# Patient Record
Sex: Female | Born: 1940 | Race: White | Hispanic: No | Smoking: Former smoker
Health system: Southern US, Community
[De-identification: ages and names within clinical notes are randomized; demographics above are authoritative.]

## PROBLEM LIST (undated history)

## (undated) DIAGNOSIS — I639 Cerebral infarction, unspecified: Secondary | ICD-10-CM

## (undated) DIAGNOSIS — Z923 Personal history of irradiation: Secondary | ICD-10-CM

## (undated) DIAGNOSIS — J189 Pneumonia, unspecified organism: Secondary | ICD-10-CM

## (undated) DIAGNOSIS — I10 Essential (primary) hypertension: Secondary | ICD-10-CM

## (undated) DIAGNOSIS — C3490 Malignant neoplasm of unspecified part of unspecified bronchus or lung: Secondary | ICD-10-CM

## (undated) DIAGNOSIS — Z9221 Personal history of antineoplastic chemotherapy: Secondary | ICD-10-CM

## (undated) HISTORY — PX: NASAL SINUS SURGERY: SHX719

## (undated) HISTORY — DX: Personal history of irradiation: Z92.3

## (undated) HISTORY — PX: PORTACATH PLACEMENT: SHX2246

## (undated) HISTORY — DX: Pneumonia, unspecified organism: J18.9

---

## 2000-01-30 ENCOUNTER — Emergency Department (HOSPITAL_COMMUNITY): Admission: EM | Admit: 2000-01-30 | Discharge: 2000-01-30 | Payer: Self-pay

## 2011-04-05 ENCOUNTER — Emergency Department (HOSPITAL_COMMUNITY)
Admission: EM | Admit: 2011-04-05 | Discharge: 2011-04-05 | Disposition: A | Discharge status: Home / Self Care | Payer: No Typology Code available for payment source | Attending: Emergency Medicine | Admitting: Emergency Medicine

## 2011-04-05 ENCOUNTER — Encounter (HOSPITAL_COMMUNITY): Payer: Self-pay | Admitting: *Deleted

## 2011-04-05 ENCOUNTER — Emergency Department (HOSPITAL_COMMUNITY): Payer: No Typology Code available for payment source

## 2011-04-05 DIAGNOSIS — Y9229 Other specified public building as the place of occurrence of the external cause: Secondary | ICD-10-CM | POA: Insufficient documentation

## 2011-04-05 DIAGNOSIS — W010XXA Fall on same level from slipping, tripping and stumbling without subsequent striking against object, initial encounter: Secondary | ICD-10-CM | POA: Insufficient documentation

## 2011-04-05 DIAGNOSIS — F17200 Nicotine dependence, unspecified, uncomplicated: Secondary | ICD-10-CM | POA: Insufficient documentation

## 2011-04-05 DIAGNOSIS — M25539 Pain in unspecified wrist: Secondary | ICD-10-CM | POA: Insufficient documentation

## 2011-04-05 DIAGNOSIS — S62109A Fracture of unspecified carpal bone, unspecified wrist, initial encounter for closed fracture: Secondary | ICD-10-CM | POA: Insufficient documentation

## 2011-04-05 DIAGNOSIS — S62102A Fracture of unspecified carpal bone, left wrist, initial encounter for closed fracture: Secondary | ICD-10-CM

## 2011-04-05 MED ORDER — ACETAMINOPHEN 500 MG PO TABS: 500.0000 mg | ORAL_TABLET | Freq: Four times a day (QID) | ORAL | Status: AC | PRN

## 2011-04-05 MED ORDER — ACETAMINOPHEN 325 MG PO TABS
650.0000 mg | ORAL_TABLET | Freq: Once | ORAL | Status: AC
  Administered 2011-04-05: 650 mg via ORAL
  Filled 2011-04-05: qty 2

## 2011-04-05 NOTE — ED Provider Notes (Signed)
History     CSN: [id number]  Arrival date & time 04/05/11  1249   First MD Initiated Contact with Patient 04/05/11 1402      Chief Complaint  Patient presents with  . Wrist Pain    (Consider location/radiation/quality/duration/timing/severity/associated sxs/prior treatment) HPI  71 year old female presents to the ED with chief complaints of left wrist injury. Patient states she was walking out [employer] today when she slipped and fell. She denies any prior sxs before falling.  Patient extended her left arm to brace her fall. She noticed immediate pain to the left wrist and notice deformity. She denies left elbow left shoulder pain. She denies hand pain. She denies hitting her head or loss of consciousness. She denies any other trauma.  History reviewed. No pertinent past medical history.  History reviewed. No pertinent past surgical history.  No family history on file.  History  Substance Use Topics  . Smoking status: Current Everyday Smoker  . Smokeless tobacco: Not on file  . Alcohol Use: No    OB History    Grav Para Term Preterm Abortions TAB SAB Ect Mult Living                  Review of Systems  All other systems reviewed and are negative.    Allergies  Review of patient's allergies indicates not on file.  Home Medications  No current outpatient prescriptions on file.  BP 155/90  Pulse 84  Temp(Src) 98.2 F (36.8 C) (Oral)  Resp 20  Ht 5\' 2"  (1.575 m)  Wt 122 lb (55.339 kg)  BMI 22.31 kg/m2  SpO2 99%  Physical Exam  Nursing note and vitals reviewed. Constitutional: She appears well-nourished. No distress.  HENT:  Head: Normocephalic and atraumatic.  Eyes: Conjunctivae are normal.  Neck: Neck supple.  Musculoskeletal:       Left wrist: Point tenderness to the radial aspect of wrist with obvious deformity. No evidence of skin tenting. Sensation is intact throughout.  Radial pulse palpable.  Left hand: Normal finger opposition. No tenderness to   Anatomical snuffbox. Sensation is intact throughout.  Normal L elbow and L shoulder.    ED Course  Procedures (including critical care time)  Labs Reviewed - No data to display No results found.   No diagnosis found.  No results found for this or any previous visit. Dg Wrist Complete Left  04/05/2011  *RADIOLOGY REPORT*  Clinical Data: Fall.  Pain and deformity.  LEFT WRIST - COMPLETE 3+ VIEW  Comparison: None.  Findings: There is a Colles' fracture of the distal radius.  There is ventral angulation with slight dorsal tilt of the distal radial articular surface.  There is also a fracture of the posterior lip of the distal radius.  There is a fracture of the ulnar styloid.  IMPRESSION: Colles' fractures of the distal radius and ulnar styloid.  Per CMS PQRS reporting requirements (PQRS Measure 24): Given the patient's age of greater than 50 and the fracture site (hip, distal radius, or spine), the patient should be tested for osteoporosis using DXA, and the appropriate treatment considered based on the DXA results.  Original Report Authenticated By: Thomasenia Sales, M.D.       MDM  Likely wrist fracture from [facility] injury, will obtain xray for further evaluation.  Tylenol given for pain.  No other injury noted.     3:12 PM  X-ray of left wrist shows evidence of a Colles' fractures of the distal radius and ulnar styloid.  Patient is neurovascularly intact. I discussed my attending, and we decided that an ulnar gutter splint is appropriate. Sling provided for comfort. Follow up instruction given. Patient was understanding and agrees with plan.    Fayrene Helper, PA-C 04/05/11 [phone]

## 2011-04-05 NOTE — ED Notes (Signed)
Pt states she was at [employer] and slip when she was coming out of the door. Pt states she is not sure what she fell on. Pt states she tried to catch her self with her wrist. Deformity noted to left wrist. Pt is able to move digits but can not grab anything. Pt has + radial pulse. No loc

## 2011-04-05 NOTE — ED Notes (Signed)
Patient transported to X-ray 

## 2011-04-05 NOTE — ED Notes (Signed)
Ortho called 

## 2011-04-05 NOTE — Discharge Instructions (Signed)
Please followup with Dr. Victorino Dike for further evaluation and management of your wrist fracture. Return sooner if the experiencing numbness to fingers, and uncontrolled pain.  Wrist Fracture Your caregiver has diagnosed you as having a fracture of the wrist. A fracture is a break in the bone or bones. A cast or splint is used to protect and keep your injured bone(s) from moving. The cast or splint will usually be on for about 5 to 6 weeks. One of the bones of the wrist (the navicular bone) often does not show up as a fracture on X-ray until later or in the healing phase. With this bone your caregiver will often cast as though it is fractured even if not seen on the X-ray. HOME CARE INSTRUCTIONS   To lessen the swelling, keep the injured part elevated while sitting or lying down. Keeping the injury above the level of your heart (the center of the chest) will decrease swelling and pain.   Do not wear rings or jewelry on the injured hand or wrist.   Apply ice to the injury for 15 to 20 minutes, 3 to 4 times per day while awake for 2 days. Put the ice in a plastic bag and place a thin towel between the bag of ice and your cast.   If you have a plaster or fiberglass cast:   Do not try to scratch the skin under the cast using sharp or pointed objects.   Check the skin around the cast every day. You may put lotion on any red or sore areas.   Keep your cast dry and clean.   If you have a plaster splint:   Wear the splint as directed.   You may loosen the elastic around the splint if your fingers become numb, tingle, or turn cold or blue.   If you have been put in a removable splint, wear and use as directed.   Do not use powders or deodorants in or around the cast or splint.   Do not remove padding from your cast or splint.   Do not put pressure on any part of your cast or splint. It may break. Rest your cast or splint only on a pillow the first 24 hours until it is fully hardened.   Gently  move your fingers often, so they do not get stiff.   Do not remove the splint unless directed by your caregiver. Casts must be removed by an orthopedist.   Your cast or splint can be protected during bathing with a plastic bag. Do not lower the cast or splint into water.   Only take over-the-counter or prescription medicines for pain, discomfort, or fever as directed by your caregiver.   Follow up with your caregiver as directed.  SEEK IMMEDIATE MEDICAL CARE IF:   Your cast or splint gets damaged or breaks.   Your cast or splint feels too tight or loose.   You have increased pain, not controlled with medication.   You have increased swelling.   Your skin or nails below the injury turn blue or grey or feel cold or numb.   You have trouble moving or feeling your fingers.   You experience any burning or stinging from the cast or splint.   There is a bad smell coming from under the cast or splint.   New stains or fluids are coming from under the cast or splint.   You have any new injuries while wearing the cast or splint.  Document  Released: 10/14/2004 Document Revised: 12/24/2010 Document Reviewed: 08/03/2006 [facility] Patient Information 2012 [place], [place].  Cast or Splint Care Casts and splints support injured limbs and keep bones from moving while they heal.  HOME CARE  Keep the cast or splint uncovered during the drying period.   A plaster cast can take 24 to 48 hours to dry.   A fiberglass cast will dry in less than 1 hour.   Do not rest the cast on anything harder than a pillow for 24 hours.   Do not put weight on your injured limb. Do not put pressure on the cast. Wait for your doctor's approval.   Keep the cast or splint dry.   Cover the cast or splint with a plastic bag during baths or wet weather.   If you have a cast over your chest and belly (trunk), take sponge baths until the cast is taken off.   Keep your cast or splint clean. Wash a dirty cast with a  damp cloth.   Do not put any objects under your cast or splint. Do not scratch the skin under the cast with an object.   Do not take out the padding from inside your cast.   Exercise your joints near the cast as told by your doctor.   Raise (elevate) your injured limb on 1 or 2 pillows for the first 1 to 3 days.  GET HELP RIGHT AWAY IF:  Your cast or splint cracks.   Your cast or splint is too tight or too loose.   You itch badly under the cast.   Your cast gets wet or has a soft spot.   You have a bad smell coming from the cast.   You get an object stuck under the cast.   Your skin around the cast becomes red or raw.   You have new or more pain after the cast is put on.   You have fluid leaking through the cast.   You cannot move your fingers or toes.   Your fingers or toes turn colors or are cool, painful, or puffy (swollen).   You have tingling or lose feeling (numbness) around the injured area.   You have pain or pressure under the cast.   You have trouble breathing or have shortness of breath.   You have chest pain.  MAKE SURE YOU:  Understand these instructions.   Will watch your condition.   Will get help right away if you are not doing well or get worse.  Document Released: 05/06/2010 Document Revised: 12/24/2010 Document Reviewed: 05/06/2010 [facility] Patient Information 2012 [place], [place].

## 2011-04-07 NOTE — ED Provider Notes (Signed)
Medical screening examination/treatment/procedure(s) were performed by non-physician practitioner and as supervising physician I was immediately available for consultation/collaboration.  Farah Benish T Allante Beane, MD 04/07/11 0930 

## 2012-06-23 ENCOUNTER — Institutional Professional Consult (permissible substitution): Payer: Medicare Other | Admitting: Internal Medicine

## 2012-06-27 ENCOUNTER — Ambulatory Visit (INDEPENDENT_AMBULATORY_CARE_PROVIDER_SITE_OTHER): Payer: Medicare Other | Admitting: Internal Medicine

## 2012-06-27 ENCOUNTER — Ambulatory Visit (INDEPENDENT_AMBULATORY_CARE_PROVIDER_SITE_OTHER)
Admission: RE | Admit: 2012-06-27 | Discharge: 2012-06-27 | Disposition: A | Discharge status: Home / Self Care | Payer: Medicare Other | Source: Ambulatory Visit | Attending: Internal Medicine | Admitting: Internal Medicine

## 2012-06-27 ENCOUNTER — Other Ambulatory Visit (INDEPENDENT_AMBULATORY_CARE_PROVIDER_SITE_OTHER): Payer: Medicare Other

## 2012-06-27 ENCOUNTER — Encounter: Payer: Self-pay | Admitting: Internal Medicine

## 2012-06-27 VITALS — BP 120/68 | HR 94 | Temp 97.7°F | Ht 63.0 in | Wt 117.4 lb

## 2012-06-27 DIAGNOSIS — J90 Pleural effusion, not elsewhere classified: Secondary | ICD-10-CM

## 2012-06-27 DIAGNOSIS — R918 Other nonspecific abnormal finding of lung field: Secondary | ICD-10-CM | POA: Insufficient documentation

## 2012-06-27 DIAGNOSIS — R222 Localized swelling, mass and lump, trunk: Secondary | ICD-10-CM

## 2012-06-27 LAB — BASIC METABOLIC PANEL
CO2: 25 mEq/L (ref 19–32)
Calcium: 9.1 mg/dL (ref 8.4–10.5)
Creatinine, Ser: 1 mg/dL (ref 0.4–1.2)
GFR: 55.88 mL/min — ABNORMAL LOW (ref >60.00)
Glucose, Bld: 94 mg/dL (ref 70–99)

## 2012-06-27 NOTE — Progress Notes (Signed)
  Subjective:    Patient ID: Cheryl Nielsen, female    DOB: 1940-07-04  MRN: [id number]  HPI  62 yowf quit smoking 04/2012 referred 06/27/2012 by Dr Leonides Sake for abn cxr   06/27/2012 1st pulmonary eval cc acutely ill last week in May 2014 with generalized cp more ant than post, not really pleuritic,  and sob x one flight of steps with wt loss x 8-10 lbs rx as pna with  levaquin >> nausea but improving.  No fever, no cough at any point.  No dysphagia or back pain  No obvious daytime variabilty or assoc chronic cough or cp or chest tightness, subjective wheeze overt sinus or hb symptoms. No unusual exp hx or h/o childhood pna/ asthma or premature birth to her knowledge.   Sleeping ok without nocturnal  or early am exacerbation  of respiratory  c/o's or need for noct saba. Also denies any obvious fluctuation of symptoms with weather or environmental changes or other aggravating or alleviating factors except as outlined above    Review of Systems  Constitutional: Positive for unexpected weight change. Negative for fever and chills.  HENT: Negative for ear pain, nosebleeds, congestion, sore throat, rhinorrhea, sneezing, trouble swallowing, dental problem, voice change, postnasal drip and sinus pressure.   Eyes: Negative for visual disturbance.  Respiratory: Positive for cough and shortness of breath. Negative for choking.   Cardiovascular: Positive for chest pain. Negative for leg swelling.  Gastrointestinal: Negative for vomiting, abdominal pain and diarrhea.  Genitourinary: Negative for difficulty urinating.  Musculoskeletal: Positive for arthralgias.  Skin: Negative for rash.  Neurological: Negative for tremors, syncope and headaches.  Hematological: Does not bruise/bleed easily.       Objective:   Physical Exam  amb wf nad Wt Readings from Last 3 Encounters:  06/27/12 117 lb 6.4 oz (53.252 kg)  04/05/11 122 lb (55.339 kg)   . HEENT mild turbinate edema.  Oropharynx no thrush or  excess pnd or cobblestoning.  No JVD or cervical adenopathy. Mild accessory muscle hypertrophy. Trachea midline, nl thryroid. Chest was hyperinflated by percussion with diminished breath sounds esp on L with dullness L base and moderate increased exp time without localized  wheeze. Hoover sign positive at mid inspiration. Regular rate and rhythm without murmur gallop or rub or increase P2 or edema.  Abd: no hsm, nl excursion. Ext warm without cyanosis / moderate bilateral clubbing.    CXR  06/27/2012 :   Near-complete opacification of the left hemithorax with mild  mediastinal shift to the left       Assessment & Plan:

## 2012-06-27 NOTE — Assessment & Plan Note (Signed)
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

## 2012-06-27 NOTE — Patient Instructions (Addendum)
Please remember to go to the lab and x-ray department downstairs for your tests - we will call you with the results when they are available.      

## 2012-06-28 ENCOUNTER — Telehealth: Payer: Self-pay | Admitting: Internal Medicine

## 2012-06-28 LAB — SEDIMENTATION RATE: Sed Rate: 24 mm/hr — ABNORMAL HIGH (ref 0–22)

## 2012-06-28 NOTE — Telephone Encounter (Signed)
MW has already discuss results with pt. Will sign off message

## 2012-06-30 ENCOUNTER — Encounter (HOSPITAL_COMMUNITY): Payer: Medicare Other

## 2012-07-04 ENCOUNTER — Encounter (HOSPITAL_COMMUNITY): Payer: Self-pay

## 2012-07-05 ENCOUNTER — Ambulatory Visit (HOSPITAL_COMMUNITY)
Admission: RE | Admit: 2012-07-05 | Discharge: 2012-07-05 | Disposition: A | Discharge status: Home / Self Care | Payer: Medicare Other | Source: Ambulatory Visit | Attending: Internal Medicine | Admitting: Internal Medicine

## 2012-07-05 ENCOUNTER — Encounter (HOSPITAL_COMMUNITY)
Admission: RE | Disposition: A | Discharge status: Home / Self Care | Payer: Self-pay | Source: Ambulatory Visit | Attending: Internal Medicine

## 2012-07-05 DIAGNOSIS — C3490 Malignant neoplasm of unspecified part of unspecified bronchus or lung: Secondary | ICD-10-CM

## 2012-07-05 DIAGNOSIS — R918 Other nonspecific abnormal finding of lung field: Secondary | ICD-10-CM

## 2012-07-05 DIAGNOSIS — R222 Localized swelling, mass and lump, trunk: Secondary | ICD-10-CM

## 2012-07-05 HISTORY — DX: Malignant neoplasm of unspecified part of unspecified bronchus or lung: C34.90

## 2012-07-05 HISTORY — PX: VIDEO BRONCHOSCOPY: SHX5072

## 2012-07-05 SURGERY — VIDEO BRONCHOSCOPY WITHOUT FLUORO
Anesthesia: Moderate Sedation | Laterality: Bilateral

## 2012-07-05 MED ORDER — MIDAZOLAM HCL 10 MG/2ML IJ SOLN
INTRAMUSCULAR | Status: AC
  Filled 2012-07-05: qty 4

## 2012-07-05 MED ORDER — MEPERIDINE HCL 100 MG/ML IJ SOLN
INTRAMUSCULAR | Status: AC
  Filled 2012-07-05: qty 2

## 2012-07-05 MED ORDER — MIDAZOLAM HCL 10 MG/2ML IJ SOLN
INTRAMUSCULAR | Status: DC | PRN
  Administered 2012-07-05: 5 mg via INTRAVENOUS
  Administered 2012-07-05: 2.5 mg via INTRAVENOUS

## 2012-07-05 MED ORDER — PHENYLEPHRINE HCL 0.25 % NA SOLN
NASAL | Status: DC | PRN
  Administered 2012-07-05: 2 via NASAL

## 2012-07-05 MED ORDER — LIDOCAINE HCL 2 % EX GEL
CUTANEOUS | Status: DC | PRN
  Administered 2012-07-05: 1

## 2012-07-05 MED ORDER — PHENYLEPHRINE HCL 0.25 % NA SOLN
1.0000 | Freq: Four times a day (QID) | NASAL | Status: DC | PRN
  Filled 2012-07-05: qty 15

## 2012-07-05 MED ORDER — LIDOCAINE HCL 2 % EX GEL
Freq: Once | CUTANEOUS | Status: DC
  Filled 2012-07-05: qty 5

## 2012-07-05 MED ORDER — MEPERIDINE HCL 25 MG/ML IJ SOLN
INTRAMUSCULAR | Status: DC | PRN
  Administered 2012-07-05: 25 mg via INTRAVENOUS

## 2012-07-05 MED ORDER — LIDOCAINE HCL (PF) 1 % IJ SOLN
INTRAMUSCULAR | Status: DC | PRN
  Administered 2012-07-05: 5 mL

## 2012-07-05 NOTE — OR Nursing (Signed)
Pt is in endoscopy recovery after bronch with Dr. Sherene Sires.  O2 sats were 94% on room air on admission.  Sats are now 88-90% ra in recovery, pt alert, denies difficulty breathing.  Called Dr. Sherene Sires, stated it was okay to send pt home with these o2 sats.  Sats at 91% ra upon discharge.  Ennis Forts, RN

## 2012-07-05 NOTE — H&P (Signed)
  71 yowf quit smoking 04/2012 referred 06/27/2012 by Dr Leonides Sake for abn cxr     06/27/2012 1st pulmonary eval cc acutely ill last week in May 2014 with generalized cp more ant than post, not really pleuritic,  and sob x one flight of steps with wt loss x 8-10 lbs rx as pna with  levaquin >> nausea but improving.   No fever, no cough at any point.  No dysphagia or back pain   No obvious daytime variabilty or assoc chronic cough or cp or chest tightness, subjective wheeze overt sinus or hb symptoms. No unusual exp hx or h/o childhood pna/ asthma or premature birth to her knowledge.    Sleeping ok without nocturnal  or early am exacerbation  of respiratory  c/o's or need for noct saba. Also denies any obvious fluctuation of symptoms with weather or environmental changes or other aggravating or alleviating factors except as outlined above      Review of Systems  Constitutional: Positive for unexpected weight change. Negative for fever and chills.  HENT: Negative for ear pain, nosebleeds, congestion, sore throat, rhinorrhea, sneezing, trouble swallowing, dental problem, voice change, postnasal drip and sinus pressure.   Eyes: Negative for visual disturbance.  Respiratory: Positive for cough and shortness of breath. Negative for choking.   Cardiovascular: Positive for chest pain. Negative for leg swelling.  Gastrointestinal: Negative for vomiting, abdominal pain and diarrhea.  Genitourinary: Negative for difficulty urinating.  Musculoskeletal: Positive for arthralgias.  Skin: Negative for rash.  Neurological: Negative for tremors, syncope and headaches.  Hematological: Does not bruise/bleed easily.          Objective:     Physical Exam   amb wf nad Wt Readings from Last 3 Encounters:   06/27/12  117 lb 6.4 oz (53.252 kg)   04/05/11  122 lb (55.339 kg)     . HEENT mild turbinate edema.  Oropharynx no thrush or excess pnd or cobblestoning.  No JVD or cervical adenopathy.  Mild accessory muscle hypertrophy. Trachea midline, nl thryroid. Chest was hyperinflated by percussion with diminished breath sounds esp on L with dullness L base and moderate increased exp time without localized  wheeze. Hoover sign positive at mid inspiration. Regular rate and rhythm without murmur gallop or rub or increase P2 or edema.  Abd: no hsm, nl excursion. Ext warm without cyanosis / moderate bilateral clubbing.     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   07/05/2012 day of bronchoscopy: no change in hx or exam    Sandrea Hughs, MD Pulmonary and Critical Care Medicine [facility] Cell [phone] After 5:30 PM or weekends, call [phone]

## 2012-07-05 NOTE — Op Note (Signed)
Bronchoscopy Procedure Note  Date of Operation: 07/05/2012   Pre-op Diagnosis: lung mass  Post-op Diagnosis: lung mass  Surgeon: Sandrea Hughs  Anesthesia: Monitored Local Anesthesia with Sedation  Operation: Video Flexible fiberoptic bronchoscopy, diagnostic   Findings: Cobblestoning, narrowing LMSB at carina with 75% obst  Specimen: washings,   Estimated Blood Loss: min  Complications: None  Indications and History: See updated H and P same date. The risks, benefits, complications, treatment options and expected outcomes were discussed with the patient.  The possibilities of reaction to medication, pulmonary aspiration, perforation of a viscus, bleeding, failure to diagnose a condition and creating a complication requiring transfusion or operation were discussed with the patient who freely signed the consent.    Description of Procedure: The patient was re-examined in the bronchoscopy suite. The patient was identified  and the procedure verified as Flexible Fiberoptic Bronchoscopy.  A Time Out was held and the above information confirmed.   After the induction of topical nasopharyngeal anesthesia, the patient was positioned  and the bronchoscope was passed through the R naris. The vocal cords were visualized and  1% buffered lidocaine 5 ml was topically placed onto the cords. The cords were nl. The scope was then passed into the trachea.  1% buffered lidocaine given topically. Airways inspected bilaterally to the subsegmental level with the following findings:  Trachea was nl x at level of carina where there was cobblestoning esp anteriorly assoc with definite widening.  This mucosal abn extendended into the LMSB and obstructed it 75% so that the scope could not easily be passed distally.  The Right sided airways were nl   Procedures: Bronchial washings, LMSB Endobronchial Bxs, LMSB     The Patient was taken to the Endoscopy Recovery area in satisfactory  condition.  Attestation: I performed the procedure.  Sandrea Hughs, MD Pulmonary and Critical Care Medicine [facility] Cell [phone] After 5:30 PM or weekends, call [phone]

## 2012-07-05 NOTE — Progress Notes (Signed)
Bronchoscopy performed with intervention BAL and intervention Biopsy

## 2012-07-06 ENCOUNTER — Telehealth: Payer: Self-pay | Admitting: Internal Medicine

## 2012-07-06 ENCOUNTER — Encounter (HOSPITAL_COMMUNITY): Payer: Self-pay | Admitting: Internal Medicine

## 2012-07-06 NOTE — Telephone Encounter (Signed)
Pt is requesting bronch results. Please advise MW thanks

## 2012-07-06 NOTE — Telephone Encounter (Signed)
Discussed with daughter, prelim is Small cell but final path pending and I will call and arrange onc eval when done

## 2012-07-07 ENCOUNTER — Other Ambulatory Visit: Payer: Self-pay | Admitting: Internal Medicine

## 2012-07-07 ENCOUNTER — Telehealth: Payer: Self-pay | Admitting: Internal Medicine

## 2012-07-07 ENCOUNTER — Encounter: Payer: Self-pay | Admitting: Internal Medicine

## 2012-07-07 ENCOUNTER — Telehealth: Payer: Self-pay | Admitting: *Deleted

## 2012-07-07 DIAGNOSIS — R918 Other nonspecific abnormal finding of lung field: Secondary | ICD-10-CM

## 2012-07-07 NOTE — Telephone Encounter (Signed)
Called left vm message regarding appt 07/10/12 3:30 labs, 4:00 Dr. Arbutus Ped

## 2012-07-07 NOTE — Telephone Encounter (Signed)
I spoke with daughter and she stated MW has already called her. Nothing further was needed

## 2012-07-07 NOTE — Telephone Encounter (Signed)
Pt's daughter returned call. Kathleen W Perdue °

## 2012-07-07 NOTE — Telephone Encounter (Signed)
LMOM x 1 

## 2012-07-10 ENCOUNTER — Encounter: Payer: Self-pay | Admitting: Internal Medicine

## 2012-07-10 ENCOUNTER — Ambulatory Visit (HOSPITAL_BASED_OUTPATIENT_CLINIC_OR_DEPARTMENT_OTHER): Payer: Medicare Other | Admitting: Internal Medicine

## 2012-07-10 ENCOUNTER — Other Ambulatory Visit (HOSPITAL_BASED_OUTPATIENT_CLINIC_OR_DEPARTMENT_OTHER): Payer: Medicare Other | Admitting: Lab

## 2012-07-10 ENCOUNTER — Ambulatory Visit: Payer: Medicare Other

## 2012-07-10 ENCOUNTER — Telehealth: Payer: Self-pay | Admitting: *Deleted

## 2012-07-10 VITALS — BP 155/82 | HR 80 | Temp 97.9°F | Resp 19 | Ht 63.0 in | Wt 114.9 lb

## 2012-07-10 DIAGNOSIS — C3400 Malignant neoplasm of unspecified main bronchus: Secondary | ICD-10-CM

## 2012-07-10 DIAGNOSIS — C3490 Malignant neoplasm of unspecified part of unspecified bronchus or lung: Secondary | ICD-10-CM

## 2012-07-10 LAB — CBC WITH DIFFERENTIAL/PLATELET
Basophils Absolute: 0.1 10*3/uL (ref 0.0–0.1)
Eosinophils Absolute: 0.1 10*3/uL (ref 0.0–0.5)
HCT: 39 % (ref 34.8–46.6)
LYMPH%: 13.9 % — ABNORMAL LOW (ref 14.0–49.7)
MONO#: 0.6 10*3/uL (ref 0.1–0.9)
NEUT#: 6 10*3/uL (ref 1.5–6.5)
NEUT%: 76.8 % (ref 38.4–76.8)
Platelets: 317 10*3/uL (ref 145–400)
WBC: 7.9 10*3/uL (ref 3.9–10.3)

## 2012-07-10 LAB — COMPREHENSIVE METABOLIC PANEL (CC13)
BUN: 12 mg/dL (ref 7.0–26.0)
CO2: 28 mEq/L (ref 22–29)
Creatinine: 0.9 mg/dL (ref 0.6–1.1)
Glucose: 134 mg/dl — ABNORMAL HIGH (ref 70–99)
Total Bilirubin: 0.32 mg/dL (ref 0.20–1.20)
Total Protein: 6.7 g/dL (ref 6.4–8.3)

## 2012-07-10 MED ORDER — LIDOCAINE-PRILOCAINE 2.5-2.5 % EX CREA: TOPICAL_CREAM | CUTANEOUS | Status: DC | PRN

## 2012-07-10 MED ORDER — PROCHLORPERAZINE MALEATE 10 MG PO TABS
10.0000 mg | ORAL_TABLET | Freq: Four times a day (QID) | ORAL | Status: DC | PRN
Start: 2012-07-10 — End: 2012-07-11

## 2012-07-10 NOTE — Progress Notes (Signed)
Checked in new patient. No financial issues. Didn't ask about POA/living will. Wants email/phone/mail for communication .

## 2012-07-10 NOTE — Patient Instructions (Signed)
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 We discussed treatment options including systemic chemotherapy with carboplatin and etoposide. First dose next week.
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
## 2012-07-10 NOTE — Telephone Encounter (Signed)
Pt has appt 6/23 at 330  will close note and send as fyi to dr wert.

## 2012-07-10 NOTE — Telephone Encounter (Signed)
Pt verbalized understanding of appt time and place for today

## 2012-07-11 ENCOUNTER — Telehealth: Payer: Self-pay | Admitting: *Deleted

## 2012-07-11 ENCOUNTER — Telehealth (HOSPITAL_COMMUNITY): Payer: Self-pay | Admitting: *Deleted

## 2012-07-11 ENCOUNTER — Encounter (HOSPITAL_COMMUNITY): Payer: Self-pay | Admitting: Pharmacy Technician

## 2012-07-11 ENCOUNTER — Telehealth: Payer: Self-pay | Admitting: Internal Medicine

## 2012-07-11 ENCOUNTER — Other Ambulatory Visit: Payer: Self-pay | Admitting: Radiology

## 2012-07-11 NOTE — Telephone Encounter (Signed)
Per staff message and POF I have scheduled appts.  JMW  

## 2012-07-11 NOTE — Telephone Encounter (Signed)
s.w. pt daughter and advised on edu class and to pick up sched.Marland KitchenMarland KitchenIR called and sched appt with pt....pt ok and aware...MW added tx.Marland KitchenMarland KitchenMarland Kitchen

## 2012-07-11 NOTE — Telephone Encounter (Signed)
Per staff message I have adjusted appt for 7/1.  JMW

## 2012-07-12 ENCOUNTER — Other Ambulatory Visit: Payer: Medicare Other

## 2012-07-12 ENCOUNTER — Ambulatory Visit (HOSPITAL_COMMUNITY)
Admission: RE | Admit: 2012-07-12 | Discharge: 2012-07-12 | Disposition: A | Discharge status: Home / Self Care | Payer: Medicare Other | Source: Ambulatory Visit | Attending: Internal Medicine | Admitting: Internal Medicine

## 2012-07-12 ENCOUNTER — Other Ambulatory Visit: Payer: Self-pay | Admitting: Internal Medicine

## 2012-07-12 ENCOUNTER — Encounter: Payer: Self-pay | Admitting: *Deleted

## 2012-07-12 ENCOUNTER — Encounter (HOSPITAL_COMMUNITY): Payer: Self-pay

## 2012-07-12 DIAGNOSIS — C3490 Malignant neoplasm of unspecified part of unspecified bronchus or lung: Secondary | ICD-10-CM

## 2012-07-12 MED ORDER — CEFAZOLIN SODIUM-DEXTROSE 2-3 GM-% IV SOLR
2.0000 g | Freq: Once | INTRAVENOUS | Status: AC
  Administered 2012-07-12: 2 g via INTRAVENOUS
  Filled 2012-07-12: qty 50

## 2012-07-12 MED ORDER — ONDANSETRON HCL 4 MG/2ML IJ SOLN
INTRAMUSCULAR | Status: AC
  Filled 2012-07-12: qty 2

## 2012-07-12 MED ORDER — SODIUM CHLORIDE 0.9 % IV SOLN
INTRAVENOUS | Status: DC
  Administered 2012-07-12: 12:00:00 via INTRAVENOUS

## 2012-07-12 MED ORDER — MIDAZOLAM HCL 2 MG/2ML IJ SOLN
INTRAMUSCULAR | Status: AC
  Filled 2012-07-12: qty 6

## 2012-07-12 MED ORDER — PROCHLORPERAZINE EDISYLATE 5 MG/ML IJ SOLN
5.0000 mg | Freq: Once | INTRAMUSCULAR | Status: AC
  Administered 2012-07-12: 5 mg via INTRAVENOUS
  Filled 2012-07-12: qty 1

## 2012-07-12 MED ORDER — FENTANYL CITRATE 0.05 MG/ML IJ SOLN
INTRAMUSCULAR | Status: AC | PRN
  Administered 2012-07-12: 50 ug via INTRAVENOUS
  Administered 2012-07-12: 25 ug via INTRAVENOUS

## 2012-07-12 MED ORDER — FENTANYL CITRATE 0.05 MG/ML IJ SOLN
INTRAMUSCULAR | Status: AC
  Filled 2012-07-12: qty 6

## 2012-07-12 MED ORDER — MIDAZOLAM HCL 2 MG/2ML IJ SOLN
INTRAMUSCULAR | Status: AC | PRN
  Administered 2012-07-12: 0.5 mg via INTRAVENOUS
  Administered 2012-07-12: 1 mg via INTRAVENOUS

## 2012-07-12 NOTE — Procedures (Signed)
Successful placement of right IJ approach port-a-cath with tip at the superior caval atrial junction. The catheter is ready for immediate use. No immediate post procedural complications. 

## 2012-07-12 NOTE — H&P (Signed)
Cheryl Nielsen is an 72 y.o. female.   Chief Complaint: New dx lung ca Scheduled for port a cath placement  HPI: smoker- quit recently  Past Medical History  Diagnosis Date  . Pneumonia, organism unspecified     Past Surgical History  Procedure Laterality Date  . Nasal sinus surgery    . Video bronchoscopy Bilateral 07/05/2012    Procedure: VIDEO BRONCHOSCOPY WITHOUT FLUORO;  Surgeon: Nyoka Cowden, MD;  Location: [facility];  Service: Cardiopulmonary;  Laterality: Bilateral;    Family History  Problem Relation Age of Onset  . Heart disease Mother   . Heart disease Father   . Prostate cancer Father   . Lung cancer Mother     was a smoker   Social History:  reports that she quit smoking about 2 months ago. Her smoking use included Cigarettes. She has a 40 pack-year smoking history. She has never used smokeless tobacco. She reports that she does not drink alcohol or use illicit drugs.  Allergies:  Allergies  Allergen Reactions  . Asa (Aspirin)     GI upset  . Codeine     syncope     (Not in a hospital admission)  Results for orders placed in visit on 07/10/12 (from the past 48 hour(s))  CBC WITH DIFFERENTIAL     Status: Abnormal   Collection Time    07/10/12  3:44 PM      Result Value Range   WBC 7.9  3.9 - 10.3 10e3/uL   NEUT# 6.0  1.5 - 6.5 10e3/uL   HGB 13.0  11.6 - 15.9 g/dL   HCT 16.1  09.6 - 04.5 %   Platelets 317  145 - 400 10e3/uL   MCV 80.0  79.5 - 101.0 fL   MCH 26.6  25.1 - 34.0 pg   MCHC 33.3  31.5 - 36.0 g/dL   RBC 4.09  8.11 - 9.14 10e6/uL   RDW 15.1 (*) 11.2 - 14.5 %   lymph# 1.1  0.9 - 3.3 10e3/uL   MONO# 0.6  0.1 - 0.9 10e3/uL   Eosinophils Absolute 0.1  0.0 - 0.5 10e3/uL   Basophils Absolute 0.1  0.0 - 0.1 10e3/uL   NEUT% 76.8  38.4 - 76.8 %   LYMPH% 13.9 (*) 14.0 - 49.7 %   MONO% 7.8  0.0 - 14.0 %   EOS% 0.7  0.0 - 7.0 %   BASO% 0.8  0.0 - 2.0 %  COMPREHENSIVE METABOLIC PANEL (CC13)     Status: Abnormal   Collection Time    07/10/12   3:44 PM      Result Value Range   Sodium 130 (*) 136 - 145 mEq/L   Potassium 4.0  3.5 - 5.1 mEq/L   Chloride 93 (*) 98 - 107 mEq/L   CO2 28  22 - 29 mEq/L   Glucose 134 (*) 70 - 99 mg/dl   BUN 78.2  7.0 - 95.6 mg/dL   Creatinine 0.9  0.6 - 1.1 mg/dL   Total Bilirubin 2.13  0.20 - 1.20 mg/dL   Alkaline Phosphatase 94  40 - 150 U/L   AST 15  5 - 34 U/L   ALT 13  0 - 55 U/L   Total Protein 6.7  6.4 - 8.3 g/dL   Albumin 3.0 (*) 3.5 - 5.0 g/dL   Calcium 9.1  8.4 - 08.6 mg/dL   No results found.  Review of Systems  Constitutional: Positive for weight loss. Negative for fever.  Respiratory: Positive for shortness of breath.   Cardiovascular: Negative for chest pain.  Gastrointestinal: Negative for nausea, vomiting and abdominal pain.  Neurological: Negative for weakness and headaches.    There were no vitals taken for this visit. Physical Exam  Constitutional: She is oriented to person, place, and time.  Cardiovascular: Normal rate, regular rhythm and normal heart sounds.   No murmur heard. Respiratory: Effort normal and breath sounds normal. She has no wheezes.  GI: Soft. Bowel sounds are normal. There is no tenderness.  Musculoskeletal: Normal range of motion.  Neurological: She is alert and oriented to person, place, and time.  Skin: Skin is warm and dry.  Psychiatric: She has a normal mood and affect. Her behavior is normal. Judgment and thought content normal.     Assessment/Plan New dx lung ca Scheduled for [facility] placement Pt aware of procedure benefits and risks and agreeable to proceed Consent signed and in chart  Cheryl Nielsen A 07/12/2012, 12:46 PM

## 2012-07-14 ENCOUNTER — Encounter (HOSPITAL_COMMUNITY): Payer: Self-pay

## 2012-07-14 ENCOUNTER — Ambulatory Visit (HOSPITAL_COMMUNITY)
Admission: RE | Admit: 2012-07-14 | Discharge: 2012-07-14 | Disposition: A | Discharge status: Home / Self Care | Payer: Medicare Other | Source: Ambulatory Visit | Attending: Internal Medicine | Admitting: Internal Medicine

## 2012-07-14 ENCOUNTER — Telehealth: Payer: Self-pay | Admitting: Medical Oncology

## 2012-07-14 DIAGNOSIS — J438 Other emphysema: Secondary | ICD-10-CM | POA: Insufficient documentation

## 2012-07-14 DIAGNOSIS — E279 Disorder of adrenal gland, unspecified: Secondary | ICD-10-CM | POA: Insufficient documentation

## 2012-07-14 DIAGNOSIS — I319 Disease of pericardium, unspecified: Secondary | ICD-10-CM | POA: Insufficient documentation

## 2012-07-14 DIAGNOSIS — C7931 Secondary malignant neoplasm of brain: Secondary | ICD-10-CM | POA: Insufficient documentation

## 2012-07-14 DIAGNOSIS — C3490 Malignant neoplasm of unspecified part of unspecified bronchus or lung: Secondary | ICD-10-CM | POA: Insufficient documentation

## 2012-07-14 DIAGNOSIS — J90 Pleural effusion, not elsewhere classified: Secondary | ICD-10-CM | POA: Insufficient documentation

## 2012-07-14 DIAGNOSIS — C7949 Secondary malignant neoplasm of other parts of nervous system: Secondary | ICD-10-CM | POA: Insufficient documentation

## 2012-07-14 DIAGNOSIS — N281 Cyst of kidney, acquired: Secondary | ICD-10-CM | POA: Insufficient documentation

## 2012-07-14 DIAGNOSIS — R599 Enlarged lymph nodes, unspecified: Secondary | ICD-10-CM | POA: Insufficient documentation

## 2012-07-14 DIAGNOSIS — C787 Secondary malignant neoplasm of liver and intrahepatic bile duct: Secondary | ICD-10-CM | POA: Insufficient documentation

## 2012-07-14 DIAGNOSIS — M899 Disorder of bone, unspecified: Secondary | ICD-10-CM | POA: Insufficient documentation

## 2012-07-14 DIAGNOSIS — J392 Other diseases of pharynx: Secondary | ICD-10-CM | POA: Insufficient documentation

## 2012-07-14 DIAGNOSIS — G936 Cerebral edema: Secondary | ICD-10-CM | POA: Insufficient documentation

## 2012-07-14 MED ORDER — IOHEXOL 300 MG/ML  SOLN
80.0000 mL | Freq: Once | INTRAMUSCULAR | Status: AC | PRN
  Administered 2012-07-14: 80 mL via INTRAVENOUS

## 2012-07-14 MED ORDER — GADOBENATE DIMEGLUMINE 529 MG/ML IV SOLN
10.0000 mL | Freq: Once | INTRAVENOUS | Status: AC | PRN
  Administered 2012-07-14: 10 mL via INTRAVENOUS

## 2012-07-14 NOTE — Telephone Encounter (Signed)
Daughter called to report pt is having scans tonight and wants Dr Arbutus Ped to call the results to her on Monday. I told [name] that if there are critical results that the pt may get a call from the on call Provider. [name] said to please call her with the results and not her mom -I confirmed this with the patient .

## 2012-07-17 ENCOUNTER — Telehealth: Payer: Self-pay | Admitting: *Deleted

## 2012-07-17 NOTE — Telephone Encounter (Signed)
Pt's daughter Andreas Blower called wanting to know the MRI of the brain and CT results.  Informed Dr Donnald Garre, per Dr Donnald Garre, he will call [name] with the results.  SLJ

## 2012-07-18 ENCOUNTER — Encounter: Payer: Self-pay | Admitting: Internal Medicine

## 2012-07-18 ENCOUNTER — Other Ambulatory Visit (HOSPITAL_BASED_OUTPATIENT_CLINIC_OR_DEPARTMENT_OTHER): Payer: Medicare Other | Admitting: Lab

## 2012-07-18 ENCOUNTER — Telehealth: Payer: Self-pay | Admitting: *Deleted

## 2012-07-18 ENCOUNTER — Telehealth: Payer: Self-pay | Admitting: Internal Medicine

## 2012-07-18 ENCOUNTER — Encounter: Payer: Self-pay | Admitting: Radiation Oncology

## 2012-07-18 ENCOUNTER — Ambulatory Visit (HOSPITAL_BASED_OUTPATIENT_CLINIC_OR_DEPARTMENT_OTHER): Payer: Medicare Other | Admitting: Internal Medicine

## 2012-07-18 ENCOUNTER — Ambulatory Visit (HOSPITAL_BASED_OUTPATIENT_CLINIC_OR_DEPARTMENT_OTHER): Payer: Medicare Other

## 2012-07-18 VITALS — BP 149/86 | HR 85 | Temp 97.2°F | Resp 20 | Ht 62.0 in | Wt 113.2 lb

## 2012-07-18 DIAGNOSIS — C3490 Malignant neoplasm of unspecified part of unspecified bronchus or lung: Secondary | ICD-10-CM

## 2012-07-18 DIAGNOSIS — C787 Secondary malignant neoplasm of liver and intrahepatic bile duct: Secondary | ICD-10-CM

## 2012-07-18 DIAGNOSIS — Z5111 Encounter for antineoplastic chemotherapy: Secondary | ICD-10-CM

## 2012-07-18 DIAGNOSIS — C7949 Secondary malignant neoplasm of other parts of nervous system: Secondary | ICD-10-CM

## 2012-07-18 DIAGNOSIS — C7931 Secondary malignant neoplasm of brain: Secondary | ICD-10-CM

## 2012-07-18 DIAGNOSIS — C3492 Malignant neoplasm of unspecified part of left bronchus or lung: Secondary | ICD-10-CM

## 2012-07-18 DIAGNOSIS — J90 Pleural effusion, not elsewhere classified: Secondary | ICD-10-CM

## 2012-07-18 LAB — CBC WITH DIFFERENTIAL/PLATELET
Eosinophils Absolute: 0.1 10*3/uL (ref 0.0–0.5)
LYMPH%: 17.1 % (ref 14.0–49.7)
MONO#: 1 10*3/uL — ABNORMAL HIGH (ref 0.1–0.9)
NEUT#: 6.4 10*3/uL (ref 1.5–6.5)
Platelets: 250 10*3/uL (ref 145–400)
RBC: 5.22 10*6/uL (ref 3.70–5.45)
WBC: 9.1 10*3/uL (ref 3.9–10.3)
nRBC: 0 % (ref 0–0)

## 2012-07-18 LAB — COMPREHENSIVE METABOLIC PANEL (CC13)
ALT: 12 U/L (ref 0–55)
CO2: 28 mEq/L (ref 22–29)
Calcium: 9.1 mg/dL (ref 8.4–10.4)
Chloride: 95 mEq/L — ABNORMAL LOW (ref 98–109)
Creatinine: 0.8 mg/dL (ref 0.6–1.1)
Sodium: 132 mEq/L — ABNORMAL LOW (ref 136–145)
Total Protein: 6.8 g/dL (ref 6.4–8.3)

## 2012-07-18 MED ORDER — ONDANSETRON 16 MG/50ML IVPB (CHCC)
16.0000 mg | Freq: Once | INTRAVENOUS | Status: AC
  Administered 2012-07-18: 16 mg via INTRAVENOUS

## 2012-07-18 MED ORDER — HEPARIN SOD (PORK) LOCK FLUSH 100 UNIT/ML IV SOLN
500.0000 [IU] | Freq: Once | INTRAVENOUS | Status: AC | PRN
  Administered 2012-07-18: 500 [IU]
  Filled 2012-07-18: qty 5

## 2012-07-18 MED ORDER — SODIUM CHLORIDE 0.9 % IV SOLN
357.5000 mg | Freq: Once | INTRAVENOUS | Status: AC
  Administered 2012-07-18: 360 mg via INTRAVENOUS
  Filled 2012-07-18: qty 36

## 2012-07-18 MED ORDER — SODIUM CHLORIDE 0.9 % IV SOLN
Freq: Once | INTRAVENOUS | Status: AC
  Administered 2012-07-18: 10:00:00 via INTRAVENOUS

## 2012-07-18 MED ORDER — SODIUM CHLORIDE 0.9 % IJ SOLN
10.0000 mL | INTRAMUSCULAR | Status: DC | PRN
  Administered 2012-07-18: 10 mL
  Filled 2012-07-18: qty 10

## 2012-07-18 MED ORDER — ETOPOSIDE CHEMO INJECTION 1 GM/50ML
120.0000 mg/m2 | Freq: Once | INTRAVENOUS | Status: AC
  Administered 2012-07-18: 180 mg via INTRAVENOUS
  Filled 2012-07-18: qty 9

## 2012-07-18 MED ORDER — DEXAMETHASONE SODIUM PHOSPHATE 20 MG/5ML IJ SOLN
20.0000 mg | Freq: Once | INTRAMUSCULAR | Status: AC
  Administered 2012-07-18: 20 mg via INTRAVENOUS

## 2012-07-18 NOTE — Telephone Encounter (Signed)
Per staff phone call and POF I have schedueld appts.  JMW  

## 2012-07-18 NOTE — Progress Notes (Signed)
Put mother's fmla form on nurse's desk. °

## 2012-07-18 NOTE — Patient Instructions (Addendum)
[facility] Discharge Instructions for Patients Receiving Chemotherapy  Today you received the following chemotherapy agents Carboplatin/Etoposide.   To help prevent nausea and vomiting after your treatment, we encourage you to take your nausea medication as directed.    If you develop nausea and vomiting that is not controlled by your nausea medication, call the clinic.   BELOW ARE SYMPTOMS THAT SHOULD BE REPORTED IMMEDIATELY:  *FEVER GREATER THAN 100.5 F  *CHILLS WITH OR WITHOUT FEVER  NAUSEA AND VOMITING THAT IS NOT CONTROLLED WITH YOUR NAUSEA MEDICATION  *UNUSUAL SHORTNESS OF BREATH  *UNUSUAL BRUISING OR BLEEDING  TENDERNESS IN MOUTH AND THROAT WITH OR WITHOUT PRESENCE OF ULCERS  *URINARY PROBLEMS  *BOWEL PROBLEMS  UNUSUAL RASH Items with * indicate a potential emergency and should be followed up as soon as possible.  Feel free to call the clinic you have any questions or concerns. The clinic phone number is [phone].    

## 2012-07-18 NOTE — Telephone Encounter (Signed)
FMLA paperwork for daughter Andreas Blower given to Axel Filler in medical records to complete.

## 2012-07-18 NOTE — Progress Notes (Signed)
Thoracic Location of Tumor / Histology: Left Mainstem Bronchus   Patient presented: cough,chest tightness and congestion x 6 months   Biopsies of Left Mainstem Bronchus (if applicable) revealed: See Below  Endobronchial biopsy, L MSB - SMALL CELL CARCINOMA OF LUNG PRIMARY. Microscopic Comment The tumor cells are strongly positive for TTF-1, CK AE1/AE3, chromogranin and synaptophysin, weakly positive for p63 and negative for CK 5/6 with appropriate controls. The overall findings are consistent with small cell carcinoma of lung primary. Case was discussed with Dr. Sherene Sires on 07-06-2012. Dr. Raynald Blend agrees  Tobacco/Marijuana/Snuff/ETOH use: She has a history of smoking one pack per day for around 55 years and quit 2 months ago. She has no history of alcohol or drug abuse.   Past/Anticipated interventions by cardiothoracic surgery, if any: Endobronchial biopsy  Past/Anticipated interventions by medical oncology, if any: Carboplatin for AUC of 5 on day 1 and Etoposide at 120 mg/m2 on days 1, 2 and 3 with Neulasta support on day 4. Start Date 07/18/12 Hold on proceeding with whole brain radiation for now until the patient complete at least 1-2 cycles of chemotherapy to her agressive systemic disease.  Has Six brain metastases identified, the largest in the right parietal subcortical white matter measuring 10 mm with mild vasogenic edema    Signs/Symptoms  Weight changes, if any: 10 lb weight loss - past 6 months as of 07/10/12  Respiratory complaints, if any:SOB  Hemoptysis, if any: None  Pain issues, if any:  Chest tightness/pain, but notes relief since starting chemotherapy on yesterday  SAFETY ISSUES:  Prior radiation? No  Pacemaker/ICD? No  Possible current pregnancy? No  Is the patient on methotrexate? 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

## 2012-07-18 NOTE — Progress Notes (Signed)
[facility] Telephone:([phone]) [phone]   Fax:([phone]) [phone]  OFFICE PROGRESS NOTE  No PCP Per Patient [address] [place] [address]  DIAGNOSIS: Extensive stage small cell lung cancer with large obstructing Center left lung mass  Was {effusion, liver and brain metastasis diagnosed in June of 2014  PRIOR THERAPY: None  CURRENT THERAPY: Systemic chemotherapy with carboplatin for AUC of 5 on day 1 and etoposide 120 mg/M2 on days 1, 2 and 3 with Neulasta support on day 4. First  cycle starts today 07/18/2012.   INTERVAL HISTORY: Cheryl Nielsen 72 y.o. female returns to the clinic today for  followup visit accompanied her daughter Cheryl Nielsen. Patient is feeling fine today except for the shortness of breath at baseline and increased with exertion. She had CT scan of the chest, abdomen and pelvis as well as MRI of the brain performed recently and she is here for evaluation and discussion of her scan results before starting the first cycle of her chemotherapy. She denied having any significant nausea or vomiting. She denied having any weight loss or night sweats. The patient also had a Port-A-Cath placed by interventional radiology.  MEDICAL HISTORY: Past Medical History  Diagnosis Date  . Pneumonia, organism unspecified     ALLERGIES:  is allergic to asa and codeine.  MEDICATIONS:  Current Outpatient Prescriptions  Medication Sig Dispense Refill  . acetaminophen (TYLENOL) 500 MG tablet Take 1,000 mg by mouth every 6 (six) hours as needed (For headache.).      Marland Kitchen glucosamine-chondroitin 500-400 MG tablet Take 1 tablet by mouth daily with lunch.       . lidocaine-prilocaine (EMLA) cream Apply 1 application topically daily as needed (Applies to port-a-cath.).      Marland Kitchen Multiple Vitamin (MULTIVITAMIN WITH MINERALS) TABS Take 1 tablet by mouth daily with lunch. She takes Surveyor, quantity Adult 50+.      . ranitidine (ZANTAC) 150 MG tablet Take 150 mg by mouth daily as needed for  heartburn.       . ondansetron (ZOFRAN) 4 MG tablet Take 4 mg by mouth every 4 (four) hours as needed for nausea.      . prochlorperazine (COMPAZINE) 10 MG tablet Take 10 mg by mouth every 6 (six) hours as needed (For nausea.).       No current facility-administered medications for this visit.    SURGICAL HISTORY:  Past Surgical History  Procedure Laterality Date  . Nasal sinus surgery    . Video bronchoscopy Bilateral 07/05/2012    Procedure: VIDEO BRONCHOSCOPY WITHOUT FLUORO;  Surgeon: Nyoka Cowden, MD;  Location: [facility];  Service: Cardiopulmonary;  Laterality: Bilateral;    REVIEW OF SYSTEMS:  A comprehensive review of systems was negative except for: Constitutional: positive for fatigue Respiratory: positive for dyspnea on exertion Gastrointestinal: positive for nausea   PHYSICAL EXAMINATION: General appearance: alert, cooperative, fatigued and no distress Head: Normocephalic, without obvious abnormality, atraumatic Neck: no adenopathy Lymph nodes: Cervical, supraclavicular, and axillary nodes normal. Resp: diminished breath sounds LLL and LUL and dullness to percussion LLL and LUL Cardio: regular rate and rhythm, S1, S2 normal, no murmur, click, rub or gallop GI: soft, non-tender; bowel sounds normal; no masses,  no organomegaly Extremities: extremities normal, atraumatic, no cyanosis or edema Neurologic: Alert and oriented X 3, normal strength and tone. Normal symmetric reflexes. Normal coordination and gait  ECOG PERFORMANCE STATUS: 1 - Symptomatic but completely ambulatory  Blood pressure 149/86, pulse 85, temperature 97.2 F (  36.2 C), temperature source Oral, resp. rate 20, height 5\' 2"  (1.575 m), weight 113 lb 3.2 oz (51.347 kg).  LABORATORY DATA: Lab Results  Component Value Date   WBC 9.1 07/18/2012   HGB 13.7 07/18/2012   HCT 42.4 07/18/2012   MCV 81.2 07/18/2012   PLT 250 07/18/2012      Chemistry      Component Value Date/Time   NA 130* 07/10/2012 1544   NA  133* 06/27/2012 1706   K 4.0 07/10/2012 1544   K 4.5 06/27/2012 1706   CL 93* 07/10/2012 1544   CL 95* 06/27/2012 1706   CO2 28 07/10/2012 1544   CO2 25 06/27/2012 1706   BUN 12.0 07/10/2012 1544   BUN 14 06/27/2012 1706   CREATININE 0.9 07/10/2012 1544   CREATININE 1.0 06/27/2012 1706      Component Value Date/Time   CALCIUM 9.1 07/10/2012 1544   CALCIUM 9.1 06/27/2012 1706   ALKPHOS 94 07/10/2012 1544   AST 15 07/10/2012 1544   ALT 13 07/10/2012 1544   BILITOT 0.32 07/10/2012 1544       RADIOGRAPHIC STUDIES: Dg Chest 2 View  06/27/2012   *RADIOLOGY REPORT*  Clinical Data: Follow up pneumonia  CHEST - 2 VIEW  Comparison: None.  Findings: The right lung is well aerated and hyperexpanded without focal abnormality.  There is near-complete opacification of the left lung likely a combination of consolidation and pleural fluid. Mild mediastinal shift to the left is noted.  The osseous structures are within normal limits.  IMPRESSION: Near-complete opacification of the left hemithorax with mild mediastinal shift to the left   Original Report Authenticated By: Alcide Clever, M.D.   Ct Chest W Contrast  07/14/2012   *RADIOLOGY REPORT*  Clinical Data:  New diagnosis of lung cancer.  CT CHEST, ABDOMEN AND PELVIS WITH CONTRAST  Technique:  Multidetector CT imaging of the chest, abdomen and pelvis was performed following the standard protocol during bolus administration of intravenous contrast.  Contrast: 80mL OMNIPAQUE IOHEXOL 300 MG/ML  SOLN  Comparison:  06/27/2012  CT CHEST  Findings:  Large infiltrating left hilar tumor invades the mediastinum and is difficult to differentiate from the heterogeneous "drowned lung" appearance of the left lung which is somewhat heterogeneous.  The mass measures about 8.5 cm anterior - posterior, up to 8 cm transverse, and extends around the circumference of the left pulmonary artery, into the AP window, and extends around the lower trachea were is confluent with subcarinal  pathologic adenopathy with short axis diameter 2.3 cm.  Narrowing of the left pulmonary artery may be due to the lack of aeration in the left lung.  Branching low density in the left lung is primarily thought to be due to plugged airways although there may be satellite tumors in the left lung as well.  Left pleural effusion observed, likely malignant with lateral nodularity along the pleural surface.  There is a pericardial effusion which likewise may be malignant given the degree of mediastinal invasion.  Left mainstem bronchus is abruptly truncated by tumor and frothy material  Mildly prominent right hilar lymph node 1.1 cm.  Prevascular nodes include a 1.6 cm short axis node on image 14 of series 2 and a 1.2 cm node on image 15 of series 2  Emphysema noted. 0.9 x 0.5 cm centrally calcified nodule in the apical segment right upper lobe, image 11 of series 4.  4 mm ground- glass densities subpleural nodule right lower lobe, image 34 of series 4.  Multiple nodules in the 2-3 mm range are present in the right lung.  Rounded density in the right proximal humeral head measuring 9 mm in diameter could conceivably represent a small metastatic lesion.  IMPRESSION:  1.  Large left hilar mass with extensive mediastinal invasion and invasion into adjacent left upper lobe and lower lobe. Pulmonary extent of mass is indistinct due to similar density with the "drowned lung" adjacent to it.  Left pleural effusion, likely malignant.  Pericardial effusion, possibly malignant. Mediastinal adenopathy and mild right hilar adenopathy observed. 2.  Emphysema. 3.  Possible small metastatic lesion in the right proximal humeral head.  CT ABDOMEN AND PELVIS  Findings:  Various metastatic lesions are scattered throughout the liver.  An index lesion in segment 6 measures 3.3 x 2.4 cm on image 68 of series 2.  Over 20 lesions are present in the liver.  Heterogeneous splenic enhancement is attributed to arterial phase of contrast.  2.9 x 1.9  cm mass in the left adrenal gland, likely metastatic.  2.8 x 2.0 cm mass in the right adrenal gland, likely metastatic.  Scattered small just aortic nodes at the level of the hiatus.  Several small renal cysts are present bilaterally.  The pancreas unremarkable.  Portal vein and splenic vein patent.  Fluid density left inguinal lesion, potentially a hydrocele and inguinal hernia or ganglion cyst.  Urinary bladder unremarkable. The uterus and adnexa appear unremarkable.  The appendix unremarkable.  IMPRESSION:  1.  Scattered metastatic lesions throughout the liver. 2.  Bilateral adrenal masses likely represent metastatic disease. Scattered small periaortic nodes at the aortic hiatus, likely malignant. 3.  Small hydrocele or ganglion cyst in the left inguinal region.   Original Report Authenticated By: Gaylyn Rong, M.D.   Mr Laqueta Jean Wo Contrast  07/15/2012   *RADIOLOGY REPORT*  Clinical Data: The diagnosis of lung cancer.  Staging.  Nausea.  MRI HEAD WITHOUT AND WITH CONTRAST  Technique:  Multiplanar, multiecho pulse sequences of the brain and surrounding structures were obtained according to standard protocol without and with intravenous contrast  Contrast: 10mL MULTIHANCE GADOBENATE DIMEGLUMINE 529 MG/ML IV SOLN  Comparison: None.  Findings: I can identify six metastatic lesions within the brain. The largest lesion is 810 mm metastasis in the subcortical white matter of the right parietal region near the vertex.  There is mild vasogenic edema.  There is a 4 mm metastasis in the left frontal lobe without edema.  There are four cerebellar metastases, the largest in the right cerebellum laterally measuring 5 mm.  No evidence of ischemic infarction.  No evidence of hemorrhage, hydrocephalus or extra-axial collection.  No pituitary mass.  No inflammatory sinus disease.  No skull or skull base lesion. Incidental small nasopharyngeal cyst noted.  IMPRESSION: Six brain metastases identified, the largest in the  right parietal subcortical white matter measuring 10 mm with mild vasogenic edema. See above for full discussion.   Original Report Authenticated By: Paulina Fusi, M.D.   Ct Abdomen Pelvis W Contrast  07/14/2012   *RADIOLOGY REPORT*  Clinical Data:  New diagnosis of lung cancer.  CT CHEST, ABDOMEN AND PELVIS WITH CONTRAST  Technique:  Multidetector CT imaging of the chest, abdomen and pelvis was performed following the standard protocol during bolus administration of intravenous contrast.  Contrast: 80mL OMNIPAQUE IOHEXOL 300 MG/ML  SOLN  Comparison:  06/27/2012  CT CHEST  Findings:  Large infiltrating left hilar tumor invades the mediastinum and is difficult to differentiate from the heterogeneous "drowned lung"  appearance of the left lung which is somewhat heterogeneous.  The mass measures about 8.5 cm anterior - posterior, up to 8 cm transverse, and extends around the circumference of the left pulmonary artery, into the AP window, and extends around the lower trachea were is confluent with subcarinal pathologic adenopathy with short axis diameter 2.3 cm.  Narrowing of the left pulmonary artery may be due to the lack of aeration in the left lung.  Branching low density in the left lung is primarily thought to be due to plugged airways although there may be satellite tumors in the left lung as well.  Left pleural effusion observed, likely malignant with lateral nodularity along the pleural surface.  There is a pericardial effusion which likewise may be malignant given the degree of mediastinal invasion.  Left mainstem bronchus is abruptly truncated by tumor and frothy material  Mildly prominent right hilar lymph node 1.1 cm.  Prevascular nodes include a 1.6 cm short axis node on image 14 of series 2 and a 1.2 cm node on image 15 of series 2  Emphysema noted. 0.9 x 0.5 cm centrally calcified nodule in the apical segment right upper lobe, image 11 of series 4.  4 mm ground- glass densities subpleural nodule right  lower lobe, image 34 of series 4.  Multiple nodules in the 2-3 mm range are present in the right lung.  Rounded density in the right proximal humeral head measuring 9 mm in diameter could conceivably represent a small metastatic lesion.  IMPRESSION:  1.  Large left hilar mass with extensive mediastinal invasion and invasion into adjacent left upper lobe and lower lobe. Pulmonary extent of mass is indistinct due to similar density with the "drowned lung" adjacent to it.  Left pleural effusion, likely malignant.  Pericardial effusion, possibly malignant. Mediastinal adenopathy and mild right hilar adenopathy observed. 2.  Emphysema. 3.  Possible small metastatic lesion in the right proximal humeral head.  CT ABDOMEN AND PELVIS  Findings:  Various metastatic lesions are scattered throughout the liver.  An index lesion in segment 6 measures 3.3 x 2.4 cm on image 68 of series 2.  Over 20 lesions are present in the liver.  Heterogeneous splenic enhancement is attributed to arterial phase of contrast.  2.9 x 1.9 cm mass in the left adrenal gland, likely metastatic.  2.8 x 2.0 cm mass in the right adrenal gland, likely metastatic.  Scattered small just aortic nodes at the level of the hiatus.  Several small renal cysts are present bilaterally.  The pancreas unremarkable.  Portal vein and splenic vein patent.  Fluid density left inguinal lesion, potentially a hydrocele and inguinal hernia or ganglion cyst.  Urinary bladder unremarkable. The uterus and adnexa appear unremarkable.  The appendix unremarkable.  IMPRESSION:  1.  Scattered metastatic lesions throughout the liver. 2.  Bilateral adrenal masses likely represent metastatic disease. Scattered small periaortic nodes at the aortic hiatus, likely malignant. 3.  Small hydrocele or ganglion cyst in the left inguinal region.   Original Report Authenticated By: Gaylyn Rong, M.D.   Ir Fluoro Guide Cv Line Right  07/12/2012   *RADIOLOGY REPORT*  Indication: History of  lung cancer, in need of intravenous access for chemotherapy administration  IMPLANTED PORT A CATH PLACEMENT WITH ULTRASOUND AND FLUOROSCOPIC GUIDANCE  Comparison: Chest radiograph - 06/27/2012  Sedation: Versed 1.5 mg IV; Fentanyl 75 mcg IV; Zofran 4 mg IV; Ancef 2 gm IV; IV antibiotic was given in an appropriate time interval prior to skin puncture.  Total Moderate Sedation Time: 25 minutes.  Contrast: None  Fluoroscopy Time: 30 seconds  Complications: None immediate  Procedure:  The procedure, risks, benefits, and alternatives were explained to the patient.  Questions regarding the procedure were encouraged and answered.  The patient understands and consents to the procedure.  The right neck and chest were prepped with chlorhexidine in a sterile fashion, and a sterile drape was applied covering the operative field.  Maximum barrier sterile technique with sterile gowns and gloves were used for the procedure.  A timeout was performed prior to the initiation of the procedure.  Local anesthesia was provided with 1% lidocaine with epinephrine.  After creating a small venotomy incision, a micropuncture kit was utilized to access the right internal jugular vein under direct, real-time ultrasound guidance.  Ultrasound image documentation was performed.  The microwire was kinked to measure appropriate catheter length.  A subcutaneous port pocket was then created along the upper chest wall utilizing a combination of sharp and blunt dissection.  The pocket was irrigated with sterile saline.  A single lumen ISP power injectable port was chosen for placement.  The 8 Fr catheter was tunneled from the port pocket site to the venotomy incision.  The port was placed in the pocket.  The external catheter was trimmed to appropriate length.  At the venotomy, an 8 Fr peel-away sheath was placed over a guidewire under fluoroscopic guidance.  The catheter was then placed through the sheath and the sheath was removed.  Final catheter  positioning was confirmed and documented with a fluoroscopic spot radiograph.  The port was accessed with a Huber needle, aspirated and flushed with heparinized saline.  The venotomy site was closed with an interrupted 4-0 Vicryl suture. The port pocket incision was closed with interrupted 2-0 Vicryl suture and the skin was opposed with a running subcuticular 4-0 Vicryl suture.  Dermabond and Steri-strips were applied to both incisions.  Dressings were placed.  The patient tolerated the procedure well without immediate post procedural complication.  Findings:  After catheter placement, the tip lies at the superior cavoatrial junction.  The catheter aspirates and flushes normally and is ready for immediate use.  Note is again made of near complete opacification of the left hemithorax, similar to recently performed chest radiograph.  IMPRESSION:  Successful placement of a right internal jugular approach single lumen power injectable Port-A-Cath.  The catheter is ready for immediate use.   Original Report Authenticated By: Tacey Ruiz, MD   Ir US Guide Vasc Access Right  07/12/2012   *RADIOLOGY REPORT*  Indication: History of lung cancer, in need of intravenous access for chemotherapy administration  IMPLANTED PORT A CATH PLACEMENT WITH ULTRASOUND AND FLUOROSCOPIC GUIDANCE  Comparison: Chest radiograph - 06/27/2012  Sedation: Versed 1.5 mg IV; Fentanyl 75 mcg IV; Zofran 4 mg IV; Ancef 2 gm IV; IV antibiotic was given in an appropriate time interval prior to skin puncture.  Total Moderate Sedation Time: 25 minutes.  Contrast: None  Fluoroscopy Time: 30 seconds  Complications: None immediate  Procedure:  The procedure, risks, benefits, and alternatives were explained to the patient.  Questions regarding the procedure were encouraged and answered.  The patient understands and consents to the procedure.  The right neck and chest were prepped with chlorhexidine in a sterile fashion, and a sterile drape was applied  covering the operative field.  Maximum barrier sterile technique with sterile gowns and gloves were used for the procedure.  A timeout was performed prior to the  initiation of the procedure.  Local anesthesia was provided with 1% lidocaine with epinephrine.  After creating a small venotomy incision, a micropuncture kit was utilized to access the right internal jugular vein under direct, real-time ultrasound guidance.  Ultrasound image documentation was performed.  The microwire was kinked to measure appropriate catheter length.  A subcutaneous port pocket was then created along the upper chest wall utilizing a combination of sharp and blunt dissection.  The pocket was irrigated with sterile saline.  A single lumen ISP power injectable port was chosen for placement.  The 8 Fr catheter was tunneled from the port pocket site to the venotomy incision.  The port was placed in the pocket.  The external catheter was trimmed to appropriate length.  At the venotomy, an 8 Fr peel-away sheath was placed over a guidewire under fluoroscopic guidance.  The catheter was then placed through the sheath and the sheath was removed.  Final catheter positioning was confirmed and documented with a fluoroscopic spot radiograph.  The port was accessed with a Huber needle, aspirated and flushed with heparinized saline.  The venotomy site was closed with an interrupted 4-0 Vicryl suture. The port pocket incision was closed with interrupted 2-0 Vicryl suture and the skin was opposed with a running subcuticular 4-0 Vicryl suture.  Dermabond and Steri-strips were applied to both incisions.  Dressings were placed.  The patient tolerated the procedure well without immediate post procedural complication.  Findings:  After catheter placement, the tip lies at the superior cavoatrial junction.  The catheter aspirates and flushes normally and is ready for immediate use.  Note is again made of near complete opacification of the left hemithorax, similar  to recently performed chest radiograph.  IMPRESSION:  Successful placement of a right internal jugular approach single lumen power injectable Port-A-Cath.  The catheter is ready for immediate use.   Original Report Authenticated By: Tacey Ruiz, MD    ASSESSMENT AND PLAN: This is a very pleasant 72 years old white female recently diagnosed with extensive stage small cell lung cancer with large central left obstructing lung mass as well as left pleural effusion, liver as well as brain metastasis. I have a lengthy discussion with the patient and her daughter and showed them the images of the MRI of the brain as well as CT scan of the chest, abdomen and pelvis. I recommended for the patient to proceed with her systemic chemotherapy with carboplatin and etoposide as scheduled today. I will refer the patient to radiation oncology, Dr. Basilio Cairo for consideration of palliative radiotherapy to the central left lung obstructing mass. I would hold on proceeding with whole brain radiation for now until the patient complete at least 1-2 cycles of chemotherapy to her agressive systemic disease. The patient and her daughter agreed to the current plan. She would come back for followup visit in one week for evaluation and management any adverse effect of her chemotherapy. She was advised to call immediately if she has any concerning symptoms in the interval.  All questions were answered. The patient knows to call the clinic with any problems, questions or concerns. We can certainly see the patient much sooner if necessary.  I spent 15 minutes counseling the patient face to face. The total time spent in the appointment was 25 minutes.

## 2012-07-18 NOTE — Patient Instructions (Signed)
We discussed the results of the MRI of the brain as well as CT scan of the chest, abdomen and pelvis. I recommended for him to proceed with systemic chemotherapy with carboplatin and etoposide today as scheduled. Referral to radiation oncology. Followup visit in one week

## 2012-07-19 ENCOUNTER — Ambulatory Visit
Admission: RE | Admit: 2012-07-19 | Discharge: 2012-07-19 | Disposition: A | Discharge status: Home / Self Care | Payer: Medicare Other | Source: Ambulatory Visit | Attending: Radiation Oncology | Admitting: Radiation Oncology

## 2012-07-19 ENCOUNTER — Ambulatory Visit: Payer: Medicare Other | Admitting: Internal Medicine

## 2012-07-19 ENCOUNTER — Ambulatory Visit (HOSPITAL_BASED_OUTPATIENT_CLINIC_OR_DEPARTMENT_OTHER): Payer: Medicare Other

## 2012-07-19 ENCOUNTER — Encounter: Payer: Self-pay | Admitting: Radiation Oncology

## 2012-07-19 VITALS — BP 157/79 | HR 77 | Temp 98.4°F | Ht 62.0 in | Wt 116.1 lb

## 2012-07-19 VITALS — BP 155/83 | HR 73 | Temp 97.4°F | Resp 20

## 2012-07-19 DIAGNOSIS — C787 Secondary malignant neoplasm of liver and intrahepatic bile duct: Secondary | ICD-10-CM | POA: Insufficient documentation

## 2012-07-19 DIAGNOSIS — C3490 Malignant neoplasm of unspecified part of unspecified bronchus or lung: Secondary | ICD-10-CM | POA: Insufficient documentation

## 2012-07-19 DIAGNOSIS — C3492 Malignant neoplasm of unspecified part of left bronchus or lung: Secondary | ICD-10-CM

## 2012-07-19 DIAGNOSIS — C7931 Secondary malignant neoplasm of brain: Secondary | ICD-10-CM

## 2012-07-19 DIAGNOSIS — J910 Malignant pleural effusion: Secondary | ICD-10-CM | POA: Insufficient documentation

## 2012-07-19 DIAGNOSIS — Z79899 Other long term (current) drug therapy: Secondary | ICD-10-CM | POA: Insufficient documentation

## 2012-07-19 DIAGNOSIS — C7970 Secondary malignant neoplasm of unspecified adrenal gland: Secondary | ICD-10-CM | POA: Insufficient documentation

## 2012-07-19 DIAGNOSIS — Z510 Encounter for antineoplastic radiation therapy: Secondary | ICD-10-CM | POA: Insufficient documentation

## 2012-07-19 DIAGNOSIS — Z8701 Personal history of pneumonia (recurrent): Secondary | ICD-10-CM | POA: Insufficient documentation

## 2012-07-19 DIAGNOSIS — Z5111 Encounter for antineoplastic chemotherapy: Secondary | ICD-10-CM

## 2012-07-19 DIAGNOSIS — C3430 Malignant neoplasm of lower lobe, unspecified bronchus or lung: Secondary | ICD-10-CM | POA: Insufficient documentation

## 2012-07-19 HISTORY — DX: Malignant neoplasm of unspecified part of unspecified bronchus or lung: C34.90

## 2012-07-19 MED ORDER — HEPARIN SOD (PORK) LOCK FLUSH 100 UNIT/ML IV SOLN
500.0000 [IU] | Freq: Once | INTRAVENOUS | Status: AC | PRN
  Administered 2012-07-19: 500 [IU]
  Filled 2012-07-19: qty 5

## 2012-07-19 MED ORDER — ONDANSETRON 8 MG/50ML IVPB (CHCC)
8.0000 mg | Freq: Once | INTRAVENOUS | Status: AC
  Administered 2012-07-19: 8 mg via INTRAVENOUS

## 2012-07-19 MED ORDER — SODIUM CHLORIDE 0.9 % IV SOLN
Freq: Once | INTRAVENOUS | Status: AC
  Administered 2012-07-19: 12:00:00 via INTRAVENOUS

## 2012-07-19 MED ORDER — DEXAMETHASONE SODIUM PHOSPHATE 10 MG/ML IJ SOLN
10.0000 mg | Freq: Once | INTRAMUSCULAR | Status: AC
  Administered 2012-07-19: 10 mg via INTRAVENOUS

## 2012-07-19 MED ORDER — SODIUM CHLORIDE 0.9 % IV SOLN
120.0000 mg/m2 | Freq: Once | INTRAVENOUS | Status: AC
  Administered 2012-07-19: 180 mg via INTRAVENOUS
  Filled 2012-07-19: qty 9

## 2012-07-19 MED ORDER — SODIUM CHLORIDE 0.9 % IJ SOLN
10.0000 mL | INTRAMUSCULAR | Status: DC | PRN
  Administered 2012-07-19: 10 mL
  Filled 2012-07-19: qty 10

## 2012-07-19 NOTE — Progress Notes (Signed)
Radiation Oncology         [phone] ________________________________  Initial outpatient Consultation  Name: Cheryl WAREN MRN: [id number]  Date: 07/19/2012  DOB: [birth year]  CC:No PCP Per Patient  Si Gaul, MD   REFERRING PHYSICIAN: Si Gaul, MD  DIAGNOSIS: Stage IV Small Cell Lung Cancer  HISTORY OF PRESENT ILLNESS::Cheryl Nielsen is a 72 y.o. female who presented with cough, SOB, chest tightness and congestion for six-months. CT imaging at the end of June demonstrated bulky central disease in her chest (both mediastinal and left hilar)  with left lung collapse. She has a substantial left pleural effusion. She also has liver metastases and adrenal metastases. She has brain metastases on MRI -at least 6 lesions, the largest of which is 1.0 cm. This is in the right parietal lobe.  Endobronchial biopsy on 07/05/2012 demonstrated small cell lung cancer.  She saw Dr. Shirline Frees who has recommended for the patient to proceed with her systemic chemotherapy with carboplatin and etoposide and her first cycle was given yesterday. He is particularly concerned about her central left lung obstructing mass.  Dr. Shirline Frees would prefer to hold off on proceeding with whole brain radiation for now until the patient complete at least 1-2 cycles of chemotherapy to her agressive systemic disease.  She has not noticed an abrupt onset or escalation of her symptoms to suggest recent lung collapse.  She reports 10 lb weight loss, unintentional.  Nausea since starting ABX a few weeks ago.  No HA.  Transient weakness, left leg, yesterday only.  No other neurologic complaints. No abdominal pain. SOB/chest tightness are much better today following cycle one of chemo being initiated with steroids yesterday.  She has not been SOB with rest.  She was SOB over the past several months with climbing stairs - this improved over the past 24 hrs. No dysphagia.   PREVIOUS RADIATION THERAPY: No  PAST MEDICAL  HISTORY:  has a past medical history of Pneumonia, organism unspecified and Lung cancer (07/05/12).    PAST SURGICAL HISTORY: Past Surgical History  Procedure Laterality Date  . Nasal sinus surgery    . Video bronchoscopy Bilateral 07/05/2012    Procedure: VIDEO BRONCHOSCOPY WITHOUT FLUORO;  Surgeon: Nyoka Cowden, MD;  Location: [facility];  Service: Cardiopulmonary;  Laterality: Bilateral;    FAMILY HISTORY: family history includes Colon cancer in her brother; Heart disease in her father and mother; Lung cancer (age of onset: 82) in her mother; and Prostate cancer in her father.  SOCIAL HISTORY:  reports that she quit smoking about 3 months ago. Her smoking use included Cigarettes. She has a 40 pack-year smoking history. She has never used smokeless tobacco. She reports that she does not drink alcohol or use illicit drugs.  ALLERGIES: Asa and Codeine  MEDICATIONS:  Current Outpatient Prescriptions  Medication Sig Dispense Refill  . acetaminophen (TYLENOL) 500 MG tablet Take 1,000 mg by mouth every 6 (six) hours as needed (For headache.).      Marland Kitchen glucosamine-chondroitin 500-400 MG tablet Take 1 tablet by mouth daily with lunch.       . lidocaine-prilocaine (EMLA) cream Apply 1 application topically daily as needed (Applies to port-a-cath.).      Marland Kitchen Multiple Vitamin (MULTIVITAMIN WITH MINERALS) TABS Take 1 tablet by mouth daily with lunch. She takes Surveyor, quantity Adult 50+.      . ondansetron (ZOFRAN) 4 MG tablet Take 4 mg by mouth every 4 (four) hours as needed for nausea.      Marland Kitchen  prochlorperazine (COMPAZINE) 10 MG tablet Take 10 mg by mouth every 6 (six) hours as needed (For nausea.).      Marland Kitchen ranitidine (ZANTAC) 150 MG tablet Take 150 mg by mouth daily as needed for heartburn.        No current facility-administered medications for this encounter.    REVIEW OF SYSTEMS: As above  PHYSICAL EXAM:  height is 5\' 2"  (1.575 m) and weight is 116 lb 1.6 oz (52.663 kg). Her temperature is 98.4  F (36.9 C). Her blood pressure is 157/79 and her pulse is 77. Her oxygen saturation is 95%.   General: Alert and oriented, in no acute distress HEENT: Head is normocephalic. Pupils are equally round and reactive to light. Extraocular movements are intact. Oropharynx is clear. Neck: Neck is supple, no palpable cervical or supraclavicular lymphadenopathy. Heart: Regular in rate and rhythm with no murmurs, rubs, or gallops. Chest:profoundly decreased breath sounds on left; no rhonchi, wheezes, or rales. Abdomen: Soft, nontender, nondistended, with no rigidity or guarding. Extremities: No cyanosis or edema. Lymphatics: No concerning lymphadenopathy. Skin: No concerning lesions. Musculoskeletal: symmetric strength and muscle tone throughout. Neurologic: Cranial nerves II through XII are grossly intact. No obvious focalities. Speech is fluent. Coordination is intact. Psychiatric: Judgment and insight are intact. Affect is appropriate.   LABORATORY DATA:  Lab Results  Component Value Date   WBC 9.1 07/18/2012   HGB 13.7 07/18/2012   HCT 42.4 07/18/2012   MCV 81.2 07/18/2012   PLT 250 07/18/2012   CMP     Component Value Date/Time   NA 132* 07/18/2012 0804   NA 133* 06/27/2012 1706   K 3.9 07/18/2012 0804   K 4.5 06/27/2012 1706   CL 93* 07/10/2012 1544   CL 95* 06/27/2012 1706   CO2 28 07/18/2012 0804   CO2 25 06/27/2012 1706   GLUCOSE 106 07/18/2012 0804   GLUCOSE 134* 07/10/2012 1544   GLUCOSE 94 06/27/2012 1706   BUN 11.0 07/18/2012 0804   BUN 14 06/27/2012 1706   CREATININE 0.8 07/18/2012 0804   CREATININE 1.0 06/27/2012 1706   CALCIUM 9.1 07/18/2012 0804   CALCIUM 9.1 06/27/2012 1706   PROT 6.8 07/18/2012 0804   ALBUMIN 2.9* 07/18/2012 0804   AST 21 07/18/2012 0804   ALT 12 07/18/2012 0804   ALKPHOS 98 07/18/2012 0804   BILITOT 0.29 07/18/2012 0804         RADIOGRAPHY: Dg Chest 2 View  06/27/2012   *RADIOLOGY REPORT*  Clinical Data: Follow up pneumonia  CHEST - 2 VIEW  Comparison: None.  Findings: The right  lung is well aerated and hyperexpanded without focal abnormality.  There is near-complete opacification of the left lung likely a combination of consolidation and pleural fluid. Mild mediastinal shift to the left is noted.  The osseous structures are within normal limits.  IMPRESSION: Near-complete opacification of the left hemithorax with mild mediastinal shift to the left   Original Report Authenticated By: Alcide Clever, M.D.   Ct Chest W Contrast  07/14/2012   *RADIOLOGY REPORT*  Clinical Data:  New diagnosis of lung cancer.  CT CHEST, ABDOMEN AND PELVIS WITH CONTRAST  Technique:  Multidetector CT imaging of the chest, abdomen and pelvis was performed following the standard protocol during bolus administration of intravenous contrast.  Contrast: 80mL OMNIPAQUE IOHEXOL 300 MG/ML  SOLN  Comparison:  06/27/2012  CT CHEST  Findings:  Large infiltrating left hilar tumor invades the mediastinum and is difficult to differentiate from the heterogeneous "drowned lung" appearance  of the left lung which is somewhat heterogeneous.  The mass measures about 8.5 cm anterior - posterior, up to 8 cm transverse, and extends around the circumference of the left pulmonary artery, into the AP window, and extends around the lower trachea were is confluent with subcarinal pathologic adenopathy with short axis diameter 2.3 cm.  Narrowing of the left pulmonary artery may be due to the lack of aeration in the left lung.  Branching low density in the left lung is primarily thought to be due to plugged airways although there may be satellite tumors in the left lung as well.  Left pleural effusion observed, likely malignant with lateral nodularity along the pleural surface.  There is a pericardial effusion which likewise may be malignant given the degree of mediastinal invasion.  Left mainstem bronchus is abruptly truncated by tumor and frothy material  Mildly prominent right hilar lymph node 1.1 cm.  Prevascular nodes include a 1.6 cm  short axis node on image 14 of series 2 and a 1.2 cm node on image 15 of series 2  Emphysema noted. 0.9 x 0.5 cm centrally calcified nodule in the apical segment right upper lobe, image 11 of series 4.  4 mm ground- glass densities subpleural nodule right lower lobe, image 34 of series 4.  Multiple nodules in the 2-3 mm range are present in the right lung.  Rounded density in the right proximal humeral head measuring 9 mm in diameter could conceivably represent a small metastatic lesion.  IMPRESSION:  1.  Large left hilar mass with extensive mediastinal invasion and invasion into adjacent left upper lobe and lower lobe. Pulmonary extent of mass is indistinct due to similar density with the "drowned lung" adjacent to it.  Left pleural effusion, likely malignant.  Pericardial effusion, possibly malignant. Mediastinal adenopathy and mild right hilar adenopathy observed. 2.  Emphysema. 3.  Possible small metastatic lesion in the right proximal humeral head.  CT ABDOMEN AND PELVIS  Findings:  Various metastatic lesions are scattered throughout the liver.  An index lesion in segment 6 measures 3.3 x 2.4 cm on image 68 of series 2.  Over 20 lesions are present in the liver.  Heterogeneous splenic enhancement is attributed to arterial phase of contrast.  2.9 x 1.9 cm mass in the left adrenal gland, likely metastatic.  2.8 x 2.0 cm mass in the right adrenal gland, likely metastatic.  Scattered small just aortic nodes at the level of the hiatus.  Several small renal cysts are present bilaterally.  The pancreas unremarkable.  Portal vein and splenic vein patent.  Fluid density left inguinal lesion, potentially a hydrocele and inguinal hernia or ganglion cyst.  Urinary bladder unremarkable. The uterus and adnexa appear unremarkable.  The appendix unremarkable.  IMPRESSION:  1.  Scattered metastatic lesions throughout the liver. 2.  Bilateral adrenal masses likely represent metastatic disease. Scattered small periaortic nodes at  the aortic hiatus, likely malignant. 3.  Small hydrocele or ganglion cyst in the left inguinal region.   Original Report Authenticated By: Gaylyn Rong, M.D.   Mr Laqueta Jean Wo Contrast  07/15/2012   *RADIOLOGY REPORT*  Clinical Data: The diagnosis of lung cancer.  Staging.  Nausea.  MRI HEAD WITHOUT AND WITH CONTRAST  Technique:  Multiplanar, multiecho pulse sequences of the brain and surrounding structures were obtained according to standard protocol without and with intravenous contrast  Contrast: 10mL MULTIHANCE GADOBENATE DIMEGLUMINE 529 MG/ML IV SOLN  Comparison: None.  Findings: I can identify six metastatic lesions  within the brain. The largest lesion is 810 mm metastasis in the subcortical white matter of the right parietal region near the vertex.  There is mild vasogenic edema.  There is a 4 mm metastasis in the left frontal lobe without edema.  There are four cerebellar metastases, the largest in the right cerebellum laterally measuring 5 mm.  No evidence of ischemic infarction.  No evidence of hemorrhage, hydrocephalus or extra-axial collection.  No pituitary mass.  No inflammatory sinus disease.  No skull or skull base lesion. Incidental small nasopharyngeal cyst noted.  IMPRESSION: Six brain metastases identified, the largest in the right parietal subcortical white matter measuring 10 mm with mild vasogenic edema. See above for full discussion.   Original Report Authenticated By: Paulina Fusi, M.D.   Ct Abdomen Pelvis W Contrast  07/14/2012   *RADIOLOGY REPORT*  Clinical Data:  New diagnosis of lung cancer.  CT CHEST, ABDOMEN AND PELVIS WITH CONTRAST  Technique:  Multidetector CT imaging of the chest, abdomen and pelvis was performed following the standard protocol during bolus administration of intravenous contrast.  Contrast: 80mL OMNIPAQUE IOHEXOL 300 MG/ML  SOLN  Comparison:  06/27/2012  CT CHEST  Findings:  Large infiltrating left hilar tumor invades the mediastinum and is difficult to  differentiate from the heterogeneous "drowned lung" appearance of the left lung which is somewhat heterogeneous.  The mass measures about 8.5 cm anterior - posterior, up to 8 cm transverse, and extends around the circumference of the left pulmonary artery, into the AP window, and extends around the lower trachea were is confluent with subcarinal pathologic adenopathy with short axis diameter 2.3 cm.  Narrowing of the left pulmonary artery may be due to the lack of aeration in the left lung.  Branching low density in the left lung is primarily thought to be due to plugged airways although there may be satellite tumors in the left lung as well.  Left pleural effusion observed, likely malignant with lateral nodularity along the pleural surface.  There is a pericardial effusion which likewise may be malignant given the degree of mediastinal invasion.  Left mainstem bronchus is abruptly truncated by tumor and frothy material  Mildly prominent right hilar lymph node 1.1 cm.  Prevascular nodes include a 1.6 cm short axis node on image 14 of series 2 and a 1.2 cm node on image 15 of series 2  Emphysema noted. 0.9 x 0.5 cm centrally calcified nodule in the apical segment right upper lobe, image 11 of series 4.  4 mm ground- glass densities subpleural nodule right lower lobe, image 34 of series 4.  Multiple nodules in the 2-3 mm range are present in the right lung.  Rounded density in the right proximal humeral head measuring 9 mm in diameter could conceivably represent a small metastatic lesion.  IMPRESSION:  1.  Large left hilar mass with extensive mediastinal invasion and invasion into adjacent left upper lobe and lower lobe. Pulmonary extent of mass is indistinct due to similar density with the "drowned lung" adjacent to it.  Left pleural effusion, likely malignant.  Pericardial effusion, possibly malignant. Mediastinal adenopathy and mild right hilar adenopathy observed. 2.  Emphysema. 3.  Possible small metastatic  lesion in the right proximal humeral head.  CT ABDOMEN AND PELVIS  Findings:  Various metastatic lesions are scattered throughout the liver.  An index lesion in segment 6 measures 3.3 x 2.4 cm on image 68 of series 2.  Over 20 lesions are present in the liver.  Heterogeneous  splenic enhancement is attributed to arterial phase of contrast.  2.9 x 1.9 cm mass in the left adrenal gland, likely metastatic.  2.8 x 2.0 cm mass in the right adrenal gland, likely metastatic.  Scattered small just aortic nodes at the level of the hiatus.  Several small renal cysts are present bilaterally.  The pancreas unremarkable.  Portal vein and splenic vein patent.  Fluid density left inguinal lesion, potentially a hydrocele and inguinal hernia or ganglion cyst.  Urinary bladder unremarkable. The uterus and adnexa appear unremarkable.  The appendix unremarkable.  IMPRESSION:  1.  Scattered metastatic lesions throughout the liver. 2.  Bilateral adrenal masses likely represent metastatic disease. Scattered small periaortic nodes at the aortic hiatus, likely malignant. 3.  Small hydrocele or ganglion cyst in the left inguinal region.   Original Report Authenticated By: Gaylyn Rong, M.D.   Ir Fluoro Guide Cv Line Right  07/12/2012   *RADIOLOGY REPORT*  Indication: History of lung cancer, in need of intravenous access for chemotherapy administration  IMPLANTED PORT A CATH PLACEMENT WITH ULTRASOUND AND FLUOROSCOPIC GUIDANCE  Comparison: Chest radiograph - 06/27/2012  Sedation: Versed 1.5 mg IV; Fentanyl 75 mcg IV; Zofran 4 mg IV; Ancef 2 gm IV; IV antibiotic was given in an appropriate time interval prior to skin puncture.  Total Moderate Sedation Time: 25 minutes.  Contrast: None  Fluoroscopy Time: 30 seconds  Complications: None immediate  Procedure:  The procedure, risks, benefits, and alternatives were explained to the patient.  Questions regarding the procedure were encouraged and answered.  The patient understands and consents  to the procedure.  The right neck and chest were prepped with chlorhexidine in a sterile fashion, and a sterile drape was applied covering the operative field.  Maximum barrier sterile technique with sterile gowns and gloves were used for the procedure.  A timeout was performed prior to the initiation of the procedure.  Local anesthesia was provided with 1% lidocaine with epinephrine.  After creating a small venotomy incision, a micropuncture kit was utilized to access the right internal jugular vein under direct, real-time ultrasound guidance.  Ultrasound image documentation was performed.  The microwire was kinked to measure appropriate catheter length.  A subcutaneous port pocket was then created along the upper chest wall utilizing a combination of sharp and blunt dissection.  The pocket was irrigated with sterile saline.  A single lumen ISP power injectable port was chosen for placement.  The 8 Fr catheter was tunneled from the port pocket site to the venotomy incision.  The port was placed in the pocket.  The external catheter was trimmed to appropriate length.  At the venotomy, an 8 Fr peel-away sheath was placed over a guidewire under fluoroscopic guidance.  The catheter was then placed through the sheath and the sheath was removed.  Final catheter positioning was confirmed and documented with a fluoroscopic spot radiograph.  The port was accessed with a Huber needle, aspirated and flushed with heparinized saline.  The venotomy site was closed with an interrupted 4-0 Vicryl suture. The port pocket incision was closed with interrupted 2-0 Vicryl suture and the skin was opposed with a running subcuticular 4-0 Vicryl suture.  Dermabond and Steri-strips were applied to both incisions.  Dressings were placed.  The patient tolerated the procedure well without immediate post procedural complication.  Findings:  After catheter placement, the tip lies at the superior cavoatrial junction.  The catheter aspirates and  flushes normally and is ready for immediate use.  Note is again  made of near complete opacification of the left hemithorax, similar to recently performed chest radiograph.  IMPRESSION:  Successful placement of a right internal jugular approach single lumen power injectable Port-A-Cath.  The catheter is ready for immediate use.   Original Report Authenticated By: Tacey Ruiz, MD   Ir US Guide Vasc Access Right  07/12/2012   *RADIOLOGY REPORT*  Indication: History of lung cancer, in need of intravenous access for chemotherapy administration  IMPLANTED PORT A CATH PLACEMENT WITH ULTRASOUND AND FLUOROSCOPIC GUIDANCE  Comparison: Chest radiograph - 06/27/2012  Sedation: Versed 1.5 mg IV; Fentanyl 75 mcg IV; Zofran 4 mg IV; Ancef 2 gm IV; IV antibiotic was given in an appropriate time interval prior to skin puncture.  Total Moderate Sedation Time: 25 minutes.  Contrast: None  Fluoroscopy Time: 30 seconds  Complications: None immediate  Procedure:  The procedure, risks, benefits, and alternatives were explained to the patient.  Questions regarding the procedure were encouraged and answered.  The patient understands and consents to the procedure.  The right neck and chest were prepped with chlorhexidine in a sterile fashion, and a sterile drape was applied covering the operative field.  Maximum barrier sterile technique with sterile gowns and gloves were used for the procedure.  A timeout was performed prior to the initiation of the procedure.  Local anesthesia was provided with 1% lidocaine with epinephrine.  After creating a small venotomy incision, a micropuncture kit was utilized to access the right internal jugular vein under direct, real-time ultrasound guidance.  Ultrasound image documentation was performed.  The microwire was kinked to measure appropriate catheter length.  A subcutaneous port pocket was then created along the upper chest wall utilizing a combination of sharp and blunt dissection.  The pocket was  irrigated with sterile saline.  A single lumen ISP power injectable port was chosen for placement.  The 8 Fr catheter was tunneled from the port pocket site to the venotomy incision.  The port was placed in the pocket.  The external catheter was trimmed to appropriate length.  At the venotomy, an 8 Fr peel-away sheath was placed over a guidewire under fluoroscopic guidance.  The catheter was then placed through the sheath and the sheath was removed.  Final catheter positioning was confirmed and documented with a fluoroscopic spot radiograph.  The port was accessed with a Huber needle, aspirated and flushed with heparinized saline.  The venotomy site was closed with an interrupted 4-0 Vicryl suture. The port pocket incision was closed with interrupted 2-0 Vicryl suture and the skin was opposed with a running subcuticular 4-0 Vicryl suture.  Dermabond and Steri-strips were applied to both incisions.  Dressings were placed.  The patient tolerated the procedure well without immediate post procedural complication.  Findings:  After catheter placement, the tip lies at the superior cavoatrial junction.  The catheter aspirates and flushes normally and is ready for immediate use.  Note is again made of near complete opacification of the left hemithorax, similar to recently performed chest radiograph.  IMPRESSION:  Successful placement of a right internal jugular approach single lumen power injectable Port-A-Cath.  The catheter is ready for immediate use.   Original Report Authenticated By: Tacey Ruiz, MD      IMPRESSION/PLAN:  This is a lovely 72 yo woman with newly diagnosed small cell lung cancer.  She started her first cycle of carboplatin and etoposide yesterday. She has lung disease but also adrenal liver and brain metastases. I concur with Dr. Arbutus Ped that  it is appropriate for [place] to hold off on whole brain radiotherapy until she has received at least a couple cycles of systemic therapy. She and I discussed  this today. However she is has substantial disease in her chest. I did tell the patient that it is not terribly realistic that her lung will reinflate with radiotherapy as I suspect it has been collapsed for quite some time. Additionally, there is pressure from a large pleural effusion. That being said, I did recommend radiotherapy to the bulky disease in the left hilum and the central chest for local control, and the small potential benefit of lung reinflation. The patient has a family trip to the beach during the week of July 28. I was able to schedule her for simulation today, and will expedite her plans so that she can start her treatment on Monday, July 7 and complete her treatments by her family trip. I will prescribe 35 gray in 14 fractions. Consent form has been signed and placed in her chart. She understands the side effects may include but may not necessarily be limited to fatigue and skin irritation as well as esophagitis. She is enthusiastic to proceed.    I spent 45 minutes minutes face to face with the patient and more than 50% of that time was spent in counseling and/or coordination of care.    __________________________________________   Lonie Peak, MD

## 2012-07-19 NOTE — Progress Notes (Signed)
  Radiation Oncology         [phone] ________________________________  Name: Cheryl Nielsen MRN: [id number]  Date: 07/19/2012  DOB: 1940-12-30  SIMULATION AND TREATMENT PLANNING NOTE/ Spec Tx Procedure  Outpatient  DIAGNOSIS:  Small Cell Lung cancer  NARRATIVE:  The patient was brought to the CT Simulation planning suite.  Identity was confirmed.  All relevant records and images related to the planned course of therapy were reviewed.  The patient freely provided informed written consent to proceed with treatment after reviewing the details related to the planned course of therapy. The consent form was witnessed and verified by the simulation staff.    Then, the patient was set-up in a stable reproducible  supine position for radiation therapy.  CT images were obtained.  Surface markings were placed.  The CT images were loaded into the planning software.    TREATMENT PLANNING NOTE: Treatment planning then occurred.  The radiation prescription was entered and confirmed.    A total of  3 medically necessary complex treatment devices were fabricated and supervised by me - an AP, PA, and Left lateral field with MLCs for customs blocks to shield lung, heart, esophageal, and spinal cord tissue. I have requested : Isodose Plan/dose calc's.   The patient will receive 35 Gy in 14 fractions to her bulky mediastinal/left hilar disease.  Special Treatment Procedure Note: The patient will be receiving chemotherapy concurrently. Chemotherapy heightens the risk of side effects. I have considered this during the patient's treatment planning process and will monitor the patient accordingly for side effects on a weekly basis. Concurrent chemotherapy increases the complexity of this patient's treatment and therefore this constitutes a special treatment procedure.    -----------------------------------  Lonie Peak, MD

## 2012-07-19 NOTE — Addendum Note (Signed)
Encounter addended by: Delynn Flavin, RN on: 07/19/2012  4:01 PM<BR>     Documentation filed: Charges VN

## 2012-07-19 NOTE — Patient Instructions (Addendum)
[facility] Discharge Instructions for Patients Receiving Chemotherapy  Today you received the following chemotherapy agents :  Etoposide.  To help prevent nausea and vomiting after your treatment, we encourage you to take your nausea medication as instructed by your physician.   If you develop nausea and vomiting that is not controlled by your nausea medication, call the clinic.   BELOW ARE SYMPTOMS THAT SHOULD BE REPORTED IMMEDIATELY:  *FEVER GREATER THAN 100.5 F  *CHILLS WITH OR WITHOUT FEVER  NAUSEA AND VOMITING THAT IS NOT CONTROLLED WITH YOUR NAUSEA MEDICATION  *UNUSUAL SHORTNESS OF BREATH  *UNUSUAL BRUISING OR BLEEDING  TENDERNESS IN MOUTH AND THROAT WITH OR WITHOUT PRESENCE OF ULCERS  *URINARY PROBLEMS  *BOWEL PROBLEMS  UNUSUAL RASH Items with * indicate a potential emergency and should be followed up as soon as possible.  Feel free to call the clinic you have any questions or concerns. The clinic phone number is [phone].    

## 2012-07-20 ENCOUNTER — Ambulatory Visit (HOSPITAL_BASED_OUTPATIENT_CLINIC_OR_DEPARTMENT_OTHER): Payer: Medicare Other

## 2012-07-20 VITALS — BP 151/80 | HR 70 | Temp 97.9°F | Resp 20

## 2012-07-20 DIAGNOSIS — C787 Secondary malignant neoplasm of liver and intrahepatic bile duct: Secondary | ICD-10-CM

## 2012-07-20 DIAGNOSIS — C3490 Malignant neoplasm of unspecified part of unspecified bronchus or lung: Secondary | ICD-10-CM

## 2012-07-20 DIAGNOSIS — Z5111 Encounter for antineoplastic chemotherapy: Secondary | ICD-10-CM

## 2012-07-20 DIAGNOSIS — C3492 Malignant neoplasm of unspecified part of left bronchus or lung: Secondary | ICD-10-CM

## 2012-07-20 MED ORDER — SODIUM CHLORIDE 0.9 % IV SOLN
120.0000 mg/m2 | Freq: Once | INTRAVENOUS | Status: AC
  Administered 2012-07-20: 180 mg via INTRAVENOUS
  Filled 2012-07-20: qty 9

## 2012-07-20 MED ORDER — DEXAMETHASONE SODIUM PHOSPHATE 10 MG/ML IJ SOLN
10.0000 mg | Freq: Once | INTRAMUSCULAR | Status: AC
  Administered 2012-07-20: 10 mg via INTRAVENOUS

## 2012-07-20 MED ORDER — SODIUM CHLORIDE 0.9 % IV SOLN
Freq: Once | INTRAVENOUS | Status: AC
  Administered 2012-07-20: 15:00:00 via INTRAVENOUS

## 2012-07-20 MED ORDER — HEPARIN SOD (PORK) LOCK FLUSH 100 UNIT/ML IV SOLN
500.0000 [IU] | Freq: Once | INTRAVENOUS | Status: AC | PRN
  Administered 2012-07-20: 500 [IU]
  Filled 2012-07-20: qty 5

## 2012-07-20 MED ORDER — SODIUM CHLORIDE 0.9 % IJ SOLN
10.0000 mL | INTRAMUSCULAR | Status: DC | PRN
  Administered 2012-07-20: 10 mL
  Filled 2012-07-20: qty 10

## 2012-07-20 MED ORDER — ONDANSETRON 8 MG/50ML IVPB (CHCC)
8.0000 mg | Freq: Once | INTRAVENOUS | Status: AC
  Administered 2012-07-20: 8 mg via INTRAVENOUS

## 2012-07-20 NOTE — Patient Instructions (Addendum)
[facility] Discharge Instructions for Patients Receiving Chemotherapy  Today you received the following chemotherapy agents: Etoposide.  To help prevent nausea and vomiting after your treatment, we encourage you to take your nausea medication as prescribed.   If you develop nausea and vomiting that is not controlled by your nausea medication, call the clinic.   BELOW ARE SYMPTOMS THAT SHOULD BE REPORTED IMMEDIATELY:  *FEVER GREATER THAN 100.5 F  *CHILLS WITH OR WITHOUT FEVER  NAUSEA AND VOMITING THAT IS NOT CONTROLLED WITH YOUR NAUSEA MEDICATION  *UNUSUAL SHORTNESS OF BREATH  *UNUSUAL BRUISING OR BLEEDING  TENDERNESS IN MOUTH AND THROAT WITH OR WITHOUT PRESENCE OF ULCERS  *URINARY PROBLEMS  *BOWEL PROBLEMS  UNUSUAL RASH Items with * indicate a potential emergency and should be followed up as soon as possible.  Feel free to call the clinic you have any questions or concerns. The clinic phone number is [phone].    

## 2012-07-22 ENCOUNTER — Ambulatory Visit (HOSPITAL_BASED_OUTPATIENT_CLINIC_OR_DEPARTMENT_OTHER): Payer: Medicare Other

## 2012-07-22 VITALS — BP 147/93 | HR 99 | Temp 97.9°F

## 2012-07-22 DIAGNOSIS — C787 Secondary malignant neoplasm of liver and intrahepatic bile duct: Secondary | ICD-10-CM

## 2012-07-22 DIAGNOSIS — C3490 Malignant neoplasm of unspecified part of unspecified bronchus or lung: Secondary | ICD-10-CM

## 2012-07-22 DIAGNOSIS — C3492 Malignant neoplasm of unspecified part of left bronchus or lung: Secondary | ICD-10-CM

## 2012-07-22 DIAGNOSIS — Z5189 Encounter for other specified aftercare: Secondary | ICD-10-CM

## 2012-07-22 DIAGNOSIS — C7931 Secondary malignant neoplasm of brain: Secondary | ICD-10-CM

## 2012-07-22 MED ORDER — PEGFILGRASTIM INJECTION 6 MG/0.6ML
6.0000 mg | Freq: Once | SUBCUTANEOUS | Status: AC
  Administered 2012-07-22: 6 mg via SUBCUTANEOUS

## 2012-07-24 ENCOUNTER — Encounter: Payer: Self-pay | Admitting: Internal Medicine

## 2012-07-24 ENCOUNTER — Ambulatory Visit (HOSPITAL_BASED_OUTPATIENT_CLINIC_OR_DEPARTMENT_OTHER): Payer: Medicare Other | Admitting: Internal Medicine

## 2012-07-24 ENCOUNTER — Other Ambulatory Visit (HOSPITAL_BASED_OUTPATIENT_CLINIC_OR_DEPARTMENT_OTHER): Payer: Medicare Other | Admitting: Lab

## 2012-07-24 ENCOUNTER — Ambulatory Visit
Admission: RE | Admit: 2012-07-24 | Discharge: 2012-07-24 | Disposition: A | Discharge status: Home / Self Care | Payer: Medicare Other | Source: Ambulatory Visit | Attending: Radiation Oncology | Admitting: Radiation Oncology

## 2012-07-24 ENCOUNTER — Encounter: Payer: Self-pay | Admitting: Radiation Oncology

## 2012-07-24 VITALS — BP 127/85 | HR 107 | Temp 97.2°F | Resp 18 | Ht 62.0 in | Wt 111.6 lb

## 2012-07-24 DIAGNOSIS — C3492 Malignant neoplasm of unspecified part of left bronchus or lung: Secondary | ICD-10-CM

## 2012-07-24 DIAGNOSIS — C3490 Malignant neoplasm of unspecified part of unspecified bronchus or lung: Secondary | ICD-10-CM

## 2012-07-24 DIAGNOSIS — C787 Secondary malignant neoplasm of liver and intrahepatic bile duct: Secondary | ICD-10-CM

## 2012-07-24 LAB — CBC WITH DIFFERENTIAL/PLATELET
Eosinophils Absolute: 0.1 10*3/uL (ref 0.0–0.5)
MCV: 80.4 fL (ref 79.5–101.0)
MONO%: 1.2 % (ref 0.0–14.0)
NEUT#: 23.9 10*3/uL — ABNORMAL HIGH (ref 1.5–6.5)
RBC: 4.99 10*6/uL (ref 3.70–5.45)
RDW: 14.6 % — ABNORMAL HIGH (ref 11.2–14.5)
WBC: 26 10*3/uL — ABNORMAL HIGH (ref 3.9–10.3)

## 2012-07-24 LAB — COMPREHENSIVE METABOLIC PANEL (CC13)
ALT: 37 U/L (ref 0–55)
AST: 32 U/L (ref 5–34)
Albumin: 3.1 g/dL — ABNORMAL LOW (ref 3.5–5.0)
Alkaline Phosphatase: 124 U/L (ref 40–150)
Glucose: 115 mg/dl (ref 70–140)
Potassium: 3.9 mEq/L (ref 3.5–5.1)
Sodium: 129 mEq/L — ABNORMAL LOW (ref 136–145)
Total Protein: 6.7 g/dL (ref 6.4–8.3)

## 2012-07-24 NOTE — Progress Notes (Signed)
Simulation Verification Note outpatient  The patient was brought to the treatment unit and placed in the planned treatment position. The clinical setup was verified. Then port films were obtained and uploaded to the radiation oncology medical record software.  The treatment beams were carefully compared against the planned radiation fields. The position location and shape of the radiation fields was reviewed. They targeted volume of tissue appears to be appropriately covered by the radiation beams. Organs at risk appear to be excluded as planned.    I did request a small lateral shift. Based on my personal review, I approved the simulation verification. The patient's treatment will proceed as planned with shift tomorrow.  -----------------------------------  Lonie Peak, MD

## 2012-07-24 NOTE — Progress Notes (Signed)
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 First cycle starts today 07/18/2012.  INTERVAL HISTORY: Cheryl Nielsen 72 y.o. female returns to the clinic today for followup visit accompanied by her son. The patient tolerated the first week of her systemic chemotherapy with carboplatin and etoposide fairly well with no significant adverse effects. She denied having any significant nausea or vomiting. She has no fever or chills. The patient denied having any significant weight loss or night sweats. She mentions that her breathing is better. She denied having any significant chest pain but continues to have mild cough with no hemoptysis.  MEDICAL HISTORY: Past Medical History  Diagnosis Date  . Pneumonia, organism unspecified   . Lung cancer 07/05/12    Left Mainstem Bronchus- Small Cell Carcinoma    ALLERGIES:  is allergic to asa and codeine.  MEDICATIONS:  Current Outpatient Prescriptions  Medication Sig Dispense Refill  . glucosamine-chondroitin 500-400 MG tablet Take 1 tablet by mouth daily with lunch.       . lidocaine-prilocaine (EMLA) cream Apply 1 application topically daily as needed (Applies to port-a-cath.).      Marland Kitchen Multiple Vitamin (MULTIVITAMIN WITH MINERALS) TABS Take 1 tablet by mouth daily with lunch. She takes Surveyor, quantity Adult 50+.      . prochlorperazine (COMPAZINE) 10 MG tablet Take 10 mg by mouth every 6 (six) hours as needed (For nausea.).      Marland Kitchen ranitidine (ZANTAC) 150 MG tablet Take 150 mg by mouth daily as needed for heartburn.       Marland Kitchen acetaminophen (TYLENOL) 500 MG tablet  Take 1,000 mg by mouth every 6 (six) hours as needed (For headache.).      Marland Kitchen ondansetron (ZOFRAN) 4 MG tablet Take 4 mg by mouth every 4 (four) hours as needed for nausea.       No current facility-administered medications for this visit.    SURGICAL HISTORY:  Past Surgical History  Procedure Laterality Date  . Nasal sinus surgery    . Video bronchoscopy Bilateral 07/05/2012    Procedure: VIDEO BRONCHOSCOPY WITHOUT FLUORO;  Surgeon: Nyoka Cowden, MD;  Location: [facility];  Service: Cardiopulmonary;  Laterality: Bilateral;    REVIEW OF SYSTEMS:  A comprehensive review of systems was negative except for: Respiratory: positive for dyspnea on exertion   PHYSICAL EXAMINATION: General appearance: alert, cooperative and no distress Head: Normocephalic, without obvious abnormality, atraumatic Neck: no adenopathy Lymph nodes: Cervical, supraclavicular, and axillary nodes normal. Resp: diminished breath sounds LLL and LUL and dullness to percussion LLL and LUL Cardio: regular rate and rhythm, S1, S2 normal, no murmur, click, rub or gallop GI: soft, non-tender; bowel sounds normal; no masses,  no organomegaly Extremities: extremities normal, atraumatic, no cyanosis or edema Neurologic: Alert and oriented X 3, normal strength and tone. Normal symmetric reflexes. Normal coordination and gait  ECOG PERFORMANCE STATUS: 1 - Symptomatic but completely ambulatory  Blood pressure 127/85, pulse 107, temperature 97.2 F (36.2 C), temperature source Oral, resp. rate 18, height 5\' 2"  (1.575 m), weight 111 lb 9.6 oz (50.621 kg).  LABORATORY DATA:  Lab Results  Component Value Date   WBC 26.0* 07/24/2012   HGB 13.0 07/24/2012   HCT 40.1 07/24/2012   MCV 80.4 07/24/2012   PLT 255 07/24/2012      Chemistry      Component Value Date/Time   NA 132* 07/18/2012 0804   NA 133* 06/27/2012 1706   K 3.9 07/18/2012 0804   K 4.5 06/27/2012 1706   CL 93* 07/10/2012 1544   CL 95* 06/27/2012 1706   CO2 28 07/18/2012 0804     CO2 25 06/27/2012 1706   BUN 11.0 07/18/2012 0804   BUN 14 06/27/2012 1706   CREATININE 0.8 07/18/2012 0804   CREATININE 1.0 06/27/2012 1706      Component Value Date/Time   CALCIUM 9.1 07/18/2012 0804   CALCIUM 9.1 06/27/2012 1706   ALKPHOS 98 07/18/2012 0804   AST 21 07/18/2012 0804   ALT 12 07/18/2012 0804   BILITOT 0.29 07/18/2012 0804       RADIOGRAPHIC STUDIES: Dg Chest 2 View  06/27/2012   *RADIOLOGY REPORT*  Clinical Data: Follow up pneumonia  CHEST - 2 VIEW  Comparison: None.  Findings: The right lung is well aerated and hyperexpanded without focal abnormality.  There is near-complete opacification of the left lung likely a combination of consolidation and pleural fluid. Mild mediastinal shift to the left is noted.  The osseous structures are within normal limits.  IMPRESSION: Near-complete opacification of the left hemithorax with mild mediastinal shift to the left   Original Report Authenticated By: Alcide Clever, M.D.   Ct Chest W Contrast  07/14/2012   *RADIOLOGY REPORT*  Clinical Data:  New diagnosis of lung cancer.  CT CHEST, ABDOMEN AND PELVIS WITH CONTRAST  Technique:  Multidetector CT imaging of the chest, abdomen and pelvis was performed following the standard protocol during bolus administration of intravenous contrast.  Contrast: 80mL OMNIPAQUE IOHEXOL 300 MG/ML  SOLN  Comparison:  06/27/2012  CT CHEST  Findings:  Large infiltrating left hilar tumor invades the mediastinum and is difficult to differentiate from the heterogeneous "drowned lung" appearance of the left lung which is somewhat heterogeneous.  The mass measures about 8.5 cm anterior - posterior, up to 8 cm transverse, and extends around the circumference of the left pulmonary artery, into the AP window, and extends around the lower trachea were is confluent with subcarinal pathologic adenopathy with short axis diameter 2.3 cm.  Narrowing of the left pulmonary artery may be due to the lack of aeration in the left lung.  Branching  low density in the left lung is primarily thought to be due to plugged airways although there may be satellite tumors in the left lung as well.  Left pleural effusion observed, likely malignant with lateral nodularity along the pleural surface.  There is a pericardial effusion which likewise may be malignant given the degree of mediastinal invasion.  Left mainstem bronchus is abruptly truncated by tumor and frothy material  Mildly prominent right hilar lymph node 1.1 cm.  Prevascular nodes include a 1.6 cm short axis node on image 14 of series 2 and a 1.2 cm node on image 15 of series 2  Emphysema noted. 0.9 x 0.5 cm centrally calcified nodule in the apical segment right upper lobe, image 11 of series 4.  4 mm ground- glass densities subpleural nodule right lower lobe, image 34 of series 4.  Multiple nodules in the 2-3 mm range are present in the right lung.  Rounded density in the right proximal humeral  head measuring 9 mm in diameter could conceivably represent a small metastatic lesion.  IMPRESSION:  1.  Large left hilar mass with extensive mediastinal invasion and invasion into adjacent left upper lobe and lower lobe. Pulmonary extent of mass is indistinct due to similar density with the "drowned lung" adjacent to it.  Left pleural effusion, likely malignant.  Pericardial effusion, possibly malignant. Mediastinal adenopathy and mild right hilar adenopathy observed. 2.  Emphysema. 3.  Possible small metastatic lesion in the right proximal humeral head.  CT ABDOMEN AND PELVIS  Findings:  Various metastatic lesions are scattered throughout the liver.  An index lesion in segment 6 measures 3.3 x 2.4 cm on image 68 of series 2.  Over 20 lesions are present in the liver.  Heterogeneous splenic enhancement is attributed to arterial phase of contrast.  2.9 x 1.9 cm mass in the left adrenal gland, likely metastatic.  2.8 x 2.0 cm mass in the right adrenal gland, likely metastatic.  Scattered small just aortic nodes at  the level of the hiatus.  Several small renal cysts are present bilaterally.  The pancreas unremarkable.  Portal vein and splenic vein patent.  Fluid density left inguinal lesion, potentially a hydrocele and inguinal hernia or ganglion cyst.  Urinary bladder unremarkable. The uterus and adnexa appear unremarkable.  The appendix unremarkable.  IMPRESSION:  1.  Scattered metastatic lesions throughout the liver. 2.  Bilateral adrenal masses likely represent metastatic disease. Scattered small periaortic nodes at the aortic hiatus, likely malignant. 3.  Small hydrocele or ganglion cyst in the left inguinal region.   Original Report Authenticated By: Gaylyn Rong, M.D.   Mr Laqueta Jean Wo Contrast  07/15/2012   *RADIOLOGY REPORT*  Clinical Data: The diagnosis of lung cancer.  Staging.  Nausea.  MRI HEAD WITHOUT AND WITH CONTRAST  Technique:  Multiplanar, multiecho pulse sequences of the brain and surrounding structures were obtained according to standard protocol without and with intravenous contrast  Contrast: 10mL MULTIHANCE GADOBENATE DIMEGLUMINE 529 MG/ML IV SOLN  Comparison: None.  Findings: I can identify six metastatic lesions within the brain. The largest lesion is 810 mm metastasis in the subcortical white matter of the right parietal region near the vertex.  There is mild vasogenic edema.  There is a 4 mm metastasis in the left frontal lobe without edema.  There are four cerebellar metastases, the largest in the right cerebellum laterally measuring 5 mm.  No evidence of ischemic infarction.  No evidence of hemorrhage, hydrocephalus or extra-axial collection.  No pituitary mass.  No inflammatory sinus disease.  No skull or skull base lesion. Incidental small nasopharyngeal cyst noted.  IMPRESSION: Six brain metastases identified, the largest in the right parietal subcortical white matter measuring 10 mm with mild vasogenic edema. See above for full discussion.   Original Report Authenticated By: Paulina Fusi,  M.D.   Ct Abdomen Pelvis W Contrast  07/14/2012   *RADIOLOGY REPORT*  Clinical Data:  New diagnosis of lung cancer.  CT CHEST, ABDOMEN AND PELVIS WITH CONTRAST  Technique:  Multidetector CT imaging of the chest, abdomen and pelvis was performed following the standard protocol during bolus administration of intravenous contrast.  Contrast: 80mL OMNIPAQUE IOHEXOL 300 MG/ML  SOLN  Comparison:  06/27/2012  CT CHEST  Findings:  Large infiltrating left hilar tumor invades the mediastinum and is difficult to differentiate from the heterogeneous "drowned lung" appearance of the left lung which is somewhat heterogeneous.  The mass measures about 8.5 cm anterior - posterior, up to  8 cm transverse, and extends around the circumference of the left pulmonary artery, into the AP window, and extends around the lower trachea were is confluent with subcarinal pathologic adenopathy with short axis diameter 2.3 cm.  Narrowing of the left pulmonary artery may be due to the lack of aeration in the left lung.  Branching low density in the left lung is primarily thought to be due to plugged airways although there may be satellite tumors in the left lung as well.  Left pleural effusion observed, likely malignant with lateral nodularity along the pleural surface.  There is a pericardial effusion which likewise may be malignant given the degree of mediastinal invasion.  Left mainstem bronchus is abruptly truncated by tumor and frothy material  Mildly prominent right hilar lymph node 1.1 cm.  Prevascular nodes include a 1.6 cm short axis node on image 14 of series 2 and a 1.2 cm node on image 15 of series 2  Emphysema noted. 0.9 x 0.5 cm centrally calcified nodule in the apical segment right upper lobe, image 11 of series 4.  4 mm ground- glass densities subpleural nodule right lower lobe, image 34 of series 4.  Multiple nodules in the 2-3 mm range are present in the right lung.  Rounded density in the right proximal humeral head measuring  9 mm in diameter could conceivably represent a small metastatic lesion.  IMPRESSION:  1.  Large left hilar mass with extensive mediastinal invasion and invasion into adjacent left upper lobe and lower lobe. Pulmonary extent of mass is indistinct due to similar density with the "drowned lung" adjacent to it.  Left pleural effusion, likely malignant.  Pericardial effusion, possibly malignant. Mediastinal adenopathy and mild right hilar adenopathy observed. 2.  Emphysema. 3.  Possible small metastatic lesion in the right proximal humeral head.  CT ABDOMEN AND PELVIS  Findings:  Various metastatic lesions are scattered throughout the liver.  An index lesion in segment 6 measures 3.3 x 2.4 cm on image 68 of series 2.  Over 20 lesions are present in the liver.  Heterogeneous splenic enhancement is attributed to arterial phase of contrast.  2.9 x 1.9 cm mass in the left adrenal gland, likely metastatic.  2.8 x 2.0 cm mass in the right adrenal gland, likely metastatic.  Scattered small just aortic nodes at the level of the hiatus.  Several small renal cysts are present bilaterally.  The pancreas unremarkable.  Portal vein and splenic vein patent.  Fluid density left inguinal lesion, potentially a hydrocele and inguinal hernia or ganglion cyst.  Urinary bladder unremarkable. The uterus and adnexa appear unremarkable.  The appendix unremarkable.  IMPRESSION:  1.  Scattered metastatic lesions throughout the liver. 2.  Bilateral adrenal masses likely represent metastatic disease. Scattered small periaortic nodes at the aortic hiatus, likely malignant. 3.  Small hydrocele or ganglion cyst in the left inguinal region.   Original Report Authenticated By: Gaylyn Rong, M.D.   Ir Fluoro Guide Cv Line Right  07/12/2012   *RADIOLOGY REPORT*  Indication: History of lung cancer, in need of intravenous access for chemotherapy administration  IMPLANTED PORT A CATH PLACEMENT WITH ULTRASOUND AND FLUOROSCOPIC GUIDANCE  Comparison:  Chest radiograph - 06/27/2012  Sedation: Versed 1.5 mg IV; Fentanyl 75 mcg IV; Zofran 4 mg IV; Ancef 2 gm IV; IV antibiotic was given in an appropriate time interval prior to skin puncture.  Total Moderate Sedation Time: 25 minutes.  Contrast: None  Fluoroscopy Time: 30 seconds  Complications: None immediate  Procedure:  The procedure, risks, benefits, and alternatives were explained to the patient.  Questions regarding the procedure were encouraged and answered.  The patient understands and consents to the procedure.  The right neck and chest were prepped with chlorhexidine in a sterile fashion, and a sterile drape was applied covering the operative field.  Maximum barrier sterile technique with sterile gowns and gloves were used for the procedure.  A timeout was performed prior to the initiation of the procedure.  Local anesthesia was provided with 1% lidocaine with epinephrine.  After creating a small venotomy incision, a micropuncture kit was utilized to access the right internal jugular vein under direct, real-time ultrasound guidance.  Ultrasound image documentation was performed.  The microwire was kinked to measure appropriate catheter length.  A subcutaneous port pocket was then created along the upper chest wall utilizing a combination of sharp and blunt dissection.  The pocket was irrigated with sterile saline.  A single lumen ISP power injectable port was chosen for placement.  The 8 Fr catheter was tunneled from the port pocket site to the venotomy incision.  The port was placed in the pocket.  The external catheter was trimmed to appropriate length.  At the venotomy, an 8 Fr peel-away sheath was placed over a guidewire under fluoroscopic guidance.  The catheter was then placed through the sheath and the sheath was removed.  Final catheter positioning was confirmed and documented with a fluoroscopic spot radiograph.  The port was accessed with a Huber needle, aspirated and flushed with heparinized  saline.  The venotomy site was closed with an interrupted 4-0 Vicryl suture. The port pocket incision was closed with interrupted 2-0 Vicryl suture and the skin was opposed with a running subcuticular 4-0 Vicryl suture.  Dermabond and Steri-strips were applied to both incisions.  Dressings were placed.  The patient tolerated the procedure well without immediate post procedural complication.  Findings:  After catheter placement, the tip lies at the superior cavoatrial junction.  The catheter aspirates and flushes normally and is ready for immediate use.  Note is again made of near complete opacification of the left hemithorax, similar to recently performed chest radiograph.  IMPRESSION:  Successful placement of a right internal jugular approach single lumen power injectable Port-A-Cath.  The catheter is ready for immediate use.   Original Report Authenticated By: Tacey Ruiz, MD   Ir US Guide Vasc Access Right  07/12/2012   *RADIOLOGY REPORT*  Indication: History of lung cancer, in need of intravenous access for chemotherapy administration  IMPLANTED PORT A CATH PLACEMENT WITH ULTRASOUND AND FLUOROSCOPIC GUIDANCE  Comparison: Chest radiograph - 06/27/2012  Sedation: Versed 1.5 mg IV; Fentanyl 75 mcg IV; Zofran 4 mg IV; Ancef 2 gm IV; IV antibiotic was given in an appropriate time interval prior to skin puncture.  Total Moderate Sedation Time: 25 minutes.  Contrast: None  Fluoroscopy Time: 30 seconds  Complications: None immediate  Procedure:  The procedure, risks, benefits, and alternatives were explained to the patient.  Questions regarding the procedure were encouraged and answered.  The patient understands and consents to the procedure.  The right neck and chest were prepped with chlorhexidine in a sterile fashion, and a sterile drape was applied covering the operative field.  Maximum barrier sterile technique with sterile gowns and gloves were used for the procedure.  A timeout was performed prior to the  initiation of the procedure.  Local anesthesia was provided with 1% lidocaine with epinephrine.  After creating a small venotomy incision,  a micropuncture kit was utilized to access the right internal jugular vein under direct, real-time ultrasound guidance.  Ultrasound image documentation was performed.  The microwire was kinked to measure appropriate catheter length.  A subcutaneous port pocket was then created along the upper chest wall utilizing a combination of sharp and blunt dissection.  The pocket was irrigated with sterile saline.  A single lumen ISP power injectable port was chosen for placement.  The 8 Fr catheter was tunneled from the port pocket site to the venotomy incision.  The port was placed in the pocket.  The external catheter was trimmed to appropriate length.  At the venotomy, an 8 Fr peel-away sheath was placed over a guidewire under fluoroscopic guidance.  The catheter was then placed through the sheath and the sheath was removed.  Final catheter positioning was confirmed and documented with a fluoroscopic spot radiograph.  The port was accessed with a Huber needle, aspirated and flushed with heparinized saline.  The venotomy site was closed with an interrupted 4-0 Vicryl suture. The port pocket incision was closed with interrupted 2-0 Vicryl suture and the skin was opposed with a running subcuticular 4-0 Vicryl suture.  Dermabond and Steri-strips were applied to both incisions.  Dressings were placed.  The patient tolerated the procedure well without immediate post procedural complication.  Findings:  After catheter placement, the tip lies at the superior cavoatrial junction.  The catheter aspirates and flushes normally and is ready for immediate use.  Note is again made of near complete opacification of the left hemithorax, similar to recently performed chest radiograph.  IMPRESSION:  Successful placement of a right internal jugular approach single lumen power injectable Port-A-Cath.  The  catheter is ready for immediate use.   Original Report Authenticated By: Tacey Ruiz, MD    ASSESSMENT AND PLAN: This is a very pleasant 72 years old white female with extensive stage small cell lung cancer started systemic chemotherapy with carboplatin and etoposide last week. She is tolerating her treatment fairly well with no significant adverse effects. The patient was seen by radiation oncology and expected to start the first fraction of radiotherapy today. I have a lengthy discussion with the patient and her son about her condition. I recommended for her to continue treatment with systemic chemotherapy with carboplatin and etoposide. Next cycle will be scheduled in 2 weeks. I will continue to monitor her lab work closely. I would see her back for followup visit in 2 weeks for evaluation before starting cycle #2 She was advised to call immediately if she has any concerning symptoms in the interval  All questions were answered. The patient knows to call the clinic with any problems, questions or concerns. We can certainly see the patient much sooner if necessary.  I spent 15 minutes counseling the patient face to face. The total time spent in the appointment was 25 minutes.

## 2012-07-24 NOTE — Patient Instructions (Signed)
Continue chemotherapy as scheduled.  Followup visit in 2 weeks with the next cycle of chemotherapy.

## 2012-07-24 NOTE — Progress Notes (Signed)
   Weekly Management Note:  outpatient Current Dose:  2.5 Gy  Projected Dos: 35 Gy   Narrative:  The patient presents for routine under treatment assessment.  CBCT/MVCT images/Port film x-rays were reviewed.  The chart was checked. No complaints thus far  Physical Findings:  height is 5' 2.5" (1.588 m) and weight is 111 lb 12.8 oz (50.712 kg). Her temperature is 98 F (36.7 C). Her blood pressure is 135/81 and her pulse is 97. Her respiration is 16 and oxygen saturation is 96%.  NAD, no active dyspnea.  CBC    Component Value Date/Time   WBC 26.0* 07/24/2012 1349   RBC 4.99 07/24/2012 1349   HGB 13.0 07/24/2012 1349   HCT 40.1 07/24/2012 1349   PLT 255 07/24/2012 1349   MCV 80.4 07/24/2012 1349   MCH 26.1 07/24/2012 1349   MCHC 32.4 07/24/2012 1349   RDW 14.6* 07/24/2012 1349   LYMPHSABS 1.6 07/24/2012 1349   MONOABS 0.3 07/24/2012 1349   EOSABS 0.1 07/24/2012 1349   BASOSABS 0.1 07/24/2012 1349    CMP     Component Value Date/Time   NA 129* 07/24/2012 1349   NA 133* 06/27/2012 1706   K 3.9 07/24/2012 1349   K 4.5 06/27/2012 1706   CL 93* 07/10/2012 1544   CL 95* 06/27/2012 1706   CO2 31* 07/24/2012 1349   CO2 25 06/27/2012 1706   GLUCOSE 115 07/24/2012 1349   GLUCOSE 134* 07/10/2012 1544   GLUCOSE 94 06/27/2012 1706   BUN 18.0 07/24/2012 1349   BUN 14 06/27/2012 1706   CREATININE 0.9 07/24/2012 1349   CREATININE 1.0 06/27/2012 1706   CALCIUM 9.3 07/24/2012 1349   CALCIUM 9.1 06/27/2012 1706   PROT 6.7 07/24/2012 1349   ALBUMIN 3.1* 07/24/2012 1349   AST 32 07/24/2012 1349   ALT 37 07/24/2012 1349   ALKPHOS 124 07/24/2012 1349   BILITOT 0.35 07/24/2012 1349     Impression:  The patient is tolerating radiotherapy.  Plan:  Continue radiotherapy as planned.  ________________________________   Lonie Peak, M.D.

## 2012-07-25 ENCOUNTER — Telehealth: Payer: Self-pay | Admitting: *Deleted

## 2012-07-25 ENCOUNTER — Ambulatory Visit
Admission: RE | Admit: 2012-07-25 | Discharge: 2012-07-25 | Disposition: A | Discharge status: Home / Self Care | Payer: Medicare Other | Source: Ambulatory Visit | Attending: Radiation Oncology | Admitting: Radiation Oncology

## 2012-07-25 NOTE — Telephone Encounter (Signed)
Message copied by Augusto Garbe on Tue Jul 25, 2012  5:06 PM ------      Message from: [place], [place] E      Created: Tue Jul 18, 2012 11:38 AM      Regarding: Chemo follow up       First Carbo/VP      Injection day 4 ------

## 2012-07-25 NOTE — Telephone Encounter (Signed)
Per staff message and POF I have scheduled appts.  JMW  

## 2012-07-25 NOTE — Telephone Encounter (Signed)
Called Cheryl Nielsen at [phone] number(s).  Messge left requesting a return call for chemotherapy follow up.  Awaiting return call from patient.

## 2012-07-26 ENCOUNTER — Ambulatory Visit
Admission: RE | Admit: 2012-07-26 | Discharge: 2012-07-26 | Disposition: A | Discharge status: Home / Self Care | Payer: Medicare Other | Source: Ambulatory Visit | Attending: Radiation Oncology | Admitting: Radiation Oncology

## 2012-07-26 ENCOUNTER — Telehealth: Payer: Self-pay | Admitting: Dietician

## 2012-07-26 ENCOUNTER — Encounter: Payer: Self-pay | Admitting: *Deleted

## 2012-07-26 NOTE — Telephone Encounter (Signed)
Brief Outpatient Oncology Nutrition Note  Patient has been identified to be at risk on malnutrition screen.  Wt Readings from Last 10 Encounters:  07/24/12 111 lb 12.8 oz (50.712 kg)  07/24/12 111 lb 9.6 oz (50.621 kg)  07/19/12 116 lb 1.6 oz (52.663 kg)  07/18/12 113 lb 3.2 oz (51.347 kg)  07/12/12 110 lb 5 oz (50.037 kg)  07/10/12 114 lb 14.4 oz (52.118 kg)  06/27/12 117 lb 6.4 oz (53.252 kg)  04/05/11 122 lb (55.339 kg)   Patient with a 8% weight loss in the last 15 months.  Called patient who was unavailable and left message with [facility] Dietitian contact information.  Oran Rein, RD, LDN Clinical Inpatient Dietitian Pager:  [phone] Weekend and after hours pager:  [phone]

## 2012-07-27 ENCOUNTER — Ambulatory Visit
Admission: RE | Admit: 2012-07-27 | Discharge: 2012-07-27 | Disposition: A | Discharge status: Home / Self Care | Payer: Medicare Other | Source: Ambulatory Visit | Attending: Radiation Oncology | Admitting: Radiation Oncology

## 2012-07-28 ENCOUNTER — Ambulatory Visit
Admission: RE | Admit: 2012-07-28 | Discharge: 2012-07-28 | Disposition: A | Discharge status: Home / Self Care | Payer: Medicare Other | Source: Ambulatory Visit | Attending: Radiation Oncology | Admitting: Radiation Oncology

## 2012-07-31 ENCOUNTER — Other Ambulatory Visit (HOSPITAL_BASED_OUTPATIENT_CLINIC_OR_DEPARTMENT_OTHER): Payer: Medicare Other

## 2012-07-31 ENCOUNTER — Ambulatory Visit
Admission: RE | Admit: 2012-07-31 | Discharge: 2012-07-31 | Disposition: A | Discharge status: Home / Self Care | Payer: Medicare Other | Source: Ambulatory Visit | Attending: Radiation Oncology | Admitting: Radiation Oncology

## 2012-07-31 VITALS — BP 149/87 | HR 77 | Temp 98.7°F | Resp 20 | Wt 113.1 lb

## 2012-07-31 DIAGNOSIS — C3490 Malignant neoplasm of unspecified part of unspecified bronchus or lung: Secondary | ICD-10-CM

## 2012-07-31 DIAGNOSIS — C3492 Malignant neoplasm of unspecified part of left bronchus or lung: Secondary | ICD-10-CM

## 2012-07-31 LAB — CBC WITH DIFFERENTIAL/PLATELET
BASO%: 1 % (ref 0.0–2.0)
EOS%: 1.3 % (ref 0.0–7.0)
MCH: 26.4 pg (ref 25.1–34.0)
MCHC: 32.7 g/dL (ref 31.5–36.0)
MCV: 80.9 fL (ref 79.5–101.0)
MONO%: 7.6 % (ref 0.0–14.0)
NEUT#: 10.3 10*3/uL — ABNORMAL HIGH (ref 1.5–6.5)
RBC: 4.93 10*6/uL (ref 3.70–5.45)
RDW: 15.5 % — ABNORMAL HIGH (ref 11.2–14.5)

## 2012-07-31 LAB — COMPREHENSIVE METABOLIC PANEL (CC13)
AST: 22 U/L (ref 5–34)
Albumin: 3.2 g/dL — ABNORMAL LOW (ref 3.5–5.0)
Alkaline Phosphatase: 138 U/L (ref 40–150)
Potassium: 4.5 mEq/L (ref 3.5–5.1)
Sodium: 136 mEq/L (ref 136–145)
Total Bilirubin: <0.2 mg/dL (ref 0.20–1.20)
Total Protein: 6.8 g/dL (ref 6.4–8.3)

## 2012-07-31 MED ORDER — RADIAPLEXRX EX GEL
Freq: Once | CUTANEOUS | Status: AC
  Administered 2012-07-31: 13:00:00 via TOPICAL

## 2012-07-31 NOTE — Progress Notes (Signed)
Weekly Management Note:  Site: Left lung Current Dose:  1500  cGy Projected Dose: 3500  cGy  Narrative: The patient is seen today for routine under treatment assessment. CBCT/MVCT images/port films were reviewed. The chart was reviewed.   She is without complaints today. No respiratory issues.  Physical Examination:  Filed Vitals:   07/31/12 1107  BP: 149/87  Pulse: 77  Temp: 98.7 F (37.1 C)  Resp: 20  .  Weight: 113 lb 1.6 oz (51.302 kg). No significant skin changes. Lungs are clear.  Impression: Tolerating radiation therapy well.  Plan: Continue radiation therapy as planned.

## 2012-07-31 NOTE — Addendum Note (Signed)
Encounter addended by: Glennie Hawk, RN on: 07/31/2012 12:37 PM<BR>     Documentation filed: Inpatient MAR

## 2012-07-31 NOTE — Progress Notes (Addendum)
Pt denies pain, fatigue, loss of appetite. She has "tickle in my throat", nonprod cough, SOB w/exertion only. Post sim completed w/pt. Gave pt "Radiation and You" booklet w/all pertinent pages marked and discussed, re: fatigue, skin irritation/management, throat irritation/management, nutrition, pain. Gave pt Radiaplex lotion w/instructions for proper use. All questions answered.

## 2012-08-01 ENCOUNTER — Ambulatory Visit
Admission: RE | Admit: 2012-08-01 | Discharge: 2012-08-01 | Disposition: A | Discharge status: Home / Self Care | Payer: Medicare Other | Source: Ambulatory Visit | Attending: Radiation Oncology | Admitting: Radiation Oncology

## 2012-08-02 ENCOUNTER — Ambulatory Visit
Admission: RE | Admit: 2012-08-02 | Discharge: 2012-08-02 | Disposition: A | Discharge status: Home / Self Care | Payer: Medicare Other | Source: Ambulatory Visit | Attending: Radiation Oncology | Admitting: Radiation Oncology

## 2012-08-03 ENCOUNTER — Ambulatory Visit
Admission: RE | Admit: 2012-08-03 | Discharge: 2012-08-03 | Disposition: A | Discharge status: Home / Self Care | Payer: Medicare Other | Source: Ambulatory Visit | Attending: Radiation Oncology | Admitting: Radiation Oncology

## 2012-08-04 ENCOUNTER — Ambulatory Visit
Admission: RE | Admit: 2012-08-04 | Discharge: 2012-08-04 | Disposition: A | Discharge status: Home / Self Care | Payer: Medicare Other | Source: Ambulatory Visit | Attending: Radiation Oncology | Admitting: Radiation Oncology

## 2012-08-07 ENCOUNTER — Encounter: Payer: Self-pay | Admitting: Radiation Oncology

## 2012-08-07 ENCOUNTER — Ambulatory Visit
Admission: RE | Admit: 2012-08-07 | Discharge: 2012-08-07 | Disposition: A | Discharge status: Home / Self Care | Payer: Medicare Other | Source: Ambulatory Visit | Attending: Radiation Oncology | Admitting: Radiation Oncology

## 2012-08-07 ENCOUNTER — Inpatient Hospital Stay
Admission: RE | Admit: 2012-08-07 | Discharge: 2012-08-07 | Disposition: A | Discharge status: Home / Self Care | Payer: Self-pay | Source: Ambulatory Visit | Attending: Radiation Oncology | Admitting: Radiation Oncology

## 2012-08-07 ENCOUNTER — Other Ambulatory Visit: Payer: Medicare Other | Admitting: Lab

## 2012-08-07 VITALS — BP 142/86 | HR 92 | Temp 98.1°F | Resp 20 | Wt 114.4 lb

## 2012-08-07 DIAGNOSIS — C3492 Malignant neoplasm of unspecified part of left bronchus or lung: Secondary | ICD-10-CM

## 2012-08-07 MED ORDER — MAGIC MOUTHWASH W/LIDOCAINE: ORAL | Status: DC

## 2012-08-07 MED ORDER — RADIAPLEXRX EX GEL
Freq: Once | CUTANEOUS | Status: AC
  Administered 2012-08-07: 11:00:00 via TOPICAL

## 2012-08-07 NOTE — Progress Notes (Signed)
weekly rad tx lung,lt lung 11/14 completed, using radiaplex gel bid, no skin changes front of chest, slight erythema on back of chest, patient denies diff swallowing or chewing, has dry cough, drank boost and a few bites of sausage egg biscuit this am, patient c/o legs peeling flaky,,dry skin, thought this was from radiation, did discuss only on chest/back area where rad tx is given is where side effects of skin irritation would happen, patient is getting chemotherapy now 10:45 AM  10:44 AM

## 2012-08-07 NOTE — Progress Notes (Signed)
   Weekly Management Note:  outpatient Current Dose:  27.5 Gy  Projected Dose: 35 Gy   Narrative:  The patient presents for routine under treatment assessment.  CBCT/MVCT images/Port film x-rays were reviewed.  The chart was checked. Has some esophageal pain, tolerable. Second chemo cycle tomorrow. Dry cough, breathing is better  Physical Findings:  weight is 114 lb 6.4 oz (51.891 kg). Her oral temperature is 98.1 F (36.7 C). Her blood pressure is 142/86 and her pulse is 92. Her respiration is 20 and oxygen saturation is 98%.    Mild skin erythema  CBC    Component Value Date/Time   WBC 13.5* 07/31/2012 1005   RBC 4.93 07/31/2012 1005   HGB 13.0 07/31/2012 1005   HCT 39.9 07/31/2012 1005   PLT 238 07/31/2012 1005   MCV 80.9 07/31/2012 1005   MCH 26.4 07/31/2012 1005   MCHC 32.7 07/31/2012 1005   RDW 15.5* 07/31/2012 1005   LYMPHSABS 1.9 07/31/2012 1005   MONOABS 1.0* 07/31/2012 1005   EOSABS 0.2 07/31/2012 1005   BASOSABS 0.1 07/31/2012 1005     CMP     Component Value Date/Time   NA 136 07/31/2012 1005   NA 133* 06/27/2012 1706   K 4.5 07/31/2012 1005   K 4.5 06/27/2012 1706   CL 93* 07/10/2012 1544   CL 95* 06/27/2012 1706   CO2 29 07/31/2012 1005   CO2 25 06/27/2012 1706   GLUCOSE 101 07/31/2012 1005   GLUCOSE 134* 07/10/2012 1544   GLUCOSE 94 06/27/2012 1706   BUN 11.2 07/31/2012 1005   BUN 14 06/27/2012 1706   CREATININE 0.8 07/31/2012 1005   CREATININE 1.0 06/27/2012 1706   CALCIUM 9.5 07/31/2012 1005   CALCIUM 9.1 06/27/2012 1706   PROT 6.8 07/31/2012 1005   ALBUMIN 3.2* 07/31/2012 1005   AST 22 07/31/2012 1005   ALT 28 07/31/2012 1005   ALKPHOS 138 07/31/2012 1005   BILITOT <0.20 Repeated and Verified 07/31/2012 1005      Impression:  The patient is tolerating radiotherapy.  Plan:  Continue radiotherapy as planned.  F/u in 2-3 weeks to reassess for whole brain RT.  ________________________________   Lonie Peak, M.D.

## 2012-08-08 ENCOUNTER — Telehealth: Payer: Self-pay | Admitting: *Deleted

## 2012-08-08 ENCOUNTER — Other Ambulatory Visit: Payer: Self-pay | Admitting: Internal Medicine

## 2012-08-08 ENCOUNTER — Encounter: Payer: Self-pay | Admitting: Internal Medicine

## 2012-08-08 ENCOUNTER — Other Ambulatory Visit (HOSPITAL_BASED_OUTPATIENT_CLINIC_OR_DEPARTMENT_OTHER): Payer: Medicare Other | Admitting: Lab

## 2012-08-08 ENCOUNTER — Ambulatory Visit (HOSPITAL_BASED_OUTPATIENT_CLINIC_OR_DEPARTMENT_OTHER): Payer: Medicare Other

## 2012-08-08 ENCOUNTER — Ambulatory Visit
Admission: RE | Admit: 2012-08-08 | Discharge: 2012-08-08 | Disposition: A | Discharge status: Home / Self Care | Payer: Medicare Other | Source: Ambulatory Visit | Attending: Radiation Oncology | Admitting: Radiation Oncology

## 2012-08-08 ENCOUNTER — Ambulatory Visit (HOSPITAL_BASED_OUTPATIENT_CLINIC_OR_DEPARTMENT_OTHER): Payer: Medicare Other | Admitting: Internal Medicine

## 2012-08-08 ENCOUNTER — Other Ambulatory Visit: Payer: Medicare Other | Admitting: Lab

## 2012-08-08 ENCOUNTER — Telehealth: Payer: Self-pay | Admitting: Internal Medicine

## 2012-08-08 VITALS — BP 152/90 | HR 92 | Temp 98.3°F | Resp 18 | Ht 62.0 in | Wt 114.7 lb

## 2012-08-08 DIAGNOSIS — C787 Secondary malignant neoplasm of liver and intrahepatic bile duct: Secondary | ICD-10-CM

## 2012-08-08 DIAGNOSIS — Z5111 Encounter for antineoplastic chemotherapy: Secondary | ICD-10-CM

## 2012-08-08 DIAGNOSIS — C3490 Malignant neoplasm of unspecified part of unspecified bronchus or lung: Secondary | ICD-10-CM

## 2012-08-08 DIAGNOSIS — C3400 Malignant neoplasm of unspecified main bronchus: Secondary | ICD-10-CM

## 2012-08-08 DIAGNOSIS — C7931 Secondary malignant neoplasm of brain: Secondary | ICD-10-CM

## 2012-08-08 DIAGNOSIS — C3492 Malignant neoplasm of unspecified part of left bronchus or lung: Secondary | ICD-10-CM

## 2012-08-08 LAB — CBC WITH DIFFERENTIAL/PLATELET
Basophils Absolute: 0.1 10*3/uL (ref 0.0–0.1)
EOS%: 0.6 % (ref 0.0–7.0)
HGB: 13 g/dL (ref 11.6–15.9)
LYMPH%: 9.6 % — ABNORMAL LOW (ref 14.0–49.7)
MCH: 25.9 pg (ref 25.1–34.0)
MCV: 81.4 fL (ref 79.5–101.0)
MONO%: 11.4 % (ref 0.0–14.0)
Platelets: 218 10*3/uL (ref 145–400)
RBC: 5.01 10*6/uL (ref 3.70–5.45)
RDW: 15.7 % — ABNORMAL HIGH (ref 11.2–14.5)

## 2012-08-08 LAB — COMPREHENSIVE METABOLIC PANEL (CC13)
ALT: 24 U/L (ref 0–55)
Albumin: 3.3 g/dL — ABNORMAL LOW (ref 3.5–5.0)
CO2: 28 mEq/L (ref 22–29)
Calcium: 8.9 mg/dL (ref 8.4–10.4)
Chloride: 99 mEq/L (ref 98–109)
Creatinine: 0.8 mg/dL (ref 0.6–1.1)
Potassium: 4.1 mEq/L (ref 3.5–5.1)

## 2012-08-08 MED ORDER — SODIUM CHLORIDE 0.9 % IV SOLN
120.0000 mg/m2 | Freq: Once | INTRAVENOUS | Status: AC
  Administered 2012-08-08: 180 mg via INTRAVENOUS
  Filled 2012-08-08: qty 9

## 2012-08-08 MED ORDER — DEXAMETHASONE SODIUM PHOSPHATE 20 MG/5ML IJ SOLN
20.0000 mg | Freq: Once | INTRAMUSCULAR | Status: AC
  Administered 2012-08-08: 20 mg via INTRAVENOUS

## 2012-08-08 MED ORDER — HEPARIN SOD (PORK) LOCK FLUSH 100 UNIT/ML IV SOLN
500.0000 [IU] | Freq: Once | INTRAVENOUS | Status: AC | PRN
  Administered 2012-08-08: 500 [IU]
  Filled 2012-08-08: qty 5

## 2012-08-08 MED ORDER — SODIUM CHLORIDE 0.9 % IV SOLN
390.0000 mg | Freq: Once | INTRAVENOUS | Status: AC
  Administered 2012-08-08: 390 mg via INTRAVENOUS
  Filled 2012-08-08: qty 39

## 2012-08-08 MED ORDER — SODIUM CHLORIDE 0.9 % IV SOLN
Freq: Once | INTRAVENOUS | Status: AC
  Administered 2012-08-08: 10:00:00 via INTRAVENOUS

## 2012-08-08 MED ORDER — SODIUM CHLORIDE 0.9 % IJ SOLN
10.0000 mL | INTRAMUSCULAR | Status: DC | PRN
  Administered 2012-08-08: 10 mL
  Filled 2012-08-08: qty 10

## 2012-08-08 MED ORDER — ONDANSETRON 16 MG/50ML IVPB (CHCC)
16.0000 mg | Freq: Once | INTRAVENOUS | Status: AC
  Administered 2012-08-08: 16 mg via INTRAVENOUS

## 2012-08-08 NOTE — Telephone Encounter (Signed)
Gave pt appt for lab and MD for july and August 2014 lab , Md, ct and chemo, gave pt oral contrast

## 2012-08-08 NOTE — Telephone Encounter (Signed)
Per staff message and POF I have scheduled appts.  JMW  

## 2012-08-08 NOTE — Progress Notes (Signed)
[facility] Telephone:([phone]) [phone]   [phone]  OFFICE PROGRESS NOTE  No PCP Per Patient [address] [place] [address]  DIAGNOSIS: Extensive stage small cell lung cancer with large obstructing Center left lung mass with large left pleural effusion, liver and brain metastasis diagnosed in June of 2014   PRIOR THERAPY: None   CURRENT THERAPY: Systemic chemotherapy with carboplatin for AUC of 5 on day 1 and etoposide 120 mg/M2 on days 1, 2 and 3 with Neulasta support on day 4. First cycle starts today 07/18/2012.    INTERVAL HISTORY: Cheryl Nielsen 72 y.o. female returns to the clinic today for followup visit accompanied by her daughter. The patient is feeling fine today with no specific complaints. She tolerated the first cycle of her systemic chemotherapy with carboplatin and etoposide fairly well. She denied having any significant fever or chills. The patient denied having any nausea or vomiting. She denied having any significant chest pain and has improvement in her presenting with no cough or hemoptysis.   MEDICAL HISTORY: Past Medical History  Diagnosis Date  . Pneumonia, organism unspecified   . Lung cancer 07/05/12    Left Mainstem Bronchus- Small Cell Carcinoma    ALLERGIES:  is allergic to asa and codeine.  MEDICATIONS:  Current Outpatient Prescriptions  Medication Sig Dispense Refill  . acetaminophen (TYLENOL) 500 MG tablet Take 1,000 mg by mouth every 6 (six) hours as needed (For headache.).      Marland Kitchen Alum & Mag Hydroxide-Simeth (MAGIC MOUTHWASH W/LIDOCAINE) SOLN 1 part benadryl, 1 part nystatin, 1 part cherry extra strength Maalox Plus, 3 parts 2% viscous lidocaine. Swallow 10mL up to QID, 30 min before meals, for soreness in esophagus.  480 mL  2  . glucosamine-chondroitin 500-400 MG tablet Take 1 tablet by mouth daily with lunch.       . hyaluronate sodium (RADIAPLEXRX) GEL Apply topically 2 (two) times daily.      Marland Kitchen  lidocaine-prilocaine (EMLA) cream Apply 1 application topically daily as needed (Applies to port-a-cath.).      Marland Kitchen Multiple Vitamin (MULTIVITAMIN WITH MINERALS) TABS Take 1 tablet by mouth daily with lunch. She takes Surveyor, quantity Adult 50+.      . prochlorperazine (COMPAZINE) 10 MG tablet Take 10 mg by mouth every 6 (six) hours as needed (For nausea.).      Marland Kitchen ranitidine (ZANTAC) 150 MG tablet Take 150 mg by mouth daily as needed for heartburn.       . ondansetron (ZOFRAN) 4 MG tablet Take 4 mg by mouth every 4 (four) hours as needed for nausea.       No current facility-administered medications for this visit.    SURGICAL HISTORY:  Past Surgical History  Procedure Laterality Date  . Nasal sinus surgery    . Video bronchoscopy Bilateral 07/05/2012    Procedure: VIDEO BRONCHOSCOPY WITHOUT FLUORO;  Surgeon: Nyoka Cowden, MD;  Location: [facility];  Service: Cardiopulmonary;  Laterality: Bilateral;    REVIEW OF SYSTEMS:  A comprehensive review of systems was negative except for: Constitutional: positive for fatigue Respiratory: positive for dyspnea on exertion   PHYSICAL EXAMINATION: General appearance: alert, cooperative and no distress Head: Normocephalic, without obvious abnormality, atraumatic Neck: no adenopathy Lymph nodes: Cervical, supraclavicular, and axillary nodes normal. Resp: clear to auscultation bilaterally Cardio: regular rate and rhythm, S1, S2 normal, no murmur, click, rub or gallop GI: soft, non-tender; bowel sounds normal; no masses,  no organomegaly Extremities: extremities  normal, atraumatic, no cyanosis or edema Neurologic: Alert and oriented X 3, normal strength and tone. Normal symmetric reflexes. Normal coordination and gait  ECOG PERFORMANCE STATUS: 1 - Symptomatic but completely ambulatory  Blood pressure 152/90, pulse 92, temperature 98.3 F (36.8 C), temperature source Oral, resp. rate 18, height 5\' 2"  (1.575 m), weight 114 lb 11.2 oz (52.028  kg).  LABORATORY DATA: Lab Results  Component Value Date   WBC 8.4 08/08/2012   HGB 13.0 08/08/2012   HCT 40.8 08/08/2012   MCV 81.4 08/08/2012   PLT 218 08/08/2012      Chemistry      Component Value Date/Time   NA 136 07/31/2012 1005   NA 133* 06/27/2012 1706   K 4.5 07/31/2012 1005   K 4.5 06/27/2012 1706   CL 93* 07/10/2012 1544   CL 95* 06/27/2012 1706   CO2 29 07/31/2012 1005   CO2 25 06/27/2012 1706   BUN 11.2 07/31/2012 1005   BUN 14 06/27/2012 1706   CREATININE 0.8 07/31/2012 1005   CREATININE 1.0 06/27/2012 1706      Component Value Date/Time   CALCIUM 9.5 07/31/2012 1005   CALCIUM 9.1 06/27/2012 1706   ALKPHOS 138 07/31/2012 1005   AST 22 07/31/2012 1005   ALT 28 07/31/2012 1005   BILITOT <0.20 Repeated and Verified 07/31/2012 1005       RADIOGRAPHIC STUDIES: Ct Chest W Contrast  07/14/2012   *RADIOLOGY REPORT*  Clinical Data:  New diagnosis of lung cancer.  CT CHEST, ABDOMEN AND PELVIS WITH CONTRAST  Technique:  Multidetector CT imaging of the chest, abdomen and pelvis was performed following the standard protocol during bolus administration of intravenous contrast.  Contrast: 80mL OMNIPAQUE IOHEXOL 300 MG/ML  SOLN  Comparison:  06/27/2012  CT CHEST  Findings:  Large infiltrating left hilar tumor invades the mediastinum and is difficult to differentiate from the heterogeneous "drowned lung" appearance of the left lung which is somewhat heterogeneous.  The mass measures about 8.5 cm anterior - posterior, up to 8 cm transverse, and extends around the circumference of the left pulmonary artery, into the AP window, and extends around the lower trachea were is confluent with subcarinal pathologic adenopathy with short axis diameter 2.3 cm.  Narrowing of the left pulmonary artery may be due to the lack of aeration in the left lung.  Branching low density in the left lung is primarily thought to be due to plugged airways although there may be satellite tumors in the left lung as well.  Left  pleural effusion observed, likely malignant with lateral nodularity along the pleural surface.  There is a pericardial effusion which likewise may be malignant given the degree of mediastinal invasion.  Left mainstem bronchus is abruptly truncated by tumor and frothy material  Mildly prominent right hilar lymph node 1.1 cm.  Prevascular nodes include a 1.6 cm short axis node on image 14 of series 2 and a 1.2 cm node on image 15 of series 2  Emphysema noted. 0.9 x 0.5 cm centrally calcified nodule in the apical segment right upper lobe, image 11 of series 4.  4 mm ground- glass densities subpleural nodule right lower lobe, image 34 of series 4.  Multiple nodules in the 2-3 mm range are present in the right lung.  Rounded density in the right proximal humeral head measuring 9 mm in diameter could conceivably represent a small metastatic lesion.  IMPRESSION:  1.  Large left hilar mass with extensive mediastinal invasion and invasion into  adjacent left upper lobe and lower lobe. Pulmonary extent of mass is indistinct due to similar density with the "drowned lung" adjacent to it.  Left pleural effusion, likely malignant.  Pericardial effusion, possibly malignant. Mediastinal adenopathy and mild right hilar adenopathy observed. 2.  Emphysema. 3.  Possible small metastatic lesion in the right proximal humeral head.  CT ABDOMEN AND PELVIS  Findings:  Various metastatic lesions are scattered throughout the liver.  An index lesion in segment 6 measures 3.3 x 2.4 cm on image 68 of series 2.  Over 20 lesions are present in the liver.  Heterogeneous splenic enhancement is attributed to arterial phase of contrast.  2.9 x 1.9 cm mass in the left adrenal gland, likely metastatic.  2.8 x 2.0 cm mass in the right adrenal gland, likely metastatic.  Scattered small just aortic nodes at the level of the hiatus.  Several small renal cysts are present bilaterally.  The pancreas unremarkable.  Portal vein and splenic vein patent.  Fluid  density left inguinal lesion, potentially a hydrocele and inguinal hernia or ganglion cyst.  Urinary bladder unremarkable. The uterus and adnexa appear unremarkable.  The appendix unremarkable.  IMPRESSION:  1.  Scattered metastatic lesions throughout the liver. 2.  Bilateral adrenal masses likely represent metastatic disease. Scattered small periaortic nodes at the aortic hiatus, likely malignant. 3.  Small hydrocele or ganglion cyst in the left inguinal region.   Original Report Authenticated By: Gaylyn Rong, M.D.   Mr Cheryl Nielsen Wo Contrast  07/15/2012   *RADIOLOGY REPORT*  Clinical Data: The diagnosis of lung cancer.  Staging.  Nausea.  MRI HEAD WITHOUT AND WITH CONTRAST  Technique:  Multiplanar, multiecho pulse sequences of the brain and surrounding structures were obtained according to standard protocol without and with intravenous contrast  Contrast: 10mL MULTIHANCE GADOBENATE DIMEGLUMINE 529 MG/ML IV SOLN  Comparison: None.  Findings: I can identify six metastatic lesions within the brain. The largest lesion is 810 mm metastasis in the subcortical white matter of the right parietal region near the vertex.  There is mild vasogenic edema.  There is a 4 mm metastasis in the left frontal lobe without edema.  There are four cerebellar metastases, the largest in the right cerebellum laterally measuring 5 mm.  No evidence of ischemic infarction.  No evidence of hemorrhage, hydrocephalus or extra-axial collection.  No pituitary mass.  No inflammatory sinus disease.  No skull or skull base lesion. Incidental small nasopharyngeal cyst noted.  IMPRESSION: Six brain metastases identified, the largest in the right parietal subcortical white matter measuring 10 mm with mild vasogenic edema. See above for full discussion.   Original Report Authenticated By: Paulina Fusi, M.D.   Ct Abdomen Pelvis W Contrast  07/14/2012   *RADIOLOGY REPORT*  Clinical Data:  New diagnosis of lung cancer.  CT CHEST, ABDOMEN AND PELVIS  WITH CONTRAST  Technique:  Multidetector CT imaging of the chest, abdomen and pelvis was performed following the standard protocol during bolus administration of intravenous contrast.  Contrast: 80mL OMNIPAQUE IOHEXOL 300 MG/ML  SOLN  Comparison:  06/27/2012  CT CHEST  Findings:  Large infiltrating left hilar tumor invades the mediastinum and is difficult to differentiate from the heterogeneous "drowned lung" appearance of the left lung which is somewhat heterogeneous.  The mass measures about 8.5 cm anterior - posterior, up to 8 cm transverse, and extends around the circumference of the left pulmonary artery, into the AP window, and extends around the lower trachea were is confluent with subcarinal pathologic  adenopathy with short axis diameter 2.3 cm.  Narrowing of the left pulmonary artery may be due to the lack of aeration in the left lung.  Branching low density in the left lung is primarily thought to be due to plugged airways although there may be satellite tumors in the left lung as well.  Left pleural effusion observed, likely malignant with lateral nodularity along the pleural surface.  There is a pericardial effusion which likewise may be malignant given the degree of mediastinal invasion.  Left mainstem bronchus is abruptly truncated by tumor and frothy material  Mildly prominent right hilar lymph node 1.1 cm.  Prevascular nodes include a 1.6 cm short axis node on image 14 of series 2 and a 1.2 cm node on image 15 of series 2  Emphysema noted. 0.9 x 0.5 cm centrally calcified nodule in the apical segment right upper lobe, image 11 of series 4.  4 mm ground- glass densities subpleural nodule right lower lobe, image 34 of series 4.  Multiple nodules in the 2-3 mm range are present in the right lung.  Rounded density in the right proximal humeral head measuring 9 mm in diameter could conceivably represent a small metastatic lesion.  IMPRESSION:  1.  Large left hilar mass with extensive mediastinal invasion  and invasion into adjacent left upper lobe and lower lobe. Pulmonary extent of mass is indistinct due to similar density with the "drowned lung" adjacent to it.  Left pleural effusion, likely malignant.  Pericardial effusion, possibly malignant. Mediastinal adenopathy and mild right hilar adenopathy observed. 2.  Emphysema. 3.  Possible small metastatic lesion in the right proximal humeral head.  CT ABDOMEN AND PELVIS  Findings:  Various metastatic lesions are scattered throughout the liver.  An index lesion in segment 6 measures 3.3 x 2.4 cm on image 68 of series 2.  Over 20 lesions are present in the liver.  Heterogeneous splenic enhancement is attributed to arterial phase of contrast.  2.9 x 1.9 cm mass in the left adrenal gland, likely metastatic.  2.8 x 2.0 cm mass in the right adrenal gland, likely metastatic.  Scattered small just aortic nodes at the level of the hiatus.  Several small renal cysts are present bilaterally.  The pancreas unremarkable.  Portal vein and splenic vein patent.  Fluid density left inguinal lesion, potentially a hydrocele and inguinal hernia or ganglion cyst.  Urinary bladder unremarkable. The uterus and adnexa appear unremarkable.  The appendix unremarkable.  IMPRESSION:  1.  Scattered metastatic lesions throughout the liver. 2.  Bilateral adrenal masses likely represent metastatic disease. Scattered small periaortic nodes at the aortic hiatus, likely malignant. 3.  Small hydrocele or ganglion cyst in the left inguinal region.   Original Report Authenticated By: Gaylyn Rong, M.D.   Ir Fluoro Guide Cv Line Right  07/12/2012   *RADIOLOGY REPORT*  Indication: History of lung cancer, in need of intravenous access for chemotherapy administration  IMPLANTED PORT A CATH PLACEMENT WITH ULTRASOUND AND FLUOROSCOPIC GUIDANCE  Comparison: Chest radiograph - 06/27/2012  Sedation: Versed 1.5 mg IV; Fentanyl 75 mcg IV; Zofran 4 mg IV; Ancef 2 gm IV; IV antibiotic was given in an  appropriate time interval prior to skin puncture.  Total Moderate Sedation Time: 25 minutes.  Contrast: None  Fluoroscopy Time: 30 seconds  Complications: None immediate  Procedure:  The procedure, risks, benefits, and alternatives were explained to the patient.  Questions regarding the procedure were encouraged and answered.  The patient understands and consents to the  procedure.  The right neck and chest were prepped with chlorhexidine in a sterile fashion, and a sterile drape was applied covering the operative field.  Maximum barrier sterile technique with sterile gowns and gloves were used for the procedure.  A timeout was performed prior to the initiation of the procedure.  Local anesthesia was provided with 1% lidocaine with epinephrine.  After creating a small venotomy incision, a micropuncture kit was utilized to access the right internal jugular vein under direct, real-time ultrasound guidance.  Ultrasound image documentation was performed.  The microwire was kinked to measure appropriate catheter length.  A subcutaneous port pocket was then created along the upper chest wall utilizing a combination of sharp and blunt dissection.  The pocket was irrigated with sterile saline.  A single lumen ISP power injectable port was chosen for placement.  The 8 Fr catheter was tunneled from the port pocket site to the venotomy incision.  The port was placed in the pocket.  The external catheter was trimmed to appropriate length.  At the venotomy, an 8 Fr peel-away sheath was placed over a guidewire under fluoroscopic guidance.  The catheter was then placed through the sheath and the sheath was removed.  Final catheter positioning was confirmed and documented with a fluoroscopic spot radiograph.  The port was accessed with a Huber needle, aspirated and flushed with heparinized saline.  The venotomy site was closed with an interrupted 4-0 Vicryl suture. The port pocket incision was closed with interrupted 2-0 Vicryl  suture and the skin was opposed with a running subcuticular 4-0 Vicryl suture.  Dermabond and Steri-strips were applied to both incisions.  Dressings were placed.  The patient tolerated the procedure well without immediate post procedural complication.  Findings:  After catheter placement, the tip lies at the superior cavoatrial junction.  The catheter aspirates and flushes normally and is ready for immediate use.  Note is again made of near complete opacification of the left hemithorax, similar to recently performed chest radiograph.  IMPRESSION:  Successful placement of a right internal jugular approach single lumen power injectable Port-A-Cath.  The catheter is ready for immediate use.   Original Report Authenticated By: Tacey Ruiz, MD   Ir US Guide Vasc Access Right  07/12/2012   *RADIOLOGY REPORT*  Indication: History of lung cancer, in need of intravenous access for chemotherapy administration  IMPLANTED PORT A CATH PLACEMENT WITH ULTRASOUND AND FLUOROSCOPIC GUIDANCE  Comparison: Chest radiograph - 06/27/2012  Sedation: Versed 1.5 mg IV; Fentanyl 75 mcg IV; Zofran 4 mg IV; Ancef 2 gm IV; IV antibiotic was given in an appropriate time interval prior to skin puncture.  Total Moderate Sedation Time: 25 minutes.  Contrast: None  Fluoroscopy Time: 30 seconds  Complications: None immediate  Procedure:  The procedure, risks, benefits, and alternatives were explained to the patient.  Questions regarding the procedure were encouraged and answered.  The patient understands and consents to the procedure.  The right neck and chest were prepped with chlorhexidine in a sterile fashion, and a sterile drape was applied covering the operative field.  Maximum barrier sterile technique with sterile gowns and gloves were used for the procedure.  A timeout was performed prior to the initiation of the procedure.  Local anesthesia was provided with 1% lidocaine with epinephrine.  After creating a small venotomy incision, a  micropuncture kit was utilized to access the right internal jugular vein under direct, real-time ultrasound guidance.  Ultrasound image documentation was performed.  The microwire was kinked  to measure appropriate catheter length.  A subcutaneous port pocket was then created along the upper chest wall utilizing a combination of sharp and blunt dissection.  The pocket was irrigated with sterile saline.  A single lumen ISP power injectable port was chosen for placement.  The 8 Fr catheter was tunneled from the port pocket site to the venotomy incision.  The port was placed in the pocket.  The external catheter was trimmed to appropriate length.  At the venotomy, an 8 Fr peel-away sheath was placed over a guidewire under fluoroscopic guidance.  The catheter was then placed through the sheath and the sheath was removed.  Final catheter positioning was confirmed and documented with a fluoroscopic spot radiograph.  The port was accessed with a Huber needle, aspirated and flushed with heparinized saline.  The venotomy site was closed with an interrupted 4-0 Vicryl suture. The port pocket incision was closed with interrupted 2-0 Vicryl suture and the skin was opposed with a running subcuticular 4-0 Vicryl suture.  Dermabond and Steri-strips were applied to both incisions.  Dressings were placed.  The patient tolerated the procedure well without immediate post procedural complication.  Findings:  After catheter placement, the tip lies at the superior cavoatrial junction.  The catheter aspirates and flushes normally and is ready for immediate use.  Note is again made of near complete opacification of the left hemithorax, similar to recently performed chest radiograph.  IMPRESSION:  Successful placement of a right internal jugular approach single lumen power injectable Port-A-Cath.  The catheter is ready for immediate use.   Original Report Authenticated By: Tacey Ruiz, MD    ASSESSMENT AND PLAN: This is a very pleasant  72 years old white female with extensive stage small cell lung cancer currently undergoing systemic chemotherapy with carboplatin and etoposide status post 1 cycle. The patient related the first cycle of her treatment fairly well with no significant adverse effects. We'll proceed today with cycle #2 of chemotherapy as scheduled. The patient would come back for followup visit in 3 weeks with repeat CT scan of the chest, abdomen and pelvis for restaging of her disease. I will delay the start of cycle #3 x 2 weeks to give the patient an opportunity to have her whole brain irradiation completed under the care of Dr. Basilio Cairo. She was advised to call immediately if she has any concerning symptoms in the interval.  All questions were answered. The patient knows to call the clinic with any problems, questions or concerns. We can certainly see the patient much sooner if necessary.  I spent 15 minutes counseling the patient face to face. The total time spent in the appointment was 25 minutes.

## 2012-08-08 NOTE — Patient Instructions (Addendum)
Continue chemotherapy today as scheduled.  Followup visit in 3 weeks with repeat CT scan of the chest, abdomen and pelvis. 

## 2012-08-08 NOTE — Patient Instructions (Addendum)
[facility] Discharge Instructions for Patients Receiving Chemotherapy  Today you received the following chemotherapy agents carboplatin, etoposide  To help prevent nausea and vomiting after your treatment, we encourage you to take your nausea medication if needed   If you develop nausea and vomiting that is not controlled by your nausea medication, call the clinic.   BELOW ARE SYMPTOMS THAT SHOULD BE REPORTED IMMEDIATELY:  *FEVER GREATER THAN 100.5 F  *CHILLS WITH OR WITHOUT FEVER  NAUSEA AND VOMITING THAT IS NOT CONTROLLED WITH YOUR NAUSEA MEDICATION  *UNUSUAL SHORTNESS OF BREATH  *UNUSUAL BRUISING OR BLEEDING  TENDERNESS IN MOUTH AND THROAT WITH OR WITHOUT PRESENCE OF ULCERS  *URINARY PROBLEMS  *BOWEL PROBLEMS  UNUSUAL RASH Items with * indicate a potential emergency and should be followed up as soon as possible.  Feel free to call the clinic you have any questions or concerns. The clinic phone number is [phone].

## 2012-08-09 ENCOUNTER — Ambulatory Visit
Admission: RE | Admit: 2012-08-09 | Discharge: 2012-08-09 | Disposition: A | Discharge status: Home / Self Care | Payer: Medicare Other | Source: Ambulatory Visit | Attending: Radiation Oncology | Admitting: Radiation Oncology

## 2012-08-09 ENCOUNTER — Telehealth: Payer: Self-pay | Admitting: Medical Oncology

## 2012-08-09 ENCOUNTER — Ambulatory Visit (HOSPITAL_BASED_OUTPATIENT_CLINIC_OR_DEPARTMENT_OTHER): Payer: Medicare Other

## 2012-08-09 ENCOUNTER — Other Ambulatory Visit: Payer: Self-pay | Admitting: Medical Oncology

## 2012-08-09 ENCOUNTER — Telehealth: Payer: Self-pay | Admitting: *Deleted

## 2012-08-09 VITALS — BP 146/90 | HR 113 | Temp 101.7°F

## 2012-08-09 DIAGNOSIS — C787 Secondary malignant neoplasm of liver and intrahepatic bile duct: Secondary | ICD-10-CM

## 2012-08-09 DIAGNOSIS — C3400 Malignant neoplasm of unspecified main bronchus: Secondary | ICD-10-CM

## 2012-08-09 DIAGNOSIS — R509 Fever, unspecified: Secondary | ICD-10-CM

## 2012-08-09 DIAGNOSIS — C7949 Secondary malignant neoplasm of other parts of nervous system: Secondary | ICD-10-CM

## 2012-08-09 DIAGNOSIS — C3492 Malignant neoplasm of unspecified part of left bronchus or lung: Secondary | ICD-10-CM

## 2012-08-09 DIAGNOSIS — Z5111 Encounter for antineoplastic chemotherapy: Secondary | ICD-10-CM

## 2012-08-09 MED ORDER — DIPHENHYDRAMINE HCL 25 MG PO CAPS
25.0000 mg | ORAL_CAPSULE | Freq: Once | ORAL | Status: AC
  Administered 2012-08-09: 25 mg via ORAL

## 2012-08-09 MED ORDER — ACETAMINOPHEN 325 MG PO TABS
650.0000 mg | ORAL_TABLET | Freq: Once | ORAL | Status: AC
  Administered 2012-08-09: 650 mg via ORAL

## 2012-08-09 MED ORDER — SODIUM CHLORIDE 0.9 % IJ SOLN
10.0000 mL | INTRAMUSCULAR | Status: DC | PRN
  Administered 2012-08-09: 10 mL
  Filled 2012-08-09: qty 10

## 2012-08-09 MED ORDER — CIPROFLOXACIN HCL 500 MG PO TABS: 500.0000 mg | ORAL_TABLET | Freq: Two times a day (BID) | ORAL | Status: DC

## 2012-08-09 MED ORDER — SODIUM CHLORIDE 0.9 % IV SOLN
Freq: Once | INTRAVENOUS | Status: AC
  Administered 2012-08-09: 12:00:00 via INTRAVENOUS

## 2012-08-09 MED ORDER — ACETAMINOPHEN 325 MG PO TABS: 650.0000 mg | ORAL_TABLET | ORAL | Status: DC

## 2012-08-09 MED ORDER — HEPARIN SOD (PORK) LOCK FLUSH 100 UNIT/ML IV SOLN
500.0000 [IU] | Freq: Once | INTRAVENOUS | Status: AC | PRN
  Administered 2012-08-09: 500 [IU]
  Filled 2012-08-09: qty 5

## 2012-08-09 MED ORDER — DEXAMETHASONE SODIUM PHOSPHATE 10 MG/ML IJ SOLN
10.0000 mg | Freq: Once | INTRAMUSCULAR | Status: AC
  Administered 2012-08-09: 10 mg via INTRAVENOUS

## 2012-08-09 MED ORDER — SODIUM CHLORIDE 0.9 % IV SOLN
120.0000 mg/m2 | Freq: Once | INTRAVENOUS | Status: AC
  Administered 2012-08-09: 180 mg via INTRAVENOUS
  Filled 2012-08-09: qty 9

## 2012-08-09 MED ORDER — ONDANSETRON 8 MG/50ML IVPB (CHCC)
8.0000 mg | Freq: Once | INTRAVENOUS | Status: AC
  Administered 2012-08-09: 8 mg via INTRAVENOUS

## 2012-08-09 NOTE — Telephone Encounter (Signed)
Shaking chills prior to bath today and afterwards. Temp 97.0. No dyspnea, dizziness or s/s of infection. Have her warming up now and getting better. Due for RT/day #2 chemo today at 1030. Instructed her to check temp again before leaving the house and MD will be made aware of the episode as well as treatment nurse so she can evaluate her upon arrival.

## 2012-08-09 NOTE — Telephone Encounter (Signed)
triage nurse talked to pt.

## 2012-08-09 NOTE — Progress Notes (Signed)
After treatment today, the patient's temperature was 101.7. Dr. Arbutus Ped notified. Verbal orders received. Gave patient Tylenol 650 mg po before discharge. A prescription for antibiotics will be called in to patient's pharmacy. Daughter and patient verbalized understanding.

## 2012-08-09 NOTE — Progress Notes (Signed)
Per Dr. Arbutus Ped, its OK to treat today with the temperature and rash on back. Verbal order given for po Benadryl 25 mg.

## 2012-08-09 NOTE — Patient Instructions (Addendum)
[facility] Discharge Instructions for Patients Receiving Chemotherapy  Today you received the following chemotherapy agents: Etoposide.  To help prevent nausea and vomiting after your treatment, we encourage you to take your nausea medication as prescribed.   If you develop nausea and vomiting that is not controlled by your nausea medication, call the clinic.   BELOW ARE SYMPTOMS THAT SHOULD BE REPORTED IMMEDIATELY:  *FEVER GREATER THAN 100.5 F  *CHILLS WITH OR WITHOUT FEVER  NAUSEA AND VOMITING THAT IS NOT CONTROLLED WITH YOUR NAUSEA MEDICATION  *UNUSUAL SHORTNESS OF BREATH  *UNUSUAL BRUISING OR BLEEDING  TENDERNESS IN MOUTH AND THROAT WITH OR WITHOUT PRESENCE OF ULCERS  *URINARY PROBLEMS  *BOWEL PROBLEMS  UNUSUAL RASH Items with * indicate a potential emergency and should be followed up as soon as possible.  Feel free to call the clinic you have any questions or concerns. The clinic phone number is [phone].    

## 2012-08-10 ENCOUNTER — Ambulatory Visit: Payer: Medicare Other

## 2012-08-10 ENCOUNTER — Encounter: Payer: Self-pay | Admitting: Radiation Oncology

## 2012-08-10 ENCOUNTER — Ambulatory Visit (HOSPITAL_BASED_OUTPATIENT_CLINIC_OR_DEPARTMENT_OTHER): Payer: Medicare Other

## 2012-08-10 ENCOUNTER — Ambulatory Visit
Admission: RE | Admit: 2012-08-10 | Discharge: 2012-08-10 | Disposition: A | Discharge status: Home / Self Care | Payer: Medicare Other | Source: Ambulatory Visit | Attending: Radiation Oncology | Admitting: Radiation Oncology

## 2012-08-10 VITALS — BP 141/89 | HR 77 | Temp 98.5°F | Resp 18

## 2012-08-10 DIAGNOSIS — Z5111 Encounter for antineoplastic chemotherapy: Secondary | ICD-10-CM

## 2012-08-10 DIAGNOSIS — C3400 Malignant neoplasm of unspecified main bronchus: Secondary | ICD-10-CM

## 2012-08-10 DIAGNOSIS — C3492 Malignant neoplasm of unspecified part of left bronchus or lung: Secondary | ICD-10-CM

## 2012-08-10 MED ORDER — HEPARIN SOD (PORK) LOCK FLUSH 100 UNIT/ML IV SOLN
500.0000 [IU] | Freq: Once | INTRAVENOUS | Status: AC | PRN
  Administered 2012-08-10: 500 [IU]
  Filled 2012-08-10: qty 5

## 2012-08-10 MED ORDER — SODIUM CHLORIDE 0.9 % IV SOLN
120.0000 mg/m2 | Freq: Once | INTRAVENOUS | Status: AC
  Administered 2012-08-10: 180 mg via INTRAVENOUS
  Filled 2012-08-10: qty 9

## 2012-08-10 MED ORDER — SODIUM CHLORIDE 0.9 % IV SOLN
Freq: Once | INTRAVENOUS | Status: AC
  Administered 2012-08-10: 11:00:00 via INTRAVENOUS

## 2012-08-10 MED ORDER — SODIUM CHLORIDE 0.9 % IJ SOLN
10.0000 mL | INTRAMUSCULAR | Status: DC | PRN
  Administered 2012-08-10: 10 mL
  Filled 2012-08-10: qty 10

## 2012-08-10 MED ORDER — DEXAMETHASONE SODIUM PHOSPHATE 10 MG/ML IJ SOLN
10.0000 mg | Freq: Once | INTRAMUSCULAR | Status: AC
  Administered 2012-08-10: 10 mg via INTRAVENOUS

## 2012-08-10 MED ORDER — ONDANSETRON 8 MG/50ML IVPB (CHCC)
8.0000 mg | Freq: Once | INTRAVENOUS | Status: AC
  Administered 2012-08-10: 8 mg via INTRAVENOUS

## 2012-08-10 NOTE — Patient Instructions (Addendum)
[facility] Discharge Instructions for Patients Receiving Chemotherapy  Today you received the following chemotherapy agents Etoposide.  To help prevent nausea and vomiting after your treatment, we encourage you to take your nausea medication if needed.   If you develop nausea and vomiting that is not controlled by your nausea medication, call the clinic.   BELOW ARE SYMPTOMS THAT SHOULD BE REPORTED IMMEDIATELY:  *FEVER GREATER THAN 100.5 F  *CHILLS WITH OR WITHOUT FEVER  NAUSEA AND VOMITING THAT IS NOT CONTROLLED WITH YOUR NAUSEA MEDICATION  *UNUSUAL SHORTNESS OF BREATH  *UNUSUAL BRUISING OR BLEEDING  TENDERNESS IN MOUTH AND THROAT WITH OR WITHOUT PRESENCE OF ULCERS  *URINARY PROBLEMS  *BOWEL PROBLEMS  UNUSUAL RASH Items with * indicate a potential emergency and should be followed up as soon as possible.  Feel free to call the clinic you have any questions or concerns. The clinic phone number is [phone].

## 2012-08-11 ENCOUNTER — Ambulatory Visit (HOSPITAL_BASED_OUTPATIENT_CLINIC_OR_DEPARTMENT_OTHER): Payer: Medicare Other

## 2012-08-11 VITALS — BP 141/71 | HR 89 | Temp 98.2°F

## 2012-08-11 DIAGNOSIS — C3400 Malignant neoplasm of unspecified main bronchus: Secondary | ICD-10-CM

## 2012-08-11 DIAGNOSIS — C787 Secondary malignant neoplasm of liver and intrahepatic bile duct: Secondary | ICD-10-CM

## 2012-08-11 DIAGNOSIS — Z5189 Encounter for other specified aftercare: Secondary | ICD-10-CM

## 2012-08-11 DIAGNOSIS — C3492 Malignant neoplasm of unspecified part of left bronchus or lung: Secondary | ICD-10-CM

## 2012-08-11 DIAGNOSIS — C7931 Secondary malignant neoplasm of brain: Secondary | ICD-10-CM

## 2012-08-11 MED ORDER — PEGFILGRASTIM INJECTION 6 MG/0.6ML
6.0000 mg | Freq: Once | SUBCUTANEOUS | Status: AC
  Administered 2012-08-11: 6 mg via SUBCUTANEOUS
  Filled 2012-08-11: qty 0.6

## 2012-08-13 NOTE — Progress Notes (Signed)
  Radiation Oncology         (336) [phone] ________________________________  Name: Cheryl Nielsen MRN: [id number]  Date: 08/10/2012  DOB: 16-Oct-1940  End of Treatment Note  Diagnosis:   Stage IV Small Cell Lung Cancer    Indication for treatment:  palliative       Radiation treatment dates:  07/24/2012-08/10/2012  Site/dose:   Mediastinum and Left Hilum / 35 Gy in 14 fractions  Beams/energy:   3 fields / 6 MV photons  Narrative: The patient tolerated radiation treatment relatively well.  Breathing improved. She developed mild esophagitis.  Plan: The patient has completed radiation treatment. The patient will return to radiation oncology clinic for routine followup 2 weeks to discuss whole brain radiotherapy in light of asymptomatic brain metastases. I advised them to call or return sooner if they have any questions or concerns related to their recovery or treatment.  -----------------------------------  Lonie Peak, MD

## 2012-08-14 ENCOUNTER — Telehealth: Payer: Self-pay | Admitting: *Deleted

## 2012-08-14 ENCOUNTER — Other Ambulatory Visit: Payer: Medicare Other

## 2012-08-14 NOTE — Telephone Encounter (Signed)
Cheryl Nielsen called requesting diagnosis codes.  162.9, 198.3 and 162.5 noted on problem list and given at this time.

## 2012-08-16 ENCOUNTER — Encounter: Payer: Self-pay | Admitting: Radiation Oncology

## 2012-08-16 ENCOUNTER — Telehealth: Payer: Self-pay | Admitting: *Deleted

## 2012-08-16 NOTE — Telephone Encounter (Signed)
CBC CMET dated 08/14/12 drawn at [facility] in [place] given to Dr Donnald Garre to review.  SLJ

## 2012-08-17 ENCOUNTER — Encounter: Payer: Self-pay | Admitting: *Deleted

## 2012-08-17 NOTE — Progress Notes (Unsigned)
[place] called and spoke to me as the on-call physician. The patient continues to have esophagitis. I wrote a couple of days ago for her to begin using Carafate. They feel that this has helped but the pain resumed a little that later today and they remain concerned about this.  The patient had 3 weeks of radiotherapy to the chest and I believe that her esophagitis should begin to improve over the next week or 2. We will continue Carafate and the patient will also begin taking Nexium. Nursing will contact the patient tomorrow to see how she is doing. She does have an appointment with Dr. Basilio Cairo next week. If we need to do something further the nursing will let me know or Dr. Basilio Cairo and we can try to coordinate her care as best as possible remotely.

## 2012-08-17 NOTE — Progress Notes (Addendum)
Called in script for Lortab 5/325 mg tabs, 1 po Q 4-6 hrs prn Pain.  Total of 30 tabs, No refills.  Reiterated need for Cheryl Nielsen to take her Carafate QID, take Nexium  BID, and Zantac at least once daily as suggested by Dr. Mitzi Hansen.  Her Daughter states she has lost 6 lbs since 08/09/12 but is drinking fluids and trying to eat soft foods.    Will check her status on Monday.

## 2012-08-18 NOTE — Addendum Note (Signed)
Encounter addended by: Krystyne Tewksbury Mintz [place] Graefe, RN on: 08/18/2012  4:56 PM<BR>     Documentation filed: Charges VN

## 2012-08-18 NOTE — Addendum Note (Signed)
Encounter addended by: Atharv Barriere Mintz Wm Sahagun, RN on: 08/18/2012  4:44 PM<BR>     Documentation filed: Charges VN

## 2012-08-21 ENCOUNTER — Other Ambulatory Visit (HOSPITAL_BASED_OUTPATIENT_CLINIC_OR_DEPARTMENT_OTHER): Payer: Medicare Other

## 2012-08-21 DIAGNOSIS — C3400 Malignant neoplasm of unspecified main bronchus: Secondary | ICD-10-CM

## 2012-08-21 DIAGNOSIS — C3490 Malignant neoplasm of unspecified part of unspecified bronchus or lung: Secondary | ICD-10-CM

## 2012-08-21 LAB — CBC WITH DIFFERENTIAL/PLATELET
Basophils Absolute: 0 10*3/uL (ref 0.0–0.1)
Eosinophils Absolute: 0 10*3/uL (ref 0.0–0.5)
HCT: 37.2 % (ref 34.8–46.6)
HGB: 12.3 g/dL (ref 11.6–15.9)
LYMPH%: 11.2 % — ABNORMAL LOW (ref 14.0–49.7)
MCV: 79.7 fL (ref 79.5–101.0)
MONO#: 0.9 10*3/uL (ref 0.1–0.9)
MONO%: 11 % (ref 0.0–14.0)
NEUT#: 6.2 10*3/uL (ref 1.5–6.5)
Platelets: 157 10*3/uL (ref 145–400)

## 2012-08-21 LAB — COMPREHENSIVE METABOLIC PANEL (CC13)
Albumin: 3.4 g/dL — ABNORMAL LOW (ref 3.5–5.0)
Alkaline Phosphatase: 123 U/L (ref 40–150)
BUN: 13.2 mg/dL (ref 7.0–26.0)
CO2: 29 mEq/L (ref 22–29)
Glucose: 104 mg/dl (ref 70–140)
Total Bilirubin: <0.2 mg/dL (ref 0.20–1.20)

## 2012-08-21 NOTE — Progress Notes (Signed)
Pt's daughter Karoline Caldwell called wanting to make sure Dr Donnald Garre was aware of pt's esophagitis issues.  Emphasized instructions radiation oncology had given to her and she is to call if she worsens or becomes dehydrated.  SLJ

## 2012-08-23 ENCOUNTER — Encounter: Payer: Self-pay | Admitting: Radiation Oncology

## 2012-08-23 ENCOUNTER — Other Ambulatory Visit: Payer: Self-pay

## 2012-08-23 ENCOUNTER — Ambulatory Visit
Admission: RE | Admit: 2012-08-23 | Discharge: 2012-08-23 | Disposition: A | Discharge status: Home / Self Care | Payer: Medicare Other | Source: Ambulatory Visit | Attending: Radiation Oncology | Admitting: Radiation Oncology

## 2012-08-23 ENCOUNTER — Encounter: Payer: Self-pay | Admitting: Internal Medicine

## 2012-08-23 DIAGNOSIS — C7949 Secondary malignant neoplasm of other parts of nervous system: Secondary | ICD-10-CM

## 2012-08-23 DIAGNOSIS — L298 Other pruritus: Secondary | ICD-10-CM | POA: Insufficient documentation

## 2012-08-23 DIAGNOSIS — L2989 Other pruritus: Secondary | ICD-10-CM | POA: Insufficient documentation

## 2012-08-23 DIAGNOSIS — R05 Cough: Secondary | ICD-10-CM | POA: Insufficient documentation

## 2012-08-23 DIAGNOSIS — K209 Esophagitis, unspecified without bleeding: Secondary | ICD-10-CM | POA: Insufficient documentation

## 2012-08-23 DIAGNOSIS — Z510 Encounter for antineoplastic radiation therapy: Secondary | ICD-10-CM | POA: Insufficient documentation

## 2012-08-23 DIAGNOSIS — C7931 Secondary malignant neoplasm of brain: Secondary | ICD-10-CM | POA: Insufficient documentation

## 2012-08-23 DIAGNOSIS — R059 Cough, unspecified: Secondary | ICD-10-CM | POA: Insufficient documentation

## 2012-08-23 DIAGNOSIS — R5381 Other malaise: Secondary | ICD-10-CM | POA: Insufficient documentation

## 2012-08-23 DIAGNOSIS — R0789 Other chest pain: Secondary | ICD-10-CM | POA: Insufficient documentation

## 2012-08-23 NOTE — Progress Notes (Signed)
Radiation Oncology         ([phone]) [phone] ________________________________  Name: Cheryl Nielsen MRN: [id number]  Date: 08/23/2012  DOB: 25-Jul-1940  Follow-Up Visit Note  Outpatient  CC: No PCP Per Patient  Si Gaul, MD  Diagnosis and Prior Radiotherapy:   Stage IV Small Cell Lung Cancer  Indication for treatment: palliative  Radiation treatment dates: 07/24/2012-08/10/2012  Site/dose: Mediastinum and Left Hilum / 35 Gy in 14 fractions  Narrative:  The patient returns today for routine follow-up.  After completing RT, she developed significant esophagitis. She is taking Vicodin Carafate and Magic mouthwash for this. She is also on Nexium and  Zantac.  Due to pain she is not eating or drinking very much. Her kidney function looked good according to her labs earlier this week. Chemotherapy will resume on August 26. She denies any new neurologic issues. As discussed in previous notes, we have held off on whole brain radiation to allow systemic therapy to be given initially.   ALLERGIES:  is allergic to asa and codeine.  Meds: Current Outpatient Prescriptions  Medication Sig Dispense Refill  . acetaminophen (TYLENOL) 500 MG tablet Take 1,000 mg by mouth every 6 (six) hours as needed (For headache.).      Marland Kitchen Alum & Mag Hydroxide-Simeth (MAGIC MOUTHWASH W/LIDOCAINE) SOLN 1 part benadryl, 1 part nystatin, 1 part cherry extra strength Maalox Plus, 3 parts 2% viscous lidocaine. Swallow 10mL up to QID, 30 min before meals, for soreness in esophagus.  480 mL  2  . esomeprazole (NEXIUM) 10 MG packet Take 10 mg by mouth daily before breakfast.      . glucosamine-chondroitin 500-400 MG tablet Take 1 tablet by mouth daily with lunch.       Marland Kitchen HYDROcodone-acetaminophen (NORCO/VICODIN) 5-325 MG per tablet Take 1 tablet by mouth every 6 (six) hours as needed for pain. Q 4-6 hrs prn pain      . lidocaine-prilocaine (EMLA) cream Apply 1 application topically daily as needed (Applies to port-a-cath.).        Marland Kitchen Multiple Vitamin (MULTIVITAMIN WITH MINERALS) TABS Take 1 tablet by mouth daily with lunch. She takes Surveyor, quantity Adult 50+.      . prochlorperazine (COMPAZINE) 10 MG tablet TAKE 1 TABLET BY MOUTH EVERY 6 HOURS AS NEEDED  60 tablet  0  . ranitidine (ZANTAC) 150 MG tablet Take 150 mg by mouth daily as needed for heartburn.       . sucralfate (CARAFATE) 1 GM/10ML suspension Take 1 g by mouth 4 (four) times daily -  with meals and at bedtime.      . ondansetron (ZOFRAN) 4 MG tablet Take 4 mg by mouth every 4 (four) hours as needed for nausea.       No current facility-administered medications for this encounter.    Physical Findings: The patient is in no acute distress. Patient is alert and oriented.  weight is 109 lb 11.2 oz (49.76 kg). Her temperature is 98.1 F (36.7 C). Her blood pressure is 123/81 and her pulse is 102. .  No significant changes. No oral thrush. Grossly, no neurologic deficits.  Lab Findings: Lab Results  Component Value Date   WBC 7.9 08/21/2012   HGB 12.3 08/21/2012   HCT 37.2 08/21/2012   MCV 79.7 08/21/2012   PLT 157 08/21/2012    Radiographic Findings: We reviewed her MRI to results and images from June  Impression/Plan:   I had a lengthy discussion with the patient and her daughter after reviewing  her MRI images/results with them.  We spoke about the risks benefits and side effects of whole brain radiotherapy to address her multiple brain metastases.  During part of our discussion, we spoke about the fatigue, HA, nausea, and hair loss that can occur acutely as well as late cognitive side effects that can result from whole brain radiotherapy - this is usually notable in terms of short term memory and speed of thinking, but rarely a frank dementia.  The patient would like to to proceed.  Consent form was signed today.  Simulation to occur today. I anticipate 32.5 Gy in 13 fractions - if we start this regimen tomorrow, she'll complete the regimen the day before  chemotherapy resumes.  Pt and or daughter will let me know if she shows signs of dehydration - we talked about IV fluids PRN. I expect her esophagitis will improve in the next week. Advised to push PO fluids/soft foods. She is hesitant to take pain meds.  I spent 25 minutes face to face with the patient and more than 50% of that time was spent in counseling and/or coordination of care. _____________________________________   Lonie Peak, MD

## 2012-08-23 NOTE — Progress Notes (Signed)
Cheryl Nielsen here today for fu assessment following radiation therapy to her chest.  She continues to have mid sternal pain when she eats and is only able to tolerate chicken broth and reports that even water burns in the mid chest when she swallows. She has lost 5 lbs since 08/08/12.    She continues on Carafate, magic Mouthwash, Nexium and Zantac.

## 2012-08-23 NOTE — Progress Notes (Signed)
  Radiation Oncology         [phone]) [phone] ________________________________  Name: Cheryl Nielsen MRN: [id number]  Date: 08/23/2012  DOB: 02-22-1940  SIMULATION AND TREATMENT PLANNING NOTE  outpatient  DIAGNOSIS:  Brain metastases  NARRATIVE:  The patient was brought to the CT Simulation planning suite.  Identity was confirmed.  All relevant records and images related to the planned course of therapy were reviewed.  The patient freely provided informed written consent to proceed with treatment after reviewing the details related to the planned course of therapy. The consent form was witnessed and verified by the simulation staff.    Then, the patient was set-up in a stable reproducible  supine position for radiation therapy.  CT images were obtained.  Surface markings were placed.  The CT images were loaded into the planning software.    TREATMENT PLANNING NOTE: Treatment planning then occurred.  The radiation prescription was entered and confirmed.    A total of 3 medically necessary complex treatment devices were fabricated and supervised by me - aquaplast and 2 fields with MLCs to block against eyes/lenses -   I have requested : Isodose Plan/dose calcs  I plan to treat the patient's whole brain to 32.5 Gy in 13 fractions. The patient's brain will be treated with opposed oblique fields using MLCs for custom blocks. Wedges will be used as needed for dose homogeneity.  -----------------------------------  Lonie Peak, MD

## 2012-08-24 ENCOUNTER — Encounter: Payer: Self-pay | Admitting: Radiation Oncology

## 2012-08-24 ENCOUNTER — Ambulatory Visit
Admission: RE | Admit: 2012-08-24 | Discharge: 2012-08-24 | Disposition: A | Discharge status: Home / Self Care | Payer: Medicare Other | Source: Ambulatory Visit | Attending: Radiation Oncology | Admitting: Radiation Oncology

## 2012-08-25 ENCOUNTER — Ambulatory Visit
Admission: RE | Admit: 2012-08-25 | Discharge: 2012-08-25 | Disposition: A | Discharge status: Home / Self Care | Payer: Medicare Other | Source: Ambulatory Visit | Attending: Radiation Oncology | Admitting: Radiation Oncology

## 2012-08-28 ENCOUNTER — Encounter: Payer: Self-pay | Admitting: *Deleted

## 2012-08-28 ENCOUNTER — Other Ambulatory Visit (HOSPITAL_BASED_OUTPATIENT_CLINIC_OR_DEPARTMENT_OTHER): Payer: Medicare Other | Admitting: Lab

## 2012-08-28 ENCOUNTER — Encounter: Payer: Self-pay | Admitting: Radiation Oncology

## 2012-08-28 ENCOUNTER — Other Ambulatory Visit: Payer: Medicare Other | Admitting: Lab

## 2012-08-28 ENCOUNTER — Ambulatory Visit (HOSPITAL_COMMUNITY)
Admission: RE | Admit: 2012-08-28 | Discharge: 2012-08-28 | Disposition: A | Discharge status: Home / Self Care | Payer: Medicare Other | Source: Ambulatory Visit | Attending: Internal Medicine | Admitting: Internal Medicine

## 2012-08-28 ENCOUNTER — Ambulatory Visit
Admission: RE | Admit: 2012-08-28 | Discharge: 2012-08-28 | Disposition: A | Discharge status: Home / Self Care | Payer: Medicare Other | Source: Ambulatory Visit | Attending: Radiation Oncology | Admitting: Radiation Oncology

## 2012-08-28 VITALS — BP 143/74 | HR 75 | Temp 97.4°F | Ht 62.0 in | Wt 110.7 lb

## 2012-08-28 DIAGNOSIS — C3400 Malignant neoplasm of unspecified main bronchus: Secondary | ICD-10-CM

## 2012-08-28 DIAGNOSIS — C7949 Secondary malignant neoplasm of other parts of nervous system: Secondary | ICD-10-CM

## 2012-08-28 DIAGNOSIS — R599 Enlarged lymph nodes, unspecified: Secondary | ICD-10-CM | POA: Insufficient documentation

## 2012-08-28 DIAGNOSIS — J90 Pleural effusion, not elsewhere classified: Secondary | ICD-10-CM | POA: Insufficient documentation

## 2012-08-28 DIAGNOSIS — C3490 Malignant neoplasm of unspecified part of unspecified bronchus or lung: Secondary | ICD-10-CM | POA: Insufficient documentation

## 2012-08-28 DIAGNOSIS — C7970 Secondary malignant neoplasm of unspecified adrenal gland: Secondary | ICD-10-CM | POA: Insufficient documentation

## 2012-08-28 DIAGNOSIS — C7931 Secondary malignant neoplasm of brain: Secondary | ICD-10-CM | POA: Insufficient documentation

## 2012-08-28 DIAGNOSIS — C3492 Malignant neoplasm of unspecified part of left bronchus or lung: Secondary | ICD-10-CM

## 2012-08-28 DIAGNOSIS — C7951 Secondary malignant neoplasm of bone: Secondary | ICD-10-CM | POA: Insufficient documentation

## 2012-08-28 DIAGNOSIS — C787 Secondary malignant neoplasm of liver and intrahepatic bile duct: Secondary | ICD-10-CM | POA: Insufficient documentation

## 2012-08-28 LAB — CBC WITH DIFFERENTIAL/PLATELET
Basophils Absolute: 0 10*3/uL (ref 0.0–0.1)
Eosinophils Absolute: 0 10*3/uL (ref 0.0–0.5)
HGB: 12.2 g/dL (ref 11.6–15.9)
MCV: 80.2 fL (ref 79.5–101.0)
MONO#: 0.9 10*3/uL (ref 0.1–0.9)
MONO%: 9.4 % (ref 0.0–14.0)
NEUT#: 7.6 10*3/uL — ABNORMAL HIGH (ref 1.5–6.5)
Platelets: 305 10*3/uL (ref 145–400)
RBC: 4.59 10*6/uL (ref 3.70–5.45)
RDW: 17.7 % — ABNORMAL HIGH (ref 11.2–14.5)
WBC: 9.2 10*3/uL (ref 3.9–10.3)

## 2012-08-28 LAB — COMPREHENSIVE METABOLIC PANEL (CC13)
Albumin: 3.2 g/dL — ABNORMAL LOW (ref 3.5–5.0)
Alkaline Phosphatase: 106 U/L (ref 40–150)
BUN: 10.1 mg/dL (ref 7.0–26.0)
CO2: 29 mEq/L (ref 22–29)
Calcium: 9.1 mg/dL (ref 8.4–10.4)
Glucose: 85 mg/dl (ref 70–140)
Potassium: 3.6 mEq/L (ref 3.5–5.1)
Sodium: 139 mEq/L (ref 136–145)
Total Protein: 6.6 g/dL (ref 6.4–8.3)

## 2012-08-28 MED ORDER — RADIAPLEXRX EX GEL
Freq: Once | CUTANEOUS | Status: AC
  Administered 2012-08-28: 12:00:00 via TOPICAL

## 2012-08-28 MED ORDER — IOHEXOL 300 MG/ML  SOLN
100.0000 mL | Freq: Once | INTRAMUSCULAR | Status: AC | PRN
  Administered 2012-08-28: 100 mL via INTRAVENOUS

## 2012-08-28 NOTE — Progress Notes (Signed)
Cruz Condon here with her daughter for weekly under treat visit.  She has had 3 fractions to her whole brain.  She denies pain.  She does have some soreness in her throat with swallowing.  She denies fatigue, dizziness, nausea and vision changes. We discussed the potential side effects of radiation including dizziness, changes in vision and skin changes.  She is requesting another tube of radiaplex gel.  Another tube has been given.  She denies shortness of breath.  She has an occasional cough.

## 2012-08-28 NOTE — Progress Notes (Signed)
Simulation Verification Note Outpatient Brain 08/24/12  The patient was brought to the treatment unit and placed in the planned treatment position. The clinical setup was verified. Then port films were obtained and uploaded to the radiation oncology medical record software.  The treatment beams were carefully compared against the planned radiation fields. The position location and shape of the radiation fields was reviewed. The targeted volume of tissue appears to be appropriately covered by the radiation beams. Organs at risk appear to be excluded as planned.  Based on my personal review, I approved the simulation verification. The patient's treatment will proceed as planned.  -----------------------------------  Lonie Peak, MD

## 2012-08-28 NOTE — Progress Notes (Signed)
   Weekly Management Note:  outpatient Current Dose:  7.5 Gy  Projected Dose: 32.5 Gy  (brain)  Narrative:  The patient presents for routine under treatment assessment.  CBCT/MVCT images/Port film x-rays were reviewed.  The chart was checked. She is doing relatively well.  Esophagitis is improving. No new fatigue.  Physical Findings:  height is 5\' 2"  (1.575 m) and weight is 110 lb 11.2 oz (50.213 kg). Her temperature is 97.4 F (36.3 C). Her blood pressure is 143/74 and her pulse is 75. Her oxygen saturation is 98%.  NAD  Impression:  The patient is tolerating radiotherapy.  Plan:  Continue radiotherapy as planned.   ________________________________   Lonie Peak, M.D.

## 2012-08-29 ENCOUNTER — Ambulatory Visit
Admission: RE | Admit: 2012-08-29 | Discharge: 2012-08-29 | Disposition: A | Discharge status: Home / Self Care | Payer: Medicare Other | Source: Ambulatory Visit | Attending: Radiation Oncology | Admitting: Radiation Oncology

## 2012-08-29 ENCOUNTER — Ambulatory Visit: Payer: Medicare Other

## 2012-08-29 ENCOUNTER — Encounter: Payer: Self-pay | Admitting: Internal Medicine

## 2012-08-29 ENCOUNTER — Other Ambulatory Visit: Payer: Medicare Other | Admitting: Lab

## 2012-08-29 ENCOUNTER — Ambulatory Visit (HOSPITAL_BASED_OUTPATIENT_CLINIC_OR_DEPARTMENT_OTHER): Payer: Medicare Other | Admitting: Internal Medicine

## 2012-08-29 VITALS — BP 122/80 | HR 94 | Temp 98.4°F | Resp 19 | Ht 62.0 in | Wt 109.1 lb

## 2012-08-29 DIAGNOSIS — C7951 Secondary malignant neoplasm of bone: Secondary | ICD-10-CM

## 2012-08-29 DIAGNOSIS — J90 Pleural effusion, not elsewhere classified: Secondary | ICD-10-CM

## 2012-08-29 DIAGNOSIS — C3490 Malignant neoplasm of unspecified part of unspecified bronchus or lung: Secondary | ICD-10-CM

## 2012-08-29 DIAGNOSIS — C787 Secondary malignant neoplasm of liver and intrahepatic bile duct: Secondary | ICD-10-CM

## 2012-08-29 DIAGNOSIS — C3492 Malignant neoplasm of unspecified part of left bronchus or lung: Secondary | ICD-10-CM

## 2012-08-29 DIAGNOSIS — R222 Localized swelling, mass and lump, trunk: Secondary | ICD-10-CM

## 2012-08-29 NOTE — Progress Notes (Signed)
[facility] Telephone:(336) [phone]   Fax:(336) [phone]  OFFICE PROGRESS NOTE  No PCP Per Patient [address] [place] [address]  DIAGNOSIS AND STAGE: Extensive stage small cell lung cancer with large obstructing Center left lung mass with large left pleural effusion, liver and brain metastasis diagnosed in June of 2014   PRIOR THERAPY: None   CURRENT THERAPY:  1) Systemic chemotherapy with carboplatin for AUC of 5 on day 1 and etoposide 120 mg/M2 on days 1, 2 and 3 with Neulasta support on day 4, status post 2 cycles. First cycle started 07/18/2012. 2) whole brain irradiation under the care of Dr. Basilio Cairo expected to be completed on 09/11/2012.  CHEMOTHERAPY INTENT: Palliative  CURRENT # OF CHEMOTHERAPY CYCLES: 2  CURRENT ANTIEMETICS: Zofran, dexamethasone and Compazine  CURRENT SMOKING STATUS: Former smoker  ORAL CHEMOTHERAPY AND CONSENT: None  CURRENT BISPHOSPHONATES USE: None  PAIN MANAGEMENT: 0/10 on Vicodin  NARCOTICS INDUCED CONSTIPATION: None  LIVING WILL AND CODE STATUS: no CODE BLUE  INTERVAL HISTORY: Cheryl Nielsen 72 y.o. female returns to the clinic today for follow up visit accompanied by her daughter. The patient is feeling fine today with no specific complaints. She denied having any significant nausea or vomiting. She denied having any chest pain, shortness breath, cough or hemoptysis. She has no fever or chills. She has no significant weight loss or night sweats. The patient tolerated the second cycle of her chemotherapy fairly well. She started palliative whole brain irradiation under the care of Dr. Basilio Cairo last week and expected to complete this course of treatment on 09/11/2012. The patient had repeat CT scan of the chest, abdomen and pelvis performed recently and she is here for evaluation and discussion of her scan results.  MEDICAL HISTORY: Past Medical History  Diagnosis Date  . Pneumonia, organism unspecified   . Lung  cancer 07/05/12    Left Mainstem Bronchus- Small Cell Carcinoma    ALLERGIES:  is allergic to asa and codeine.  MEDICATIONS:  Current Outpatient Prescriptions  Medication Sig Dispense Refill  . Alum & Mag Hydroxide-Simeth (MAGIC MOUTHWASH W/LIDOCAINE) SOLN 1 part benadryl, 1 part nystatin, 1 part cherry extra strength Maalox Plus, 3 parts 2% viscous lidocaine. Swallow 10mL up to QID, 30 min before meals, for soreness in esophagus.  480 mL  2  . esomeprazole (NEXIUM) 10 MG packet Take 10 mg by mouth daily before breakfast.      . glucosamine-chondroitin 500-400 MG tablet Take 1 tablet by mouth daily with lunch.       . lidocaine-prilocaine (EMLA) cream Apply 1 application topically daily as needed (Applies to port-a-cath.).      Marland Kitchen Multiple Vitamin (MULTIVITAMIN WITH MINERALS) TABS Take 1 tablet by mouth daily with lunch. She takes Surveyor, quantity Adult 50+.      . prochlorperazine (COMPAZINE) 10 MG tablet TAKE 1 TABLET BY MOUTH EVERY 6 HOURS AS NEEDED  60 tablet  0  . ranitidine (ZANTAC) 150 MG tablet Take 150 mg by mouth daily as needed for heartburn.       . sucralfate (CARAFATE) 1 GM/10ML suspension Take 1 g by mouth 4 (four) times daily -  with meals and at bedtime.      Marland Kitchen acetaminophen (TYLENOL) 500 MG tablet Take 1,000 mg by mouth every 6 (six) hours as needed (For headache.).      . HYDROcodone-acetaminophen (NORCO/VICODIN) 5-325 MG per tablet Take 1 tablet by mouth every 6 (six) hours as needed  for pain. Q 4-6 hrs prn pain      . ondansetron (ZOFRAN) 4 MG tablet Take 4 mg by mouth every 4 (four) hours as needed for nausea.       No current facility-administered medications for this visit.    SURGICAL HISTORY:  Past Surgical History  Procedure Laterality Date  . Nasal sinus surgery    . Video bronchoscopy Bilateral 07/05/2012    Procedure: VIDEO BRONCHOSCOPY WITHOUT FLUORO;  Surgeon: Nyoka Cowden, MD;  Location: [facility];  Service: Cardiopulmonary;  Laterality: Bilateral;     REVIEW OF SYSTEMS:  A comprehensive review of systems was negative except for: Constitutional: positive for fatigue   PHYSICAL EXAMINATION: General appearance: alert, cooperative, fatigued and no distress Head: Normocephalic, without obvious abnormality, atraumatic Neck: no adenopathy Lymph nodes: Cervical, supraclavicular, and axillary nodes normal. Resp: clear to auscultation bilaterally Cardio: regular rate and rhythm, S1, S2 normal, no murmur, click, rub or gallop GI: soft, non-tender; bowel sounds normal; no masses,  no organomegaly Extremities: extremities normal, atraumatic, no cyanosis or edema Neurologic: Alert and oriented X 3, normal strength and tone. Normal symmetric reflexes. Normal coordination and gait  ECOG PERFORMANCE STATUS: 1 - Symptomatic but completely ambulatory  Blood pressure 122/80, pulse 94, temperature 98.4 F (36.9 C), temperature source Oral, resp. rate 19, height 5\' 2"  (1.575 m), weight 109 lb 1.6 oz (49.487 kg).  LABORATORY DATA: Lab Results  Component Value Date   WBC 9.2 08/28/2012   HGB 12.2 08/28/2012   HCT 36.8 08/28/2012   MCV 80.2 08/28/2012   PLT 305 08/28/2012      Chemistry      Component Value Date/Time   NA 139 08/28/2012 1134   NA 133* 06/27/2012 1706   K 3.6 08/28/2012 1134   K 4.5 06/27/2012 1706   CL 93* 07/10/2012 1544   CL 95* 06/27/2012 1706   CO2 29 08/28/2012 1134   CO2 25 06/27/2012 1706   BUN 10.1 08/28/2012 1134   BUN 14 06/27/2012 1706   CREATININE 0.9 08/28/2012 1134   CREATININE 1.0 06/27/2012 1706      Component Value Date/Time   CALCIUM 9.1 08/28/2012 1134   CALCIUM 9.1 06/27/2012 1706   ALKPHOS 106 08/28/2012 1134   AST 16 08/28/2012 1134   ALT 14 08/28/2012 1134   BILITOT <0.20 08/28/2012 1134       RADIOGRAPHIC STUDIES: Ct Chest W Contrast  08/28/2012   *RADIOLOGY REPORT*  Clinical Data:  Lung cancer.  Brain metastasis.  Radiation therapy ongoing.  CT CHEST, ABDOMEN AND PELVIS WITH CONTRAST  Technique:   Multidetector CT imaging of the chest, abdomen and pelvis was performed following the standard protocol during bolus administration of intravenous contrast.  Contrast: OMNIPAQUE IOHEXOL 300 MG/ML  SOLN  Comparison:  CT 06/27 1014   CT CHEST  Findings:  A port in the right anterior chest wall.  No supraclavicular lymphadenopathy.  There is some reduction in the bulky mediastinal lymphadenopathy.  The prevascular adenopathy measures 5 mm short axis thickness compared to 18 mm on prior. Subcarinal node measures 7 mm short axis compared to 23 mm on prior.  There is interval expansion of the left upper lobe and left lower lobe.  There is a persistent left hilar mass measuring approximately 5.0 x 2.9 cm.  Small left effusion remains.  Within the right lung there are no new or suspicious pulmonary nodules.  IMPRESSION:  1.  Interval expansion of the left upper lobe and left lower  lobe. 2.  Persistent left hilar mass.  3.  Significant reduction in  mediastinal lymphadenopathy.    CT ABDOMEN AND PELVIS  Findings:  Hepatic lesions are decreased in size.  No new hepatic lesions are present.  Exemplary lesion in the inferior medial right hepatic lobe measures 8 mm compared to 33 mm on prior.  Adrenal lesions are also decreased.  The left gland measures 20 mm x 14 mm decreased from 29 x 19 mm on prior.  Small bowel, and colon unremarkable.  Abdominal aorta normal caliber.  No retroperitoneal periportal lymphadenopathy.  No mesenteric disease.  The bladder is normal.  Uterus is normal.  No pelvic lymphadenopathy.  Sclerotic lesions in the spine are more conspicuous.  Exemplary lesions are sclerotic lesion within the L4 vertebral body and the T8 vertebral body (sagittal image 46 and 49) respectively which are more conspicuous than comparison exam.  IMPRESSION: 1.  Reduction in size of hepatic metastasis. 2.  Mild reduction in size of adrenal metastasis. The 3.  Skeletal metastasis are more conspicuous than comparison  exam. This could indicate a healing response versus disease progression.   Original Report Authenticated By: Genevive Bi, M.D.   ASSESSMENT AND PLAN: this is a very pleasant 72 years old white female with extensive stage small cell lung cancer with multiple brain metastasis and large left lung mass as well as left pleural effusion, and liver metastasis. She is status post 2 cycle of systemic chemotherapy with carboplatin and etoposide with significant improvement in her disease in the lung and liver. I discussed the scan results and showed the images to the patient and her daughter. She would be on a break of chemotherapy until completion of her whole brain irradiation. I expect the patient to start the third cycle of her treatment on 09/12/2012. She would come back for follow up visit at that time. She was advised to call immediately if she has any concerning symptoms in the interval.  The patient voices understanding of current disease status and treatment options and is in agreement with the current care plan.  All questions were answered. The patient knows to call the clinic with any problems, questions or concerns. We can certainly see the patient much sooner if necessary.  I spent 15 minutes counseling the patient face to face. The total time spent in the appointment was 25 minutes.

## 2012-08-29 NOTE — Patient Instructions (Signed)
CHEMOTHERAPY INTENT: Palliative  CURRENT # OF CHEMOTHERAPY CYCLES: 2  CURRENT ANTIEMETICS: Zofran, dexamethasone and Compazine  CURRENT SMOKING STATUS: Former smoker  ORAL CHEMOTHERAPY AND CONSENT: None  CURRENT BISPHOSPHONATES USE: None  PAIN MANAGEMENT: 0/10 on Vicodin  NARCOTICS INDUCED CONSTIPATION: None  LIVING WILL AND CODE STATUS: no CODE BLUE

## 2012-08-30 ENCOUNTER — Telehealth: Payer: Self-pay | Admitting: *Deleted

## 2012-08-30 ENCOUNTER — Telehealth: Payer: Self-pay | Admitting: Internal Medicine

## 2012-08-30 ENCOUNTER — Ambulatory Visit: Payer: Medicare Other

## 2012-08-30 ENCOUNTER — Ambulatory Visit
Admission: RE | Admit: 2012-08-30 | Discharge: 2012-08-30 | Disposition: A | Discharge status: Home / Self Care | Payer: Medicare Other | Source: Ambulatory Visit | Attending: Radiation Oncology | Admitting: Radiation Oncology

## 2012-08-30 NOTE — Telephone Encounter (Signed)
s.w. pt daughter and advise don all updated appts...ok and awre

## 2012-08-30 NOTE — Telephone Encounter (Signed)
Per staff message and POF I have scheduled appts.  JMW  

## 2012-08-31 ENCOUNTER — Ambulatory Visit: Payer: Medicare Other

## 2012-08-31 ENCOUNTER — Ambulatory Visit
Admission: RE | Admit: 2012-08-31 | Discharge: 2012-08-31 | Disposition: A | Discharge status: Home / Self Care | Payer: Medicare Other | Source: Ambulatory Visit | Attending: Radiation Oncology | Admitting: Radiation Oncology

## 2012-09-01 ENCOUNTER — Ambulatory Visit: Payer: Medicare Other

## 2012-09-01 ENCOUNTER — Ambulatory Visit
Admission: RE | Admit: 2012-09-01 | Discharge: 2012-09-01 | Disposition: A | Discharge status: Home / Self Care | Payer: Medicare Other | Source: Ambulatory Visit | Attending: Radiation Oncology | Admitting: Radiation Oncology

## 2012-09-04 ENCOUNTER — Ambulatory Visit
Admission: RE | Admit: 2012-09-04 | Discharge: 2012-09-04 | Disposition: A | Discharge status: Home / Self Care | Payer: Medicare Other | Source: Ambulatory Visit | Attending: Radiation Oncology | Admitting: Radiation Oncology

## 2012-09-04 ENCOUNTER — Encounter: Payer: Self-pay | Admitting: Radiation Oncology

## 2012-09-04 ENCOUNTER — Other Ambulatory Visit (HOSPITAL_BASED_OUTPATIENT_CLINIC_OR_DEPARTMENT_OTHER): Payer: Medicare Other

## 2012-09-04 VITALS — BP 150/87 | HR 67 | Temp 97.7°F | Resp 20 | Wt 110.5 lb

## 2012-09-04 DIAGNOSIS — C3400 Malignant neoplasm of unspecified main bronchus: Secondary | ICD-10-CM

## 2012-09-04 DIAGNOSIS — C7931 Secondary malignant neoplasm of brain: Secondary | ICD-10-CM

## 2012-09-04 DIAGNOSIS — C3492 Malignant neoplasm of unspecified part of left bronchus or lung: Secondary | ICD-10-CM

## 2012-09-04 LAB — COMPREHENSIVE METABOLIC PANEL (CC13)
Albumin: 3.5 g/dL (ref 3.5–5.0)
Alkaline Phosphatase: 106 U/L (ref 40–150)
BUN: 11.3 mg/dL (ref 7.0–26.0)
Calcium: 9.6 mg/dL (ref 8.4–10.4)
Chloride: 101 mEq/L (ref 98–109)
Glucose: 88 mg/dl (ref 70–140)
Potassium: 5.1 mEq/L (ref 3.5–5.1)
Sodium: 139 mEq/L (ref 136–145)
Total Protein: 6.9 g/dL (ref 6.4–8.3)

## 2012-09-04 LAB — CBC WITH DIFFERENTIAL/PLATELET
Basophils Absolute: 0.1 10*3/uL (ref 0.0–0.1)
Eosinophils Absolute: 0 10*3/uL (ref 0.0–0.5)
HGB: 12.9 g/dL (ref 11.6–15.9)
MONO#: 0.9 10*3/uL (ref 0.1–0.9)
MONO%: 12.7 % (ref 0.0–14.0)
NEUT#: 5.1 10*3/uL (ref 1.5–6.5)
RBC: 4.85 10*6/uL (ref 3.70–5.45)
RDW: 19.1 % — ABNORMAL HIGH (ref 11.2–14.5)
WBC: 6.7 10*3/uL (ref 3.9–10.3)
lymph#: 0.7 10*3/uL — ABNORMAL LOW (ref 0.9–3.3)

## 2012-09-04 NOTE — Progress Notes (Addendum)
   Weekly Management Note:  outpatient Current Dose:  20 Gy  Projected Dose: 32.5 Gy   Narrative:  The patient presents for routine under treatment assessment.  CBCT/MVCT images/Port film x-rays were reviewed.  The chart was checked. CT scan last week showed dramatic response in chest to ChRT.  Left lung reinflated.  She reports an itchy scalp and resolving esophagitis.  Physical Findings:  weight is 110 lb 8 oz (50.122 kg). Her oral temperature is 97.7 F (36.5 C). Her blood pressure is 150/87 and her pulse is 67. Her respiration is 20 and oxygen saturation is 99%.  NAD, no thrush  Impression:  The patient is tolerating radiotherapy.  Plan:  Continue radiotherapy as planned. Apply radiation cream to scalp to soothe.  ________________________________   Lonie Peak, M.D.

## 2012-09-04 NOTE — Progress Notes (Addendum)
Weekly rad tx whole brain 8/13 completed, c/o scalp itching, not using radiaplex cream as yet will start today, fatigued, no nausea, no blurred vision, still has  Esophageal discomfort, no coughing, weight same,   Is taking caraftea and mmw still, Chemotherapy carboplatin and etoposide regimen, with neulasta 8:29 AM\

## 2012-09-05 ENCOUNTER — Ambulatory Visit
Admission: RE | Admit: 2012-09-05 | Discharge: 2012-09-05 | Disposition: A | Discharge status: Home / Self Care | Payer: Medicare Other | Source: Ambulatory Visit | Attending: Radiation Oncology | Admitting: Radiation Oncology

## 2012-09-06 ENCOUNTER — Ambulatory Visit
Admission: RE | Admit: 2012-09-06 | Discharge: 2012-09-06 | Disposition: A | Discharge status: Home / Self Care | Payer: Medicare Other | Source: Ambulatory Visit | Attending: Radiation Oncology | Admitting: Radiation Oncology

## 2012-09-07 ENCOUNTER — Ambulatory Visit
Admission: RE | Admit: 2012-09-07 | Discharge: 2012-09-07 | Disposition: A | Discharge status: Home / Self Care | Payer: Medicare Other | Source: Ambulatory Visit | Attending: Radiation Oncology | Admitting: Radiation Oncology

## 2012-09-08 ENCOUNTER — Ambulatory Visit
Admission: RE | Admit: 2012-09-08 | Discharge: 2012-09-08 | Disposition: A | Discharge status: Home / Self Care | Payer: Medicare Other | Source: Ambulatory Visit | Attending: Radiation Oncology | Admitting: Radiation Oncology

## 2012-09-08 ENCOUNTER — Ambulatory Visit: Payer: Medicare Other | Admitting: Radiation Oncology

## 2012-09-11 ENCOUNTER — Ambulatory Visit
Admission: RE | Admit: 2012-09-11 | Discharge: 2012-09-11 | Disposition: A | Discharge status: Home / Self Care | Payer: Medicare Other | Source: Ambulatory Visit | Attending: Radiation Oncology | Admitting: Radiation Oncology

## 2012-09-11 ENCOUNTER — Other Ambulatory Visit: Payer: Self-pay | Admitting: Radiation Therapy

## 2012-09-11 ENCOUNTER — Other Ambulatory Visit: Payer: Medicare Other | Admitting: Lab

## 2012-09-11 ENCOUNTER — Encounter: Payer: Self-pay | Admitting: Radiation Oncology

## 2012-09-11 VITALS — BP 152/83 | HR 71 | Temp 97.5°F | Resp 20 | Wt 110.9 lb

## 2012-09-11 DIAGNOSIS — C7931 Secondary malignant neoplasm of brain: Secondary | ICD-10-CM

## 2012-09-11 NOTE — Progress Notes (Signed)
Pt denies pain, does state she has "off and on dull pain in her chest, all over". She states it occasionally lasts 2-3 hours. For last episode a few days ago she took Tylenol w/100% relief. Pt is fatigued w/loss of appetite. She states her swallowing is much improved w/slight "burning" in her throat when she eats. She continues to take Carafate. Occasional cough. Pt completed treatment today to whole brain, has 1 month FU card.

## 2012-09-11 NOTE — Progress Notes (Signed)
   Weekly Management Note  outpatient  Completed Radiotherapy. Total Dose: 32.5 Gy  Whole brain  Narrative:  The patient presents for routine under treatment assessment on last day of radiotherapy.  CBCT/MVCT images/Port film x-rays were reviewed.  The chart was checked. Fatigue. Esophagitis much better. Dull pain in chest responds to tylenol. No other associated symptoms with this.  Minimal scalp irritation. Resumes chemo tomorrow.    Physical Findings:  weight is 110 lb 14.4 oz (50.304 kg). Her oral temperature is 97.5 F (36.4 C). Her blood pressure is 152/83 and her pulse is 71. Her respiration is 20.   NAD, well appearing.  Impression:  The patient has tolerated radiotherapy.  Plan:   follow-up in 2 months after MRI of brain.  Encouraged to call if any needs arise in the interim. ________________________________   Lonie Peak, M.D.

## 2012-09-12 ENCOUNTER — Ambulatory Visit (HOSPITAL_BASED_OUTPATIENT_CLINIC_OR_DEPARTMENT_OTHER): Payer: Medicare Other

## 2012-09-12 ENCOUNTER — Other Ambulatory Visit (HOSPITAL_BASED_OUTPATIENT_CLINIC_OR_DEPARTMENT_OTHER): Payer: Medicare Other | Admitting: Lab

## 2012-09-12 ENCOUNTER — Other Ambulatory Visit: Payer: Medicare Other | Admitting: Lab

## 2012-09-12 ENCOUNTER — Ambulatory Visit (HOSPITAL_BASED_OUTPATIENT_CLINIC_OR_DEPARTMENT_OTHER): Payer: Medicare Other | Admitting: Physician Assistant

## 2012-09-12 ENCOUNTER — Encounter: Payer: Self-pay | Admitting: Internal Medicine

## 2012-09-12 VITALS — BP 136/81 | HR 77 | Temp 98.1°F | Resp 18 | Ht 62.0 in | Wt 111.4 lb

## 2012-09-12 DIAGNOSIS — Z5111 Encounter for antineoplastic chemotherapy: Secondary | ICD-10-CM

## 2012-09-12 DIAGNOSIS — C3490 Malignant neoplasm of unspecified part of unspecified bronchus or lung: Secondary | ICD-10-CM

## 2012-09-12 DIAGNOSIS — C787 Secondary malignant neoplasm of liver and intrahepatic bile duct: Secondary | ICD-10-CM

## 2012-09-12 DIAGNOSIS — C7931 Secondary malignant neoplasm of brain: Secondary | ICD-10-CM

## 2012-09-12 LAB — COMPREHENSIVE METABOLIC PANEL (CC13)
ALT: 14 U/L (ref 0–55)
Albumin: 3.5 g/dL (ref 3.5–5.0)
Alkaline Phosphatase: 107 U/L (ref 40–150)
Potassium: 4.3 mEq/L (ref 3.5–5.1)
Sodium: 138 mEq/L (ref 136–145)
Total Bilirubin: 0.29 mg/dL (ref 0.20–1.20)
Total Protein: 6.8 g/dL (ref 6.4–8.3)

## 2012-09-12 LAB — CBC WITH DIFFERENTIAL/PLATELET
EOS%: 1 % (ref 0.0–7.0)
Eosinophils Absolute: 0.1 10*3/uL (ref 0.0–0.5)
HGB: 13.3 g/dL (ref 11.6–15.9)
MCH: 26.9 pg (ref 25.1–34.0)
MCV: 82.2 fL (ref 79.5–101.0)
MONO%: 17.2 % — ABNORMAL HIGH (ref 0.0–14.0)
NEUT#: 4.8 10*3/uL (ref 1.5–6.5)
RBC: 4.94 10*6/uL (ref 3.70–5.45)
RDW: 18.5 % — ABNORMAL HIGH (ref 11.2–14.5)
lymph#: 0.7 10*3/uL — ABNORMAL LOW (ref 0.9–3.3)
nRBC: 0 % (ref 0–0)

## 2012-09-12 MED ORDER — SODIUM CHLORIDE 0.9 % IV SOLN
360.0000 mg | Freq: Once | INTRAVENOUS | Status: AC
  Administered 2012-09-12: 360 mg via INTRAVENOUS
  Filled 2012-09-12: qty 36

## 2012-09-12 MED ORDER — DEXAMETHASONE SODIUM PHOSPHATE 20 MG/5ML IJ SOLN
20.0000 mg | Freq: Once | INTRAMUSCULAR | Status: AC
  Administered 2012-09-12: 20 mg via INTRAVENOUS

## 2012-09-12 MED ORDER — ONDANSETRON 16 MG/50ML IVPB (CHCC)
16.0000 mg | Freq: Once | INTRAVENOUS | Status: AC
  Administered 2012-09-12: 16 mg via INTRAVENOUS

## 2012-09-12 MED ORDER — SODIUM CHLORIDE 0.9 % IV SOLN
120.0000 mg/m2 | Freq: Once | INTRAVENOUS | Status: AC
  Administered 2012-09-12: 180 mg via INTRAVENOUS
  Filled 2012-09-12: qty 9

## 2012-09-12 MED ORDER — HEPARIN SOD (PORK) LOCK FLUSH 100 UNIT/ML IV SOLN
500.0000 [IU] | Freq: Once | INTRAVENOUS | Status: AC | PRN
  Administered 2012-09-12: 500 [IU]
  Filled 2012-09-12: qty 5

## 2012-09-12 MED ORDER — SODIUM CHLORIDE 0.9 % IV SOLN
Freq: Once | INTRAVENOUS | Status: AC
  Administered 2012-09-12: 14:00:00 via INTRAVENOUS

## 2012-09-12 MED ORDER — SODIUM CHLORIDE 0.9 % IJ SOLN
10.0000 mL | INTRAMUSCULAR | Status: DC | PRN
  Administered 2012-09-12: 10 mL
  Filled 2012-09-12: qty 10

## 2012-09-12 NOTE — Patient Instructions (Addendum)
[facility] Discharge Instructions for Patients Receiving Chemotherapy  Today you received the following chemotherapy agents Etoposide/Carboplatin.  To help prevent nausea and vomiting after your treatment, we encourage you to take your nausea medication as prescribed.   If you develop nausea and vomiting that is not controlled by your nausea medication, call the clinic.   BELOW ARE SYMPTOMS THAT SHOULD BE REPORTED IMMEDIATELY:  *FEVER GREATER THAN 100.5 F  *CHILLS WITH OR WITHOUT FEVER  NAUSEA AND VOMITING THAT IS NOT CONTROLLED WITH YOUR NAUSEA MEDICATION  *UNUSUAL SHORTNESS OF BREATH  *UNUSUAL BRUISING OR BLEEDING  TENDERNESS IN MOUTH AND THROAT WITH OR WITHOUT PRESENCE OF ULCERS  *URINARY PROBLEMS  *BOWEL PROBLEMS  UNUSUAL RASH Items with * indicate a potential emergency and should be followed up as soon as possible.  Feel free to call the clinic you have any questions or concerns. The clinic phone number is [phone].    

## 2012-09-13 ENCOUNTER — Telehealth: Payer: Self-pay | Admitting: *Deleted

## 2012-09-13 ENCOUNTER — Telehealth: Payer: Self-pay | Admitting: Internal Medicine

## 2012-09-13 ENCOUNTER — Ambulatory Visit (HOSPITAL_BASED_OUTPATIENT_CLINIC_OR_DEPARTMENT_OTHER): Payer: Medicare Other

## 2012-09-13 VITALS — BP 157/89 | HR 78 | Temp 96.9°F | Resp 20

## 2012-09-13 DIAGNOSIS — C787 Secondary malignant neoplasm of liver and intrahepatic bile duct: Secondary | ICD-10-CM

## 2012-09-13 DIAGNOSIS — C3490 Malignant neoplasm of unspecified part of unspecified bronchus or lung: Secondary | ICD-10-CM

## 2012-09-13 DIAGNOSIS — Z5111 Encounter for antineoplastic chemotherapy: Secondary | ICD-10-CM

## 2012-09-13 DIAGNOSIS — C7951 Secondary malignant neoplasm of bone: Secondary | ICD-10-CM

## 2012-09-13 MED ORDER — ONDANSETRON 8 MG/50ML IVPB (CHCC)
8.0000 mg | Freq: Once | INTRAVENOUS | Status: AC
  Administered 2012-09-13: 8 mg via INTRAVENOUS

## 2012-09-13 MED ORDER — HEPARIN SOD (PORK) LOCK FLUSH 100 UNIT/ML IV SOLN
500.0000 [IU] | Freq: Once | INTRAVENOUS | Status: AC | PRN
  Administered 2012-09-13: 500 [IU]
  Filled 2012-09-13: qty 5

## 2012-09-13 MED ORDER — DEXAMETHASONE SODIUM PHOSPHATE 10 MG/ML IJ SOLN
10.0000 mg | Freq: Once | INTRAMUSCULAR | Status: AC
  Administered 2012-09-13: 10 mg via INTRAVENOUS

## 2012-09-13 MED ORDER — SODIUM CHLORIDE 0.9 % IJ SOLN
10.0000 mL | INTRAMUSCULAR | Status: DC | PRN
  Administered 2012-09-13: 10 mL
  Filled 2012-09-13: qty 10

## 2012-09-13 MED ORDER — SODIUM CHLORIDE 0.9 % IV SOLN
120.0000 mg/m2 | Freq: Once | INTRAVENOUS | Status: AC
  Administered 2012-09-13: 180 mg via INTRAVENOUS
  Filled 2012-09-13: qty 9

## 2012-09-13 MED ORDER — SODIUM CHLORIDE 0.9 % IV SOLN
Freq: Once | INTRAVENOUS | Status: AC
  Administered 2012-09-13: 10:00:00 via INTRAVENOUS

## 2012-09-13 NOTE — Telephone Encounter (Signed)
gv and printed appt sched and avs for pt for Aug and SEpt....pt ok and aware

## 2012-09-13 NOTE — Patient Instructions (Addendum)
[facility] Discharge Instructions for Patients Receiving Chemotherapy  Today you received the following chemotherapy agents: Etoposide   To help prevent nausea and vomiting after your treatment, we encourage you to take your nausea medication as directed by your physician   If you develop nausea and vomiting that is not controlled by your nausea medication, call the clinic.   BELOW ARE SYMPTOMS THAT SHOULD BE REPORTED IMMEDIATELY:  *FEVER GREATER THAN 100.5 F  *CHILLS WITH OR WITHOUT FEVER  NAUSEA AND VOMITING THAT IS NOT CONTROLLED WITH YOUR NAUSEA MEDICATION  *UNUSUAL SHORTNESS OF BREATH  *UNUSUAL BRUISING OR BLEEDING  TENDERNESS IN MOUTH AND THROAT WITH OR WITHOUT PRESENCE OF ULCERS  *URINARY PROBLEMS  *BOWEL PROBLEMS  UNUSUAL RASH Items with * indicate a potential emergency and should be followed up as soon as possible.  Feel free to call the clinic you have any questions or concerns. The clinic phone number is [phone].

## 2012-09-13 NOTE — Progress Notes (Signed)
  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      Plan: The patient has completed radiation treatment. The patient will return to radiation oncology clinic for routine followup in one month. I advised them to call or return sooner if they have any questions or concerns related to their recovery or treatment.  -----------------------------------  Lonie Peak, MD

## 2012-09-13 NOTE — Telephone Encounter (Signed)
Per staff message and POF I have scheduled appts.  JMW  

## 2012-09-14 ENCOUNTER — Ambulatory Visit (HOSPITAL_BASED_OUTPATIENT_CLINIC_OR_DEPARTMENT_OTHER): Payer: Medicare Other

## 2012-09-14 VITALS — BP 146/89 | HR 73 | Temp 98.3°F | Resp 19

## 2012-09-14 DIAGNOSIS — C3490 Malignant neoplasm of unspecified part of unspecified bronchus or lung: Secondary | ICD-10-CM

## 2012-09-14 DIAGNOSIS — C787 Secondary malignant neoplasm of liver and intrahepatic bile duct: Secondary | ICD-10-CM

## 2012-09-14 DIAGNOSIS — Z5111 Encounter for antineoplastic chemotherapy: Secondary | ICD-10-CM

## 2012-09-14 MED ORDER — HEPARIN SOD (PORK) LOCK FLUSH 100 UNIT/ML IV SOLN
500.0000 [IU] | Freq: Once | INTRAVENOUS | Status: AC | PRN
  Administered 2012-09-14: 500 [IU]
  Filled 2012-09-14: qty 5

## 2012-09-14 MED ORDER — SODIUM CHLORIDE 0.9 % IV SOLN
120.0000 mg/m2 | Freq: Once | INTRAVENOUS | Status: AC
  Administered 2012-09-14: 180 mg via INTRAVENOUS
  Filled 2012-09-14: qty 9

## 2012-09-14 MED ORDER — ONDANSETRON 8 MG/50ML IVPB (CHCC)
8.0000 mg | Freq: Once | INTRAVENOUS | Status: AC
  Administered 2012-09-14: 8 mg via INTRAVENOUS

## 2012-09-14 MED ORDER — SODIUM CHLORIDE 0.9 % IJ SOLN
10.0000 mL | INTRAMUSCULAR | Status: DC | PRN
  Administered 2012-09-14: 10 mL
  Filled 2012-09-14: qty 10

## 2012-09-14 MED ORDER — DEXAMETHASONE SODIUM PHOSPHATE 10 MG/ML IJ SOLN
10.0000 mg | Freq: Once | INTRAMUSCULAR | Status: AC
  Administered 2012-09-14: 10 mg via INTRAVENOUS

## 2012-09-14 MED ORDER — SODIUM CHLORIDE 0.9 % IV SOLN
Freq: Once | INTRAVENOUS | Status: AC
  Administered 2012-09-14: 09:00:00 via INTRAVENOUS

## 2012-09-14 NOTE — Patient Instructions (Signed)
[facility] Discharge Instructions for Patients Receiving Chemotherapy  Today you received the following chemotherapy agents VP 16 (Etoposide) To help prevent nausea and vomiting after your treatment, we encourage you to take your nausea medication as prescribed.   If you develop nausea and vomiting that is not controlled by your nausea medication, call the clinic.   BELOW ARE SYMPTOMS THAT SHOULD BE REPORTED IMMEDIATELY:  *FEVER GREATER THAN 100.5 F  *CHILLS WITH OR WITHOUT FEVER  NAUSEA AND VOMITING THAT IS NOT CONTROLLED WITH YOUR NAUSEA MEDICATION  *UNUSUAL SHORTNESS OF BREATH  *UNUSUAL BRUISING OR BLEEDING  TENDERNESS IN MOUTH AND THROAT WITH OR WITHOUT PRESENCE OF ULCERS  *URINARY PROBLEMS  *BOWEL PROBLEMS  UNUSUAL RASH Items with * indicate a potential emergency and should be followed up as soon as possible.  Feel free to call the clinic you have any questions or concerns. The clinic phone number is [phone].    

## 2012-09-15 ENCOUNTER — Ambulatory Visit (HOSPITAL_BASED_OUTPATIENT_CLINIC_OR_DEPARTMENT_OTHER): Payer: Medicare Other

## 2012-09-15 VITALS — BP 152/83 | HR 78 | Temp 98.3°F

## 2012-09-15 DIAGNOSIS — C3490 Malignant neoplasm of unspecified part of unspecified bronchus or lung: Secondary | ICD-10-CM

## 2012-09-15 DIAGNOSIS — Z5189 Encounter for other specified aftercare: Secondary | ICD-10-CM

## 2012-09-15 MED ORDER — PEGFILGRASTIM INJECTION 6 MG/0.6ML
6.0000 mg | Freq: Once | SUBCUTANEOUS | Status: AC
  Administered 2012-09-15: 6 mg via SUBCUTANEOUS
  Filled 2012-09-15: qty 0.6

## 2012-09-15 NOTE — Progress Notes (Signed)
[facility] [phone]   [phone]  OFFICE PROGRESS NOTE  No PCP Per Patient [address] [place] [address]  DIAGNOSIS AND STAGE: Extensive stage small cell lung cancer with large obstructing Center left lung mass with large left pleural effusion, liver and brain metastasis diagnosed in June of 2014   PRIOR THERAPY: Status post whole brain irradiation under the care of Dr. Basilio Cairo completed 09/11/2012  CURRENT THERAPY:  1) Systemic chemotherapy with carboplatin for AUC of 5 on day 1 and etoposide 120 mg/M2 on days 1, 2 and 3 with Neulasta support on day 4, status post 2 cycles. First cycle started 07/18/2012. 2) whole brain irradiation under the care of Dr. Basilio Cairo expected to be completed on 09/11/2012.  CHEMOTHERAPY INTENT: Palliative  CURRENT # OF CHEMOTHERAPY CYCLES: 2  CURRENT ANTIEMETICS: Zofran, dexamethasone and Compazine  CURRENT SMOKING STATUS: Former smoker  ORAL CHEMOTHERAPY AND CONSENT: None  CURRENT BISPHOSPHONATES USE: None  PAIN MANAGEMENT: 0/10 on Vicodin  NARCOTICS INDUCED CONSTIPATION: None  LIVING WILL AND CODE STATUS: no CODE BLUE  INTERVAL HISTORY: Cheryl Nielsen 72 y.o. female returns to the clinic today for follow up visit accompanied by her daughter. The patient is feeling fine today with no specific complaints except for mild throat soreness. She reports that she has discontinued the Carafate, Magic mouthwash and Zantac. She is now taking Nexium only and feels that this is sufficient. She voiced no other complaints today. She does request that we reschedule her cycle #4 of her chemotherapy to start on September 24 to allow her to take a previously planned and much needed trip of with her siblings.. She denied having any significant nausea or vomiting. She denied having any chest pain, shortness breath, cough or hemoptysis. She has no fever or chills. She has no significant weight loss or night sweats. The  patient tolerated the second cycle of her chemotherapy fairly well. She completed palliative whole brain irradiation under the care of Dr. Basilio Cairo on 09/11/2012.   MEDICAL HISTORY: Past Medical History  Diagnosis Date  . Pneumonia, organism unspecified   . Lung cancer 07/05/12    Left Mainstem Bronchus- Small Cell Carcinoma    ALLERGIES:  is allergic to asa and codeine.  MEDICATIONS:  Current Outpatient Prescriptions  Medication Sig Dispense Refill  . acetaminophen (TYLENOL) 500 MG tablet Take 1,000 mg by mouth every 6 (six) hours as needed (For headache.).      Marland Kitchen Alum & Mag Hydroxide-Simeth (MAGIC MOUTHWASH W/LIDOCAINE) SOLN 1 part benadryl, 1 part nystatin, 1 part cherry extra strength Maalox Plus, 3 parts 2% viscous lidocaine. Swallow 10mL up to QID, 30 min before meals, for soreness in esophagus.  480 mL  2  . esomeprazole (NEXIUM) 10 MG packet Take 10 mg by mouth daily before breakfast.      . glucosamine-chondroitin 500-400 MG tablet Take 1 tablet by mouth daily with lunch.       Marland Kitchen HYDROcodone-acetaminophen (NORCO/VICODIN) 5-325 MG per tablet Take 1 tablet by mouth every 6 (six) hours as needed for pain. Q 4-6 hrs prn pain      . lidocaine-prilocaine (EMLA) cream Apply 1 application topically daily as needed (Applies to port-a-cath.).      Marland Kitchen Multiple Vitamin (MULTIVITAMIN WITH MINERALS) TABS Take 1 tablet by mouth daily with lunch. She takes Surveyor, quantity Adult 50+.      . ondansetron (ZOFRAN) 4 MG tablet Take 4 mg by mouth every 4 (four) hours  as needed for nausea.      . prochlorperazine (COMPAZINE) 10 MG tablet TAKE 1 TABLET BY MOUTH EVERY 6 HOURS AS NEEDED  60 tablet  0  . ranitidine (ZANTAC) 150 MG tablet Take 150 mg by mouth daily as needed for heartburn.       . sucralfate (CARAFATE) 1 GM/10ML suspension Take 1 g by mouth 4 (four) times daily -  with meals and at bedtime.       No current facility-administered medications for this visit.    SURGICAL HISTORY:  Past Surgical  History  Procedure Laterality Date  . Nasal sinus surgery    . Video bronchoscopy Bilateral 07/05/2012    Procedure: VIDEO BRONCHOSCOPY WITHOUT FLUORO;  Surgeon: Nyoka Cowden, MD;  Location: [facility];  Service: Cardiopulmonary;  Laterality: Bilateral;    REVIEW OF SYSTEMS:  Pertinent items are noted in HPI.   PHYSICAL EXAMINATION: General appearance: alert, cooperative, fatigued and no distress Head: Normocephalic, without obvious abnormality, atraumatic Neck: no adenopathy Lymph nodes: Cervical, supraclavicular, and axillary nodes normal. Resp: clear to auscultation bilaterally Cardio: regular rate and rhythm, S1, S2 normal, no murmur, click, rub or gallop GI: soft, non-tender; bowel sounds normal; no masses,  no organomegaly Extremities: extremities normal, atraumatic, no cyanosis or edema Neurologic: Alert and oriented X 3, normal strength and tone. Normal symmetric reflexes. Normal coordination and gait Oropharynx is without thrush or mucositis  ECOG PERFORMANCE STATUS: 1 - Symptomatic but completely ambulatory  Blood pressure 136/81, pulse 77, temperature 98.1 F (36.7 C), temperature source Oral, resp. rate 18, height 5\' 2"  (1.575 m), weight 111 lb 6.4 oz (50.531 kg).  LABORATORY DATA: Lab Results  Component Value Date   WBC 6.7 09/12/2012   HGB 13.3 09/12/2012   HCT 40.6 09/12/2012   MCV 82.2 09/12/2012   PLT 223 09/12/2012      Chemistry      Component Value Date/Time   NA 138 09/12/2012 1146   NA 133* 06/27/2012 1706   K 4.3 09/12/2012 1146   K 4.5 06/27/2012 1706   CL 93* 07/10/2012 1544   CL 95* 06/27/2012 1706   CO2 27 09/12/2012 1146   CO2 25 06/27/2012 1706   BUN 17.5 09/12/2012 1146   BUN 14 06/27/2012 1706   CREATININE 0.8 09/12/2012 1146   CREATININE 1.0 06/27/2012 1706      Component Value Date/Time   CALCIUM 9.1 09/12/2012 1146   CALCIUM 9.1 06/27/2012 1706   ALKPHOS 107 09/12/2012 1146   AST 18 09/12/2012 1146   ALT 14 09/12/2012 1146   BILITOT 0.29  09/12/2012 1146       RADIOGRAPHIC STUDIES: Ct Chest W Contrast  08/28/2012   *RADIOLOGY REPORT*  Clinical Data:  Lung cancer.  Brain metastasis.  Radiation therapy ongoing.  CT CHEST, ABDOMEN AND PELVIS WITH CONTRAST  Technique:  Multidetector CT imaging of the chest, abdomen and pelvis was performed following the standard protocol during bolus administration of intravenous contrast.  Contrast: OMNIPAQUE IOHEXOL 300 MG/ML  SOLN  Comparison:  CT 06/27 1014   CT CHEST  Findings:  A port in the right anterior chest wall.  No supraclavicular lymphadenopathy.  There is some reduction in the bulky mediastinal lymphadenopathy.  The prevascular adenopathy measures 5 mm short axis thickness compared to 18 mm on prior. Subcarinal node measures 7 mm short axis compared to 23 mm on prior.  There is interval expansion of the left upper lobe and left lower lobe.  There is a  persistent left hilar mass measuring approximately 5.0 x 2.9 cm.  Small left effusion remains.  Within the right lung there are no new or suspicious pulmonary nodules.  IMPRESSION:  1.  Interval expansion of the left upper lobe and left lower lobe. 2.  Persistent left hilar mass.  3.  Significant reduction in  mediastinal lymphadenopathy.    CT ABDOMEN AND PELVIS  Findings:  Hepatic lesions are decreased in size.  No new hepatic lesions are present.  Exemplary lesion in the inferior medial right hepatic lobe measures 8 mm compared to 33 mm on prior.  Adrenal lesions are also decreased.  The left gland measures 20 mm x 14 mm decreased from 29 x 19 mm on prior.  Small bowel, and colon unremarkable.  Abdominal aorta normal caliber.  No retroperitoneal periportal lymphadenopathy.  No mesenteric disease.  The bladder is normal.  Uterus is normal.  No pelvic lymphadenopathy.  Sclerotic lesions in the spine are more conspicuous.  Exemplary lesions are sclerotic lesion within the L4 vertebral body and the T8 vertebral body (sagittal image 46 and 49)  respectively which are more conspicuous than comparison exam.  IMPRESSION: 1.  Reduction in size of hepatic metastasis. 2.  Mild reduction in size of adrenal metastasis. The 3.  Skeletal metastasis are more conspicuous than comparison exam. This could indicate a healing response versus disease progression.   Original Report Authenticated By: Genevive Bi, M.D.   ASSESSMENT AND PLAN: this is a very pleasant 72 years old white female with extensive stage small cell lung cancer with multiple brain metastasis and large left lung mass as well as left pleural effusion, and liver metastasis. She is status post 2 cycle of systemic chemotherapy with carboplatin and etoposide with significant improvement in her disease in the lung and liver. Patient was discussed with Dr. Arbutus Ped. Her counts are within therapeutic range today. She will proceed with cycle #3 of her systemic chemotherapy with carboplatin and etoposide with Neulasta support as scheduled. We will on her her request and reschedule cycle #4 to begin 10/11/2012 so that she may take her trip with her siblings. She'll followup with Dr. Arbutus Ped with repeat CBC differential and C. met on 10/11/2012 prior to starting cycle #4 of her chemotherapy.  Laural Benes, Quentez Lober E, PA-C   She was advised to call immediately if she has any concerning symptoms in the interval.  The patient voices understanding of current disease status and treatment options and is in agreement with the current care plan.  All questions were answered. The patient knows to call the clinic with any problems, questions or concerns. We can certainly see the patient much sooner if necessary.  I spent 20 minutes counseling the patient face to face. The total time spent in the appointment was 30 minutes.

## 2012-09-15 NOTE — Patient Instructions (Addendum)
Continue with weekly labs as scheduled Per your request we will reschedule cycle #4 of your systemic chemotherapy to start on 10/11/2012 to allow you to take your planned trip with your siblings. Follow with Dr. Arbutus Ped on 10/11/2012

## 2012-09-16 ENCOUNTER — Other Ambulatory Visit: Payer: Self-pay

## 2012-09-16 ENCOUNTER — Emergency Department (HOSPITAL_COMMUNITY)
Admission: EM | Admit: 2012-09-16 | Discharge: 2012-09-17 | Disposition: A | Discharge status: Home / Self Care | Payer: Medicare Other | Attending: Emergency Medicine | Admitting: Emergency Medicine

## 2012-09-16 ENCOUNTER — Encounter (HOSPITAL_COMMUNITY): Payer: Self-pay | Admitting: Emergency Medicine

## 2012-09-16 ENCOUNTER — Emergency Department (HOSPITAL_COMMUNITY): Payer: Medicare Other

## 2012-09-16 DIAGNOSIS — R0789 Other chest pain: Secondary | ICD-10-CM | POA: Insufficient documentation

## 2012-09-16 DIAGNOSIS — Z8701 Personal history of pneumonia (recurrent): Secondary | ICD-10-CM | POA: Insufficient documentation

## 2012-09-16 DIAGNOSIS — Z79899 Other long term (current) drug therapy: Secondary | ICD-10-CM | POA: Insufficient documentation

## 2012-09-16 DIAGNOSIS — Z85118 Personal history of other malignant neoplasm of bronchus and lung: Secondary | ICD-10-CM | POA: Insufficient documentation

## 2012-09-16 DIAGNOSIS — R0989 Other specified symptoms and signs involving the circulatory and respiratory systems: Secondary | ICD-10-CM | POA: Insufficient documentation

## 2012-09-16 DIAGNOSIS — Z87891 Personal history of nicotine dependence: Secondary | ICD-10-CM | POA: Insufficient documentation

## 2012-09-16 LAB — TROPONIN I: Troponin I: <0.3 ng/mL (ref <0.30)

## 2012-09-16 LAB — CBC
MCH: 26.9 pg (ref 26.0–34.0)
MCHC: 32.1 g/dL (ref 30.0–36.0)
RBC: 4.27 MIL/uL (ref 3.87–5.11)
WBC: 50.7 10*3/uL (ref 4.0–10.5)

## 2012-09-16 LAB — BASIC METABOLIC PANEL
BUN: 32 mg/dL — ABNORMAL HIGH (ref 6–23)
Calcium: 8.7 mg/dL (ref 8.4–10.5)
GFR calc non Af Amer: 42 mL/min — ABNORMAL LOW (ref >90)
Glucose, Bld: 96 mg/dL (ref 70–99)
Potassium: 4.2 mEq/L (ref 3.5–5.1)

## 2012-09-16 MED ORDER — ONDANSETRON HCL 4 MG/2ML IJ SOLN: 4.0000 mg | Freq: Once | INTRAMUSCULAR | Status: DC

## 2012-09-16 MED ORDER — SODIUM CHLORIDE 0.9 % IV BOLUS (SEPSIS)
1000.0000 mL | Freq: Once | INTRAVENOUS | Status: AC
  Administered 2012-09-16: 1000 mL via INTRAVENOUS

## 2012-09-16 MED ORDER — TRAMADOL HCL 50 MG PO TABS
100.0000 mg | ORAL_TABLET | Freq: Once | ORAL | Status: AC
  Administered 2012-09-16: 100 mg via ORAL
  Filled 2012-09-16 (×2): qty 1

## 2012-09-16 MED ORDER — SODIUM CHLORIDE 0.9 % IV SOLN
INTRAVENOUS | Status: DC
  Administered 2012-09-16: 23:00:00 via INTRAVENOUS

## 2012-09-16 MED ORDER — LIDOCAINE-PRILOCAINE 2.5-2.5 % EX CREA
TOPICAL_CREAM | Freq: Once | CUTANEOUS | Status: AC
  Administered 2012-09-16: 22:00:00 via TOPICAL
  Filled 2012-09-16: qty 5

## 2012-09-16 MED ORDER — PROCHLORPERAZINE MALEATE 10 MG PO TABS
10.0000 mg | ORAL_TABLET | Freq: Once | ORAL | Status: DC
  Filled 2012-09-16: qty 1

## 2012-09-16 MED ORDER — HYDROCODONE-ACETAMINOPHEN 5-325 MG PO TABS: 1.0000 | ORAL_TABLET | Freq: Four times a day (QID) | ORAL | Status: DC | PRN

## 2012-09-16 NOTE — ED Notes (Signed)
Pt arrived to the Ed with a complaint of chest pain.  Ot is a cancer pt and has finished her round of radiation therapy and is on her third round of chemotherapy.  Pt has been complaining of chest pain x 2 days .   States it is located in her central chest.  States it feels like an elephant on her chest.  Pt has no radiation pain associated with this pain.   Pt has a port-a-cath but is requesting numbing cream

## 2012-09-16 NOTE — ED Provider Notes (Signed)
CSN: [id number]     Arrival date & time 09/16/12  2048 History   First MD Initiated Contact with Patient 09/16/12 2118     Chief Complaint  Patient presents with  . 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 She reports she's been having chest pain for the past few weeks but it is not daily. Last night she had the pain and it was constant however it finally went away and she was able to go to sleep. When she woke up this morning she did not have the pain. The pain did not start again until 7:30 this evening when she was just sitting doing nothing. Her pain is located in the center of her chest and described as dull and a pressure discomfort. She states nothing she does makes the pain come on or makes it hurt more including coughing moving walking. She states she took acetaminophen and the pain seemed to get better. She denies shortness of breath, diaphoresis, radiation of the pain, burning sensation in her throat or behind her chest, pain or swelling in her legs, cough, fever, nausea, or vomiting. She states her pain is currently a 1/10 and at its worse was an 8/10 and that was earlier this evening. She reports she got a Neulasta injection yesterday. She does not associate the pain with getting Neulasta after her chemotherapy.  She reports she had some burning of her esophagus and she was started on the Nexium for that.  She relates her mother died of an MI at age 78, she states her father had a pacemaker and died at age 20  PCP None Oncologist Dr Arbutus Ped  Past Medical History  Diagnosis Date  . Pneumonia, organism unspecified   . Lung cancer 07/05/12    Left Mainstem Bronchus- Small Cell Carcinoma   Past Surgical History  Procedure Laterality Date  . Nasal sinus surgery    . Video bronchoscopy  Bilateral 07/05/2012    Procedure: VIDEO BRONCHOSCOPY WITHOUT FLUORO;  Surgeon: Nyoka Cowden, MD;  Location: [facility];  Service: Cardiopulmonary;  Laterality: Bilateral;   Family History  Problem Relation Age of Onset  . Heart disease Mother   . Heart disease Father     PVD  . Prostate cancer Father   . Lung cancer Mother 24    was a smoker  . Colon cancer Brother    History  Substance Use Topics  . Smoking status: Former Smoker -- 1.00 packs/day for 40 years    Types: Cigarettes    Quit date: 04/18/2012  . Smokeless tobacco: Never Used  . Alcohol Use: No   Lives at home  OB History   Grav Para Term Preterm Abortions TAB SAB Ect Mult Living                 Review of Systems  All other systems reviewed and are negative.    Allergies  Asa and Codeine  Home Medications   Current Outpatient Rx  Name  Route  Sig  Dispense  Refill  . acetaminophen (TYLENOL) 500 MG tablet   Oral   Take 1,000 mg by mouth every 6 (six) hours as needed (For headache.).         Marland Kitchen esomeprazole (NEXIUM) 10 MG packet   Oral   Take 10 mg by mouth daily before breakfast.         .  glucosamine-chondroitin 500-400 MG tablet   Oral   Take 1 tablet by mouth daily with lunch.          . lidocaine-prilocaine (EMLA) cream   Topical   Apply 1 application topically daily as needed (Applies to port-a-cath.).         Marland Kitchen Multiple Vitamin (MULTIVITAMIN WITH MINERALS) TABS   Oral   Take 1 tablet by mouth daily with lunch. She takes Surveyor, quantity Adult 50+.         . prochlorperazine (COMPAZINE) 10 MG tablet      TAKE 1 TABLET BY MOUTH EVERY 6 HOURS AS NEEDED   60 tablet   0    BP 140/84  Pulse 84  Temp(Src) 98.3 F (36.8 C) (Oral)  Resp 20  SpO2 96%  Vital signs normal   Physical Exam  Nursing note and vitals reviewed. Constitutional: She is oriented to person, place, and time. She appears well-developed and well-nourished.  Non-toxic appearance. She does not appear ill.  No distress.  HENT:  Head: Normocephalic and atraumatic.  Right Ear: External ear normal.  Left Ear: External ear normal.  Nose: Nose normal. No mucosal edema or rhinorrhea.  Mouth/Throat: Oropharynx is clear and moist and mucous membranes are normal. No dental abscesses or edematous.  Eyes: Conjunctivae and EOM are normal. Pupils are equal, round, and reactive to light.  Neck: Normal range of motion and full passive range of motion without pain. Neck supple.  Cardiovascular: Normal rate, regular rhythm and normal heart sounds.  Exam reveals no gallop and no friction rub.   No murmur heard. Pulmonary/Chest: Effort normal. No respiratory distress. She has no wheezes. She has rhonchi. She has no rales. She exhibits no tenderness and no crepitus.    Chest nontender, area of pain noted. She has scattered rhonchi but states she is not SOB and feels fine.   Abdominal: Soft. Normal appearance and bowel sounds are normal. She exhibits no distension. There is no tenderness. There is no rebound and no guarding.  Musculoskeletal: Normal range of motion. She exhibits no edema and no tenderness.  Moves all extremities well.   Neurological: She is alert and oriented to person, place, and time. She has normal strength. No cranial nerve deficit.  Skin: Skin is warm, dry and intact. No rash noted. No erythema. No pallor.  Psychiatric: She has a normal mood and affect. Her speech is normal and behavior is normal. Her mood appears not anxious.    ED Course  Procedures (including critical care time) Medications  0.9 %  sodium chloride infusion ( Intravenous New Bag/Given 09/16/12 2306)  prochlorperazine (COMPAZINE) tablet 10 mg (not administered)  ondansetron (ZOFRAN) injection 4 mg (not administered)  lidocaine-prilocaine (EMLA) cream ( Topical Given 09/16/12 2226)  traMADol (ULTRAM) tablet 100 mg (100 mg Oral Given 09/16/12 2318)  sodium chloride 0.9 % bolus 1,000 mL (1,000 mLs Intravenous New Bag/Given  09/16/12 2321)     Patient's initiation of care delayed because patient insisted on having the EMLA cream applied to her vascular access before she has any blood work or IV started.  Pt given results of her tests, she states she wants to go home and will drink more fluids. Review of her tests shows she had recent CT chest without obvious mets to chest wall.   23:50 Pt vomited after tramadol, wants compazine which we only have oral, given zofran IV first.   Labs Review    Results for orders placed during the hospital encounter  of 09/16/12  CBC      Result Value Range   WBC 50.7 (*) 4.0 - 10.5 K/uL   RBC 4.27  3.87 - 5.11 MIL/uL   Hemoglobin 11.5 (*) 12.0 - 15.0 g/dL   HCT 16.1 (*) 09.6 - 04.5 %   MCV 83.8  78.0 - 100.0 fL   MCH 26.9  26.0 - 34.0 pg   MCHC 32.1  30.0 - 36.0 g/dL   RDW 40.9 (*) 81.1 - 91.4 %   Platelets 161  150 - 400 K/uL  BASIC METABOLIC PANEL      Result Value Range   Sodium 130 (*) 135 - 145 mEq/L   Potassium 4.2  3.5 - 5.1 mEq/L   Chloride 96  96 - 112 mEq/L   CO2 26  19 - 32 mEq/L   Glucose, Bld 96  70 - 99 mg/dL   BUN 32 (*) 6 - 23 mg/dL   Creatinine, Ser 7.82 (*) 0.50 - 1.10 mg/dL   Calcium 8.7  8.4 - 95.6 mg/dL   GFR calc non Af Amer 42 (*) >90 mL/min   GFR calc Af Amer 49 (*) >90 mL/min  PRO B NATRIURETIC PEPTIDE      Result Value Range   Pro B Natriuretic peptide (BNP) 274.4 (*) 0 - 125 pg/mL  D-DIMER, QUANTITATIVE      Result Value Range   D-Dimer, Quant 0.36  0.00 - 0.48 ug/mL-FEU  TROPONIN I      Result Value Range   Troponin I <0.30  <0.30 ng/mL   Laboratory interpretation all normal except leukocytosis from neulasta injection, hyponatremia, new renal insuffic   Results for orders placed in visit on 09/12/12  CBC WITH DIFFERENTIAL      Result Value Range   WBC 6.7  3.9 - 10.3 10e3/uL   NEUT# 4.8  1.5 - 6.5 10e3/uL   HGB 13.3  11.6 - 15.9 g/dL   HCT 21.3  08.6 - 57.8 %   Platelets 223  145 - 400 10e3/uL   MCV 82.2  79.5 - 101.0 fL     MCH 26.9  25.1 - 34.0 pg   MCHC 32.8  31.5 - 36.0 g/dL   RBC 4.69  6.29 - 5.28 10e6/uL   RDW 18.5 (*) 11.2 - 14.5 %   lymph# 0.7 (*) 0.9 - 3.3 10e3/uL   MONO# 1.2 (*) 0.1 - 0.9 10e3/uL   Eosinophils Absolute 0.1  0.0 - 0.5 10e3/uL   Basophils Absolute 0.0  0.0 - 0.1 10e3/uL   NEUT% 71.4  38.4 - 76.8 %   LYMPH% 9.8 (*) 14.0 - 49.7 %   MONO% 17.2 (*) 0.0 - 14.0 %   EOS% 1.0  0.0 - 7.0 %   BASO% 0.6  0.0 - 2.0 %   nRBC 0  0 - 0 %  COMPREHENSIVE METABOLIC PANEL (CC13)      Result Value Range   Sodium 138  136 - 145 mEq/L   Potassium 4.3  3.5 - 5.1 mEq/L   Chloride 102  98 - 109 mEq/L   CO2 27  22 - 29 mEq/L   Glucose 77  70 - 140 mg/dl   BUN 41.3  7.0 - 24.4 mg/dL   Creatinine 0.8  0.6 - 1.1 mg/dL   Total Bilirubin 0.10  0.20 - 1.20 mg/dL   Alkaline Phosphatase 107  40 - 150 U/L   AST 18  5 - 34 U/L   ALT 14  0 - 55  U/L   Total Protein 6.8  6.4 - 8.3 g/dL   Albumin 3.5  3.5 - 5.0 g/dL   Calcium 9.1  8.4 - 13.2 mg/dL      Imaging Review Dg Chest 2 View  09/16/2012   *RADIOLOGY REPORT*  Clinical Data: Chest pain  CHEST - 2 VIEW  Comparison: Prior CT from 08/28/2012  Findings: Cardiac and mediastinal silhouettes are stable in size and contour.  Right-sided Port-A-Cath tip terminates in the distal SVC.  The left perihilar mass with spiculated margins is grossly similar as compared to the most recent CT examination.  There is volume loss within the left lung with elevation of the left hemidiaphragm and right to left bowing of the tracheal air column.  The left upper and lower lobes remain mostly aerated. The right lung is clear.  There is no pulmonary edema.  No definite airspace opacity identified.  Osseous structures and soft tissues are unchanged.  IMPRESSION: Similar size and appearance of left perihilar mass with associated left lung volume loss, likely due to central compression.   Original Report Authenticated By: Rise Mu, M.D.    August 08, 2012 CT CHEST   IMPRESSION:  1. Interval expansion of the left upper lobe and left lower lobe.  2. Persistent left hilar mass.  3. Significant reduction in mediastinal lymphadenopathy.  CT ABDOMEN AND PELVIS  IMPRESSION:  1. Reduction in size of hepatic metastasis.  2. Mild reduction in size of adrenal metastasis. The  3. Skeletal metastasis are more conspicuous than comparison exam.  This could indicate a healing response versus disease progression.  Original Report Authenticated By: Genevive Bi, M.D.    Ct Chest W Contrast  08/28/2012   IMPRESSION:  1.  Interval expansion of the left upper lobe and left lower lobe. 2.  Persistent left hilar mass.  3.  Significant reduction in  mediastinal lymphadenopathy.  .   Original Report Authenticated By: Genevive Bi, M.D.   Ct Abdomen Pelvis W Contrast  08/28/2012  IMPRESSION: 1.  Reduction in size of hepatic metastasis. 2.  Mild reduction in size of adrenal metastasis. The 3.  Skeletal metastasis are more conspicuous than comparison exam. This could indicate a healing response versus disease progression.   Original Report Authenticated By: Genevive Bi, M.D.     Date: 09/16/2012  Rate: 88  Rhythm: normal sinus rhythm  QRS Axis: normal  Intervals: normal  ST/T Wave abnormalities: normal  Conduction Disutrbances:none  Narrative Interpretation: RAE, Q waves inf and anteroseptal  Old EKG Reviewed: none available    MDM   1. Atypical chest pain      New Prescriptions   HYDROCODONE-ACETAMINOPHEN (NORCO/VICODIN) 5-325 MG PER TABLET    Take 1 tablet by mouth every 6 (six) hours as needed for pain.    Plan discharge   Devoria Albe, MD, Franz Dell, MD 09/16/12 2352

## 2012-09-20 ENCOUNTER — Other Ambulatory Visit (HOSPITAL_BASED_OUTPATIENT_CLINIC_OR_DEPARTMENT_OTHER): Payer: Medicare Other

## 2012-09-20 DIAGNOSIS — C3490 Malignant neoplasm of unspecified part of unspecified bronchus or lung: Secondary | ICD-10-CM

## 2012-09-20 LAB — COMPREHENSIVE METABOLIC PANEL (CC13)
BUN: 19.3 mg/dL (ref 7.0–26.0)
CO2: 23 mEq/L (ref 22–29)
Calcium: 9.1 mg/dL (ref 8.4–10.4)
Chloride: 102 mEq/L (ref 98–109)
Creatinine: 0.9 mg/dL (ref 0.6–1.1)
Glucose: 92 mg/dl (ref 70–140)

## 2012-09-20 LAB — CBC WITH DIFFERENTIAL/PLATELET
Basophils Absolute: 0 10*3/uL (ref 0.0–0.1)
EOS%: 1.1 % (ref 0.0–7.0)
Eosinophils Absolute: 0.1 10*3/uL (ref 0.0–0.5)
HCT: 37.8 % (ref 34.8–46.6)
HGB: 12.4 g/dL (ref 11.6–15.9)
MCH: 27.4 pg (ref 25.1–34.0)
MONO#: 0.4 10*3/uL (ref 0.1–0.9)
NEUT#: 6.7 10*3/uL — ABNORMAL HIGH (ref 1.5–6.5)
NEUT%: 87.3 % — ABNORMAL HIGH (ref 38.4–76.8)
lymph#: 0.5 10*3/uL — ABNORMAL LOW (ref 0.9–3.3)

## 2012-09-27 ENCOUNTER — Other Ambulatory Visit (HOSPITAL_BASED_OUTPATIENT_CLINIC_OR_DEPARTMENT_OTHER): Payer: Medicare Other | Admitting: Lab

## 2012-09-27 DIAGNOSIS — C3490 Malignant neoplasm of unspecified part of unspecified bronchus or lung: Secondary | ICD-10-CM

## 2012-09-27 LAB — CBC WITH DIFFERENTIAL/PLATELET
Basophils Absolute: 0.1 10*3/uL (ref 0.0–0.1)
HCT: 37.7 % (ref 34.8–46.6)
HGB: 12.4 g/dL (ref 11.6–15.9)
MONO#: 1.2 10*3/uL — ABNORMAL HIGH (ref 0.1–0.9)
NEUT#: 12.7 10*3/uL — ABNORMAL HIGH (ref 1.5–6.5)
NEUT%: 83.4 % — ABNORMAL HIGH (ref 38.4–76.8)
RDW: 20.3 % — ABNORMAL HIGH (ref 11.2–14.5)
WBC: 15.2 10*3/uL — ABNORMAL HIGH (ref 3.9–10.3)
lymph#: 1.2 10*3/uL (ref 0.9–3.3)

## 2012-09-27 LAB — COMPREHENSIVE METABOLIC PANEL (CC13)
ALT: 19 U/L (ref 0–55)
Albumin: 3.5 g/dL (ref 3.5–5.0)
BUN: 9.1 mg/dL (ref 7.0–26.0)
CO2: 30 mEq/L — ABNORMAL HIGH (ref 22–29)
Calcium: 9.4 mg/dL (ref 8.4–10.4)
Chloride: 100 mEq/L (ref 98–109)
Creatinine: 0.9 mg/dL (ref 0.6–1.1)
Potassium: 4.3 mEq/L (ref 3.5–5.1)

## 2012-10-03 ENCOUNTER — Other Ambulatory Visit: Payer: Self-pay | Admitting: Internal Medicine

## 2012-10-04 ENCOUNTER — Other Ambulatory Visit (HOSPITAL_BASED_OUTPATIENT_CLINIC_OR_DEPARTMENT_OTHER): Payer: Medicare Other

## 2012-10-04 DIAGNOSIS — C3490 Malignant neoplasm of unspecified part of unspecified bronchus or lung: Secondary | ICD-10-CM

## 2012-10-04 LAB — CBC WITH DIFFERENTIAL/PLATELET
BASO%: 0.4 % (ref 0.0–2.0)
EOS%: 0.4 % (ref 0.0–7.0)
HCT: 38.9 % (ref 34.8–46.6)
HGB: 13 g/dL (ref 11.6–15.9)
MCH: 28 pg (ref 25.1–34.0)
MCHC: 33.3 g/dL (ref 31.5–36.0)
MONO#: 0.4 10*3/uL (ref 0.1–0.9)
NEUT%: 81.8 % — ABNORMAL HIGH (ref 38.4–76.8)
RDW: 21.3 % — ABNORMAL HIGH (ref 11.2–14.5)
WBC: 6.4 10*3/uL (ref 3.9–10.3)
lymph#: 0.7 10*3/uL — ABNORMAL LOW (ref 0.9–3.3)

## 2012-10-04 LAB — COMPREHENSIVE METABOLIC PANEL (CC13)
ALT: 13 U/L (ref 0–55)
AST: 15 U/L (ref 5–34)
Albumin: 3.5 g/dL (ref 3.5–5.0)
CO2: 28 mEq/L (ref 22–29)
Calcium: 9.6 mg/dL (ref 8.4–10.4)
Chloride: 101 mEq/L (ref 98–109)
Creatinine: 1 mg/dL (ref 0.6–1.1)
Potassium: 4.4 mEq/L (ref 3.5–5.1)
Total Protein: 7 g/dL (ref 6.4–8.3)

## 2012-10-11 ENCOUNTER — Telehealth: Payer: Self-pay | Admitting: *Deleted

## 2012-10-11 ENCOUNTER — Ambulatory Visit (HOSPITAL_BASED_OUTPATIENT_CLINIC_OR_DEPARTMENT_OTHER): Payer: Medicare Other | Admitting: Internal Medicine

## 2012-10-11 ENCOUNTER — Other Ambulatory Visit (HOSPITAL_BASED_OUTPATIENT_CLINIC_OR_DEPARTMENT_OTHER): Payer: Medicare Other | Admitting: Lab

## 2012-10-11 ENCOUNTER — Telehealth: Payer: Self-pay | Admitting: Internal Medicine

## 2012-10-11 ENCOUNTER — Ambulatory Visit (HOSPITAL_BASED_OUTPATIENT_CLINIC_OR_DEPARTMENT_OTHER): Payer: Medicare Other

## 2012-10-11 ENCOUNTER — Encounter: Payer: Self-pay | Admitting: Internal Medicine

## 2012-10-11 VITALS — BP 98/62 | HR 94 | Temp 97.5°F | Resp 17 | Ht 62.0 in | Wt 112.9 lb

## 2012-10-11 DIAGNOSIS — C787 Secondary malignant neoplasm of liver and intrahepatic bile duct: Secondary | ICD-10-CM

## 2012-10-11 DIAGNOSIS — C3490 Malignant neoplasm of unspecified part of unspecified bronchus or lung: Secondary | ICD-10-CM

## 2012-10-11 DIAGNOSIS — C3400 Malignant neoplasm of unspecified main bronchus: Secondary | ICD-10-CM

## 2012-10-11 DIAGNOSIS — Z5111 Encounter for antineoplastic chemotherapy: Secondary | ICD-10-CM

## 2012-10-11 DIAGNOSIS — Z9221 Personal history of antineoplastic chemotherapy: Secondary | ICD-10-CM

## 2012-10-11 DIAGNOSIS — J90 Pleural effusion, not elsewhere classified: Secondary | ICD-10-CM

## 2012-10-11 DIAGNOSIS — R110 Nausea: Secondary | ICD-10-CM

## 2012-10-11 DIAGNOSIS — C3492 Malignant neoplasm of unspecified part of left bronchus or lung: Secondary | ICD-10-CM

## 2012-10-11 DIAGNOSIS — C7931 Secondary malignant neoplasm of brain: Secondary | ICD-10-CM

## 2012-10-11 HISTORY — DX: Personal history of antineoplastic chemotherapy: Z92.21

## 2012-10-11 LAB — CBC WITH DIFFERENTIAL/PLATELET
BASO%: 0.6 % (ref 0.0–2.0)
Basophils Absolute: 0 10*3/uL (ref 0.0–0.1)
EOS%: 0.3 % (ref 0.0–7.0)
Eosinophils Absolute: 0 10*3/uL (ref 0.0–0.5)
HCT: 38.5 % (ref 34.8–46.6)
HGB: 12.6 g/dL (ref 11.6–15.9)
LYMPH%: 12.9 % — ABNORMAL LOW (ref 14.0–49.7)
MCH: 27.9 pg (ref 25.1–34.0)
MCHC: 32.7 g/dL (ref 31.5–36.0)
MCV: 85.2 fL (ref 79.5–101.0)
MONO#: 0.9 10*3/uL (ref 0.1–0.9)
MONO%: 12.8 % (ref 0.0–14.0)
NEUT#: 5.1 10*3/uL (ref 1.5–6.5)
NEUT%: 73.4 % (ref 38.4–76.8)
Platelets: 233 10*3/uL (ref 145–400)
RBC: 4.52 10*6/uL (ref 3.70–5.45)
RDW: 19.3 % — ABNORMAL HIGH (ref 11.2–14.5)
WBC: 7 10*3/uL (ref 3.9–10.3)
lymph#: 0.9 10*3/uL (ref 0.9–3.3)
nRBC: 0 % (ref 0–0)

## 2012-10-11 LAB — COMPREHENSIVE METABOLIC PANEL (CC13)
ALT: 10 U/L (ref 0–55)
AST: 14 U/L (ref 5–34)
Albumin: 3.3 g/dL — ABNORMAL LOW (ref 3.5–5.0)
Alkaline Phosphatase: 91 U/L (ref 40–150)
BUN: 12 mg/dL (ref 7.0–26.0)
Chloride: 100 mEq/L (ref 98–109)
Potassium: 3.8 mEq/L (ref 3.5–5.1)

## 2012-10-11 MED ORDER — SODIUM CHLORIDE 0.9 % IV SOLN
Freq: Once | INTRAVENOUS | Status: AC
  Administered 2012-10-11: 10:00:00 via INTRAVENOUS

## 2012-10-11 MED ORDER — ETOPOSIDE CHEMO INJECTION 1 GM/50ML
120.0000 mg/m2 | Freq: Once | INTRAVENOUS | Status: AC
  Administered 2012-10-11: 180 mg via INTRAVENOUS
  Filled 2012-10-11: qty 9

## 2012-10-11 MED ORDER — DEXAMETHASONE SODIUM PHOSPHATE 20 MG/5ML IJ SOLN
20.0000 mg | Freq: Once | INTRAMUSCULAR | Status: AC
  Administered 2012-10-11: 20 mg via INTRAVENOUS

## 2012-10-11 MED ORDER — SODIUM CHLORIDE 0.9 % IJ SOLN
10.0000 mL | INTRAMUSCULAR | Status: DC | PRN
  Administered 2012-10-11: 10 mL
  Filled 2012-10-11: qty 10

## 2012-10-11 MED ORDER — HEPARIN SOD (PORK) LOCK FLUSH 100 UNIT/ML IV SOLN
500.0000 [IU] | Freq: Once | INTRAVENOUS | Status: AC | PRN
  Administered 2012-10-11: 500 [IU]
  Filled 2012-10-11: qty 5

## 2012-10-11 MED ORDER — HYDROCODONE-ACETAMINOPHEN 5-325 MG PO TABS: 1.0000 | ORAL_TABLET | Freq: Four times a day (QID) | ORAL | Status: DC | PRN

## 2012-10-11 MED ORDER — ONDANSETRON 16 MG/50ML IVPB (CHCC)
16.0000 mg | Freq: Once | INTRAVENOUS | Status: AC
  Administered 2012-10-11: 16 mg via INTRAVENOUS

## 2012-10-11 MED ORDER — LIDOCAINE-PRILOCAINE 2.5-2.5 % EX CREA: 1.0000 "application " | TOPICAL_CREAM | Freq: Every day | CUTANEOUS | Status: DC | PRN

## 2012-10-11 MED ORDER — SODIUM CHLORIDE 0.9 % IV SOLN
334.0000 mg | Freq: Once | INTRAVENOUS | Status: AC
  Administered 2012-10-11: 330 mg via INTRAVENOUS
  Filled 2012-10-11: qty 33

## 2012-10-11 MED ORDER — ONDANSETRON 16 MG/50ML IVPB (CHCC)
INTRAVENOUS | Status: AC
  Filled 2012-10-11: qty 16

## 2012-10-11 MED ORDER — DEXAMETHASONE SODIUM PHOSPHATE 20 MG/5ML IJ SOLN
INTRAMUSCULAR | Status: AC
  Filled 2012-10-11: qty 5

## 2012-10-11 NOTE — Patient Instructions (Addendum)
[facility] Discharge Instructions for Patients Receiving Chemotherapy  Today you received the following chemotherapy agents: carboplatin, etoposide  To help prevent nausea and vomiting after your treatment, we encourage you to take your nausea medication.  Take it as often as prescribed.     If you develop nausea and vomiting that is not controlled by your nausea medication, call the clinic. If it is after clinic hours your family physician or the after hours number for the clinic or go to the Emergency Department.   BELOW ARE SYMPTOMS THAT SHOULD BE REPORTED IMMEDIATELY:  *FEVER GREATER THAN 100.5 F  *CHILLS WITH OR WITHOUT FEVER  NAUSEA AND VOMITING THAT IS NOT CONTROLLED WITH YOUR NAUSEA MEDICATION  *UNUSUAL SHORTNESS OF BREATH  *UNUSUAL BRUISING OR BLEEDING  TENDERNESS IN MOUTH AND THROAT WITH OR WITHOUT PRESENCE OF ULCERS  *URINARY PROBLEMS  *BOWEL PROBLEMS  UNUSUAL RASH Items with * indicate a potential emergency and should be followed up as soon as possible.  Feel free to call the clinic you have any questions or concerns. The clinic phone number is [phone].   I have been informed and understand all the instructions given to me. I know to contact the clinic, my physician, or go to the Emergency Department if any problems should occur. I do not have any questions at this time, but understand that I may call the clinic during office hours   should I have any questions or need assistance in obtaining follow up care.    __________________________________________  _____________  __________ Signature of Patient or Authorized Representative            Date                   Time    __________________________________________ Nurse's Signature    

## 2012-10-11 NOTE — Telephone Encounter (Signed)
Called pt and left message regarding lab, md and chemo for October 2014

## 2012-10-11 NOTE — Progress Notes (Signed)
[facility] [phone]   [phone]  OFFICE PROGRESS NOTE  No PCP Per Patient [address] [place] [address]  DIAGNOSIS AND STAGE: Extensive stage small cell lung cancer with large obstructing Center left lung mass with large left pleural effusion, liver and brain metastasis diagnosed in June of 2014   PRIOR THERAPY:  1) Status post whole brain irradiation under the care of Dr. Basilio Cairo completed 09/11/2012. 2) whole brain irradiation under the care of Dr. Basilio Cairo expected to be completed on 09/11/2012.   CURRENT THERAPY:  1) Systemic chemotherapy with carboplatin for AUC of 5 on day 1 and etoposide 120 mg/M2 on days 1, 2 and 3 with Neulasta support on day 4, status post 3 cycles. First cycle started 07/18/2012.    CHEMOTHERAPY INTENT: Palliative  CURRENT # OF CHEMOTHERAPY CYCLES: 3 CURRENT ANTIEMETICS: Zofran, dexamethasone and Compazine  CURRENT SMOKING STATUS: Former smoker  ORAL CHEMOTHERAPY AND CONSENT: None  CURRENT BISPHOSPHONATES USE: None  PAIN MANAGEMENT: 0/10 on Vicodin  NARCOTICS INDUCED CONSTIPATION: None  LIVING WILL AND CODE STATUS: no CODE BLUE   INTERVAL HISTORY: Cheryl Nielsen 72 y.o. female returns to the clinic today for followup visit accompanied her daughter. The patient is feeling fine today with no specific complaints except for mild fatigue. She has some sternal chest pain after the Neulasta injection of the last cycle and the patient presented to the emergency department and was found to be dehydrated. She received IV fluid and felt much better. She has occasional nausea in the morning. The patient denied having any shortness of breath, cough or hemoptysis. She denied having any fever or chills. She is here today to start cycle #4 of her systemic chemotherapy.  MEDICAL HISTORY: Past Medical History  Diagnosis Date  . Pneumonia, organism unspecified   . Lung cancer 07/05/12    Left Mainstem Bronchus- Small Cell  Carcinoma    ALLERGIES:  is allergic to asa; codeine; and tramadol.  MEDICATIONS:  Current Outpatient Prescriptions  Medication Sig Dispense Refill  . acetaminophen (TYLENOL) 500 MG tablet Take 1,000 mg by mouth every 6 (six) hours as needed (For headache.).      Marland Kitchen esomeprazole (NEXIUM) 10 MG packet Take 10 mg by mouth daily before breakfast.      . glucosamine-chondroitin 500-400 MG tablet Take 1 tablet by mouth daily with lunch.       Marland Kitchen HYDROcodone-acetaminophen (NORCO/VICODIN) 5-325 MG per tablet Take 1 tablet by mouth every 6 (six) hours as needed for pain.  10 tablet  0  . lidocaine-prilocaine (EMLA) cream Apply 1 application topically daily as needed (Applies to port-a-cath.).      Marland Kitchen Multiple Vitamin (MULTIVITAMIN WITH MINERALS) TABS Take 1 tablet by mouth daily with lunch. She takes Surveyor, quantity Adult 50+.      . prochlorperazine (COMPAZINE) 10 MG tablet TAKE 1 TABLET BY MOUTH EVERY 6 HOURS AS NEEDED  60 tablet  0   No current facility-administered medications for this visit.    SURGICAL HISTORY:  Past Surgical History  Procedure Laterality Date  . Nasal sinus surgery    . Video bronchoscopy Bilateral 07/05/2012    Procedure: VIDEO BRONCHOSCOPY WITHOUT FLUORO;  Surgeon: Nyoka Cowden, MD;  Location: [facility];  Service: Cardiopulmonary;  Laterality: Bilateral;    REVIEW OF SYSTEMS:  Constitutional: negative Eyes: negative Ears, nose, mouth, throat, and face: negative Respiratory: positive for pleurisy/chest pain Cardiovascular: negative Gastrointestinal: positive for nausea Genitourinary:negative Integument/breast: negative  Hematologic/lymphatic: negative Musculoskeletal:negative Neurological: negative Behavioral/Psych: negative Endocrine: negative Allergic/Immunologic: negative   PHYSICAL EXAMINATION: General appearance: alert, cooperative and no distress Head: Normocephalic, without obvious abnormality, atraumatic Neck: no adenopathy, no JVD, supple,  symmetrical, trachea midline and thyroid not enlarged, symmetric, no tenderness/mass/nodules Lymph nodes: Cervical, supraclavicular, and axillary nodes normal. Resp: clear to auscultation bilaterally and normal percussion bilaterally Back: symmetric, no curvature. ROM normal. No CVA tenderness. Cardio: regular rate and rhythm, S1, S2 normal, no murmur, click, rub or gallop GI: soft, non-tender; bowel sounds normal; no masses,  no organomegaly Extremities: extremities normal, atraumatic, no cyanosis or edema Neurologic: Alert and oriented X 3, normal strength and tone. Normal symmetric reflexes. Normal coordination and gait  ECOG PERFORMANCE STATUS: 1 - Symptomatic but completely ambulatory  Blood pressure 98/62, pulse 94, temperature 97.5 F (36.4 C), temperature source Oral, resp. rate 17, height 5\' 2"  (1.575 m), weight 112 lb 14.4 oz (51.211 kg).  LABORATORY DATA: Lab Results  Component Value Date   WBC 7.0 10/11/2012   HGB 12.6 10/11/2012   HCT 38.5 10/11/2012   MCV 85.2 10/11/2012   PLT 233 10/11/2012      Chemistry      Component Value Date/Time   NA 136 10/04/2012 0849   NA 130* 09/16/2012 2210   K 4.4 10/04/2012 0849   K 4.2 09/16/2012 2210   CL 96 09/16/2012 2210   CL 93* 07/10/2012 1544   CO2 28 10/04/2012 0849   CO2 26 09/16/2012 2210   BUN 15.1 10/04/2012 0849   BUN 32* 09/16/2012 2210   CREATININE 1.0 10/04/2012 0849   CREATININE 1.25* 09/16/2012 2210      Component Value Date/Time   CALCIUM 9.6 10/04/2012 0849   CALCIUM 8.7 09/16/2012 2210   ALKPHOS 109 10/04/2012 0849   AST 15 10/04/2012 0849   ALT 13 10/04/2012 0849   BILITOT 0.32 10/04/2012 0849       RADIOGRAPHIC STUDIES: Dg Chest 2 View  09/16/2012   *RADIOLOGY REPORT*  Clinical Data: Chest pain  CHEST - 2 VIEW  Comparison: Prior CT from 08/28/2012  Findings: Cardiac and mediastinal silhouettes are stable in size and contour.  Right-sided Port-A-Cath tip terminates in the distal SVC.  The left perihilar mass with  spiculated margins is grossly similar as compared to the most recent CT examination.  There is volume loss within the left lung with elevation of the left hemidiaphragm and right to left bowing of the tracheal air column.  The left upper and lower lobes remain mostly aerated. The right lung is clear.  There is no pulmonary edema.  No definite airspace opacity identified.  Osseous structures and soft tissues are unchanged.  IMPRESSION: Similar size and appearance of left perihilar mass with associated left lung volume loss, likely due to central compression.   Original Report Authenticated By: Rise Mu, M.D.    ASSESSMENT AND PLAN: This is a very pleasant 72 years old white female with extensive stage small cell lung cancer currently undergoing systemic chemotherapy with carboplatin and etoposide status post 3 cycles. The patient is tolerating her treatment fairly well. We'll proceed with cycle #4 today as scheduled. The patient would come back for followup visit in 3 weeks with repeat CT scan of the chest, abdomen and pelvis for restaging of her disease. For the nausea she was advised to take Compazine 10 mg by mouth every 6 hours as needed. For pain management the patient was given a refill of Percocet. She was also given a  refill of Emla Cream for the Port-A-Cath site. She was advised to call immediately if she has any concerning symptoms in the interval.  The patient voices understanding of current disease status and treatment options and is in agreement with the current care plan.  All questions were answered. The patient knows to call the clinic with any problems, questions or concerns. We can certainly see the patient much sooner if necessary.  I spent 15 minutes counseling the patient face to face. The total time spent in the appointment was 25 minutes.

## 2012-10-11 NOTE — Telephone Encounter (Signed)
Per staff message and POF I have scheduled appts.  JMW  

## 2012-10-11 NOTE — Patient Instructions (Signed)
CURRENT THERAPY:  1) Systemic chemotherapy with carboplatin for AUC of 5 on day 1 and etoposide 120 mg/M2 on days 1, 2 and 3 with Neulasta support on day 4, status post 3 cycles. First cycle started 07/18/2012.    CHEMOTHERAPY INTENT: Palliative  CURRENT # OF CHEMOTHERAPY CYCLES: 3 CURRENT ANTIEMETICS: Zofran, dexamethasone and Compazine  CURRENT SMOKING STATUS: Former smoker  ORAL CHEMOTHERAPY AND CONSENT: None  CURRENT BISPHOSPHONATES USE: None  PAIN MANAGEMENT: 0/10 on Vicodin  NARCOTICS INDUCED CONSTIPATION: None  LIVING WILL AND CODE STATUS: no CODE BLUE

## 2012-10-11 NOTE — Telephone Encounter (Signed)
Gave pt appt for lab and MD, emailed Michelle for chemo for October 2014 pt will drink water based contrast

## 2012-10-12 ENCOUNTER — Ambulatory Visit (HOSPITAL_BASED_OUTPATIENT_CLINIC_OR_DEPARTMENT_OTHER): Payer: Medicare Other

## 2012-10-12 VITALS — BP 111/71 | HR 88 | Temp 97.8°F | Resp 20

## 2012-10-12 DIAGNOSIS — Z5111 Encounter for antineoplastic chemotherapy: Secondary | ICD-10-CM

## 2012-10-12 DIAGNOSIS — C7931 Secondary malignant neoplasm of brain: Secondary | ICD-10-CM

## 2012-10-12 DIAGNOSIS — C3400 Malignant neoplasm of unspecified main bronchus: Secondary | ICD-10-CM

## 2012-10-12 DIAGNOSIS — C787 Secondary malignant neoplasm of liver and intrahepatic bile duct: Secondary | ICD-10-CM

## 2012-10-12 DIAGNOSIS — C3490 Malignant neoplasm of unspecified part of unspecified bronchus or lung: Secondary | ICD-10-CM

## 2012-10-12 MED ORDER — SODIUM CHLORIDE 0.9 % IJ SOLN
10.0000 mL | INTRAMUSCULAR | Status: DC | PRN
  Administered 2012-10-12: 10 mL
  Filled 2012-10-12: qty 10

## 2012-10-12 MED ORDER — DEXAMETHASONE SODIUM PHOSPHATE 10 MG/ML IJ SOLN
10.0000 mg | Freq: Once | INTRAMUSCULAR | Status: AC
  Administered 2012-10-12: 10 mg via INTRAVENOUS

## 2012-10-12 MED ORDER — HEPARIN SOD (PORK) LOCK FLUSH 100 UNIT/ML IV SOLN
500.0000 [IU] | Freq: Once | INTRAVENOUS | Status: AC | PRN
  Administered 2012-10-12: 500 [IU]
  Filled 2012-10-12: qty 5

## 2012-10-12 MED ORDER — SODIUM CHLORIDE 0.9 % IV SOLN
Freq: Once | INTRAVENOUS | Status: AC
  Administered 2012-10-12: 10:00:00 via INTRAVENOUS

## 2012-10-12 MED ORDER — SODIUM CHLORIDE 0.9 % IV SOLN
120.0000 mg/m2 | Freq: Once | INTRAVENOUS | Status: AC
  Administered 2012-10-12: 180 mg via INTRAVENOUS
  Filled 2012-10-12: qty 9

## 2012-10-12 MED ORDER — DEXAMETHASONE SODIUM PHOSPHATE 10 MG/ML IJ SOLN
INTRAMUSCULAR | Status: AC
  Filled 2012-10-12: qty 1

## 2012-10-12 MED ORDER — ONDANSETRON 8 MG/50ML IVPB (CHCC)
8.0000 mg | Freq: Once | INTRAVENOUS | Status: AC
  Administered 2012-10-12: 8 mg via INTRAVENOUS

## 2012-10-12 MED ORDER — ONDANSETRON 8 MG/NS 50 ML IVPB
INTRAVENOUS | Status: AC
  Filled 2012-10-12: qty 8

## 2012-10-12 NOTE — Patient Instructions (Addendum)
[facility] Discharge Instructions for Patients Receiving Chemotherapy  Today you received the following chemotherapy agents ETOPOSIDE  To help prevent nausea and vomiting after your treatment, we encourage you to take your nausea medication MAY TAKE NAUSEA MEDICATION ABOUT 4 PM IF NEEDED   If you develop nausea and vomiting that is not controlled by your nausea medication, call the clinic.   BELOW ARE SYMPTOMS THAT SHOULD BE REPORTED IMMEDIATELY:  *FEVER GREATER THAN 100.5 F  *CHILLS WITH OR WITHOUT FEVER  NAUSEA AND VOMITING THAT IS NOT CONTROLLED WITH YOUR NAUSEA MEDICATION  *UNUSUAL SHORTNESS OF BREATH  *UNUSUAL BRUISING OR BLEEDING  TENDERNESS IN MOUTH AND THROAT WITH OR WITHOUT PRESENCE OF ULCERS  *URINARY PROBLEMS  *BOWEL PROBLEMS  UNUSUAL RASH Items with * indicate a potential emergency and should be followed up as soon as possible.  Feel free to call the clinic you have any questions or concerns. The clinic phone number is [phone].

## 2012-10-13 ENCOUNTER — Ambulatory Visit (HOSPITAL_BASED_OUTPATIENT_CLINIC_OR_DEPARTMENT_OTHER): Payer: Medicare Other

## 2012-10-13 VITALS — BP 148/71 | HR 97 | Temp 97.5°F | Resp 18

## 2012-10-13 DIAGNOSIS — C3490 Malignant neoplasm of unspecified part of unspecified bronchus or lung: Secondary | ICD-10-CM

## 2012-10-13 DIAGNOSIS — C787 Secondary malignant neoplasm of liver and intrahepatic bile duct: Secondary | ICD-10-CM

## 2012-10-13 DIAGNOSIS — Z5111 Encounter for antineoplastic chemotherapy: Secondary | ICD-10-CM

## 2012-10-13 DIAGNOSIS — C7931 Secondary malignant neoplasm of brain: Secondary | ICD-10-CM

## 2012-10-13 DIAGNOSIS — C3400 Malignant neoplasm of unspecified main bronchus: Secondary | ICD-10-CM

## 2012-10-13 MED ORDER — SODIUM CHLORIDE 0.9 % IJ SOLN
10.0000 mL | INTRAMUSCULAR | Status: DC | PRN
  Administered 2012-10-13: 10 mL
  Filled 2012-10-13: qty 10

## 2012-10-13 MED ORDER — DEXAMETHASONE SODIUM PHOSPHATE 10 MG/ML IJ SOLN
10.0000 mg | Freq: Once | INTRAMUSCULAR | Status: AC
  Administered 2012-10-13: 10 mg via INTRAVENOUS

## 2012-10-13 MED ORDER — DEXAMETHASONE SODIUM PHOSPHATE 10 MG/ML IJ SOLN
INTRAMUSCULAR | Status: AC
  Filled 2012-10-13: qty 1

## 2012-10-13 MED ORDER — ONDANSETRON 8 MG/NS 50 ML IVPB
INTRAVENOUS | Status: AC
  Filled 2012-10-13: qty 8

## 2012-10-13 MED ORDER — SODIUM CHLORIDE 0.9 % IV SOLN
120.0000 mg/m2 | Freq: Once | INTRAVENOUS | Status: AC
  Administered 2012-10-13: 180 mg via INTRAVENOUS
  Filled 2012-10-13: qty 9

## 2012-10-13 MED ORDER — SODIUM CHLORIDE 0.9 % IV SOLN
Freq: Once | INTRAVENOUS | Status: AC
  Administered 2012-10-13: 10:00:00 via INTRAVENOUS

## 2012-10-13 MED ORDER — ONDANSETRON 8 MG/50ML IVPB (CHCC)
8.0000 mg | Freq: Once | INTRAVENOUS | Status: AC
  Administered 2012-10-13: 8 mg via INTRAVENOUS

## 2012-10-13 MED ORDER — HEPARIN SOD (PORK) LOCK FLUSH 100 UNIT/ML IV SOLN
500.0000 [IU] | Freq: Once | INTRAVENOUS | Status: AC | PRN
  Administered 2012-10-13: 500 [IU]
  Filled 2012-10-13: qty 5

## 2012-10-13 NOTE — Patient Instructions (Signed)
Etoposide, VP-16 injection What is this medicine? ETOPOSIDE, VP-16 (e toe POE side) is a chemotherapy drug. It is used to treat testicular cancer, lung cancer, and other cancers. This medicine may be used for other purposes; ask your health care provider or pharmacist if you have questions. What should I tell my health care provider before I take this medicine? They need to know if you have any of these conditions: -infection -kidney disease -low blood counts, like low white cell, platelet, or red cell counts -an unusual or allergic reaction to etoposide, other chemotherapeutic agents, other medicines, foods, dyes, or preservatives -pregnant or trying to get pregnant -breast-feeding How should I use this medicine? This medicine is for infusion into a vein. It is administered in a hospital or clinic by a specially trained health care professional. Talk to your pediatrician regarding the use of this medicine in children. Special care may be needed. Overdosage: If you think you have taken too much of this medicine contact a poison control center or emergency room at once. NOTE: This medicine is only for you. Do not share this medicine with others. What if I miss a dose? It is important not to miss your dose. Call your doctor or health care professional if you are unable to keep an appointment. What may interact with this medicine? -cyclosporine -medicines to increase blood counts like filgrastim, pegfilgrastim, sargramostim -vaccines This list may not describe all possible interactions. Give your health care provider a list of all the medicines, herbs, non-prescription drugs, or dietary supplements you use. Also tell them if you smoke, drink alcohol, or use illegal drugs. Some items may interact with your medicine. What should I watch for while using this medicine? Visit your doctor for checks on your progress. This drug may make you feel generally unwell. This is not uncommon, as chemotherapy  can affect healthy cells as well as cancer cells. Report any side effects. Continue your course of treatment even though you feel ill unless your doctor tells you to stop. In some cases, you may be given additional medicines to help with side effects. Follow all directions for their use. Call your doctor or health care professional for advice if you get a fever, chills or sore throat, or other symptoms of a cold or flu. Do not treat yourself. This drug decreases your body's ability to fight infections. Try to avoid being around people who are sick. This medicine may increase your risk to bruise or bleed. Call your doctor or health care professional if you notice any unusual bleeding. Be careful brushing and flossing your teeth or using a toothpick because you may get an infection or bleed more easily. If you have any dental work done, tell your dentist you are receiving this medicine. Avoid taking products that contain aspirin, acetaminophen, ibuprofen, naproxen, or ketoprofen unless instructed by your doctor. These medicines may hide a fever. Do not become pregnant while taking this medicine. Women should inform their doctor if they wish to become pregnant or think they might be pregnant. There is a potential for serious side effects to an unborn child. Talk to your health care professional or pharmacist for more information. Do not breast-feed an infant while taking this medicine. What side effects may I notice from receiving this medicine? Side effects that you should report to your doctor or health care professional as soon as possible: -allergic reactions like skin rash, itching or hives, swelling of the face, lips, or tongue -low blood counts - this medicine   may decrease the number of white blood cells, red blood cells and platelets. You may be at increased risk for infections and bleeding. -signs of infection - fever or chills, cough, sore throat, pain or difficulty passing urine -signs of  decreased platelets or bleeding - bruising, pinpoint red spots on the skin, black, tarry stools, blood in the urine -signs of decreased red blood cells - unusually weak or tired, fainting spells, lightheadedness -breathing problems -changes in vision -mouth or throat sores or ulcers -pain, redness, swelling or irritation at the injection site -pain, tingling, numbness in the hands or feet -redness, blistering, peeling or loosening of the skin, including inside the mouth -seizures -vomiting Side effects that usually do not require medical attention (report to your doctor or health care professional if they continue or are bothersome): -diarrhea -hair loss -loss of appetite -nausea -stomach pain This list may not describe all possible side effects. Call your doctor for medical advice about side effects. You may report side effects to FDA at 1-800-FDA-1088. Where should I keep my medicine? This drug is given in a hospital or clinic and will not be stored at home. NOTE: This sheet is a summary. It may not cover all possible information. If you have questions about this medicine, talk to your doctor, pharmacist, or health care provider.  2012, [employer]. (05/08/2007 5:24:12 PM) 

## 2012-10-14 ENCOUNTER — Ambulatory Visit (HOSPITAL_BASED_OUTPATIENT_CLINIC_OR_DEPARTMENT_OTHER): Payer: Medicare Other

## 2012-10-14 VITALS — BP 133/74 | HR 96

## 2012-10-14 DIAGNOSIS — C3490 Malignant neoplasm of unspecified part of unspecified bronchus or lung: Secondary | ICD-10-CM

## 2012-10-14 DIAGNOSIS — Z5189 Encounter for other specified aftercare: Secondary | ICD-10-CM

## 2012-10-14 MED ORDER — PEGFILGRASTIM INJECTION 6 MG/0.6ML
6.0000 mg | Freq: Once | SUBCUTANEOUS | Status: AC
  Administered 2012-10-14: 6 mg via SUBCUTANEOUS

## 2012-10-17 ENCOUNTER — Telehealth: Payer: Self-pay | Admitting: *Deleted

## 2012-10-17 ENCOUNTER — Other Ambulatory Visit: Payer: Self-pay | Admitting: Medical Oncology

## 2012-10-17 ENCOUNTER — Ambulatory Visit (HOSPITAL_BASED_OUTPATIENT_CLINIC_OR_DEPARTMENT_OTHER): Payer: Medicare Other

## 2012-10-17 VITALS — BP 126/68 | HR 88

## 2012-10-17 DIAGNOSIS — C3490 Malignant neoplasm of unspecified part of unspecified bronchus or lung: Secondary | ICD-10-CM

## 2012-10-17 DIAGNOSIS — C3400 Malignant neoplasm of unspecified main bronchus: Secondary | ICD-10-CM

## 2012-10-17 DIAGNOSIS — R110 Nausea: Secondary | ICD-10-CM

## 2012-10-17 MED ORDER — HEPARIN SOD (PORK) LOCK FLUSH 100 UNIT/ML IV SOLN
500.0000 [IU] | Freq: Once | INTRAVENOUS | Status: AC
  Administered 2012-10-17: 500 [IU] via INTRAVENOUS
  Filled 2012-10-17: qty 5

## 2012-10-17 MED ORDER — SODIUM CHLORIDE 0.9 % IV SOLN
INTRAVENOUS | Status: DC
  Administered 2012-10-17: 16:00:00 via INTRAVENOUS

## 2012-10-17 MED ORDER — SODIUM CHLORIDE 0.9 % IJ SOLN
10.0000 mL | INTRAMUSCULAR | Status: DC | PRN
  Administered 2012-10-17: 10 mL via INTRAVENOUS
  Filled 2012-10-17: qty 10

## 2012-10-17 NOTE — Telephone Encounter (Signed)
MY MOTHER IS HOME ALONE. SHE IS ALSO COMPLAINING OF DIZZINESS. DAUGHTER IS UNSURE HOW MUCH FLUIDS HER MOTHER IS DRINKING. COULD PT. COME IN FOR IV FLUIDS TODAY? A FRIEND IS ON THE WAY TO PT.'S HOUSE AND COULD BRING PT. AT ANY TIME. VERBAL ORDER AND READ BACK TO ADRENA JOHNSON,APP,PA- PT. MAY COME IN FOR IV FLUIDS TODAY. CHECKED WITH STEPHANIE JOHNSON,RN/CHARGE NURSE IN INFUSION. PT. TO COME FOR FLUIDS AT 3:15PM. NOTIFIED PT.'S DAUGHTER AND PT. FRIEND IS WITH PT. NOW.

## 2012-10-17 NOTE — Patient Instructions (Signed)
Dehydration, Adult Dehydration is when you lose more fluids from the body than you take in. Vital organs like the kidneys, brain, and heart cannot function without a proper amount of fluids and salt. Any loss of fluids from the body can cause dehydration.  CAUSES   Vomiting.  Diarrhea.  Excessive sweating.  Excessive urine output.  Fever. SYMPTOMS  Mild dehydration  Thirst.  Dry lips.  Slightly dry mouth. Moderate dehydration  Very dry mouth.  Sunken eyes.  Skin does not bounce back quickly when lightly pinched and released.  Dark urine and decreased urine production.  Decreased tear production.  Headache. Severe dehydration  Very dry mouth.  Extreme thirst.  Rapid, weak pulse (more than 100 beats per minute at rest).  Cold hands and feet.  Not able to sweat in spite of heat and temperature.  Rapid breathing.  Blue lips.  Confusion and lethargy.  Difficulty being awakened.  Minimal urine production.  No tears. DIAGNOSIS  Your caregiver will diagnose dehydration based on your symptoms and your exam. Blood and urine tests will help confirm the diagnosis. The diagnostic evaluation should also identify the cause of dehydration. TREATMENT  Treatment of mild or moderate dehydration can often be done at home by increasing the amount of fluids that you drink. It is best to drink small amounts of fluid more often. Drinking too much at one time can make vomiting worse. Refer to the home care instructions below. Severe dehydration needs to be treated at the hospital where you will probably be given intravenous (IV) fluids that contain water and electrolytes. HOME CARE INSTRUCTIONS   Ask your caregiver about specific rehydration instructions.  Drink enough fluids to keep your urine clear or pale yellow.  Drink small amounts frequently if you have nausea and vomiting.  Eat as you normally do.  Avoid:  Foods or drinks high in sugar.  Carbonated  drinks.  Juice.  Extremely hot or cold fluids.  Drinks with caffeine.  Fatty, greasy foods.  Alcohol.  Tobacco.  Overeating.  Gelatin desserts.  Wash your hands well to avoid spreading bacteria and viruses.  Only take over-the-counter or prescription medicines for pain, discomfort, or fever as directed by your caregiver.  Ask your caregiver if you should continue all prescribed and over-the-counter medicines.  Keep all follow-up appointments with your caregiver. SEEK MEDICAL CARE IF:  You have abdominal pain and it increases or stays in one area (localizes).  You have a rash, stiff neck, or severe headache.  You are irritable, sleepy, or difficult to awaken.  You are weak, dizzy, or extremely thirsty. SEEK IMMEDIATE MEDICAL CARE IF:   You are unable to keep fluids down or you get worse despite treatment.  You have frequent episodes of vomiting or diarrhea.  You have blood or green matter (bile) in your vomit.  You have blood in your stool or your stool looks black and tarry.  You have not urinated in 6 to 8 hours, or you have only urinated a small amount of very dark urine.  You have a fever.  You faint. MAKE SURE YOU:   Understand these instructions.  Will watch your condition.  Will get help right away if you are not doing well or get worse. Document Released: 01/04/2005 Document Revised: 03/29/2011 Document Reviewed: 08/24/2010 [employer] Patient Information 2014 [employer].  

## 2012-10-18 ENCOUNTER — Other Ambulatory Visit (HOSPITAL_BASED_OUTPATIENT_CLINIC_OR_DEPARTMENT_OTHER): Payer: Medicare Other | Admitting: Lab

## 2012-10-18 DIAGNOSIS — C3490 Malignant neoplasm of unspecified part of unspecified bronchus or lung: Secondary | ICD-10-CM

## 2012-10-18 DIAGNOSIS — C3400 Malignant neoplasm of unspecified main bronchus: Secondary | ICD-10-CM

## 2012-10-18 LAB — COMPREHENSIVE METABOLIC PANEL (CC13)
AST: 16 U/L (ref 5–34)
Albumin: 3.5 g/dL (ref 3.5–5.0)
BUN: 12.9 mg/dL (ref 7.0–26.0)
Calcium: 9.1 mg/dL (ref 8.4–10.4)
Chloride: 99 mEq/L (ref 98–109)
Creatinine: 0.9 mg/dL (ref 0.6–1.1)
Glucose: 117 mg/dl (ref 70–140)
Potassium: 3.8 mEq/L (ref 3.5–5.1)
Total Bilirubin: 0.98 mg/dL (ref 0.20–1.20)

## 2012-10-18 LAB — CBC WITH DIFFERENTIAL/PLATELET
Basophils Absolute: 0 10*3/uL (ref 0.0–0.1)
EOS%: 0.4 % (ref 0.0–7.0)
Eosinophils Absolute: 0.1 10*3/uL (ref 0.0–0.5)
HCT: 33.9 % — ABNORMAL LOW (ref 34.8–46.6)
HGB: 11.1 g/dL — ABNORMAL LOW (ref 11.6–15.9)
LYMPH%: 4 % — ABNORMAL LOW (ref 14.0–49.7)
MCH: 28.6 pg (ref 25.1–34.0)
MCHC: 32.9 g/dL (ref 31.5–36.0)
MCV: 87 fL (ref 79.5–101.0)
MONO%: 2.2 % (ref 0.0–14.0)
NEUT%: 93.1 % — ABNORMAL HIGH (ref 38.4–76.8)
Platelets: 156 10*3/uL (ref 145–400)
lymph#: 0.5 10*3/uL — ABNORMAL LOW (ref 0.9–3.3)

## 2012-10-25 ENCOUNTER — Other Ambulatory Visit (HOSPITAL_BASED_OUTPATIENT_CLINIC_OR_DEPARTMENT_OTHER): Payer: Medicare Other

## 2012-10-25 DIAGNOSIS — C3400 Malignant neoplasm of unspecified main bronchus: Secondary | ICD-10-CM

## 2012-10-25 DIAGNOSIS — C3490 Malignant neoplasm of unspecified part of unspecified bronchus or lung: Secondary | ICD-10-CM

## 2012-10-25 LAB — CBC WITH DIFFERENTIAL/PLATELET
Basophils Absolute: 0 10*3/uL (ref 0.0–0.1)
Eosinophils Absolute: 0 10*3/uL (ref 0.0–0.5)
HCT: 34.6 % — ABNORMAL LOW (ref 34.8–46.6)
HGB: 11.5 g/dL — ABNORMAL LOW (ref 11.6–15.9)
MCH: 28.8 pg (ref 25.1–34.0)
MONO#: 0.9 10*3/uL (ref 0.1–0.9)
MONO%: 8.6 % (ref 0.0–14.0)
NEUT#: 8.9 10*3/uL — ABNORMAL HIGH (ref 1.5–6.5)
NEUT%: 84 % — ABNORMAL HIGH (ref 38.4–76.8)
RDW: 18.3 % — ABNORMAL HIGH (ref 11.2–14.5)
lymph#: 0.7 10*3/uL — ABNORMAL LOW (ref 0.9–3.3)
nRBC: 0 % (ref 0–0)

## 2012-10-25 LAB — COMPREHENSIVE METABOLIC PANEL (CC13)
ALT: 13 U/L (ref 0–55)
AST: 13 U/L (ref 5–34)
Albumin: 3.7 g/dL (ref 3.5–5.0)
BUN: 11 mg/dL (ref 7.0–26.0)
CO2: 27 mEq/L (ref 22–29)
Calcium: 9.4 mg/dL (ref 8.4–10.4)
Chloride: 99 mEq/L (ref 98–109)
Glucose: 90 mg/dl (ref 70–140)
Potassium: 3.9 mEq/L (ref 3.5–5.1)
Sodium: 135 mEq/L — ABNORMAL LOW (ref 136–145)
Total Bilirubin: 0.23 mg/dL (ref 0.20–1.20)
Total Protein: 6.8 g/dL (ref 6.4–8.3)

## 2012-10-31 ENCOUNTER — Encounter (HOSPITAL_COMMUNITY): Payer: Self-pay | Admitting: Emergency Medicine

## 2012-10-31 ENCOUNTER — Ambulatory Visit (HOSPITAL_COMMUNITY): Payer: Medicare Other

## 2012-10-31 ENCOUNTER — Emergency Department (HOSPITAL_COMMUNITY): Payer: Medicare Other

## 2012-10-31 ENCOUNTER — Emergency Department (HOSPITAL_COMMUNITY)
Admission: EM | Admit: 2012-10-31 | Discharge: 2012-10-31 | Disposition: A | Discharge status: Home / Self Care | Payer: Medicare Other | Attending: Emergency Medicine | Admitting: Emergency Medicine

## 2012-10-31 ENCOUNTER — Telehealth: Payer: Self-pay | Admitting: *Deleted

## 2012-10-31 DIAGNOSIS — Z923 Personal history of irradiation: Secondary | ICD-10-CM | POA: Insufficient documentation

## 2012-10-31 DIAGNOSIS — C3490 Malignant neoplasm of unspecified part of unspecified bronchus or lung: Secondary | ICD-10-CM | POA: Insufficient documentation

## 2012-10-31 DIAGNOSIS — R531 Weakness: Secondary | ICD-10-CM

## 2012-10-31 DIAGNOSIS — Z9221 Personal history of antineoplastic chemotherapy: Secondary | ICD-10-CM | POA: Insufficient documentation

## 2012-10-31 DIAGNOSIS — C3492 Malignant neoplasm of unspecified part of left bronchus or lung: Secondary | ICD-10-CM

## 2012-10-31 DIAGNOSIS — R5381 Other malaise: Secondary | ICD-10-CM | POA: Insufficient documentation

## 2012-10-31 DIAGNOSIS — Z79899 Other long term (current) drug therapy: Secondary | ICD-10-CM | POA: Insufficient documentation

## 2012-10-31 DIAGNOSIS — Z87891 Personal history of nicotine dependence: Secondary | ICD-10-CM | POA: Insufficient documentation

## 2012-10-31 LAB — CBC WITH DIFFERENTIAL/PLATELET
Basophils Absolute: 0 10*3/uL (ref 0.0–0.1)
Basophils Relative: 0 % (ref 0–1)
Eosinophils Relative: 0 % (ref 0–5)
HCT: 34.8 % — ABNORMAL LOW (ref 36.0–46.0)
Lymphocytes Relative: 12 % (ref 12–46)
MCHC: 33.9 g/dL (ref 30.0–36.0)
MCV: 86.8 fL (ref 78.0–100.0)
Monocytes Absolute: 1.1 10*3/uL — ABNORMAL HIGH (ref 0.1–1.0)
Neutro Abs: 6.4 10*3/uL (ref 1.7–7.7)
Neutrophils Relative %: 75 % (ref 43–77)
Platelets: 333 10*3/uL (ref 150–400)
RDW: 17.8 % — ABNORMAL HIGH (ref 11.5–15.5)
WBC: 8.6 10*3/uL (ref 4.0–10.5)

## 2012-10-31 LAB — URINALYSIS, ROUTINE W REFLEX MICROSCOPIC
Bilirubin Urine: NEGATIVE
Ketones, ur: NEGATIVE mg/dL
Leukocytes, UA: NEGATIVE
Nitrite: NEGATIVE
Protein, ur: NEGATIVE mg/dL
Specific Gravity, Urine: 1.013 (ref 1.005–1.030)
Urobilinogen, UA: 0.2 mg/dL (ref 0.0–1.0)
pH: 5.5 (ref 5.0–8.0)

## 2012-10-31 LAB — BASIC METABOLIC PANEL
BUN: 14 mg/dL (ref 6–23)
Calcium: 9.7 mg/dL (ref 8.4–10.5)
Chloride: 91 mEq/L — ABNORMAL LOW (ref 96–112)
Creatinine, Ser: 0.91 mg/dL (ref 0.50–1.10)
GFR calc Af Amer: 71 mL/min — ABNORMAL LOW (ref >90)
GFR calc non Af Amer: 61 mL/min — ABNORMAL LOW (ref >90)
Sodium: 128 mEq/L — ABNORMAL LOW (ref 135–145)

## 2012-10-31 MED ORDER — ONDANSETRON HCL 4 MG/2ML IJ SOLN
4.0000 mg | Freq: Once | INTRAMUSCULAR | Status: AC
  Administered 2012-10-31: 4 mg via INTRAVENOUS
  Filled 2012-10-31: qty 2

## 2012-10-31 MED ORDER — SODIUM CHLORIDE 0.9 % IV BOLUS (SEPSIS)
1000.0000 mL | Freq: Once | INTRAVENOUS | Status: AC
  Administered 2012-10-31: 1000 mL via INTRAVENOUS

## 2012-10-31 MED ORDER — HEPARIN SOD (PORK) LOCK FLUSH 100 UNIT/ML IV SOLN
500.0000 [IU] | Freq: Once | INTRAVENOUS | Status: AC
  Administered 2012-10-31: 500 [IU]
  Filled 2012-10-31: qty 5

## 2012-10-31 MED ORDER — IOHEXOL 300 MG/ML  SOLN
100.0000 mL | Freq: Once | INTRAMUSCULAR | Status: AC | PRN
  Administered 2012-10-31: 100 mL via INTRAVENOUS

## 2012-10-31 MED ORDER — IOHEXOL 300 MG/ML  SOLN
50.0000 mL | Freq: Once | INTRAMUSCULAR | Status: AC | PRN
  Administered 2012-10-31: 50 mL via ORAL

## 2012-10-31 NOTE — ED Notes (Signed)
Patient reports that she has been having weakness x 4 weeks. Patient reports that she has been receiving chemo and radiation. Patient reports that she has been receiving fluid and nothing has helped. Patient has a history of lung cancer.

## 2012-10-31 NOTE — ED Notes (Addendum)
Pt report that she was supposed to have a chest CT at 1500, but has been increasingly weak and Muhammad MD directed her to come in for evaluation.  Hx of lung CA.

## 2012-10-31 NOTE — ED Provider Notes (Signed)
CSN: [id number]     Arrival date & time 10/31/12  1304 History   First MD Initiated Contact with Patient 10/31/12 1331     Chief Complaint  Patient presents with  . 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 She has received both radiation and chemotherapy. Last chemotherapy approximately 3 weeks ago. She feels very weak. No energy. She has lost 17 pounds past couple months. No specific complaints of chest pain, dyspnea, fever, chills, dysuria  Past Medical History  Diagnosis Date  . Pneumonia, organism unspecified   . Lung cancer 07/05/12    Left Mainstem Bronchus- Small Cell Carcinoma   Past Surgical History  Procedure Laterality Date  . Nasal sinus surgery    . Video bronchoscopy Bilateral 07/05/2012    Procedure: VIDEO BRONCHOSCOPY WITHOUT FLUORO;  Surgeon: Nyoka Cowden, MD;  Location: [facility];  Service: Cardiopulmonary;  Laterality: Bilateral;  . Portacath placement     Family History  Problem Relation Age of Onset  . Heart disease Mother   . Heart disease Father     PVD  . Prostate cancer Father   . Lung cancer Mother 40    was a smoker  . Colon cancer Brother    History  Substance Use Topics  . Smoking status: Former Smoker -- 1.00 packs/day for 40 years    Types: Cigarettes    Quit date: 04/18/2012  . Smokeless tobacco: Never Used  . Alcohol Use: No   OB History   Grav Para Term Preterm Abortions TAB SAB Ect Mult Living                 Review of Systems  All other systems reviewed and are negative.    Allergies  Asa; Codeine; and Tramadol  Home Medications   Current Outpatient Rx  Name  Route  Sig  Dispense  Refill  . acetaminophen (TYLENOL) 500 MG tablet   Oral   Take 1,000 mg by mouth every 6 (six) hours as needed (For headache.).         Marland Kitchen esomeprazole (NEXIUM) 10 MG packet   Oral  Take 10 mg by mouth daily before breakfast.         . glucosamine-chondroitin 500-400 MG tablet   Oral   Take 1 tablet by mouth daily with lunch.          Marland Kitchen HYDROcodone-acetaminophen (NORCO/VICODIN) 5-325 MG per tablet   Oral   Take 1 tablet by mouth every 6 (six) hours as needed for pain.   10 tablet   0   . lidocaine-prilocaine (EMLA) cream   Topical   Apply 1 application topically daily as needed (Applies to port-a-cath.).   30 g   0   . Multiple Vitamin (MULTIVITAMIN WITH MINERALS) TABS   Oral   Take 1 tablet by mouth daily with lunch. She takes Surveyor, quantity Adult 50+.         . prochlorperazine (COMPAZINE) 10 MG tablet      TAKE 1 TABLET BY MOUTH EVERY 6 HOURS AS NEEDED   60 tablet   0    BP 142/62  Pulse 72  Temp(Src) 98.1 F (36.7 C) (Oral)  Resp 18  Ht 5' 2.5" (1.588 m)  Wt 106 lb (48.081 kg)  BMI 19.07 kg/m2  SpO2 99% Physical Exam  Nursing note and vitals reviewed. Constitutional: She is oriented to person, place, and  time. She appears well-developed and well-nourished.  No acute distress.  HENT:  Head: Normocephalic and atraumatic.  Eyes: Conjunctivae and EOM are normal. Pupils are equal, round, and reactive to light.  Neck: Normal range of motion. Neck supple.  Cardiovascular: Normal rate, regular rhythm and normal heart sounds.   Pulmonary/Chest: Effort normal and breath sounds normal.  Abdominal: Soft. Bowel sounds are normal.  Musculoskeletal: Normal range of motion.  Neurological: She is alert and oriented to person, place, and time.  Skin: Skin is warm and dry.  Psychiatric: She has a normal mood and affect.    ED Course  Procedures (including critical care time) Labs Review Labs Reviewed  BASIC METABOLIC PANEL - Abnormal; Notable for the following:    Sodium 128 (*)    Chloride 91 (*)    GFR calc non Af Amer 61 (*)    GFR calc Af Amer 71 (*)    All other components within normal limits  CBC WITH DIFFERENTIAL - Abnormal;  Notable for the following:    Hemoglobin 11.8 (*)    HCT 34.8 (*)    RDW 17.8 (*)    Monocytes Relative 13 (*)    Monocytes Absolute 1.1 (*)    All other components within normal limits  URINALYSIS, ROUTINE W REFLEX MICROSCOPIC   Imaging Review No results found.  EKG Interpretation   None       MDM  No diagnosis found. Patient was scheduled for CT scan of abdomen, pelvis, chest today.   These were ordered along with screening blood work.  Discussed with Dr. Juleen China who will f/u with test results    Donnetta Hutching, MD 10/31/12 1550

## 2012-10-31 NOTE — ED Notes (Signed)
Patient transported to CT 

## 2012-10-31 NOTE — Telephone Encounter (Signed)
SINCE 10/19/12 PT. IS STAYING MOSTLY IN BED. SHE IS NOT EATING OR DRINKING. PT. IS TOO WEAK TO DO ANYTHING. COULD PT. BE SEEN TODAY AFTER THE CT SCAN OR SHOULD PT. BE TAKEN TO THE EMERGENCY ROOM? VERBAL ORDER AND READ BACK TO DR.MOHAMED- PT. TO GO TO THE EMERGENCY ROOM. SHE COULD HAVE CT SCAN DONE THERE. NOTIFIED PT.'S DAUGHTER. SHE VOICES UNDERSTANDING.

## 2012-10-31 NOTE — ED Notes (Signed)
Pt aware of the need for a urine sample. 

## 2012-11-01 ENCOUNTER — Ambulatory Visit: Payer: Medicare Other

## 2012-11-01 ENCOUNTER — Ambulatory Visit (HOSPITAL_BASED_OUTPATIENT_CLINIC_OR_DEPARTMENT_OTHER): Payer: Medicare Other | Admitting: Internal Medicine

## 2012-11-01 ENCOUNTER — Telehealth: Payer: Self-pay | Admitting: Internal Medicine

## 2012-11-01 ENCOUNTER — Encounter: Payer: Self-pay | Admitting: Internal Medicine

## 2012-11-01 ENCOUNTER — Other Ambulatory Visit: Payer: Medicare Other | Admitting: Lab

## 2012-11-01 VITALS — BP 106/76 | HR 104 | Temp 97.3°F | Resp 20 | Ht 62.5 in | Wt 107.7 lb

## 2012-11-01 DIAGNOSIS — C3492 Malignant neoplasm of unspecified part of left bronchus or lung: Secondary | ICD-10-CM

## 2012-11-01 DIAGNOSIS — C7931 Secondary malignant neoplasm of brain: Secondary | ICD-10-CM

## 2012-11-01 DIAGNOSIS — C7970 Secondary malignant neoplasm of unspecified adrenal gland: Secondary | ICD-10-CM

## 2012-11-01 DIAGNOSIS — C787 Secondary malignant neoplasm of liver and intrahepatic bile duct: Secondary | ICD-10-CM

## 2012-11-01 DIAGNOSIS — C3490 Malignant neoplasm of unspecified part of unspecified bronchus or lung: Secondary | ICD-10-CM

## 2012-11-01 NOTE — Telephone Encounter (Signed)
[facility] AND PRINTED [facility]PT SCHED NAD AVS FOR PT FOR oct AND NOV. PT WANTS WATER BASED.

## 2012-11-01 NOTE — Progress Notes (Signed)
[facility] [phone]   Fax:([phone]) [phone]  OFFICE PROGRESS NOTE   DIAGNOSIS AND STAGE: Extensive stage small cell lung cancer with large obstructing Center left lung mass with large left pleural effusion, liver and brain metastasis diagnosed in June of 2014   PRIOR THERAPY:  1) Status post whole brain irradiation under the care of Dr. Basilio Cairo completed 09/11/2012.  2) whole brain irradiation under the care of Dr. Basilio Cairo expected to be completed on 09/11/2012.  3) Systemic chemotherapy with carboplatin for AUC of 5 on day 1 and etoposide 120 mg/M2 on days 1, 2 and 3 with Neulasta support on day 4, status post 4 cycles, last cycle was given on 10/11/2012 with partial response.   CURRENT THERAPY:  None  CHEMOTHERAPY INTENT: Palliative  CURRENT # OF CHEMOTHERAPY CYCLES: 0 CURRENT ANTIEMETICS: Zofran, dexamethasone and Compazine  CURRENT SMOKING STATUS: Former smoker  ORAL CHEMOTHERAPY AND CONSENT: None  CURRENT BISPHOSPHONATES USE: None  PAIN MANAGEMENT: 0/10 on Vicodin  NARCOTICS INDUCED CONSTIPATION: None  LIVING WILL AND CODE STATUS: no CODE BLUE   INTERVAL HISTORY: Cheryl Nielsen 72 y.o. female returns to the clinic today for follow up visit accompanied by her daughter. The patient was seen at the emergency Department yesterday complaining of increasing fatigue and weakness as well as dehydration. She received IV hydration during her visit to the emergency department. She feels a little bit better today. She denied having any significant fever or chills. She denied having any nausea or vomiting. The patient denied having any significant chest pain but continues to have shortness of breath with exertion with no cough or hemoptysis. She tolerated the last cycle of her systemic chemotherapy fairly well except for the increasing fatigue. She had repeat CT scan of the chest, abdomen and pelvis performed yesterday and she is here for evaluation and discussion of  her scan results.  MEDICAL HISTORY: Past Medical History  Diagnosis Date  . Pneumonia, organism unspecified   . Lung cancer 07/05/12    Left Mainstem Bronchus- Small Cell Carcinoma    ALLERGIES:  is allergic to asa; codeine; and tramadol.  MEDICATIONS:  Current Outpatient Prescriptions  Medication Sig Dispense Refill  . acetaminophen (TYLENOL) 500 MG tablet Take 1,000 mg by mouth every 6 (six) hours as needed (For headache.).      Marland Kitchen esomeprazole (NEXIUM) 10 MG packet Take 10 mg by mouth daily before breakfast.      . glucosamine-chondroitin 500-400 MG tablet Take 1 tablet by mouth daily with lunch.       Marland Kitchen HYDROcodone-acetaminophen (NORCO/VICODIN) 5-325 MG per tablet Take 1 tablet by mouth every 6 (six) hours as needed for pain.  10 tablet  0  . lidocaine-prilocaine (EMLA) cream Apply 1 application topically daily as needed (Applies to port-a-cath.).  30 g  0  . Multiple Vitamin (MULTIVITAMIN WITH MINERALS) TABS Take 1 tablet by mouth daily with lunch. She takes Surveyor, quantity Adult 50+.      . prochlorperazine (COMPAZINE) 10 MG tablet TAKE 1 TABLET BY MOUTH EVERY 6 HOURS AS NEEDED  60 tablet  0   No current facility-administered medications for this visit.    SURGICAL HISTORY:  Past Surgical History  Procedure Laterality Date  . Nasal sinus surgery    . Video bronchoscopy Bilateral 07/05/2012    Procedure: VIDEO BRONCHOSCOPY WITHOUT FLUORO;  Surgeon: Nyoka Cowden, MD;  Location: [facility];  Service: Cardiopulmonary;  Laterality: Bilateral;  . Portacath placement  REVIEW OF SYSTEMS:  Constitutional: positive for anorexia, fatigue and weight loss Eyes: negative Ears, nose, mouth, throat, and face: negative Respiratory: positive for dyspnea on exertion Cardiovascular: negative Gastrointestinal: negative Genitourinary:negative Integument/breast: negative Hematologic/lymphatic: negative Musculoskeletal:negative Neurological: negative Behavioral/Psych:  negative Endocrine: negative Allergic/Immunologic: negative   PHYSICAL EXAMINATION: General appearance: alert, cooperative, fatigued and no distress Head: Normocephalic, without obvious abnormality, atraumatic Neck: no adenopathy, no JVD, supple, symmetrical, trachea midline and thyroid not enlarged, symmetric, no tenderness/mass/nodules Lymph nodes: Cervical, supraclavicular, and axillary nodes normal. Resp: clear to auscultation bilaterally Back: symmetric, no curvature. ROM normal. No CVA tenderness. Cardio: regular rate and rhythm, S1, S2 normal, no murmur, click, rub or gallop GI: soft, non-tender; bowel sounds normal; no masses,  no organomegaly Extremities: extremities normal, atraumatic, no cyanosis or edema Neurologic: Alert and oriented X 3, normal strength and tone. Normal symmetric reflexes. Normal coordination and gait  ECOG PERFORMANCE STATUS: 1 - Symptomatic but completely ambulatory  There were no vitals taken for this visit.  LABORATORY DATA: Lab Results  Component Value Date   WBC 8.6 10/31/2012   HGB 11.8* 10/31/2012   HCT 34.8* 10/31/2012   MCV 86.8 10/31/2012   PLT 333 10/31/2012      Chemistry      Component Value Date/Time   NA 128* 10/31/2012 1445   NA 135* 10/25/2012 0922   K 3.9 10/31/2012 1445   K 3.9 10/25/2012 0922   CL 91* 10/31/2012 1445   CL 93* 07/10/2012 1544   CO2 25 10/31/2012 1445   CO2 27 10/25/2012 0922   BUN 14 10/31/2012 1445   BUN 11.0 10/25/2012 0922   CREATININE 0.91 10/31/2012 1445   CREATININE 1.0 10/25/2012 0922      Component Value Date/Time   CALCIUM 9.7 10/31/2012 1445   CALCIUM 9.4 10/25/2012 0922   ALKPHOS 109 10/25/2012 0922   AST 13 10/25/2012 0922   ALT 13 10/25/2012 0922   BILITOT 0.23 10/25/2012 0922       RADIOGRAPHIC STUDIES: Ct Chest W Contrast  10/31/2012   CLINICAL DATA:  Weakness. Recent chemotherapy for lung cancer. Staging.  EXAM: CT CHEST, ABDOMEN, AND PELVIS WITH CONTRAST  TECHNIQUE: Multidetector CT  imaging of the chest, abdomen and pelvis was performed following the standard protocol during bolus administration of intravenous contrast.  CONTRAST:  OMNIPAQUE IOHEXOL 300 MG/ML  SOLN  COMPARISON:  08/28/2012.  FINDINGS: CT CHEST FINDINGS  Right anterior chest wall port. No supraclavicular lymphadenopathy. Bulky mediastinal adenopathy shows moderate improvement. Prevascular lymph node now no longer visualized as compared with 5 mm previously. 7 mm subcarinal node previously now 5 mm. Persistent left hilar mass diminished in size measuring 32 x 37 mm. Small left effusion. Re-expansion left upper lobe. No other interval findings.  CT ABDOMEN AND PELVIS FINDINGS  Continued improvement prior hepatic metastatic disease. Index lesion referenced previously in the inferior medial right hepatic lobe measuring 7 mm is in bilateral adrenal metastases are decreased also measuring 12 x 24 on the right is compared with 15 x 27 previously. Unremarkable small bowel, stomach, and colon. Normal caliber aorta. No retroperitoneal or periportal adenopathy. No mesenteric disease. Normal bladder. No obstructive uropathy. Moderate feces.  Sclerotic lesions are re-demonstrated in the thoracic and lumbar vertebrae. These appear somewhat more sclerotic than previously although not clearly worse. These include T8, L3, L4, sacrum, and L1. These are consistent with treated metastases. No intraspinal lesions are evident.  IMPRESSION: Improved left hilar primary and improved mediastinal adenopathy. Decreased left effusion. No  new lesions.  Improved abdominal metastatic disease including decreased hepatic metastases, reduction of bilateral adrenal metastases as described above. Sclerotic lesions are re- demonstrated and slightly more dense, but not clearly enlarged, likely response to treatment.   Electronically Signed   By: Davonna Belling M.D.   On: 10/31/2012 17:17   Ct Abdomen Pelvis W Contrast  10/31/2012   CLINICAL DATA:  Weakness.  Recent chemotherapy for lung cancer. Staging.  EXAM: CT CHEST, ABDOMEN, AND PELVIS WITH CONTRAST  TECHNIQUE: Multidetector CT imaging of the chest, abdomen and pelvis was performed following the standard protocol during bolus administration of intravenous contrast.  CONTRAST:  OMNIPAQUE IOHEXOL 300 MG/ML  SOLN  COMPARISON:  08/28/2012.  FINDINGS: CT CHEST FINDINGS  Right anterior chest wall port. No supraclavicular lymphadenopathy. Bulky mediastinal adenopathy shows moderate improvement. Prevascular lymph node now no longer visualized as compared with 5 mm previously. 7 mm subcarinal node previously now 5 mm. Persistent left hilar mass diminished in size measuring 32 x 37 mm. Small left effusion. Re-expansion left upper lobe. No other interval findings.  CT ABDOMEN AND PELVIS FINDINGS  Continued improvement prior hepatic metastatic disease. Index lesion referenced previously in the inferior medial right hepatic lobe measuring 7 mm is in bilateral adrenal metastases are decreased also measuring 12 x 24 on the right is compared with 15 x 27 previously. Unremarkable small bowel, stomach, and colon. Normal caliber aorta. No retroperitoneal or periportal adenopathy. No mesenteric disease. Normal bladder. No obstructive uropathy. Moderate feces.  Sclerotic lesions are re-demonstrated in the thoracic and lumbar vertebrae. These appear somewhat more sclerotic than previously although not clearly worse. These include T8, L3, L4, sacrum, and L1. These are consistent with treated metastases. No intraspinal lesions are evident.  IMPRESSION: Improved left hilar primary and improved mediastinal adenopathy. Decreased left effusion. No new lesions.  Improved abdominal metastatic disease including decreased hepatic metastases, reduction of bilateral adrenal metastases as described above. Sclerotic lesions are re- demonstrated and slightly more dense, but not clearly enlarged, likely response to treatment.   Electronically  Signed   By: Davonna Belling M.D.   On: 10/31/2012 17:17    ASSESSMENT AND PLAN: this is a very pleasant 72 years old white female with extensive stage small cell lung cancer status post 4 cycles of systemic chemotherapy with carboplatin and etoposide with significant improvement in her disease. I discussed the scan results and showed the images to the patient and her daughter. I recommended for her to discontinue the treatment at this point and continue on observation because she has good response to the treatment but significant adverse effect especially fatigue, weakness and dehydration. I will arrange for the patient to come back for follow up visit in 2 months with repeat CT scan of the chest, abdomen and pelvis for restaging of her disease I encouraged the patient to increase her by mouth intake. She was advised to call immediately if she has any concerning symptoms in the interval.  The patient voices understanding of current disease status and treatment options and is in agreement with the current care plan.  All questions were answered. The patient knows to call the clinic with any problems, questions or concerns. We can certainly see the patient much sooner if necessary.  I spent 15 minutes counseling the patient face to face. The total time spent in the appointment was 25 minutes.

## 2012-11-01 NOTE — Patient Instructions (Signed)
followup visit in 2 months with repeat CT scan of the chest, abdomen and pelvis.

## 2012-11-02 ENCOUNTER — Ambulatory Visit: Payer: Medicare Other

## 2012-11-03 ENCOUNTER — Ambulatory Visit: Payer: Medicare Other

## 2012-11-04 ENCOUNTER — Ambulatory Visit: Payer: Medicare Other

## 2012-11-08 ENCOUNTER — Other Ambulatory Visit: Payer: Medicare Other

## 2012-11-13 ENCOUNTER — Encounter: Payer: Self-pay | Admitting: Specialist

## 2012-11-13 ENCOUNTER — Other Ambulatory Visit: Payer: Medicare Other

## 2012-11-13 NOTE — Progress Notes (Signed)
The patient's daughter, Andreas Blower, called this morning to inquire about the availability of a palliative consult.  Karoline Caldwell is a Engineer, civil (consulting) at [facility] [employer] and has seen patients take advantage of that service there.  She has observed that her mother is much more fatigued, has had to stop work (which is a genuine source of joy and social connection for her mother), has expressed wishing to discontinue chemotherapy, and has made comments regarding her life expectancy.  Ms. Reed Pandy feels that her mother could benefit from talking with someone about quality of life issues and her understanding of her prognosis.  [name] went on to say that she feels it would be more helpful if someone other than the patient's children could have that conversation with Okey Regal.  I told [name] that I would check into services that would support her mother and call her back to let her know what would be available.

## 2012-11-15 ENCOUNTER — Ambulatory Visit
Admission: RE | Admit: 2012-11-15 | Discharge: 2012-11-15 | Disposition: A | Discharge status: Home / Self Care | Payer: Medicare Other | Source: Ambulatory Visit | Attending: Radiation Oncology | Admitting: Radiation Oncology

## 2012-11-15 DIAGNOSIS — C7931 Secondary malignant neoplasm of brain: Secondary | ICD-10-CM

## 2012-11-15 MED ORDER — GADOBENATE DIMEGLUMINE 529 MG/ML IV SOLN
10.0000 mL | Freq: Once | INTRAVENOUS | Status: AC | PRN
  Administered 2012-11-15: 10 mL via INTRAVENOUS

## 2012-11-16 ENCOUNTER — Encounter: Payer: Self-pay | Admitting: Radiation Oncology

## 2012-11-17 ENCOUNTER — Ambulatory Visit
Admission: RE | Admit: 2012-11-17 | Discharge: 2012-11-17 | Disposition: A | Discharge status: Home / Self Care | Payer: Medicare Other | Source: Ambulatory Visit | Attending: Radiation Oncology | Admitting: Radiation Oncology

## 2012-11-17 ENCOUNTER — Encounter: Payer: Self-pay | Admitting: Radiation Oncology

## 2012-11-17 VITALS — BP 71/56 | HR 125 | Temp 97.5°F | Ht 62.5 in | Wt 105.8 lb

## 2012-11-17 DIAGNOSIS — C7931 Secondary malignant neoplasm of brain: Secondary | ICD-10-CM

## 2012-11-17 DIAGNOSIS — R270 Ataxia, unspecified: Secondary | ICD-10-CM

## 2012-11-17 DIAGNOSIS — C3490 Malignant neoplasm of unspecified part of unspecified bronchus or lung: Secondary | ICD-10-CM

## 2012-11-17 DIAGNOSIS — D649 Anemia, unspecified: Secondary | ICD-10-CM

## 2012-11-17 DIAGNOSIS — R5381 Other malaise: Secondary | ICD-10-CM

## 2012-11-17 DIAGNOSIS — I951 Orthostatic hypotension: Secondary | ICD-10-CM

## 2012-11-17 DIAGNOSIS — R42 Dizziness and giddiness: Secondary | ICD-10-CM

## 2012-11-17 HISTORY — DX: Personal history of antineoplastic chemotherapy: Z92.21

## 2012-11-17 HISTORY — DX: Personal history of irradiation: Z92.3

## 2012-11-17 LAB — BASIC METABOLIC PANEL
BUN: 16 mg/dL (ref 6–23)
Calcium: 9.9 mg/dL (ref 8.4–10.5)
Creatinine, Ser: 1.05 mg/dL (ref 0.50–1.10)

## 2012-11-17 NOTE — Progress Notes (Signed)
Cheryl Nielsen here today for assessment s/p radiation for brain mets.  Her gait is unsteady and she demonstrates hypotension with an elevated pulse when vitals assessed from sitting to standing. She reports blurred, and intermittent nausea, but denies any difficulty with fine motor movement, or headaches.   She denies any pain presently. She appears fatigued today.

## 2012-11-17 NOTE — Progress Notes (Signed)
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 Their main concern is recurrent orthostatic hypotension.  This has been discussed with her PCP, but the etiology is unclear.  She is drinking at least 48 oz of non caffeinated drinks daily.  Urinates regularly and urine is light yellow. Her gait is unsteady and she demonstrates hypotension with an elevated pulse when vitals assessed from sitting to standing. She reports blurred, and intermittent nausea, but denies any difficulty with fine motor movement, or headaches. She takes a compazine in the AM for nausea. She denies any pain presently. She stopped her pain meds.  She is taking a break from chemotherapy.                             ALLERGIES:  is allergic to asa; codeine; and tramadol.  Meds: Current Outpatient Prescriptions  Medication Sig Dispense Refill  . esomeprazole (NEXIUM) 10 MG packet Take 10 mg by mouth daily before breakfast.      . glucosamine-chondroitin 500-400 MG tablet Take 1 tablet by mouth daily with lunch.       . lidocaine-prilocaine (EMLA) cream Apply 1 application topically daily as needed (Applies to port-a-cath.).  30 g  0  . Multiple Vitamin (MULTIVITAMIN WITH MINERALS) TABS Take 1 tablet by mouth daily with lunch. She takes Surveyor, quantity Adult 50+.      . prochlorperazine (COMPAZINE) 10 MG tablet TAKE 1 TABLET BY MOUTH EVERY 6 HOURS AS NEEDED  60 tablet  0  .  acetaminophen (TYLENOL) 500 MG tablet Take 1,000 mg by mouth every 6 (six) hours as needed (For headache.).       No current facility-administered medications for this encounter.    Physical Findings: The patient is in no acute distress. Patient is alert and oriented.   height is 5' 2.5" (1.588 m) and weight is 105 lb 12.8 oz (47.991 kg). Her temperature is 97.5 F (36.4 C). Her blood pressure is 71/56 and her pulse is 125.  standing; sitting it was 101/69 and pulse 116.  General: Alert and oriented, in no acute distress HEENT: Head is normocephalic. Pupils are equally round and reactive to light. Extraocular movements are intact. Oropharynx is clear and moist. Neck: Neck is supple, no palpable cervical or supraclavicular lymphadenopathy. Heart: Regular in rate and rhythm with no murmurs, rubs, or gallops. Chest: Clear to auscultation bilaterally, with no rhonchi, wheezes, or rales. Abdomen: Soft, nontender, nondistended, with no rigidity or guarding. Extremities: No cyanosis or edema. Skin: No concerning lesions. Musculoskeletal: symmetric strength and muscle tone throughout. Neurologic: Cranial nerves II through XII are grossly intact. No obvious focalities. Speech is fluent. Coordination is intact. Psychiatric: Judgment and insight are intact. Affect is appropriate.   Lab Findings:  CMP     Component Value Date/Time   NA 132* 11/17/2012 1228   NA 135* 10/25/2012 0922   K 4.6  11/17/2012 1228   K 3.9 10/25/2012 0922   CL 96 11/17/2012 1228   CL 93* 07/10/2012 1544   CO2 26 11/17/2012 1228   CO2 27 10/25/2012 0922   GLUCOSE 91 11/17/2012 1228   GLUCOSE 90 10/25/2012 0922   GLUCOSE 134* 07/10/2012 1544   BUN 16 11/17/2012 1228   BUN 11.0 10/25/2012 0922   CREATININE 1.05 11/17/2012 1228   CREATININE 1.0 10/25/2012 0922   CALCIUM 9.9 11/17/2012 1228   CALCIUM 9.4 10/25/2012 0922   PROT 6.8 10/25/2012 0922   ALBUMIN 3.7 10/25/2012 0922   AST 13 10/25/2012 0922   ALT 13 10/25/2012  0922   ALKPHOS 109 10/25/2012 0922   BILITOT 0.23 10/25/2012 0922   GFRNONAA 61* 10/31/2012 1445   GFRAA 71* 10/31/2012 1445    Lab Results  Component Value Date   WBC 8.6 10/31/2012   HGB 11.8* 10/31/2012   HCT 34.8* 10/31/2012   MCV 86.8 10/31/2012   PLT 333 10/31/2012   No results found for this basename: TSH   No results found for this basename: VITAMINB12      Radiographic Findings: Ct Chest W Contrast  10/31/2012   CLINICAL DATA:  Weakness. Recent chemotherapy for lung cancer. Staging.  EXAM: CT CHEST, ABDOMEN, AND PELVIS WITH CONTRAST  TECHNIQUE: Multidetector CT imaging of the chest, abdomen and pelvis was performed following the standard protocol during bolus administration of intravenous contrast.  CONTRAST:  OMNIPAQUE IOHEXOL 300 MG/ML  SOLN  COMPARISON:  08/28/2012.  FINDINGS: CT CHEST FINDINGS  Right anterior chest wall port. No supraclavicular lymphadenopathy. Bulky mediastinal adenopathy shows moderate improvement. Prevascular lymph node now no longer visualized as compared with 5 mm previously. 7 mm subcarinal node previously now 5 mm. Persistent left hilar mass diminished in size measuring 32 x 37 mm. Small left effusion. Re-expansion left upper lobe. No other interval findings.  CT ABDOMEN AND PELVIS FINDINGS  Continued improvement prior hepatic metastatic disease. Index lesion referenced previously in the inferior medial right hepatic lobe measuring 7 mm is in bilateral adrenal metastases are decreased also measuring 12 x 24 on the right is compared with 15 x 27 previously. Unremarkable small bowel, stomach, and colon. Normal caliber aorta. No retroperitoneal or periportal adenopathy. No mesenteric disease. Normal bladder. No obstructive uropathy. Moderate feces.  Sclerotic lesions are re-demonstrated in the thoracic and lumbar vertebrae. These appear somewhat more sclerotic than previously although not clearly worse. These include T8, L3, L4, sacrum, and L1. These are  consistent with treated metastases. No intraspinal lesions are evident.  IMPRESSION: Improved left hilar primary and improved mediastinal adenopathy. Decreased left effusion. No new lesions.  Improved abdominal metastatic disease including decreased hepatic metastases, reduction of bilateral adrenal metastases as described above. Sclerotic lesions are re- demonstrated and slightly more dense, but not clearly enlarged, likely response to treatment.   Electronically Signed   By: Davonna Belling M.D.   On: 10/31/2012 17:17   Mr Laqueta Jean ZO Contrast  11/15/2012   CLINICAL DATA:  Lung cancer. Status post whole brain radiation. FOLLOW UP.  EXAM: MRI HEAD WITHOUT AND WITH CONTRAST  TECHNIQUE: Multiplanar, multiecho pulse sequences of the brain and surrounding structures were obtained according to standard protocol without and with intravenous contrast  CONTRAST:  10mL MULTIHANCE GADOBENATE DIMEGLUMINE 529 MG/ML IV SOLN  COMPARISON:  Most recent 07/14/2012.  FINDINGS: The 6 previous brain metastases have all shown significant regression. The only measurable residual enhancement is associated with the largest previous lesion in right  posterior frontal cortex now measuring 1.5 mm as seen on image 120 series 10. The other areas of previous enhancement are indicated with arrows, but there is no discernible residual enhancing lesion associated with the other 5.  Moderate cerebral and cerebellar atrophy. Flow voids are maintained. Confluent periventricular signal abnormality similar to priors, consistent with chronic microvascular ischemic change. No new metastases are evident. No osseous lesion. No meningeal enhancement. Scalp and extracranial soft tissues unremarkable. No sinus or mastoid disease.  IMPRESSION: Positive response to therapy. All lesions are either improved or not visible when compared with prior MRI from June.   Electronically Signed   By: Davonna Belling M.D.   On: 11/15/2012 14:53   Ct Abdomen Pelvis W  Contrast  10/31/2012   CLINICAL DATA:  Weakness. Recent chemotherapy for lung cancer. Staging.  EXAM: CT CHEST, ABDOMEN, AND PELVIS WITH CONTRAST  TECHNIQUE: Multidetector CT imaging of the chest, abdomen and pelvis was performed following the standard protocol during bolus administration of intravenous contrast.  CONTRAST:  OMNIPAQUE IOHEXOL 300 MG/ML  SOLN  COMPARISON:  08/28/2012.  FINDINGS: CT CHEST FINDINGS  Right anterior chest wall port. No supraclavicular lymphadenopathy. Bulky mediastinal adenopathy shows moderate improvement. Prevascular lymph node now no longer visualized as compared with 5 mm previously. 7 mm subcarinal node previously now 5 mm. Persistent left hilar mass diminished in size measuring 32 x 37 mm. Small left effusion. Re-expansion left upper lobe. No other interval findings.  CT ABDOMEN AND PELVIS FINDINGS  Continued improvement prior hepatic metastatic disease. Index lesion referenced previously in the inferior medial right hepatic lobe measuring 7 mm is in bilateral adrenal metastases are decreased also measuring 12 x 24 on the right is compared with 15 x 27 previously. Unremarkable small bowel, stomach, and colon. Normal caliber aorta. No retroperitoneal or periportal adenopathy. No mesenteric disease. Normal bladder. No obstructive uropathy. Moderate feces.  Sclerotic lesions are re-demonstrated in the thoracic and lumbar vertebrae. These appear somewhat more sclerotic than previously although not clearly worse. These include T8, L3, L4, sacrum, and L1. These are consistent with treated metastases. No intraspinal lesions are evident.  IMPRESSION: Improved left hilar primary and improved mediastinal adenopathy. Decreased left effusion. No new lesions.  Improved abdominal metastatic disease including decreased hepatic metastases, reduction of bilateral adrenal metastases as described above. Sclerotic lesions are re- demonstrated and slightly more dense, but not clearly enlarged,  likely response to treatment.   Electronically Signed   By: Davonna Belling M.D.   On: 10/31/2012 17:17    Impression/Plan:   We discussed her positive response to Brain radiotherapy - her brain metastases are well controlled clinically and radiographically.   We discussed Cheryl Jowett's sense of discouragement and fatigue, she is wondering how much more she wants to pursue in terms of procedures and interventions.  After lengthy discussion, she still wants to pursue a f/u MRI and see me in 3 mo to keep an eye on things.  We discussed a palliative care consultation but at this time she would like to decline this.  Their main complaint is orthostatic hypotension. I reviewed her chart and am not sure of the cause. I ordered a stat BMP which did not show significant electrolyte issues or renal failure. Na is a bit low. I am not convinced she is dehydrated, but encouraged boosting her PO intake of liquids and eating foods rick in sodium.  I explained that a formal workup for orthostasis is beyond my expertise, but based on a  literature review, other tests to consider could be phosphorus, VitB12, TSH, 24 urine sodium, cortisol, Echocardiogram.  She apparently had an EKG at her PCP's office.   I encouraged them to explore her symptoms more with Dr Arbutus Ped and her PCP.  They may ultimately want to refer her to another specialist if they see fit.  We will arrange a f/u MRI of the brain and f/u with me in 3 mo. I wished her the best until then.  I spent 40 minutes face to face with the patient and more than 50% of that time was spent in counseling and/or coordination of care. _____________________________________   Lonie Peak, MD

## 2012-11-18 ENCOUNTER — Encounter: Payer: Self-pay | Admitting: Radiation Oncology

## 2012-11-23 ENCOUNTER — Other Ambulatory Visit: Payer: Self-pay

## 2012-12-25 ENCOUNTER — Telehealth: Payer: Self-pay | Admitting: Dietician

## 2012-12-25 NOTE — Telephone Encounter (Signed)
Brief Outpatient Oncology Nutrition Note  Patient has been identified to be at risk on malnutrition screen.  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  Patient reports that she thinks that she is doing quite well at this time.  Eats regular meals and drinks one Boost and one Ensure daily.  Patient reports being unable to gain weight.    Encouraged continued good intake and use of supplements.  Will mail coupons for the Boost and Ensure and provide contact information for [facility].  Oran Rein, RD, LDN

## 2013-01-01 ENCOUNTER — Other Ambulatory Visit (HOSPITAL_BASED_OUTPATIENT_CLINIC_OR_DEPARTMENT_OTHER): Payer: Medicare Other

## 2013-01-01 ENCOUNTER — Encounter (HOSPITAL_COMMUNITY): Payer: Self-pay

## 2013-01-01 ENCOUNTER — Ambulatory Visit (HOSPITAL_COMMUNITY)
Admission: RE | Admit: 2013-01-01 | Discharge: 2013-01-01 | Disposition: A | Discharge status: Home / Self Care | Payer: Medicare Other | Source: Ambulatory Visit | Attending: Internal Medicine | Admitting: Internal Medicine

## 2013-01-01 DIAGNOSIS — C3400 Malignant neoplasm of unspecified main bronchus: Secondary | ICD-10-CM

## 2013-01-01 DIAGNOSIS — C787 Secondary malignant neoplasm of liver and intrahepatic bile duct: Secondary | ICD-10-CM | POA: Insufficient documentation

## 2013-01-01 DIAGNOSIS — C3490 Malignant neoplasm of unspecified part of unspecified bronchus or lung: Secondary | ICD-10-CM | POA: Insufficient documentation

## 2013-01-01 DIAGNOSIS — C3492 Malignant neoplasm of unspecified part of left bronchus or lung: Secondary | ICD-10-CM

## 2013-01-01 DIAGNOSIS — C7931 Secondary malignant neoplasm of brain: Secondary | ICD-10-CM | POA: Insufficient documentation

## 2013-01-01 DIAGNOSIS — Z923 Personal history of irradiation: Secondary | ICD-10-CM | POA: Insufficient documentation

## 2013-01-01 DIAGNOSIS — Z9221 Personal history of antineoplastic chemotherapy: Secondary | ICD-10-CM | POA: Insufficient documentation

## 2013-01-01 DIAGNOSIS — C7970 Secondary malignant neoplasm of unspecified adrenal gland: Secondary | ICD-10-CM | POA: Insufficient documentation

## 2013-01-01 DIAGNOSIS — R911 Solitary pulmonary nodule: Secondary | ICD-10-CM | POA: Insufficient documentation

## 2013-01-01 DIAGNOSIS — R918 Other nonspecific abnormal finding of lung field: Secondary | ICD-10-CM | POA: Insufficient documentation

## 2013-01-01 DIAGNOSIS — C7951 Secondary malignant neoplasm of bone: Secondary | ICD-10-CM | POA: Insufficient documentation

## 2013-01-01 DIAGNOSIS — K7689 Other specified diseases of liver: Secondary | ICD-10-CM | POA: Insufficient documentation

## 2013-01-01 LAB — COMPREHENSIVE METABOLIC PANEL (CC13)
AST: 19 U/L (ref 5–34)
Albumin: 3.7 g/dL (ref 3.5–5.0)
Alkaline Phosphatase: 85 U/L (ref 40–150)
BUN: 17.6 mg/dL (ref 7.0–26.0)
CO2: 26 mEq/L (ref 22–29)
Calcium: 9.6 mg/dL (ref 8.4–10.4)
Chloride: 99 mEq/L (ref 98–109)
Glucose: 90 mg/dl (ref 70–140)
Potassium: 4.7 mEq/L (ref 3.5–5.1)
Sodium: 134 mEq/L — ABNORMAL LOW (ref 136–145)
Total Bilirubin: 0.27 mg/dL (ref 0.20–1.20)
Total Protein: 6.9 g/dL (ref 6.4–8.3)

## 2013-01-01 LAB — CBC WITH DIFFERENTIAL/PLATELET
Basophils Absolute: 0 10*3/uL (ref 0.0–0.1)
Eosinophils Absolute: 0 10*3/uL (ref 0.0–0.5)
HGB: 13.3 g/dL (ref 11.6–15.9)
LYMPH%: 14.8 % (ref 14.0–49.7)
MONO#: 0.8 10*3/uL (ref 0.1–0.9)
MONO%: 14.1 % — ABNORMAL HIGH (ref 0.0–14.0)
NEUT#: 3.9 10*3/uL (ref 1.5–6.5)
RBC: 4.41 10*6/uL (ref 3.70–5.45)
RDW: 13 % (ref 11.2–14.5)
WBC: 5.6 10*3/uL (ref 3.9–10.3)
lymph#: 0.8 10*3/uL — ABNORMAL LOW (ref 0.9–3.3)

## 2013-01-01 MED ORDER — IOHEXOL 300 MG/ML  SOLN
50.0000 mL | Freq: Once | INTRAMUSCULAR | Status: AC | PRN
  Administered 2013-01-01: 50 mL via ORAL

## 2013-01-01 MED ORDER — IOHEXOL 300 MG/ML  SOLN
100.0000 mL | Freq: Once | INTRAMUSCULAR | Status: AC | PRN
  Administered 2013-01-01: 100 mL via INTRAVENOUS

## 2013-01-02 ENCOUNTER — Encounter: Payer: Self-pay | Admitting: Internal Medicine

## 2013-01-02 ENCOUNTER — Encounter: Payer: Self-pay | Admitting: Radiation Oncology

## 2013-01-02 ENCOUNTER — Ambulatory Visit (HOSPITAL_BASED_OUTPATIENT_CLINIC_OR_DEPARTMENT_OTHER): Payer: Medicare Other | Admitting: Internal Medicine

## 2013-01-02 VITALS — BP 127/82 | HR 72 | Temp 97.4°F | Resp 18 | Ht 62.5 in | Wt 105.1 lb

## 2013-01-02 DIAGNOSIS — C3400 Malignant neoplasm of unspecified main bronchus: Secondary | ICD-10-CM

## 2013-01-02 DIAGNOSIS — C3492 Malignant neoplasm of unspecified part of left bronchus or lung: Secondary | ICD-10-CM

## 2013-01-02 DIAGNOSIS — C7931 Secondary malignant neoplasm of brain: Secondary | ICD-10-CM

## 2013-01-02 DIAGNOSIS — C787 Secondary malignant neoplasm of liver and intrahepatic bile duct: Secondary | ICD-10-CM

## 2013-01-02 NOTE — Progress Notes (Signed)
[name] Telephone:([phone]   Fax:([phone]) [phone]  OFFICE PROGRESS NOTE   DIAGNOSIS AND STAGE: Extensive stage small cell lung cancer with large obstructing Center left lung mass with large left pleural effusion, liver and brain metastasis diagnosed in June of 2014   PRIOR THERAPY:  1) Status post whole brain irradiation under the care of Dr. Basilio Cairo completed 09/11/2012.  2) whole brain irradiation under the care of Dr. Basilio Cairo expected to be completed on 09/11/2012.  3) Systemic chemotherapy with carboplatin for AUC of 5 on day 1 and etoposide 120 mg/M2 on days 1, 2 and 3 with Neulasta support on day 4, status post 4 cycles, last cycle was given on 10/11/2012 with partial response.   CURRENT THERAPY:  Systemic chemotherapy with cisplatin 30 mg/M2 and irinotecan 65 mg/M2 on days 1 and 8 every 3 weeks. First dose 01/16/2013.  CHEMOTHERAPY INTENT: Palliative  CURRENT # OF CHEMOTHERAPY CYCLES: 1 CURRENT ANTIEMETICS: Zofran, dexamethasone and Compazine  CURRENT SMOKING STATUS: Former smoker  ORAL CHEMOTHERAPY AND CONSENT: None  CURRENT BISPHOSPHONATES USE: None  PAIN MANAGEMENT: 0/10 on Vicodin  NARCOTICS INDUCED CONSTIPATION: None  LIVING WILL AND CODE STATUS: no CODE BLUE   INTERVAL HISTORY: Cheryl Nielsen 72 y.o. female returns to the clinic today for follow up visit accompanied by her daughter. The patient is feeling much better today with no specific complaints except for mild fatigue. She denied having any significant chest pain but continues to have shortness breath with exertion was no cough or hemoptysis. She has no weight loss or night sweats. She has no nausea or vomiting. She has no fever or chills. The patient has been on observation for the last 3 months. She had repeat CT scan of the chest, abdomen and pelvis performed recently and she is here for evaluation and discussion of her scan results.  MEDICAL HISTORY: Past Medical History  Diagnosis Date  .  Pneumonia, organism unspecified   . 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 Status post chemotherapy  10/11/2012     carboplatin  . S/P radiation therapy 08/24/2012-09/11/2012    Whole Brain  / 32.5 Gy in 13 fractions    ALLERGIES:  is allergic to asa; codeine; and tramadol.  MEDICATIONS:  Current Outpatient Prescriptions  Medication Sig Dispense Refill  . esomeprazole (NEXIUM) 10 MG packet Take 10 mg by mouth daily before breakfast.      . glucosamine-chondroitin 500-400 MG tablet Take 1 tablet by mouth daily with lunch.       . Multiple Vitamin (MULTIVITAMIN WITH MINERALS) TABS Take 1 tablet by mouth daily with lunch. She takes Surveyor, quantity Adult 50+.      Marland Kitchen acetaminophen (TYLENOL) 500 MG tablet Take 1,000 mg by mouth every 6 (six) hours as needed (For headache.).      Marland Kitchen lidocaine-prilocaine (EMLA) cream Apply 1 application topically daily as needed (Applies to port-a-cath.).  30 g  0  . prochlorperazine (COMPAZINE) 10 MG tablet TAKE 1 TABLET BY MOUTH EVERY 6 HOURS AS NEEDED  60 tablet  0   No current facility-administered medications for this visit.    SURGICAL HISTORY:  Past Surgical History  Procedure Laterality Date  . Nasal sinus surgery    . Video bronchoscopy Bilateral 07/05/2012    Procedure: VIDEO BRONCHOSCOPY WITHOUT FLUORO;  Surgeon: Nyoka Cowden, MD;  Location: [facility];  Service: Cardiopulmonary;  Laterality: Bilateral;  . Portacath placement  REVIEW OF SYSTEMS:  Constitutional: positive for anorexia, fatigue and weight loss Eyes: negative Ears, nose, mouth, throat, and face: negative Respiratory: positive for dyspnea on exertion Cardiovascular: negative Gastrointestinal: negative Genitourinary:negative Integument/breast: negative Hematologic/lymphatic: negative Musculoskeletal:negative Neurological: negative Behavioral/Psych: negative Endocrine: negative Allergic/Immunologic: negative   PHYSICAL EXAMINATION:  General appearance: alert, cooperative, fatigued and no distress Head: Normocephalic, without obvious abnormality, atraumatic Neck: no adenopathy, no JVD, supple, symmetrical, trachea midline and thyroid not enlarged, symmetric, no tenderness/mass/nodules Lymph nodes: Cervical, supraclavicular, and axillary nodes normal. Resp: clear to auscultation bilaterally Back: symmetric, no curvature. ROM normal. No CVA tenderness. Cardio: regular rate and rhythm, S1, S2 normal, no murmur, click, rub or gallop GI: soft, non-tender; bowel sounds normal; no masses,  no organomegaly Extremities: extremities normal, atraumatic, no cyanosis or edema Neurologic: Alert and oriented X 3, normal strength and tone. Normal symmetric reflexes. Normal coordination and gait  ECOG PERFORMANCE STATUS: 1 - Symptomatic but completely ambulatory  Blood pressure 127/82, pulse 72, temperature 97.4 F (36.3 C), temperature source Oral, resp. rate 18, height 5' 2.5" (1.588 m), weight 105 lb 1.6 oz (47.673 kg).  LABORATORY DATA: Lab Results  Component Value Date   WBC 5.6 01/01/2013   HGB 13.3 01/01/2013   HCT 39.7 01/01/2013   MCV 90.1 01/01/2013   PLT 215 01/01/2013      Chemistry      Component Value Date/Time   NA 134* 01/01/2013 1346   NA 132* 11/17/2012 1228   K 4.7 01/01/2013 1346   K 4.6 11/17/2012 1228   CL 96 11/17/2012 1228   CL 93* 07/10/2012 1544   CO2 26 01/01/2013 1346   CO2 26 11/17/2012 1228   BUN 17.6 01/01/2013 1346   BUN 16 11/17/2012 1228   CREATININE 0.9 01/01/2013 1346   CREATININE 1.05 11/17/2012 1228      Component Value Date/Time   CALCIUM 9.6 01/01/2013 1346   CALCIUM 9.9 11/17/2012 1228   ALKPHOS 85 01/01/2013 1346   AST 19 01/01/2013 1346   ALT 12 01/01/2013 1346   BILITOT 0.27 01/01/2013 1346       RADIOGRAPHIC STUDIES: Ct Chest W Contrast  01/01/2013   CLINICAL DATA:  Small cell lung cancer. Restaging exam. Diagnosis in 2004 with brain metastasis. Chemotherapy and  radiation therapy complete.  EXAM: CT CHEST, ABDOMEN, AND PELVIS WITH CONTRAST  TECHNIQUE: Multidetector CT imaging of the chest, abdomen and pelvis was performed following the standard protocol during bolus administration of intravenous contrast.  CONTRAST:  50mL OMNIPAQUE IOHEXOL 300 MG/ML SOLN, OMNIPAQUE IOHEXOL 300 MG/ML SOLN  COMPARISON:  CT ABD/PELVIS W CM dated 10/31/2012; CT ABD/PELVIS W CM dated 08/28/2012  FINDINGS: CT CHEST FINDINGS  There is a port in the right anterior chest wall. No axillary or supraclavicular lymphadenopathy. No mediastinal or hilar lymphadenopathy. There is 2 new lymph nodes in the prevascular space behind the manubrium measuring 8 mm each (image 17). New left infraclavicular lymph node measuring 12 mm (image 14).  Within the left lower lobe there is a new consolidative process with peripheral nodularity. This consolidative process measures approximately 4.5 x 3.2 cm (image 33). Within the left upper lobe there is again demonstrated perihilar masslike thickening which is similar to prior. This measures approximately 4.0 x 2.2 cm compared to 4.4 x 3.6 cm on prior. Nodule in the periphery of the left upper lobe measuring 11 mm (image number 10) is new from prior. Right lung is clear.  CT ABDOMEN AND PELVIS FINDINGS  There is a  new enhancing lesion within the right hepatic lobe measuring 21 mm (image 56). Peripheral enhancing lesion in the posterior aspect of the left lateral lobe (image 53) may represent hemangioma and is unchanged. Low-density cystic lesions in the left lateral hepatic lobe are stable. Only 1 hepatic metastasis identified.  The gallbladder, pancreas, spleen, kidneys are unchanged. 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  The stomach, small bowel, appendix, cecum are normal. The colon and rectosigmoid colon are normal.  Abdominal aorta is normal caliber. There is some ill-defined tissue within the retrocrural space which  could represent lymph nodes which is slightly more prominent than prior.  The uterus and bladder normal. No pelvic lymphadenopathy. There is a fluid collection in the right groin which appears benign.  Review of the bone windows demonstrate multiple sclerotic skeletal metastases within the sacrum, spine, and right scapula. . 8 mm lesion in the right sacral ale is unchanged from 9 mm on prior. Sclerotic lesion at L4 measures 13 mm compared to 11 mm on prior. Sclerotic lesion in the base acromion on of the right scapula is unchanged.  IMPRESSION: 1. New consolidation with peripheral nodularity within the left lower lobe is concerning for lung cancer metastasis. Cannot exclude pulmonary infection but less favored. 2. Left suprahilar masslike thickening is slightly decreased in volume. 3. New nodule at the left lung apex concerning for pleural metastatic disease. 4. New small prevascular and left infraclavicular lymph nodes consistent with progression of mediastinal nodal metastasis. 5. New solitary  hepatic metastasis. 6. Stable adrenal metastasis. 7. Stable skeletal metastasis.   Electronically Signed   By: Genevive Bi M.D.   On: 01/01/2013 17:12    ASSESSMENT AND PLAN: this is a very pleasant 72 years old white female with extensive stage small cell lung cancer status post 4 cycles of systemic chemotherapy with carboplatin and etoposide with significant improvement in her disease. Her recent restaging scan showed evidence for disease progression.  I discussed the scan results with the patient and her daughter and showed them the images. I gave the patient the option of palliative care and hospice referral versus consideration of second line treatment with either single agent topotecan or combination of cisplatin and irinotecan. The patient is interested in proceeding with treatment and she would like to consider treatment with cisplatin and irinotecan. She would be treated with cisplatin 30 mg/M2 and  irinotecan 65 mg/M2 on days 1 and 8 every 3 weeks. First dose expected on 01/16/2013 because she would like to enjoy the Christmas holiday with her family. I discussed with the patient and her daughter the adverse effect of this treatment including but not limited to alopecia, myelosuppression, nausea and vomiting, peripheral neuropathy, liver or renal dysfunction as well as hearing deficit. The patient would come back for followup visit in 5 weeks with the start of cycle #2 of her chemotherapy. She was advised to call immediately if she has any concerning symptoms in the interval. The patient voices understanding of current disease status and treatment options and is in agreement with the current care plan.  All questions were answered. The patient knows to call the clinic with any problems, questions or concerns. We can certainly see the patient much sooner if necessary.  I spent 15 minutes counseling the patient face to face. The total time spent in the appointment was 25 minutes.

## 2013-01-02 NOTE — Patient Instructions (Signed)
CURRENT THERAPY:  Systemic chemotherapy with cisplatin 30 mg/M2 and irinotecan 65 mg/M2 on days 1 and 8 every 3 weeks. First dose 01/16/2013.  CHEMOTHERAPY INTENT: Palliative  CURRENT # OF CHEMOTHERAPY CYCLES: 0 CURRENT ANTIEMETICS: Zofran, dexamethasone and Compazine  CURRENT SMOKING STATUS: Former smoker  ORAL CHEMOTHERAPY AND CONSENT: None  CURRENT BISPHOSPHONATES USE: None  PAIN MANAGEMENT: 0/10 on Vicodin  NARCOTICS INDUCED CONSTIPATION: None  LIVING WILL AND CODE STATUS: no CODE BLUE

## 2013-01-02 NOTE — Progress Notes (Signed)
Rec'd disability paperwork from patient.  Forwarded to Nursing.

## 2013-01-04 ENCOUNTER — Telehealth: Payer: Self-pay | Admitting: Internal Medicine

## 2013-01-04 NOTE — Telephone Encounter (Signed)
message to maggie/michelle re added tx 12/31 - MM/AJ copied. no room to start 12/30. pt not contacting w/any appts yet. waiting to hear back re when tx can be started.

## 2013-01-05 ENCOUNTER — Telehealth: Payer: Self-pay | Admitting: *Deleted

## 2013-01-05 ENCOUNTER — Telehealth: Payer: Self-pay | Admitting: Radiation Oncology

## 2013-01-05 NOTE — Telephone Encounter (Signed)
Placed purple folder with [employer] Paperwork in Dr. Kathrynn Running inbox. Paperwork complete with exception of his signature.

## 2013-01-05 NOTE — Telephone Encounter (Signed)
Per staff message and POF I have scheduled appts.  No available the week on 12/29 until 1/2. Appts scheduled for 1/2 to hold spot. Scheduled notiied JMW

## 2013-01-06 ENCOUNTER — Other Ambulatory Visit: Payer: Self-pay | Admitting: Internal Medicine

## 2013-01-08 ENCOUNTER — Telehealth: Payer: Self-pay | Admitting: Internal Medicine

## 2013-01-08 NOTE — Telephone Encounter (Signed)
lmonvm for pt re appt for 1/2 and mailed scheduled. per MM ok to delay to 1/2 due to tx room full but after 1/2 move to thursday.

## 2013-01-19 ENCOUNTER — Other Ambulatory Visit (HOSPITAL_BASED_OUTPATIENT_CLINIC_OR_DEPARTMENT_OTHER): Payer: Medicare Other

## 2013-01-19 ENCOUNTER — Ambulatory Visit (HOSPITAL_BASED_OUTPATIENT_CLINIC_OR_DEPARTMENT_OTHER): Payer: Medicare Other

## 2013-01-19 VITALS — BP 110/75 | HR 80 | Temp 98.4°F | Resp 18

## 2013-01-19 DIAGNOSIS — C3400 Malignant neoplasm of unspecified main bronchus: Secondary | ICD-10-CM

## 2013-01-19 DIAGNOSIS — Z5111 Encounter for antineoplastic chemotherapy: Secondary | ICD-10-CM

## 2013-01-19 DIAGNOSIS — C3492 Malignant neoplasm of unspecified part of left bronchus or lung: Secondary | ICD-10-CM

## 2013-01-19 LAB — COMPREHENSIVE METABOLIC PANEL (CC13)
ALK PHOS: 91 U/L (ref 40–150)
ALT: 12 U/L (ref 0–55)
AST: 20 U/L (ref 5–34)
Albumin: 3.7 g/dL (ref 3.5–5.0)
Anion Gap: 10 mEq/L (ref 3–11)
BUN: 19.4 mg/dL (ref 7.0–26.0)
CO2: 26 meq/L (ref 22–29)
Calcium: 9.8 mg/dL (ref 8.4–10.4)
Chloride: 99 mEq/L (ref 98–109)
Creatinine: 1.1 mg/dL (ref 0.6–1.1)
Glucose: 60 mg/dl — ABNORMAL LOW (ref 70–140)
Potassium: 4.5 mEq/L (ref 3.5–5.1)
SODIUM: 136 meq/L (ref 136–145)
TOTAL PROTEIN: 7.1 g/dL (ref 6.4–8.3)
Total Bilirubin: 0.41 mg/dL (ref 0.20–1.20)

## 2013-01-19 LAB — CBC WITH DIFFERENTIAL/PLATELET
BASO%: 1.1 % (ref 0.0–2.0)
Basophils Absolute: 0.1 10*3/uL (ref 0.0–0.1)
EOS%: 1.4 % (ref 0.0–7.0)
Eosinophils Absolute: 0.1 10*3/uL (ref 0.0–0.5)
HCT: 42.2 % (ref 34.8–46.6)
HGB: 13.9 g/dL (ref 11.6–15.9)
LYMPH%: 13.2 % — AB (ref 14.0–49.7)
MCH: 29.3 pg (ref 25.1–34.0)
MCHC: 32.9 g/dL (ref 31.5–36.0)
MCV: 89.1 fL (ref 79.5–101.0)
MONO#: 0.7 10*3/uL (ref 0.1–0.9)
MONO%: 13.5 % (ref 0.0–14.0)
NEUT%: 70.8 % (ref 38.4–76.8)
NEUTROS ABS: 3.4 10*3/uL (ref 1.5–6.5)
PLATELETS: 217 10*3/uL (ref 145–400)
RBC: 4.74 10*6/uL (ref 3.70–5.45)
RDW: 13 % (ref 11.2–14.5)
WBC: 4.9 10*3/uL (ref 3.9–10.3)
lymph#: 0.6 10*3/uL — ABNORMAL LOW (ref 0.9–3.3)

## 2013-01-19 LAB — MAGNESIUM (CC13): Magnesium: 2.1 mg/dl (ref 1.5–2.5)

## 2013-01-19 MED ORDER — PALONOSETRON HCL INJECTION 0.25 MG/5ML
0.2500 mg | Freq: Once | INTRAVENOUS | Status: AC
  Administered 2013-01-19: 0.25 mg via INTRAVENOUS

## 2013-01-19 MED ORDER — DEXAMETHASONE SODIUM PHOSPHATE 20 MG/5ML IJ SOLN
INTRAMUSCULAR | Status: AC
  Filled 2013-01-19: qty 5

## 2013-01-19 MED ORDER — IRINOTECAN HCL CHEMO INJECTION 100 MG/5ML
65.0000 mg/m2 | Freq: Once | INTRAVENOUS | Status: AC
  Administered 2013-01-19: 94 mg via INTRAVENOUS
  Filled 2013-01-19: qty 4.7

## 2013-01-19 MED ORDER — PALONOSETRON HCL INJECTION 0.25 MG/5ML
INTRAVENOUS | Status: AC
  Filled 2013-01-19: qty 5

## 2013-01-19 MED ORDER — POTASSIUM CHLORIDE 2 MEQ/ML IV SOLN
Freq: Once | INTRAVENOUS | Status: AC
  Administered 2013-01-19: 10:00:00 via INTRAVENOUS
  Filled 2013-01-19: qty 10

## 2013-01-19 MED ORDER — SODIUM CHLORIDE 0.9 % IV SOLN
Freq: Once | INTRAVENOUS | Status: AC
  Administered 2013-01-19: 10:00:00 via INTRAVENOUS

## 2013-01-19 MED ORDER — HEPARIN SOD (PORK) LOCK FLUSH 100 UNIT/ML IV SOLN
500.0000 [IU] | Freq: Once | INTRAVENOUS | Status: AC | PRN
  Administered 2013-01-19: 500 [IU]
  Filled 2013-01-19: qty 5

## 2013-01-19 MED ORDER — SODIUM CHLORIDE 0.9 % IV SOLN
150.0000 mg | Freq: Once | INTRAVENOUS | Status: AC
  Administered 2013-01-19: 150 mg via INTRAVENOUS
  Filled 2013-01-19: qty 5

## 2013-01-19 MED ORDER — SODIUM CHLORIDE 0.9 % IJ SOLN
10.0000 mL | INTRAMUSCULAR | Status: DC | PRN
  Administered 2013-01-19: 10 mL
  Filled 2013-01-19: qty 10

## 2013-01-19 MED ORDER — ATROPINE SULFATE 1 MG/ML IJ SOLN
INTRAMUSCULAR | Status: AC
  Filled 2013-01-19: qty 1

## 2013-01-19 MED ORDER — DEXAMETHASONE SODIUM PHOSPHATE 20 MG/5ML IJ SOLN
12.0000 mg | Freq: Once | INTRAMUSCULAR | Status: AC
  Administered 2013-01-19: 12 mg via INTRAVENOUS

## 2013-01-19 MED ORDER — SODIUM CHLORIDE 0.9 % IV SOLN
30.0000 mg/m2 | Freq: Once | INTRAVENOUS | Status: AC
  Administered 2013-01-19: 44 mg via INTRAVENOUS
  Filled 2013-01-19: qty 44

## 2013-01-19 MED ORDER — ATROPINE SULFATE 1 MG/ML IJ SOLN
0.5000 mg | Freq: Once | INTRAMUSCULAR | Status: AC | PRN
  Administered 2013-01-19: 0.5 mg via INTRAVENOUS

## 2013-01-19 NOTE — Progress Notes (Signed)
Pt voided 300 of urine.  Treatment plan released.

## 2013-01-19 NOTE — Patient Instructions (Addendum)
[place]  Discharge Instructions for Patients Receiving Chemotherapy  Today you received the following chemotherapy agents: Cisplatin, Irinotecan.   To help prevent nausea and vomiting after your treatment, we encourage you to take your nausea medication as prescribed.    If you develop nausea and vomiting that is not controlled by your nausea medication, call the clinic.   BELOW ARE SYMPTOMS THAT SHOULD BE REPORTED IMMEDIATELY:  *FEVER GREATER THAN 100.5 F  *CHILLS WITH OR WITHOUT FEVER  NAUSEA AND VOMITING THAT IS NOT CONTROLLED WITH YOUR NAUSEA MEDICATION  *UNUSUAL SHORTNESS OF BREATH  *UNUSUAL BRUISING OR BLEEDING  TENDERNESS IN MOUTH AND THROAT WITH OR WITHOUT PRESENCE OF ULCERS  *URINARY PROBLEMS  *BOWEL PROBLEMS  UNUSUAL RASH Items with * indicate a potential emergency and should be followed up as soon as possible.   Feel free to call the clinic should you have any questions or concerns. The clinic phone number is [phone].   Cisplatin injection What is this medicine? CISPLATIN (SIS pla tin) is a chemotherapy drug. It targets fast dividing cells, like cancer cells, and causes these cells to die. This medicine is used to treat many types of cancer like bladder, ovarian, and testicular cancers. This medicine may be used for other purposes; ask your health care provider or pharmacist if you have questions. COMMON BRAND NAME(S): Platinol -AQ, Platinol What should I tell my health care provider before I take this medicine? They need to know if you have any of these conditions: -blood disorders -hearing problems -kidney disease -recent or ongoing radiation therapy -an unusual or allergic reaction to cisplatin, carboplatin, other chemotherapy, other medicines, foods, dyes, or preservatives -pregnant or trying to get pregnant -breast-feeding How should I use this medicine? This drug is given as an infusion into a vein. It is administered in  a hospital or clinic by a specially trained health care professional. Talk to your pediatrician regarding the use of this medicine in children. Special care may be needed. Overdosage: If you think you have taken too much of this medicine contact a poison control center or emergency room at once. NOTE: This medicine is only for you. Do not share this medicine with others. What if I miss a dose? It is important not to miss a dose. Call your doctor or health care professional if you are unable to keep an appointment. What may interact with this medicine? -dofetilide -foscarnet -medicines for seizures -medicines to increase blood counts like filgrastim, pegfilgrastim, sargramostim -probenecid -pyridoxine used with altretamine -rituximab -some antibiotics like amikacin, gentamicin, neomycin, polymyxin B, streptomycin, tobramycin -sulfinpyrazone -vaccines -zalcitabine Talk to your doctor or health care professional before taking any of these medicines: -acetaminophen -aspirin -ibuprofen -ketoprofen -naproxen This list may not describe all possible interactions. Give your health care provider a list of all the medicines, herbs, non-prescription drugs, or dietary supplements you use. Also tell them if you smoke, drink alcohol, or use illegal drugs. Some items may interact with your medicine. What should I watch for while using this medicine? Your condition will be monitored carefully while you are receiving this medicine. You will need important blood work done while you are taking this medicine. This drug may make you feel generally unwell. This is not uncommon, as chemotherapy can affect healthy cells as well as cancer cells. Report any side effects. Continue your course of treatment even though you feel ill unless your doctor tells you to stop. In some cases, you may be given additional medicines  to help with side effects. Follow all directions for their use. Call your doctor or health care  professional for advice if you get a fever, chills or sore throat, or other symptoms of a cold or flu. Do not treat yourself. This drug decreases your body's ability to fight infections. Try to avoid being around people who are sick. This medicine may increase your risk to bruise or bleed. Call your doctor or health care professional if you notice any unusual bleeding. Be careful brushing and flossing your teeth or using a toothpick because you may get an infection or bleed more easily. If you have any dental work done, tell your dentist you are receiving this medicine. Avoid taking products that contain aspirin, acetaminophen, ibuprofen, naproxen, or ketoprofen unless instructed by your doctor. These medicines may hide a fever. Do not become pregnant while taking this medicine. Women should inform their doctor if they wish to become pregnant or think they might be pregnant. There is a potential for serious side effects to an unborn child. Talk to your health care professional or pharmacist for more information. Do not breast-feed an infant while taking this medicine. Drink fluids as directed while you are taking this medicine. This will help protect your kidneys. Call your doctor or health care professional if you get diarrhea. Do not treat yourself. What side effects may I notice from receiving this medicine? Side effects that you should report to your doctor or health care professional as soon as possible: -allergic reactions like skin rash, itching or hives, swelling of the face, lips, or tongue -signs of infection - fever or chills, cough, sore throat, pain or difficulty passing urine -signs of decreased platelets or bleeding - bruising, pinpoint red spots on the skin, black, tarry stools, nosebleeds -signs of decreased red blood cells - unusually weak or tired, fainting spells, lightheadedness -breathing problems -changes in hearing -gout pain -low blood counts - This drug may decrease the  number of white blood cells, red blood cells and platelets. You may be at increased risk for infections and bleeding. -nausea and vomiting -pain, swelling, redness or irritation at the injection site -pain, tingling, numbness in the hands or feet -problems with balance, movement -trouble passing urine or change in the amount of urine Side effects that usually do not require medical attention (report to your doctor or health care professional if they continue or are bothersome): -changes in vision -loss of appetite -metallic taste in the mouth or changes in taste This list may not describe all possible side effects. Call your doctor for medical advice about side effects. You may report side effects to FDA at 1-800-FDA-1088. Where should I keep my medicine? This drug is given in a hospital or clinic and will not be stored at home. NOTE: This sheet is a summary. It may not cover all possible information. If you have questions about this medicine, talk to your doctor, pharmacist, or health care provider.  2014, [employer] Standard. (2007-04-11 14:40:54)   Irinotecan injection What is this medicine? IRINOTECAN (ir in oh TEE kan ) is a chemotherapy drug. It is used to treat colon and rectal cancer. This medicine may be used for other purposes; ask your health care provider or pharmacist if you have questions. COMMON BRAND NAME(S): Camptosar What should I tell my health care provider before I take this medicine? They need to know if you have any of these conditions: -blood disorders -dehydration -diarrhea -infection (especially a virus infection such as chickenpox, cold sores,  or herpes) -liver disease -low blood counts, like low white cell, platelet, or red cell counts -recent or ongoing radiation therapy -an unusual or allergic reaction to irinotecan, sorbitol, other chemotherapy, other medicines, foods, dyes, or preservatives -pregnant or trying to get pregnant -breast-feeding How  should I use this medicine? This drug is given as an infusion into a vein. It is administered in a hospital or clinic by a specially trained health care professional. Talk to your pediatrician regarding the use of this medicine in children. Special care may be needed. Overdosage: If you think you have taken too much of this medicine contact a poison control center or emergency room at once. NOTE: This medicine is only for you. Do not share this medicine with others. What if I miss a dose? It is important not to miss your dose. Call your doctor or health care professional if you are unable to keep an appointment. What may interact with this medicine? Do not take this medicine with any of the following medications: -atazanavir -ketoconazole -St. John's Wort This medicine may also interact with the following medications: -dexamethasone -diuretics -laxatives -medicines for seizures like carbamazepine, mephobarbital, phenobarbital, phenytoin, primidone -medicines to increase blood counts like filgrastim, pegfilgrastim, sargramostim -prochlorperazine -vaccines This list may not describe all possible interactions. Give your health care provider a list of all the medicines, herbs, non-prescription drugs, or dietary supplements you use. Also tell them if you smoke, drink alcohol, or use illegal drugs. Some items may interact with your medicine. What should I watch for while using this medicine? Your condition will be monitored carefully while you are receiving this medicine. You will need important blood work done while you are taking this medicine. This drug may make you feel generally unwell. This is not uncommon, as chemotherapy can affect healthy cells as well as cancer cells. Report any side effects. Continue your course of treatment even though you feel ill unless your doctor tells you to stop. In some cases, you may be given additional medicines to help with side effects. Follow all directions  for their use. You may get drowsy or dizzy. Do not drive, use machinery, or do anything that needs mental alertness until you know how this medicine affects you. Do not stand or sit up quickly, especially if you are an older patient. This reduces the risk of dizzy or fainting spells. Call your doctor or health care professional for advice if you get a fever, chills or sore throat, or other symptoms of a cold or flu. Do not treat yourself. This drug decreases your body's ability to fight infections. Try to avoid being around people who are sick. This medicine may increase your risk to bruise or bleed. Call your doctor or health care professional if you notice any unusual bleeding. Be careful brushing and flossing your teeth or using a toothpick because you may get an infection or bleed more easily. If you have any dental work done, tell your dentist you are receiving this medicine. Avoid taking products that contain aspirin, acetaminophen, ibuprofen, naproxen, or ketoprofen unless instructed by your doctor. These medicines may hide a fever. Do not become pregnant while taking this medicine. Women should inform their doctor if they wish to become pregnant or think they might be pregnant. There is a potential for serious side effects to an unborn child. Talk to your health care professional or pharmacist for more information. Do not breast-feed an infant while taking this medicine. What side effects may I notice from receiving  this medicine? Side effects that you should report to your doctor or health care professional as soon as possible: -allergic reactions like skin rash, itching or hives, swelling of the face, lips, or tongue -low blood counts - this medicine may decrease the number of white blood cells, red blood cells and platelets. You may be at increased risk for infections and bleeding. -signs of infection - fever or chills, cough, sore throat, pain or difficulty passing urine -signs of decreased  platelets or bleeding - bruising, pinpoint red spots on the skin, black, tarry stools, blood in the urine -signs of decreased red blood cells - unusually weak or tired, fainting spells, lightheadedness -breathing problems -chest pain -diarrhea -feeling faint or lightheaded, falls -flushing, runny nose, sweating during infusion -mouth sores or pain -pain, swelling, redness or irritation where injected -pain, swelling, warmth in the leg -pain, tingling, numbness in the hands or feet -problems with balance, talking, walking -stomach cramps, pain -trouble passing urine or change in the amount of urine -vomiting as to be unable to hold down drinks or food -yellowing of the eyes or skin Side effects that usually do not require medical attention (report to your doctor or health care professional if they continue or are bothersome): -constipation -hair loss -headache -loss of appetite -nausea, vomiting -stomach upset This list may not describe all possible side effects. Call your doctor for medical advice about side effects. You may report side effects to FDA at 1-800-FDA-1088. Where should I keep my medicine? This drug is given in a hospital or clinic and will not be stored at home. NOTE: This sheet is a summary. It may not cover all possible information. If you have questions about this medicine, talk to your doctor, pharmacist, or health care provider.  2014, [employer] Standard. (2007-05-23 16:29:12)

## 2013-01-23 ENCOUNTER — Telehealth: Payer: Self-pay | Admitting: *Deleted

## 2013-01-23 NOTE — Telephone Encounter (Signed)
[facility] at [phone] number(s).  [facility]ge left requesting a return call for chemotherapy follow up.  Awaiting return call from patient.

## 2013-01-23 NOTE — Telephone Encounter (Signed)
Message copied by Cherylynn Ridges on Tue Jan 23, 2013 12:35 PM ------      Message from: Christa See      Created: Fri Jan 19, 2013  5:28 PM      Regarding: Chemo f/u call      Contact: [phone]       Patient received first time cisplatin and irinotecan Friday. Last chemo before this was over 2 months ago - [place] and VP-16. Can you call and see how she did over the weekend? MD is Mohamed. Thanks! Kristen        ------

## 2013-01-25 ENCOUNTER — Telehealth: Payer: Self-pay | Admitting: *Deleted

## 2013-01-25 ENCOUNTER — Other Ambulatory Visit (HOSPITAL_BASED_OUTPATIENT_CLINIC_OR_DEPARTMENT_OTHER): Payer: Medicare Other

## 2013-01-25 ENCOUNTER — Ambulatory Visit (HOSPITAL_BASED_OUTPATIENT_CLINIC_OR_DEPARTMENT_OTHER): Payer: Medicare Other

## 2013-01-25 VITALS — BP 113/65 | HR 71 | Temp 98.8°F | Resp 18

## 2013-01-25 DIAGNOSIS — C3492 Malignant neoplasm of unspecified part of left bronchus or lung: Secondary | ICD-10-CM

## 2013-01-25 DIAGNOSIS — C7949 Secondary malignant neoplasm of other parts of nervous system: Secondary | ICD-10-CM

## 2013-01-25 DIAGNOSIS — C787 Secondary malignant neoplasm of liver and intrahepatic bile duct: Secondary | ICD-10-CM

## 2013-01-25 DIAGNOSIS — C7931 Secondary malignant neoplasm of brain: Secondary | ICD-10-CM

## 2013-01-25 DIAGNOSIS — Z5111 Encounter for antineoplastic chemotherapy: Secondary | ICD-10-CM

## 2013-01-25 DIAGNOSIS — C3400 Malignant neoplasm of unspecified main bronchus: Secondary | ICD-10-CM

## 2013-01-25 LAB — CBC WITH DIFFERENTIAL/PLATELET
BASO%: 0.4 % (ref 0.0–2.0)
Basophils Absolute: 0 10*3/uL (ref 0.0–0.1)
EOS%: 2.7 % (ref 0.0–7.0)
Eosinophils Absolute: 0.1 10*3/uL (ref 0.0–0.5)
HCT: 42.1 % (ref 34.8–46.6)
HGB: 14.2 g/dL (ref 11.6–15.9)
LYMPH%: 17.2 % (ref 14.0–49.7)
MCH: 29.6 pg (ref 25.1–34.0)
MCHC: 33.7 g/dL (ref 31.5–36.0)
MCV: 87.9 fL (ref 79.5–101.0)
MONO#: 0.4 10*3/uL (ref 0.1–0.9)
MONO%: 8.7 % (ref 0.0–14.0)
NEUT%: 71 % (ref 38.4–76.8)
NEUTROS ABS: 3.2 10*3/uL (ref 1.5–6.5)
NRBC: 0 % (ref 0–0)
Platelets: 192 10*3/uL (ref 145–400)
RBC: 4.79 10*6/uL (ref 3.70–5.45)
RDW: 12.5 % (ref 11.2–14.5)
WBC: 4.5 10*3/uL (ref 3.9–10.3)
lymph#: 0.8 10*3/uL — ABNORMAL LOW (ref 0.9–3.3)

## 2013-01-25 LAB — MAGNESIUM (CC13): Magnesium: 2 mg/dl (ref 1.5–2.5)

## 2013-01-25 LAB — COMPREHENSIVE METABOLIC PANEL (CC13)
ALK PHOS: 96 U/L (ref 40–150)
ALT: 18 U/L (ref 0–55)
AST: 17 U/L (ref 5–34)
Albumin: 3.6 g/dL (ref 3.5–5.0)
Anion Gap: 8 mEq/L (ref 3–11)
BUN: 19 mg/dL (ref 7.0–26.0)
CO2: 27 mEq/L (ref 22–29)
Calcium: 9.3 mg/dL (ref 8.4–10.4)
Chloride: 98 mEq/L (ref 98–109)
Creatinine: 0.9 mg/dL (ref 0.6–1.1)
Glucose: 75 mg/dl (ref 70–140)
Potassium: 4.8 mEq/L (ref 3.5–5.1)
SODIUM: 133 meq/L — AB (ref 136–145)
TOTAL PROTEIN: 6.6 g/dL (ref 6.4–8.3)
Total Bilirubin: 0.31 mg/dL (ref 0.20–1.20)

## 2013-01-25 MED ORDER — ATROPINE SULFATE 1 MG/ML IJ SOLN
INTRAMUSCULAR | Status: AC
  Filled 2013-01-25: qty 1

## 2013-01-25 MED ORDER — SODIUM CHLORIDE 0.9 % IV SOLN
30.0000 mg/m2 | Freq: Once | INTRAVENOUS | Status: AC
  Administered 2013-01-25: 44 mg via INTRAVENOUS
  Filled 2013-01-25: qty 44

## 2013-01-25 MED ORDER — HEPARIN SOD (PORK) LOCK FLUSH 100 UNIT/ML IV SOLN
500.0000 [IU] | Freq: Once | INTRAVENOUS | Status: AC | PRN
  Administered 2013-01-25: 500 [IU]
  Filled 2013-01-25: qty 5

## 2013-01-25 MED ORDER — FOSAPREPITANT DIMEGLUMINE INJECTION 150 MG
150.0000 mg | Freq: Once | INTRAVENOUS | Status: AC
  Administered 2013-01-25: 150 mg via INTRAVENOUS
  Filled 2013-01-25: qty 5

## 2013-01-25 MED ORDER — SODIUM CHLORIDE 0.9 % IV SOLN
Freq: Once | INTRAVENOUS | Status: AC
  Administered 2013-01-25: 10:00:00 via INTRAVENOUS

## 2013-01-25 MED ORDER — DEXAMETHASONE SODIUM PHOSPHATE 20 MG/5ML IJ SOLN
INTRAMUSCULAR | Status: AC
  Filled 2013-01-25: qty 5

## 2013-01-25 MED ORDER — POTASSIUM CHLORIDE 2 MEQ/ML IV SOLN
Freq: Once | INTRAVENOUS | Status: AC
  Administered 2013-01-25: 10:00:00 via INTRAVENOUS
  Filled 2013-01-25: qty 10

## 2013-01-25 MED ORDER — DEXAMETHASONE SODIUM PHOSPHATE 20 MG/5ML IJ SOLN
12.0000 mg | Freq: Once | INTRAMUSCULAR | Status: AC
  Administered 2013-01-25: 12 mg via INTRAVENOUS

## 2013-01-25 MED ORDER — SODIUM CHLORIDE 0.9 % IJ SOLN
10.0000 mL | INTRAMUSCULAR | Status: DC | PRN
  Administered 2013-01-25: 10 mL
  Filled 2013-01-25: qty 10

## 2013-01-25 MED ORDER — ATROPINE SULFATE 1 MG/ML IJ SOLN
0.5000 mg | Freq: Once | INTRAMUSCULAR | Status: AC | PRN
  Administered 2013-01-25: 0.5 mg via INTRAVENOUS

## 2013-01-25 MED ORDER — PALONOSETRON HCL INJECTION 0.25 MG/5ML
INTRAVENOUS | Status: AC
  Filled 2013-01-25: qty 5

## 2013-01-25 MED ORDER — PALONOSETRON HCL INJECTION 0.25 MG/5ML
0.2500 mg | Freq: Once | INTRAVENOUS | Status: AC
  Administered 2013-01-25: 0.25 mg via INTRAVENOUS

## 2013-01-25 MED ORDER — IRINOTECAN HCL CHEMO INJECTION 100 MG/5ML
65.0000 mg/m2 | Freq: Once | INTRAVENOUS | Status: AC
  Administered 2013-01-25: 94 mg via INTRAVENOUS
  Filled 2013-01-25: qty 4.7

## 2013-01-25 NOTE — Patient Instructions (Addendum)
[facility] Discharge Instructions for Patients Receiving Chemotherapy  Today you received the following chemotherapy agents: irinotecan, cisplatin  To help prevent nausea and vomiting after your treatment, we encourage you to take your nausea medication.  Take it as often as prescribed.     If you develop nausea and vomiting that is not controlled by your nausea medication, call the clinic. If it is after clinic hours your family physician or the after hours number for the clinic or go to the Emergency Department.   BELOW ARE SYMPTOMS THAT SHOULD BE REPORTED IMMEDIATELY:  *FEVER GREATER THAN 100.5 F  *CHILLS WITH OR WITHOUT FEVER  NAUSEA AND VOMITING THAT IS NOT CONTROLLED WITH YOUR NAUSEA MEDICATION  *UNUSUAL SHORTNESS OF BREATH  *UNUSUAL BRUISING OR BLEEDING  TENDERNESS IN MOUTH AND THROAT WITH OR WITHOUT PRESENCE OF ULCERS  *URINARY PROBLEMS  *BOWEL PROBLEMS  UNUSUAL RASH Items with * indicate a potential emergency and should be followed up as soon as possible.  Feel free to call the clinic you have any questions or concerns. The clinic phone number is [phone].

## 2013-01-25 NOTE — Telephone Encounter (Signed)
On 01-25-13 give forms to daughter that Dr. Tammi Klippel sign.

## 2013-01-26 ENCOUNTER — Ambulatory Visit: Payer: Medicare Other

## 2013-01-29 ENCOUNTER — Other Ambulatory Visit: Payer: Self-pay | Admitting: Radiation Therapy

## 2013-01-29 DIAGNOSIS — C7949 Secondary malignant neoplasm of other parts of nervous system: Principal | ICD-10-CM

## 2013-01-29 DIAGNOSIS — C7931 Secondary malignant neoplasm of brain: Secondary | ICD-10-CM

## 2013-02-06 ENCOUNTER — Other Ambulatory Visit: Payer: Medicare Other

## 2013-02-06 ENCOUNTER — Ambulatory Visit: Payer: Medicare Other | Admitting: Physician Assistant

## 2013-02-08 ENCOUNTER — Ambulatory Visit (HOSPITAL_BASED_OUTPATIENT_CLINIC_OR_DEPARTMENT_OTHER): Payer: Medicare Other | Admitting: Physician Assistant

## 2013-02-08 ENCOUNTER — Other Ambulatory Visit (HOSPITAL_BASED_OUTPATIENT_CLINIC_OR_DEPARTMENT_OTHER): Payer: Medicare Other

## 2013-02-08 ENCOUNTER — Encounter: Payer: Self-pay | Admitting: Physician Assistant

## 2013-02-08 ENCOUNTER — Ambulatory Visit (HOSPITAL_BASED_OUTPATIENT_CLINIC_OR_DEPARTMENT_OTHER): Payer: Medicare Other

## 2013-02-08 VITALS — BP 107/69 | HR 75 | Temp 97.6°F | Resp 18 | Ht 62.5 in | Wt 107.2 lb

## 2013-02-08 DIAGNOSIS — C3490 Malignant neoplasm of unspecified part of unspecified bronchus or lung: Secondary | ICD-10-CM

## 2013-02-08 DIAGNOSIS — R5383 Other fatigue: Secondary | ICD-10-CM

## 2013-02-08 DIAGNOSIS — C7931 Secondary malignant neoplasm of brain: Secondary | ICD-10-CM

## 2013-02-08 DIAGNOSIS — C3400 Malignant neoplasm of unspecified main bronchus: Secondary | ICD-10-CM

## 2013-02-08 DIAGNOSIS — R5381 Other malaise: Secondary | ICD-10-CM

## 2013-02-08 DIAGNOSIS — Z5111 Encounter for antineoplastic chemotherapy: Secondary | ICD-10-CM

## 2013-02-08 DIAGNOSIS — C7949 Secondary malignant neoplasm of other parts of nervous system: Secondary | ICD-10-CM

## 2013-02-08 DIAGNOSIS — C787 Secondary malignant neoplasm of liver and intrahepatic bile duct: Secondary | ICD-10-CM

## 2013-02-08 DIAGNOSIS — C3492 Malignant neoplasm of unspecified part of left bronchus or lung: Secondary | ICD-10-CM

## 2013-02-08 LAB — CBC WITH DIFFERENTIAL/PLATELET
BASO%: 0.6 % (ref 0.0–2.0)
Basophils Absolute: 0 10*3/uL (ref 0.0–0.1)
EOS%: 4 % (ref 0.0–7.0)
Eosinophils Absolute: 0.1 10*3/uL (ref 0.0–0.5)
HCT: 35.7 % (ref 34.8–46.6)
HGB: 12 g/dL (ref 11.6–15.9)
LYMPH%: 24.5 % (ref 14.0–49.7)
MCH: 29.2 pg (ref 25.1–34.0)
MCHC: 33.6 g/dL (ref 31.5–36.0)
MCV: 86.9 fL (ref 79.5–101.0)
MONO#: 0.5 10*3/uL (ref 0.1–0.9)
MONO%: 16.2 % — AB (ref 0.0–14.0)
NEUT#: 1.8 10*3/uL (ref 1.5–6.5)
NEUT%: 54.7 % (ref 38.4–76.8)
PLATELETS: 235 10*3/uL (ref 145–400)
RBC: 4.11 10*6/uL (ref 3.70–5.45)
RDW: 13.2 % (ref 11.2–14.5)
WBC: 3.3 10*3/uL — ABNORMAL LOW (ref 3.9–10.3)
lymph#: 0.8 10*3/uL — ABNORMAL LOW (ref 0.9–3.3)

## 2013-02-08 LAB — COMPREHENSIVE METABOLIC PANEL (CC13)
ALBUMIN: 3.1 g/dL — AB (ref 3.5–5.0)
ALT: 17 U/L (ref 0–55)
AST: 18 U/L (ref 5–34)
Alkaline Phosphatase: 84 U/L (ref 40–150)
Anion Gap: 7 mEq/L (ref 3–11)
BUN: 14.1 mg/dL (ref 7.0–26.0)
CHLORIDE: 101 meq/L (ref 98–109)
CO2: 28 mEq/L (ref 22–29)
Calcium: 8.8 mg/dL (ref 8.4–10.4)
Creatinine: 0.8 mg/dL (ref 0.6–1.1)
Glucose: 83 mg/dl (ref 70–140)
Potassium: 4.5 mEq/L (ref 3.5–5.1)
SODIUM: 136 meq/L (ref 136–145)
Total Bilirubin: 0.22 mg/dL (ref 0.20–1.20)
Total Protein: 5.5 g/dL — ABNORMAL LOW (ref 6.4–8.3)

## 2013-02-08 LAB — MAGNESIUM (CC13): Magnesium: 1.7 mg/dl (ref 1.5–2.5)

## 2013-02-08 MED ORDER — IRINOTECAN HCL CHEMO INJECTION 100 MG/5ML
65.0000 mg/m2 | Freq: Once | INTRAVENOUS | Status: AC
  Administered 2013-02-08: 94 mg via INTRAVENOUS
  Filled 2013-02-08: qty 4.7

## 2013-02-08 MED ORDER — DEXAMETHASONE SODIUM PHOSPHATE 20 MG/5ML IJ SOLN
INTRAMUSCULAR | Status: AC
  Filled 2013-02-08: qty 5

## 2013-02-08 MED ORDER — SODIUM CHLORIDE 0.9 % IV SOLN
30.0000 mg/m2 | Freq: Once | INTRAVENOUS | Status: AC
  Administered 2013-02-08: 44 mg via INTRAVENOUS
  Filled 2013-02-08: qty 44

## 2013-02-08 MED ORDER — SODIUM CHLORIDE 0.9 % IJ SOLN
10.0000 mL | INTRAMUSCULAR | Status: DC | PRN
  Administered 2013-02-08: 10 mL
  Filled 2013-02-08: qty 10

## 2013-02-08 MED ORDER — SODIUM CHLORIDE 0.9 % IV SOLN
Freq: Once | INTRAVENOUS | Status: AC
  Administered 2013-02-08: 10:00:00 via INTRAVENOUS

## 2013-02-08 MED ORDER — ATROPINE SULFATE 1 MG/ML IJ SOLN
0.5000 mg | Freq: Once | INTRAMUSCULAR | Status: AC | PRN
  Administered 2013-02-08: 0.5 mg via INTRAVENOUS

## 2013-02-08 MED ORDER — ATROPINE SULFATE 1 MG/ML IJ SOLN
INTRAMUSCULAR | Status: AC
  Filled 2013-02-08: qty 1

## 2013-02-08 MED ORDER — POTASSIUM CHLORIDE 2 MEQ/ML IV SOLN
Freq: Once | INTRAVENOUS | Status: AC
  Administered 2013-02-08: 11:00:00 via INTRAVENOUS
  Filled 2013-02-08: qty 10

## 2013-02-08 MED ORDER — SODIUM CHLORIDE 0.9 % IV SOLN
150.0000 mg | Freq: Once | INTRAVENOUS | Status: AC
  Administered 2013-02-08: 150 mg via INTRAVENOUS
  Filled 2013-02-08: qty 5

## 2013-02-08 MED ORDER — PALONOSETRON HCL INJECTION 0.25 MG/5ML
INTRAVENOUS | Status: AC
  Filled 2013-02-08: qty 5

## 2013-02-08 MED ORDER — DEXAMETHASONE SODIUM PHOSPHATE 20 MG/5ML IJ SOLN
12.0000 mg | Freq: Once | INTRAMUSCULAR | Status: AC
  Administered 2013-02-08: 12 mg via INTRAVENOUS

## 2013-02-08 MED ORDER — HEPARIN SOD (PORK) LOCK FLUSH 100 UNIT/ML IV SOLN
500.0000 [IU] | Freq: Once | INTRAVENOUS | Status: AC | PRN
  Administered 2013-02-08: 500 [IU]
  Filled 2013-02-08: qty 5

## 2013-02-08 MED ORDER — PALONOSETRON HCL INJECTION 0.25 MG/5ML
0.2500 mg | Freq: Once | INTRAVENOUS | Status: AC
  Administered 2013-02-08: 0.25 mg via INTRAVENOUS

## 2013-02-08 NOTE — Progress Notes (Addendum)
[place] [phone]   [phone]  OFFICE PROGRESS NOTE   DIAGNOSIS AND STAGE: Extensive stage small cell lung cancer with large obstructing Center left lung mass with large left pleural effusion, liver and brain metastasis diagnosed in June of 2014   PRIOR THERAPY:  1) Status post whole brain irradiation under the care of Dr. Isidore Moos completed 09/11/2012.  2) whole brain irradiation under the care of Dr. Isidore Moos expected to be completed on 09/11/2012.  3) Systemic chemotherapy with carboplatin for AUC of 5 on day 1 and etoposide 120 mg/M2 on days 1, 2 and 3 with Neulasta support on day 4, status post 4 cycles, last cycle was given on 10/11/2012 with partial response.   CURRENT THERAPY:  Systemic chemotherapy with cisplatin 30 mg/M2 and irinotecan 65 mg/M2 on days 1 and 8 every 3 weeks. First dose 01/16/2013. Status post 1 cycle  CHEMOTHERAPY INTENT: Palliative  CURRENT # OF CHEMOTHERAPY CYCLES: 1 CURRENT ANTIEMETICS: Zofran, dexamethasone and Compazine  CURRENT SMOKING STATUS: Former smoker  ORAL CHEMOTHERAPY AND CONSENT: None  CURRENT BISPHOSPHONATES USE: None  PAIN MANAGEMENT: 0/10 on Vicodin  NARCOTICS INDUCED CONSTIPATION: None  LIVING WILL AND CODE STATUS: no CODE BLUE   INTERVAL HISTORY: Cheryl Nielsen 73 y.o. female returns to the clinic today for follow up visit accompanied by her daughter. She tolerated her first cycle of systemic chemotherapy with cisplatin and irinotecan relatively well with the exception of some stomach cramping after day 8. She also had significant decreased energy. She reports over the past few days she is felt significantly better. Her appetite is great. She also reports some occasional sternal discomfort, usually occurs at night, not associated with any nausea, vomiting, radiation or diaphoresis. The patient is feeling much better today with no specific complaints except for mild fatigue. She denied having any  significant chest pain but continues to have shortness breath with exertion was no cough or hemoptysis. She has no weight loss or night sweats. She denied nausea,  vomiting, fever or chills.   MEDICAL HISTORY: Past Medical History  Diagnosis Date  . Pneumonia, organism unspecified   . 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 Status post chemotherapy  10/11/2012     carboplatin  . S/P radiation therapy 08/24/2012-09/11/2012    Whole Brain  / 32.5 Gy in 13 fractions    ALLERGIES:  is allergic to asa; codeine; and tramadol.  MEDICATIONS:  Current Outpatient Prescriptions  Medication Sig Dispense Refill  . acetaminophen (TYLENOL) 500 MG tablet Take 1,000 mg by mouth every 6 (six) hours as needed (For headache.).      Marland Kitchen esomeprazole (NEXIUM) 10 MG packet Take 10 mg by mouth daily before breakfast.      . glucosamine-chondroitin 500-400 MG tablet Take 1 tablet by mouth daily with lunch.       . lidocaine-prilocaine (EMLA) cream Apply 1 application topically daily as needed (Applies to port-a-cath.).  30 g  0  . Multiple Vitamin (MULTIVITAMIN WITH MINERALS) TABS Take 1 tablet by mouth daily with lunch. She takes Dance movement psychotherapist Adult 50+.      . prochlorperazine (COMPAZINE) 10 MG tablet TAKE 1 TABLET BY MOUTH EVERY 6 HOURS AS NEEDED  60 tablet  0   No current facility-administered medications for this visit.    SURGICAL HISTORY:  Past Surgical History  Procedure Laterality Date  . Nasal sinus surgery    . Video bronchoscopy Bilateral  07/05/2012    Procedure: VIDEO BRONCHOSCOPY WITHOUT FLUORO;  Surgeon: Tanda Rockers, MD;  Location: [facility];  Service: Cardiopulmonary;  Laterality: Bilateral;  . Portacath placement      REVIEW OF SYSTEMS:  Constitutional: positive for fatigue Eyes: negative Ears, nose, mouth, throat, and face: negative Respiratory: positive for dyspnea on exertion Cardiovascular: negative Gastrointestinal: positive for intermittent  brief episodes of stomach/abdominal cramping Genitourinary:negative Integument/breast: negative Hematologic/lymphatic: negative Musculoskeletal:negative Neurological: negative Behavioral/Psych: negative Endocrine: negative Allergic/Immunologic: negative   PHYSICAL EXAMINATION: General appearance: alert, cooperative, fatigued and no distress Head: Normocephalic, without obvious abnormality, atraumatic Neck: no adenopathy, no JVD, supple, symmetrical, trachea midline and thyroid not enlarged, symmetric, no tenderness/mass/nodules Lymph nodes: Cervical, supraclavicular, and axillary nodes normal. Resp: clear to auscultation bilaterally Back: symmetric, no curvature. ROM normal. No CVA tenderness. Cardio: regular rate and rhythm, S1, S2 normal, no murmur, click, rub or gallop GI: soft, non-tender; bowel sounds normal; no masses,  no organomegaly Extremities: extremities normal, atraumatic, no cyanosis or edema Neurologic: Alert and oriented X 3, normal strength and tone. Normal symmetric reflexes. Normal coordination and gait  ECOG PERFORMANCE STATUS: 1 - Symptomatic but completely ambulatory  Blood pressure 107/69, pulse 75, temperature 97.6 F (36.4 C), temperature source Oral, resp. rate 18, height 5' 2.5" (1.588 m), weight 107 lb 3.2 oz (48.626 kg), SpO2 98.00%.  LABORATORY DATA: Lab Results  Component Value Date   WBC 3.3* 02/08/2013   HGB 12.0 02/08/2013   HCT 35.7 02/08/2013   MCV 86.9 02/08/2013   PLT 235 02/08/2013      Chemistry      Component Value Date/Time   NA 133* 01/25/2013 0923   NA 132* 11/17/2012 1228   K 4.8 01/25/2013 0923   K 4.6 11/17/2012 1228   CL 96 11/17/2012 1228   CL 93* 07/10/2012 1544   CO2 27 01/25/2013 0923   CO2 26 11/17/2012 1228   BUN 19.0 01/25/2013 0923   BUN 16 11/17/2012 1228   CREATININE 0.9 01/25/2013 0923   CREATININE 1.05 11/17/2012 1228      Component Value Date/Time   CALCIUM 9.3 01/25/2013 0923   CALCIUM 9.9 11/17/2012 1228   ALKPHOS 96  01/25/2013 0923   AST 17 01/25/2013 0923   ALT 18 01/25/2013 0923   BILITOT 0.31 01/25/2013 0923       RADIOGRAPHIC STUDIES: Ct Chest W Contrast  01/01/2013   CLINICAL DATA:  Small cell lung cancer. Restaging exam. Diagnosis in 2004 with brain metastasis. Chemotherapy and radiation therapy complete.  EXAM: CT CHEST, ABDOMEN, AND PELVIS WITH CONTRAST  TECHNIQUE: Multidetector CT imaging of the chest, abdomen and pelvis was performed following the standard protocol during bolus administration of intravenous contrast.  CONTRAST:  39mL OMNIPAQUE IOHEXOL 300 MG/ML SOLN, 138mL OMNIPAQUE IOHEXOL 300 MG/ML SOLN  COMPARISON:  CT ABD/PELVIS W CM dated 10/31/2012; CT ABD/PELVIS W CM dated 08/28/2012  FINDINGS: CT CHEST FINDINGS  There is a port in the right anterior chest wall. No axillary or supraclavicular lymphadenopathy. No mediastinal or hilar lymphadenopathy. There is 2 new lymph nodes in the prevascular space behind the manubrium measuring 8 mm each (image 17). New left infraclavicular lymph node measuring 12 mm (image 14).  Within the left lower lobe there is a new consolidative process with peripheral nodularity. This consolidative process measures approximately 4.5 x 3.2 cm (image 33). Within the left upper lobe there is again demonstrated perihilar masslike thickening which is similar to prior. This measures approximately 4.0 x 2.2 cm compared to  4.4 x 3.6 cm on prior. Nodule in the periphery of the left upper lobe measuring 11 mm (image number 10) is new from prior. Right lung is clear.  CT ABDOMEN AND PELVIS FINDINGS  There is a new enhancing lesion within the right hepatic lobe measuring 21 mm (image 56). Peripheral enhancing lesion in the posterior aspect of the left lateral lobe (image 53) may represent hemangioma and is unchanged. Low-density cystic lesions in the left lateral hepatic lobe are stable. Only 1 hepatic metastasis identified.  The gallbladder, pancreas, spleen, kidneys are unchanged. There is  again demonstrated bilateral adrenal metastasis not changed from prior.  The stomach, small bowel, appendix, cecum are normal. The colon and rectosigmoid colon are normal.  Abdominal aorta is normal caliber. There is some ill-defined tissue within the retrocrural space which could represent lymph nodes which is slightly more prominent than prior.  The uterus and bladder normal. No pelvic lymphadenopathy. There is a fluid collection in the right groin which appears benign.  Review of the bone windows demonstrate multiple sclerotic skeletal metastases within the sacrum, spine, and right scapula. . 8 mm lesion in the right sacral ale is unchanged from 9 mm on prior. Sclerotic lesion at L4 measures 13 mm compared to 11 mm on prior. Sclerotic lesion in the base acromion on of the right scapula is unchanged.  IMPRESSION: 1. New consolidation with peripheral nodularity within the left lower lobe is concerning for lung cancer metastasis. Cannot exclude pulmonary infection but less favored. 2. Left suprahilar masslike thickening is slightly decreased in volume. 3. New nodule at the left lung apex concerning for pleural metastatic disease. 4. New small prevascular and left infraclavicular lymph nodes consistent with progression of mediastinal nodal metastasis. 5. New solitary  hepatic metastasis. 6. Stable adrenal metastasis. 7. Stable skeletal metastasis.   Electronically Signed   By: Suzy Bouchard M.D.   On: 01/01/2013 17:12    ASSESSMENT AND PLAN: this is a very pleasant 74 years old white female with extensive stage small cell lung cancer status post 4 cycles of systemic chemotherapy with carboplatin and etoposide with significant improvement in her disease. Her recent restaging scan showed evidence for disease progression. She's now been treated with systemic chemotherapy in the form of cisplatin at 30 mg per meter squared and irinotecan at 65 mg per meter square given on days 1 and 8 every 3 weeks, status post 1  cycle. Overall she tolerated her chemotherapy relatively well. She did receive atropine after her chemotherapy. We'll continue to monitor her complaints of abdominal cramping and may need to add in a medication such as Levsin if her symptoms persist. The patient was discussed with him also seen by Dr. Julien Nordmann. She'll proceed with cycle #2 of his systemic chemotherapy with cisplatin and irinotecan. She will continue with labs as scheduled. She'll return in 3 weeks prior to the start of cycle #3.  She was advised to call immediately if she has any concerning symptoms in the interval. The patient voices understanding of current disease status and treatment options and is in agreement with the current care plan.  All questions were answered. The patient knows to call the clinic with any problems, questions or concerns. We can certainly see the patient much sooner if necessary.  Cheryl Adam PA-C  ADDENDUM: Hematology/Oncology Attending: I had the face to face encounter with the patient today. I recommended her care plan. This is a very pleasant 73 years old white female with extensive stage small  cell lung cancer who is currently undergoing second line chemotherapy with cisplatin and irinotecan is status post 1 cycle. She tolerated the first cycle fairly well except for mild abdominal cramps after day 8 of the first cycle. This resolved spontaneously. She denied having any significant nausea or vomiting. The patient denied having any significant chest pain, shortness of breath, cough or hemoptysis. Will proceed today with cycle #2 as scheduled. She would come back for followup visit in 3 weeks with the start of cycle #3. She was advised to call immediately if she has any concerning symptoms in the interval.  Disclaimer: This note was dictated with voice recognition software. Similar sounding words can inadvertently be transcribed and may not be corrected upon review. Eilleen Kempf.,  MD 02/08/2013

## 2013-02-08 NOTE — Patient Instructions (Signed)
Continue labs and chemotherapy as scheduled Follow up in 3 weeks prior to the start of your next scheduled cycle of chemotherapy

## 2013-02-08 NOTE — Patient Instructions (Signed)
[place] Discharge Instructions for Patients Receiving Chemotherapy  Today you received the following chemotherapy agents :  Camptosar,  Cisplatin.  To help prevent nausea and vomiting after your treatment, we encourage you to take your nausea medication as instructed by your physician.   If you develop nausea and vomiting that is not controlled by your nausea medication, call the clinic.   BELOW ARE SYMPTOMS THAT SHOULD BE REPORTED IMMEDIATELY:  *FEVER GREATER THAN 100.5 F  *CHILLS WITH OR WITHOUT FEVER  NAUSEA AND VOMITING THAT IS NOT CONTROLLED WITH YOUR NAUSEA MEDICATION  *UNUSUAL SHORTNESS OF BREATH  *UNUSUAL BRUISING OR BLEEDING  TENDERNESS IN MOUTH AND THROAT WITH OR WITHOUT PRESENCE OF ULCERS  *URINARY PROBLEMS  *BOWEL PROBLEMS  UNUSUAL RASH Items with * indicate a potential emergency and should be followed up as soon as possible.  Feel free to call the clinic you have any questions or concerns. The clinic phone number is [phone].

## 2013-02-09 ENCOUNTER — Telehealth: Payer: Self-pay | Admitting: Internal Medicine

## 2013-02-09 NOTE — Telephone Encounter (Signed)
lvm for pt regarding to Jan adn Feb appt...sed added tx....mailed pt appt sched, avs and letter

## 2013-02-15 ENCOUNTER — Other Ambulatory Visit (HOSPITAL_BASED_OUTPATIENT_CLINIC_OR_DEPARTMENT_OTHER): Payer: Medicare Other

## 2013-02-15 ENCOUNTER — Encounter: Payer: Self-pay | Admitting: Internal Medicine

## 2013-02-15 ENCOUNTER — Other Ambulatory Visit: Payer: Self-pay | Admitting: Pharmacist

## 2013-02-15 ENCOUNTER — Ambulatory Visit (HOSPITAL_BASED_OUTPATIENT_CLINIC_OR_DEPARTMENT_OTHER): Payer: Medicare Other

## 2013-02-15 VITALS — BP 111/65 | HR 93 | Temp 97.2°F | Resp 18

## 2013-02-15 DIAGNOSIS — C3400 Malignant neoplasm of unspecified main bronchus: Secondary | ICD-10-CM

## 2013-02-15 DIAGNOSIS — C3490 Malignant neoplasm of unspecified part of unspecified bronchus or lung: Secondary | ICD-10-CM

## 2013-02-15 DIAGNOSIS — C7949 Secondary malignant neoplasm of other parts of nervous system: Secondary | ICD-10-CM

## 2013-02-15 DIAGNOSIS — C3492 Malignant neoplasm of unspecified part of left bronchus or lung: Secondary | ICD-10-CM

## 2013-02-15 DIAGNOSIS — C787 Secondary malignant neoplasm of liver and intrahepatic bile duct: Secondary | ICD-10-CM

## 2013-02-15 DIAGNOSIS — C7931 Secondary malignant neoplasm of brain: Secondary | ICD-10-CM

## 2013-02-15 DIAGNOSIS — Z5111 Encounter for antineoplastic chemotherapy: Secondary | ICD-10-CM

## 2013-02-15 LAB — COMPREHENSIVE METABOLIC PANEL (CC13)
ALBUMIN: 3.3 g/dL — AB (ref 3.5–5.0)
ALT: 19 U/L (ref 0–55)
ANION GAP: 8 meq/L (ref 3–11)
AST: 15 U/L (ref 5–34)
Alkaline Phosphatase: 109 U/L (ref 40–150)
BUN: 19.3 mg/dL (ref 7.0–26.0)
CALCIUM: 9.1 mg/dL (ref 8.4–10.4)
CO2: 26 meq/L (ref 22–29)
Chloride: 101 mEq/L (ref 98–109)
Creatinine: 0.9 mg/dL (ref 0.6–1.1)
GLUCOSE: 104 mg/dL (ref 70–140)
POTASSIUM: 4.4 meq/L (ref 3.5–5.1)
Sodium: 135 mEq/L — ABNORMAL LOW (ref 136–145)
Total Bilirubin: 0.2 mg/dL (ref 0.20–1.20)
Total Protein: 6.1 g/dL — ABNORMAL LOW (ref 6.4–8.3)

## 2013-02-15 LAB — CBC WITH DIFFERENTIAL/PLATELET
BASO%: 0.8 % (ref 0.0–2.0)
BASOS ABS: 0 10*3/uL (ref 0.0–0.1)
EOS%: 0.3 % (ref 0.0–7.0)
Eosinophils Absolute: 0 10*3/uL (ref 0.0–0.5)
HEMATOCRIT: 39.2 % (ref 34.8–46.6)
HGB: 13.1 g/dL (ref 11.6–15.9)
LYMPH%: 27 % (ref 14.0–49.7)
MCH: 29 pg (ref 25.1–34.0)
MCHC: 33.4 g/dL (ref 31.5–36.0)
MCV: 86.7 fL (ref 79.5–101.0)
MONO#: 0.6 10*3/uL (ref 0.1–0.9)
MONO%: 15.8 % — ABNORMAL HIGH (ref 0.0–14.0)
NEUT#: 2 10*3/uL (ref 1.5–6.5)
NEUT%: 56.1 % (ref 38.4–76.8)
Platelets: 189 10*3/uL (ref 145–400)
RBC: 4.52 10*6/uL (ref 3.70–5.45)
RDW: 13.3 % (ref 11.2–14.5)
WBC: 3.6 10*3/uL — ABNORMAL LOW (ref 3.9–10.3)
lymph#: 1 10*3/uL (ref 0.9–3.3)
nRBC: 0 % (ref 0–0)

## 2013-02-15 LAB — MAGNESIUM (CC13): MAGNESIUM: 1.7 mg/dL (ref 1.5–2.5)

## 2013-02-15 MED ORDER — DEXAMETHASONE SODIUM PHOSPHATE 20 MG/5ML IJ SOLN
INTRAMUSCULAR | Status: AC
  Filled 2013-02-15: qty 5

## 2013-02-15 MED ORDER — PALONOSETRON HCL INJECTION 0.25 MG/5ML
INTRAVENOUS | Status: AC
  Filled 2013-02-15: qty 5

## 2013-02-15 MED ORDER — SODIUM CHLORIDE 0.9 % IV SOLN
Freq: Once | INTRAVENOUS | Status: AC
Start: 2013-02-15 — End: 2013-02-15
  Administered 2013-02-15: 09:00:00 via INTRAVENOUS

## 2013-02-15 MED ORDER — PALONOSETRON HCL INJECTION 0.25 MG/5ML
0.2500 mg | Freq: Once | INTRAVENOUS | Status: AC
  Administered 2013-02-15: 0.25 mg via INTRAVENOUS

## 2013-02-15 MED ORDER — ATROPINE SULFATE 1 MG/ML IJ SOLN
0.5000 mg | Freq: Once | INTRAMUSCULAR | Status: AC | PRN
  Administered 2013-02-15: 0.5 mg via INTRAVENOUS

## 2013-02-15 MED ORDER — SODIUM CHLORIDE 0.9 % IJ SOLN
10.0000 mL | INTRAMUSCULAR | Status: DC | PRN
  Administered 2013-02-15: 10 mL
  Filled 2013-02-15: qty 10

## 2013-02-15 MED ORDER — ATROPINE SULFATE 1 MG/ML IJ SOLN
INTRAMUSCULAR | Status: AC
  Filled 2013-02-15: qty 1

## 2013-02-15 MED ORDER — POTASSIUM CHLORIDE 2 MEQ/ML IV SOLN
Freq: Once | INTRAVENOUS | Status: AC
  Administered 2013-02-15: 09:00:00 via INTRAVENOUS
  Filled 2013-02-15: qty 10

## 2013-02-15 MED ORDER — SODIUM CHLORIDE 0.9 % IV SOLN
150.0000 mg | Freq: Once | INTRAVENOUS | Status: AC
  Administered 2013-02-15: 150 mg via INTRAVENOUS
  Filled 2013-02-15: qty 5

## 2013-02-15 MED ORDER — IRINOTECAN HCL CHEMO INJECTION 100 MG/5ML
65.0000 mg/m2 | Freq: Once | INTRAVENOUS | Status: AC
  Administered 2013-02-15: 94 mg via INTRAVENOUS
  Filled 2013-02-15: qty 4.7

## 2013-02-15 MED ORDER — SODIUM CHLORIDE 0.9 % IV SOLN
30.0000 mg/m2 | Freq: Once | INTRAVENOUS | Status: AC
  Administered 2013-02-15: 44 mg via INTRAVENOUS
  Filled 2013-02-15: qty 44

## 2013-02-15 MED ORDER — DEXAMETHASONE SODIUM PHOSPHATE 20 MG/5ML IJ SOLN
12.0000 mg | Freq: Once | INTRAMUSCULAR | Status: AC
  Administered 2013-02-15: 12 mg via INTRAVENOUS

## 2013-02-15 MED ORDER — HEPARIN SOD (PORK) LOCK FLUSH 100 UNIT/ML IV SOLN
500.0000 [IU] | Freq: Once | INTRAVENOUS | Status: AC | PRN
  Administered 2013-02-15: 500 [IU]
  Filled 2013-02-15: qty 5

## 2013-02-15 NOTE — Progress Notes (Signed)
Put daughter's fmla form on nurse's desk.

## 2013-02-19 ENCOUNTER — Encounter: Payer: Self-pay | Admitting: Internal Medicine

## 2013-02-19 NOTE — Progress Notes (Signed)
Put daughter's fmla form in registration desk.

## 2013-02-22 ENCOUNTER — Other Ambulatory Visit (HOSPITAL_BASED_OUTPATIENT_CLINIC_OR_DEPARTMENT_OTHER): Payer: Medicare Other

## 2013-02-22 DIAGNOSIS — C3490 Malignant neoplasm of unspecified part of unspecified bronchus or lung: Secondary | ICD-10-CM

## 2013-02-22 DIAGNOSIS — C3400 Malignant neoplasm of unspecified main bronchus: Secondary | ICD-10-CM

## 2013-02-22 DIAGNOSIS — C787 Secondary malignant neoplasm of liver and intrahepatic bile duct: Secondary | ICD-10-CM

## 2013-02-22 DIAGNOSIS — C7931 Secondary malignant neoplasm of brain: Secondary | ICD-10-CM

## 2013-02-22 DIAGNOSIS — C3492 Malignant neoplasm of unspecified part of left bronchus or lung: Secondary | ICD-10-CM

## 2013-02-22 DIAGNOSIS — C7949 Secondary malignant neoplasm of other parts of nervous system: Secondary | ICD-10-CM

## 2013-02-22 LAB — CBC WITH DIFFERENTIAL/PLATELET
BASO%: 0.2 % (ref 0.0–2.0)
Basophils Absolute: 0 10*3/uL (ref 0.0–0.1)
EOS ABS: 0 10*3/uL (ref 0.0–0.5)
EOS%: 0.7 % (ref 0.0–7.0)
HCT: 37.7 % (ref 34.8–46.6)
HEMOGLOBIN: 12.7 g/dL (ref 11.6–15.9)
LYMPH#: 0.7 10*3/uL — AB (ref 0.9–3.3)
LYMPH%: 19.1 % (ref 14.0–49.7)
MCH: 29.5 pg (ref 25.1–34.0)
MCHC: 33.7 g/dL (ref 31.5–36.0)
MCV: 87.7 fL (ref 79.5–101.0)
MONO#: 0.5 10*3/uL (ref 0.1–0.9)
MONO%: 14.5 % — ABNORMAL HIGH (ref 0.0–14.0)
NEUT%: 65.5 % (ref 38.4–76.8)
NEUTROS ABS: 2.3 10*3/uL (ref 1.5–6.5)
Platelets: 257 10*3/uL (ref 145–400)
RBC: 4.3 10*6/uL (ref 3.70–5.45)
RDW: 13.7 % (ref 11.2–14.5)
WBC: 3.5 10*3/uL — ABNORMAL LOW (ref 3.9–10.3)

## 2013-02-22 LAB — COMPREHENSIVE METABOLIC PANEL (CC13)
ALBUMIN: 3.5 g/dL (ref 3.5–5.0)
ALT: 20 U/L (ref 0–55)
ANION GAP: 7 meq/L (ref 3–11)
AST: 16 U/L (ref 5–34)
Alkaline Phosphatase: 103 U/L (ref 40–150)
BUN: 19 mg/dL (ref 7.0–26.0)
CO2: 28 mEq/L (ref 22–29)
Calcium: 9.3 mg/dL (ref 8.4–10.4)
Chloride: 101 mEq/L (ref 98–109)
Creatinine: 1 mg/dL (ref 0.6–1.1)
GLUCOSE: 83 mg/dL (ref 70–140)
POTASSIUM: 4.2 meq/L (ref 3.5–5.1)
Sodium: 136 mEq/L (ref 136–145)
Total Bilirubin: 0.23 mg/dL (ref 0.20–1.20)
Total Protein: 6.1 g/dL — ABNORMAL LOW (ref 6.4–8.3)

## 2013-02-22 LAB — MAGNESIUM (CC13): MAGNESIUM: 1.7 mg/dL (ref 1.5–2.5)

## 2013-02-23 ENCOUNTER — Ambulatory Visit
Admission: RE | Admit: 2013-02-23 | Discharge: 2013-02-23 | Disposition: A | Discharge status: Home / Self Care | Payer: Medicare Other | Source: Ambulatory Visit | Attending: Radiation Oncology | Admitting: Radiation Oncology

## 2013-02-23 DIAGNOSIS — C7949 Secondary malignant neoplasm of other parts of nervous system: Principal | ICD-10-CM

## 2013-02-23 DIAGNOSIS — C7931 Secondary malignant neoplasm of brain: Secondary | ICD-10-CM

## 2013-02-23 MED ORDER — GADOBENATE DIMEGLUMINE 529 MG/ML IV SOLN
10.0000 mL | Freq: Once | INTRAVENOUS | Status: AC | PRN
  Administered 2013-02-23: 10 mL via INTRAVENOUS

## 2013-02-26 ENCOUNTER — Ambulatory Visit
Admission: RE | Admit: 2013-02-26 | Discharge: 2013-02-26 | Disposition: A | Discharge status: Home / Self Care | Payer: Medicare Other | Source: Ambulatory Visit | Attending: Radiation Oncology | Admitting: Radiation Oncology

## 2013-02-26 ENCOUNTER — Encounter: Payer: Self-pay | Admitting: Radiation Oncology

## 2013-02-26 VITALS — BP 119/76 | HR 96 | Temp 97.8°F | Resp 20 | Wt 107.1 lb

## 2013-02-26 DIAGNOSIS — C7949 Secondary malignant neoplasm of other parts of nervous system: Principal | ICD-10-CM

## 2013-02-26 DIAGNOSIS — C7931 Secondary malignant neoplasm of brain: Secondary | ICD-10-CM

## 2013-02-26 NOTE — Progress Notes (Signed)
Follow yup SRS brain , MRI 02/23/13 results here to duiscuss results, no nausea, blurred vision, or heada aches, or dizziness, eating well, has even gained some weight, energy level comes and goes 75% good 8:39 AM

## 2013-02-26 NOTE — Progress Notes (Signed)
Radiation Oncology         [phone] ________________________________  Name: Cheryl Nielsen MRN: [id number]  Date: 02/26/2013  DOB: 1940/01/25  Follow-Up Visit Note  outpatient  CC: No PCP Per Patient  No ref. provider found  Diagnosis and Prior Radiotherapy:   Stage IV Small Cell Lung Cancer with brain metastases    Radiation treatment dates: 08/24/2012-09/11/2012  Site/dose: Whole Brain / 32.5 Gy in 13 fractions  Radiation treatment dates: 07/24/2012-08/10/2012  Site/dose: Mediastinum and Left Hilum / 35 Gy in 14 fractions    Narrative:  The patient returns today for routine follow-up. MRI of brain looks very good; as discussed at tumor board this AM it showed no new or progressive lesions. The only identifiable focus of enhancement is a 1.5 mm focus in the right parietal vertex region, unchanged since the previous study.   She denies nausea, blurred vision, or headaches, or dizziness; she is eating well, has even gained some weight, energy level comes and goes.  She is currently receiving systemic chemotherapy in the form of cis-platinum and irinotecan for systemic progression. CT scan of her chest in December demonstrated a new solitary hepatic metastasis, stable adrenal metastases, stable skeletal metastases, and evidence of progression in the chest.   She reports that she continues to feel off balance when she walks. This gets better as the day progresses. She still continues to have issues with transient hypotension. This is relatively stable and she is coping fairly well in spite of the symptoms. She denies headaches. She denies any new neurologic issues.     ALLERGIES:  is allergic to asa; codeine; and tramadol.  Meds: Current Outpatient Prescriptions  Medication Sig Dispense Refill  . acetaminophen (TYLENOL) 500 MG tablet Take 1,000 mg by mouth every 6 (six) hours as needed (For headache.).      Marland Kitchen esomeprazole (NEXIUM) 10 MG packet Take 10 mg by mouth daily before breakfast.       . glucosamine-chondroitin 500-400 MG tablet Take 1 tablet by mouth daily with lunch.       . Multiple Vitamin (MULTIVITAMIN WITH MINERALS) TABS Take 1 tablet by mouth daily with lunch. She takes Dance movement psychotherapist Adult 50+.      . lidocaine-prilocaine (EMLA) cream Apply 1 application topically daily as needed (Applies to port-a-cath.).  30 g  0   No current facility-administered medications for this encounter.    Physical Findings: The patient is in no acute distress. Patient is alert and oriented.  weight is 107 lb 1.6 oz (48.58 kg). Her oral temperature is 97.8 F (36.6 C). Her blood pressure is 119/76 and her pulse is 96. Her respiration is 20 and oxygen saturation is 99%. .    General: Alert and oriented, in no acute distress HEENT: Head is normocephalic. Pupils are equally round and reactive to light. Extraocular movements are intact. Oropharynx is clear. Neck: Neck is supple, no palpable cervical or supraclavicular lymphadenopathy. Heart: Regular in rate and rhythm with no murmurs, rubs, or gallops. Chest: Clear to auscultation bilaterally, with no rhonchi, wheezes, or rales. Extremities: No cyanosis or edema. Finger to nose testing intact. Gait is Musculoskeletal: symmetric strength and muscle tone throughout. Neurologic: Cranial nerves II through XII are grossly intact. No obvious focalities. Speech is fluent. Coordination is intact. Finger to nose testing intact. No numbness in her extremities. As she walks some hallway, she does veer  slightly to her right at one point but recorrects    Lab Findings: Lab Results  Component Value Date   WBC 3.5* 02/22/2013   HGB 12.7 02/22/2013   HCT 37.7 02/22/2013   MCV 87.7 02/22/2013   PLT 257 02/22/2013    Radiographic Findings: [name] PN Contrast  02/23/2013   CLINICAL DATA:  S RS restaging.  Metastatic lung cancer.  EXAM: MRI HEAD WITHOUT AND WITH CONTRAST  TECHNIQUE: Multiplanar, multiecho pulse sequences of the brain and surrounding  structures were obtained without and with intravenous contrast.  CONTRAST:  91mL MULTIHANCE GADOBENATE DIMEGLUMINE 529 MG/ML IV SOLN  COMPARISON:  11/15/2012.  07/14/2012.  FINDINGS: There are no new or progressive lesions. Of all the previously visible metastases, the only focus presently visible is a 1.5 mm focus at the right parietal vertex, unchanged since the previous study. No edema. No mass effect or hemorrhage.  The remainder the brain appears normal. No evidence of ischemic infarction. No hydrocephalus or extra-axial collection.  No pituitary mass. No inflammatory sinus disease. No skull or skullbase lesion.  IMPRESSION: No new or progressive lesions. The only identifiable focus of enhancement is a 1.5 mm focus in the right parietal vertex region, unchanged since the previous study. This was the site of the largest lesion in the June which measured 10 mm at that time.   Electronically Signed   By: Nelson Chimes M.D.   On: 02/23/2013 14:47    Impression/Plan:  Doing well from a CNS/ neuro standpoint. F/u with brain MRI in 3 months.  She knows to call if any concerns or new symptoms arise before then. I introduced her to Wadie Lessen, NP of palliative care today so they are acquainted and can work together if her services would help Ms. Tolliver achieve ideal symptom management and QOL.   I spent 15 minutes face to face with the patient and more than 50% of that time was spent in counseling and/or coordination of care. _____________________________________   Eppie Gibson, MD

## 2013-03-01 ENCOUNTER — Other Ambulatory Visit (HOSPITAL_BASED_OUTPATIENT_CLINIC_OR_DEPARTMENT_OTHER): Payer: Medicare Other

## 2013-03-01 ENCOUNTER — Encounter: Payer: Self-pay | Admitting: Internal Medicine

## 2013-03-01 ENCOUNTER — Telehealth: Payer: Self-pay | Admitting: Internal Medicine

## 2013-03-01 ENCOUNTER — Ambulatory Visit (HOSPITAL_BASED_OUTPATIENT_CLINIC_OR_DEPARTMENT_OTHER): Payer: Medicare Other

## 2013-03-01 ENCOUNTER — Ambulatory Visit (HOSPITAL_BASED_OUTPATIENT_CLINIC_OR_DEPARTMENT_OTHER): Payer: Medicare Other | Admitting: Internal Medicine

## 2013-03-01 ENCOUNTER — Telehealth: Payer: Self-pay | Admitting: *Deleted

## 2013-03-01 VITALS — BP 115/80 | HR 90 | Temp 97.7°F | Resp 17 | Ht 62.0 in | Wt 107.1 lb

## 2013-03-01 DIAGNOSIS — C3490 Malignant neoplasm of unspecified part of unspecified bronchus or lung: Secondary | ICD-10-CM

## 2013-03-01 DIAGNOSIS — C787 Secondary malignant neoplasm of liver and intrahepatic bile duct: Secondary | ICD-10-CM

## 2013-03-01 DIAGNOSIS — Z5111 Encounter for antineoplastic chemotherapy: Secondary | ICD-10-CM

## 2013-03-01 DIAGNOSIS — C7952 Secondary malignant neoplasm of bone marrow: Secondary | ICD-10-CM

## 2013-03-01 DIAGNOSIS — C7951 Secondary malignant neoplasm of bone: Secondary | ICD-10-CM

## 2013-03-01 DIAGNOSIS — C3492 Malignant neoplasm of unspecified part of left bronchus or lung: Secondary | ICD-10-CM

## 2013-03-01 LAB — CBC WITH DIFFERENTIAL/PLATELET
BASO%: 0.8 % (ref 0.0–2.0)
Basophils Absolute: 0 10*3/uL (ref 0.0–0.1)
EOS%: 2.4 % (ref 0.0–7.0)
Eosinophils Absolute: 0.1 10*3/uL (ref 0.0–0.5)
HCT: 35.7 % (ref 34.8–46.6)
HGB: 12 g/dL (ref 11.6–15.9)
LYMPH#: 0.7 10*3/uL — AB (ref 0.9–3.3)
LYMPH%: 21.7 % (ref 14.0–49.7)
MCH: 29.6 pg (ref 25.1–34.0)
MCHC: 33.7 g/dL (ref 31.5–36.0)
MCV: 87.9 fL (ref 79.5–101.0)
MONO#: 0.6 10*3/uL (ref 0.1–0.9)
MONO%: 18.5 % — ABNORMAL HIGH (ref 0.0–14.0)
NEUT#: 1.8 10*3/uL (ref 1.5–6.5)
NEUT%: 56.6 % (ref 38.4–76.8)
Platelets: 191 10*3/uL (ref 145–400)
RBC: 4.06 10*6/uL (ref 3.70–5.45)
RDW: 14.2 % (ref 11.2–14.5)
WBC: 3.1 10*3/uL — ABNORMAL LOW (ref 3.9–10.3)

## 2013-03-01 LAB — COMPREHENSIVE METABOLIC PANEL (CC13)
ALBUMIN: 3.5 g/dL (ref 3.5–5.0)
ALT: 13 U/L (ref 0–55)
AST: 17 U/L (ref 5–34)
Alkaline Phosphatase: 107 U/L (ref 40–150)
Anion Gap: 8 mEq/L (ref 3–11)
BUN: 16.9 mg/dL (ref 7.0–26.0)
CALCIUM: 9.5 mg/dL (ref 8.4–10.4)
CO2: 27 mEq/L (ref 22–29)
Chloride: 103 mEq/L (ref 98–109)
Creatinine: 0.9 mg/dL (ref 0.6–1.1)
Glucose: 50 mg/dl — ABNORMAL LOW (ref 70–140)
POTASSIUM: 4.4 meq/L (ref 3.5–5.1)
Sodium: 138 mEq/L (ref 136–145)
Total Bilirubin: 0.23 mg/dL (ref 0.20–1.20)
Total Protein: 6.2 g/dL — ABNORMAL LOW (ref 6.4–8.3)

## 2013-03-01 LAB — MAGNESIUM (CC13): Magnesium: 1.9 mg/dl (ref 1.5–2.5)

## 2013-03-01 MED ORDER — IRINOTECAN HCL CHEMO INJECTION 100 MG/5ML
65.0000 mg/m2 | Freq: Once | INTRAVENOUS | Status: AC
  Administered 2013-03-01: 94 mg via INTRAVENOUS
  Filled 2013-03-01: qty 4.7

## 2013-03-01 MED ORDER — PALONOSETRON HCL INJECTION 0.25 MG/5ML
0.2500 mg | Freq: Once | INTRAVENOUS | Status: AC
Start: 2013-03-01 — End: 2013-03-01
  Administered 2013-03-01: 0.25 mg via INTRAVENOUS

## 2013-03-01 MED ORDER — SODIUM CHLORIDE 0.9 % IV SOLN
Freq: Once | INTRAVENOUS | Status: AC
  Administered 2013-03-01: 10:00:00 via INTRAVENOUS

## 2013-03-01 MED ORDER — HEPARIN SOD (PORK) LOCK FLUSH 100 UNIT/ML IV SOLN
500.0000 [IU] | Freq: Once | INTRAVENOUS | Status: AC | PRN
  Administered 2013-03-01: 500 [IU]
  Filled 2013-03-01: qty 5

## 2013-03-01 MED ORDER — ATROPINE SULFATE 1 MG/ML IJ SOLN
0.5000 mg | Freq: Once | INTRAMUSCULAR | Status: AC | PRN
  Administered 2013-03-01: 0.5 mg via INTRAVENOUS

## 2013-03-01 MED ORDER — POTASSIUM CHLORIDE 2 MEQ/ML IV SOLN
Freq: Once | INTRAVENOUS | Status: AC
  Administered 2013-03-01: 10:00:00 via INTRAVENOUS
  Filled 2013-03-01: qty 10

## 2013-03-01 MED ORDER — DEXAMETHASONE SODIUM PHOSPHATE 20 MG/5ML IJ SOLN
INTRAMUSCULAR | Status: AC
  Filled 2013-03-01: qty 5

## 2013-03-01 MED ORDER — SODIUM CHLORIDE 0.9 % IJ SOLN
10.0000 mL | INTRAMUSCULAR | Status: DC | PRN
  Administered 2013-03-01: 10 mL
  Filled 2013-03-01: qty 10

## 2013-03-01 MED ORDER — SODIUM CHLORIDE 0.9 % IV SOLN
150.0000 mg | Freq: Once | INTRAVENOUS | Status: AC
  Administered 2013-03-01: 150 mg via INTRAVENOUS
  Filled 2013-03-01: qty 5

## 2013-03-01 MED ORDER — PALONOSETRON HCL INJECTION 0.25 MG/5ML
INTRAVENOUS | Status: AC
  Filled 2013-03-01: qty 5

## 2013-03-01 MED ORDER — SODIUM CHLORIDE 0.9 % IV SOLN
30.0000 mg/m2 | Freq: Once | INTRAVENOUS | Status: AC
  Administered 2013-03-01: 44 mg via INTRAVENOUS
  Filled 2013-03-01: qty 44

## 2013-03-01 MED ORDER — DEXAMETHASONE SODIUM PHOSPHATE 20 MG/5ML IJ SOLN
12.0000 mg | Freq: Once | INTRAMUSCULAR | Status: AC
  Administered 2013-03-01: 12 mg via INTRAVENOUS

## 2013-03-01 MED ORDER — ATROPINE SULFATE 1 MG/ML IJ SOLN
INTRAMUSCULAR | Status: AC
  Filled 2013-03-01: qty 1

## 2013-03-01 NOTE — Telephone Encounter (Signed)
Per staff message and POF I have scheduled appts. Patient has CT scan scheduled 3/5 in afternoon, moved treatment to 3/6. Scheduler advised JMW

## 2013-03-01 NOTE — Patient Instructions (Signed)
[place] Discharge Instructions for Patients Receiving Chemotherapy  Today you received the following chemotherapy agents Irinotecan/Cisplatin.  To help prevent nausea and vomiting after your treatment, we encourage you to take your nausea medication as prescribed.   If you develop nausea and vomiting that is not controlled by your nausea medication, call the clinic.   BELOW ARE SYMPTOMS THAT SHOULD BE REPORTED IMMEDIATELY:  *FEVER GREATER THAN 100.5 F  *CHILLS WITH OR WITHOUT FEVER  NAUSEA AND VOMITING THAT IS NOT CONTROLLED WITH YOUR NAUSEA MEDICATION  *UNUSUAL SHORTNESS OF BREATH  *UNUSUAL BRUISING OR BLEEDING  TENDERNESS IN MOUTH AND THROAT WITH OR WITHOUT PRESENCE OF ULCERS  *URINARY PROBLEMS  *BOWEL PROBLEMS  UNUSUAL RASH Items with * indicate a potential emergency and should be followed up as soon as possible.  Feel free to call the clinic you have any questions or concerns. The clinic phone number is [phone].

## 2013-03-01 NOTE — Patient Instructions (Signed)
Continue chemotherapy today as scheduled.  Followup visit in 3 weeks with repeat CT scan of the chest, abdomen and pelvis.

## 2013-03-01 NOTE — Progress Notes (Signed)
[facility] [phone]   [phone]  OFFICE PROGRESS NOTE   DIAGNOSIS AND STAGE: Extensive stage small cell lung cancer with large obstructing Center left lung mass with large left pleural effusion, liver and brain metastasis diagnosed in June of 2014   PRIOR THERAPY:  1) Status post whole brain irradiation under the care of Dr. Isidore Moos completed 09/11/2012.  2) whole brain irradiation under the care of Dr. Isidore Moos expected to be completed on 09/11/2012.  3) Systemic chemotherapy with carboplatin for AUC of 5 on day 1 and etoposide 120 mg/M2 on days 1, 2 and 3 with Neulasta support on day 4, status post 4 cycles, last cycle was given on 10/11/2012 with partial response.   CURRENT THERAPY:  Systemic chemotherapy with cisplatin 30 mg/M2 and irinotecan 65 mg/M2 on days 1 and 8 every 3 weeks. First dose 01/16/2013.  CHEMOTHERAPY INTENT: Palliative  CURRENT # OF CHEMOTHERAPY CYCLES: 1 CURRENT ANTIEMETICS: Zofran, dexamethasone and Compazine  CURRENT SMOKING STATUS: Former smoker  ORAL CHEMOTHERAPY AND CONSENT: None  CURRENT BISPHOSPHONATES USE: None  PAIN MANAGEMENT: 0/10 on Vicodin  NARCOTICS INDUCED CONSTIPATION: None  LIVING WILL AND CODE STATUS: no CODE BLUE   INTERVAL HISTORY: FIDENCIA MCCLOUD 73 y.o. female returns to the clinic today for follow up visit accompanied by her daughter. The patient is feeling much better today with no specific complaints. She denied having any significant chest pain, shortness breath, cough or hemoptysis. She has no weight loss or night sweats. She has no nausea or vomiting. She has no fever or chills. She was tolerating her systemic chemotherapy with cisplatin and irinotecan fairly well with no significant adverse effects.  MEDICAL HISTORY: Past Medical History  Diagnosis Date  . Pneumonia, organism unspecified   . 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 Status post chemotherapy   10/11/2012     carboplatin  . S/P radiation therapy 08/24/2012-09/11/2012    Whole Brain  / 32.5 Gy in 13 fractions    ALLERGIES:  is allergic to asa; codeine; and tramadol.  MEDICATIONS:  Current Outpatient Prescriptions  Medication Sig Dispense Refill  . acetaminophen (TYLENOL) 500 MG tablet Take 1,000 mg by mouth every 6 (six) hours as needed (For headache.).      Marland Kitchen esomeprazole (NEXIUM) 10 MG packet Take 10 mg by mouth daily before breakfast.      . glucosamine-chondroitin 500-400 MG tablet Take 1 tablet by mouth daily with lunch.       . lidocaine-prilocaine (EMLA) cream Apply 1 application topically daily as needed (Applies to port-a-cath.).  30 g  0  . Multiple Vitamin (MULTIVITAMIN WITH MINERALS) TABS Take 1 tablet by mouth daily with lunch. She takes Dance movement psychotherapist Adult 50+.      . Omega-3 Fatty Acids (FISH OIL CONCENTRATE PO) Take by mouth. Pt unsure of dose, takes daily       No current facility-administered medications for this visit.    SURGICAL HISTORY:  Past Surgical History  Procedure Laterality Date  . Nasal sinus surgery    . Video bronchoscopy Bilateral 07/05/2012    Procedure: VIDEO BRONCHOSCOPY WITHOUT FLUORO;  Surgeon: Tanda Rockers, MD;  Location: [facility];  Service: Cardiopulmonary;  Laterality: Bilateral;  . Portacath placement      REVIEW OF SYSTEMS:  Constitutional: positive for anorexia, fatigue and weight loss Eyes: negative Ears, nose, mouth, throat, and face: negative Respiratory: positive for dyspnea on exertion Cardiovascular:  negative Gastrointestinal: negative Genitourinary:negative Integument/breast: negative Hematologic/lymphatic: negative Musculoskeletal:negative Neurological: negative Behavioral/Psych: negative Endocrine: negative Allergic/Immunologic: negative   PHYSICAL EXAMINATION: General appearance: alert, cooperative, fatigued and no distress Head: Normocephalic, without obvious abnormality, atraumatic Neck: no adenopathy,  no JVD, supple, symmetrical, trachea midline and thyroid not enlarged, symmetric, no tenderness/mass/nodules Lymph nodes: Cervical, supraclavicular, and axillary nodes normal. Resp: clear to auscultation bilaterally Back: symmetric, no curvature. ROM normal. No CVA tenderness. Cardio: regular rate and rhythm, S1, S2 normal, no murmur, click, rub or gallop GI: soft, non-tender; bowel sounds normal; no masses,  no organomegaly Extremities: extremities normal, atraumatic, no cyanosis or edema Neurologic: Alert and oriented X 3, normal strength and tone. Normal symmetric reflexes. Normal coordination and gait  ECOG PERFORMANCE STATUS: 1 - Symptomatic but completely ambulatory  Blood pressure 115/80, pulse 90, temperature 97.7 F (36.5 C), temperature source Oral, resp. rate 17, height 5\' 2"  (1.575 m), weight 107 lb 1.6 oz (48.58 kg), SpO2 96.00%.  LABORATORY DATA: Lab Results  Component Value Date   WBC 3.1* 03/01/2013   HGB 12.0 03/01/2013   HCT 35.7 03/01/2013   MCV 87.9 03/01/2013   PLT 191 03/01/2013      Chemistry      Component Value Date/Time   NA 136 02/22/2013 0932   NA 132* 11/17/2012 1228   K 4.2 02/22/2013 0932   K 4.6 11/17/2012 1228   CL 96 11/17/2012 1228   CL 93* 07/10/2012 1544   CO2 28 02/22/2013 0932   CO2 26 11/17/2012 1228   BUN 19.0 02/22/2013 0932   BUN 16 11/17/2012 1228   CREATININE 1.0 02/22/2013 0932   CREATININE 1.05 11/17/2012 1228      Component Value Date/Time   CALCIUM 9.3 02/22/2013 0932   CALCIUM 9.9 11/17/2012 1228   ALKPHOS 103 02/22/2013 0932   AST 16 02/22/2013 0932   ALT 20 02/22/2013 0932   BILITOT 0.23 02/22/2013 0932       RADIOGRAPHIC STUDIES:   ASSESSMENT AND PLAN: this is a very pleasant 73 years old white female with extensive stage small cell lung cancer status post 4 cycles of systemic chemotherapy with carboplatin and etoposide with significant improvement in her disease. The patient is doing fine today and tolerating her treatment with  cisplatin and irinotecan well. I recommended for her to proceed with cycle #3 today as scheduled. She would come back for followup visit in 3 weeks with repeat CT scan of the chest, abdomen and pelvis for restaging of her disease. She was advised to call immediately if she has any concerning symptoms in the interval. The patient voices understanding of current disease status and treatment options and is in agreement with the current care plan.  All questions were answered. The patient knows to call the clinic with any problems, questions or concerns. We can certainly see the patient much sooner if necessary.  Disclaimer: This note was dictated with voice recognition software. Similar sounding words can inadvertently be transcribed and may not be corrected upon review.

## 2013-03-01 NOTE — Telephone Encounter (Signed)
Gave pt appt for lab and MD, emailed Sharyn Lull regarding chemo for March 2015

## 2013-03-02 ENCOUNTER — Telehealth: Payer: Self-pay | Admitting: Internal Medicine

## 2013-03-02 NOTE — Telephone Encounter (Signed)
Pt aware of appt for lab,md and chemo for February and MArch 2015

## 2013-03-08 ENCOUNTER — Other Ambulatory Visit (HOSPITAL_BASED_OUTPATIENT_CLINIC_OR_DEPARTMENT_OTHER): Payer: Medicare Other

## 2013-03-08 ENCOUNTER — Ambulatory Visit (HOSPITAL_BASED_OUTPATIENT_CLINIC_OR_DEPARTMENT_OTHER): Payer: Medicare Other

## 2013-03-08 VITALS — BP 152/76 | HR 79 | Temp 97.4°F | Resp 18

## 2013-03-08 DIAGNOSIS — C3490 Malignant neoplasm of unspecified part of unspecified bronchus or lung: Secondary | ICD-10-CM

## 2013-03-08 DIAGNOSIS — Z5111 Encounter for antineoplastic chemotherapy: Secondary | ICD-10-CM

## 2013-03-08 DIAGNOSIS — C787 Secondary malignant neoplasm of liver and intrahepatic bile duct: Secondary | ICD-10-CM

## 2013-03-08 DIAGNOSIS — C7952 Secondary malignant neoplasm of bone marrow: Secondary | ICD-10-CM

## 2013-03-08 DIAGNOSIS — C3492 Malignant neoplasm of unspecified part of left bronchus or lung: Secondary | ICD-10-CM

## 2013-03-08 DIAGNOSIS — C7951 Secondary malignant neoplasm of bone: Secondary | ICD-10-CM

## 2013-03-08 LAB — CBC WITH DIFFERENTIAL/PLATELET
BASO%: 0.6 % (ref 0.0–2.0)
Basophils Absolute: 0 10*3/uL (ref 0.0–0.1)
EOS ABS: 0 10*3/uL (ref 0.0–0.5)
EOS%: 1.4 % (ref 0.0–7.0)
HCT: 36.2 % (ref 34.8–46.6)
HGB: 12.3 g/dL (ref 11.6–15.9)
LYMPH%: 26.9 % (ref 14.0–49.7)
MCH: 29.5 pg (ref 25.1–34.0)
MCHC: 34.1 g/dL (ref 31.5–36.0)
MCV: 86.6 fL (ref 79.5–101.0)
MONO#: 0.4 10*3/uL (ref 0.1–0.9)
MONO%: 14.2 % — AB (ref 0.0–14.0)
NEUT%: 56.9 % (ref 38.4–76.8)
NEUTROS ABS: 1.5 10*3/uL (ref 1.5–6.5)
PLATELETS: 155 10*3/uL (ref 145–400)
RBC: 4.18 10*6/uL (ref 3.70–5.45)
RDW: 15 % — ABNORMAL HIGH (ref 11.2–14.5)
WBC: 2.6 10*3/uL — ABNORMAL LOW (ref 3.9–10.3)
lymph#: 0.7 10*3/uL — ABNORMAL LOW (ref 0.9–3.3)

## 2013-03-08 LAB — COMPREHENSIVE METABOLIC PANEL (CC13)
ALT: 18 U/L (ref 0–55)
AST: 18 U/L (ref 5–34)
Albumin: 3.7 g/dL (ref 3.5–5.0)
Alkaline Phosphatase: 105 U/L (ref 40–150)
Anion Gap: 10 mEq/L (ref 3–11)
BILIRUBIN TOTAL: 0.31 mg/dL (ref 0.20–1.20)
BUN: 17.4 mg/dL (ref 7.0–26.0)
CO2: 26 mEq/L (ref 22–29)
CREATININE: 0.9 mg/dL (ref 0.6–1.1)
Calcium: 9.7 mg/dL (ref 8.4–10.4)
Chloride: 98 mEq/L (ref 98–109)
GLUCOSE: 57 mg/dL — AB (ref 70–140)
Potassium: 4.5 mEq/L (ref 3.5–5.1)
SODIUM: 134 meq/L — AB (ref 136–145)
TOTAL PROTEIN: 6.5 g/dL (ref 6.4–8.3)

## 2013-03-08 LAB — MAGNESIUM (CC13): MAGNESIUM: 1.8 mg/dL (ref 1.5–2.5)

## 2013-03-08 MED ORDER — ATROPINE SULFATE 1 MG/ML IJ SOLN
INTRAMUSCULAR | Status: AC
  Filled 2013-03-08: qty 1

## 2013-03-08 MED ORDER — SODIUM CHLORIDE 0.9 % IV SOLN
150.0000 mg | Freq: Once | INTRAVENOUS | Status: AC
  Administered 2013-03-08: 150 mg via INTRAVENOUS
  Filled 2013-03-08: qty 5

## 2013-03-08 MED ORDER — DEXAMETHASONE SODIUM PHOSPHATE 20 MG/5ML IJ SOLN
12.0000 mg | Freq: Once | INTRAMUSCULAR | Status: AC
  Administered 2013-03-08: 12 mg via INTRAVENOUS

## 2013-03-08 MED ORDER — PALONOSETRON HCL INJECTION 0.25 MG/5ML
0.2500 mg | Freq: Once | INTRAVENOUS | Status: AC
  Administered 2013-03-08: 0.25 mg via INTRAVENOUS

## 2013-03-08 MED ORDER — POTASSIUM CHLORIDE 2 MEQ/ML IV SOLN
Freq: Once | INTRAVENOUS | Status: AC
  Administered 2013-03-08: 09:00:00 via INTRAVENOUS
  Filled 2013-03-08: qty 10

## 2013-03-08 MED ORDER — PALONOSETRON HCL INJECTION 0.25 MG/5ML
INTRAVENOUS | Status: AC
  Filled 2013-03-08: qty 5

## 2013-03-08 MED ORDER — SODIUM CHLORIDE 0.9 % IV SOLN
30.0000 mg/m2 | Freq: Once | INTRAVENOUS | Status: AC
  Administered 2013-03-08: 44 mg via INTRAVENOUS
  Filled 2013-03-08: qty 44

## 2013-03-08 MED ORDER — ATROPINE SULFATE 1 MG/ML IJ SOLN
0.5000 mg | Freq: Once | INTRAMUSCULAR | Status: AC | PRN
  Administered 2013-03-08: 0.5 mg via INTRAVENOUS

## 2013-03-08 MED ORDER — SODIUM CHLORIDE 0.9 % IJ SOLN
10.0000 mL | INTRAMUSCULAR | Status: DC | PRN
  Administered 2013-03-08: 10 mL
  Filled 2013-03-08: qty 10

## 2013-03-08 MED ORDER — DEXAMETHASONE SODIUM PHOSPHATE 20 MG/5ML IJ SOLN
INTRAMUSCULAR | Status: AC
  Filled 2013-03-08: qty 5

## 2013-03-08 MED ORDER — DEXTROSE 5 % IV SOLN
65.0000 mg/m2 | Freq: Once | INTRAVENOUS | Status: AC
  Administered 2013-03-08: 94 mg via INTRAVENOUS
  Filled 2013-03-08: qty 4.7

## 2013-03-08 MED ORDER — HEPARIN SOD (PORK) LOCK FLUSH 100 UNIT/ML IV SOLN
500.0000 [IU] | Freq: Once | INTRAVENOUS | Status: AC | PRN
  Administered 2013-03-08: 500 [IU]
  Filled 2013-03-08: qty 5

## 2013-03-08 MED ORDER — SODIUM CHLORIDE 0.9 % IV SOLN
Freq: Once | INTRAVENOUS | Status: AC
  Administered 2013-03-08: 09:00:00 via INTRAVENOUS

## 2013-03-08 NOTE — Patient Instructions (Signed)
[facility] Discharge Instructions for Patients Receiving Chemotherapy  Today you received the following chemotherapy agents irinotecan, cisplatin.   To help prevent nausea and vomiting after your treatment, we encourage you to take your nausea medication as prescribed.    If you develop nausea and vomiting that is not controlled by your nausea medication, call the clinic.   BELOW ARE SYMPTOMS THAT SHOULD BE REPORTED IMMEDIATELY:  *FEVER GREATER THAN 100.5 F  *CHILLS WITH OR WITHOUT FEVER  NAUSEA AND VOMITING THAT IS NOT CONTROLLED WITH YOUR NAUSEA MEDICATION  *UNUSUAL SHORTNESS OF BREATH  *UNUSUAL BRUISING OR BLEEDING  TENDERNESS IN MOUTH AND THROAT WITH OR WITHOUT PRESENCE OF ULCERS  *URINARY PROBLEMS  *BOWEL PROBLEMS  UNUSUAL RASH Items with * indicate a potential emergency and should be followed up as soon as possible.  Feel free to call the clinic should you have any questions or concerns. The clinic phone number is [phone].  It was my pleasure to take care of you today!  Leeanne Rio, RN

## 2013-03-13 ENCOUNTER — Other Ambulatory Visit: Payer: Self-pay | Admitting: Radiation Therapy

## 2013-03-13 DIAGNOSIS — C7949 Secondary malignant neoplasm of other parts of nervous system: Principal | ICD-10-CM

## 2013-03-13 DIAGNOSIS — C7931 Secondary malignant neoplasm of brain: Secondary | ICD-10-CM

## 2013-03-15 ENCOUNTER — Telehealth: Payer: Self-pay | Admitting: Internal Medicine

## 2013-03-15 ENCOUNTER — Other Ambulatory Visit: Payer: Medicare Other

## 2013-03-15 NOTE — Telephone Encounter (Signed)
pt called to r/s lab to 2.27.15....done....pt aware of new d.t

## 2013-03-16 ENCOUNTER — Other Ambulatory Visit: Payer: Medicare Other

## 2013-03-16 ENCOUNTER — Telehealth: Payer: Self-pay | Admitting: Medical Oncology

## 2013-03-16 NOTE — Telephone Encounter (Signed)
Confirmed importance of lab appt today.

## 2013-03-16 NOTE — Telephone Encounter (Signed)
Cannot get in today for labs.r/s to next monday

## 2013-03-19 ENCOUNTER — Other Ambulatory Visit (HOSPITAL_BASED_OUTPATIENT_CLINIC_OR_DEPARTMENT_OTHER): Payer: Medicare Other

## 2013-03-19 DIAGNOSIS — C3492 Malignant neoplasm of unspecified part of left bronchus or lung: Secondary | ICD-10-CM

## 2013-03-19 DIAGNOSIS — C3490 Malignant neoplasm of unspecified part of unspecified bronchus or lung: Secondary | ICD-10-CM

## 2013-03-19 LAB — CBC WITH DIFFERENTIAL/PLATELET
BASO%: 0.4 % (ref 0.0–2.0)
Basophils Absolute: 0 10*3/uL (ref 0.0–0.1)
EOS ABS: 0 10*3/uL (ref 0.0–0.5)
EOS%: 0.7 % (ref 0.0–7.0)
HEMATOCRIT: 32.8 % — AB (ref 34.8–46.6)
HEMOGLOBIN: 10.9 g/dL — AB (ref 11.6–15.9)
LYMPH%: 22.5 % (ref 14.0–49.7)
MCH: 29.2 pg (ref 25.1–34.0)
MCHC: 33.2 g/dL (ref 31.5–36.0)
MCV: 88 fL (ref 79.5–101.0)
MONO#: 0.6 10*3/uL (ref 0.1–0.9)
MONO%: 22.5 % — ABNORMAL HIGH (ref 0.0–14.0)
NEUT%: 53.9 % (ref 38.4–76.8)
NEUTROS ABS: 1.3 10*3/uL — AB (ref 1.5–6.5)
Platelets: 225 10*3/uL (ref 145–400)
RBC: 3.73 10*6/uL (ref 3.70–5.45)
RDW: 16.5 % — ABNORMAL HIGH (ref 11.2–14.5)
WBC: 2.4 10*3/uL — ABNORMAL LOW (ref 3.9–10.3)
lymph#: 0.6 10*3/uL — ABNORMAL LOW (ref 0.9–3.3)

## 2013-03-19 LAB — COMPREHENSIVE METABOLIC PANEL (CC13)
ALBUMIN: 3.6 g/dL (ref 3.5–5.0)
ALT: 16 U/L (ref 0–55)
ANION GAP: 5 meq/L (ref 3–11)
AST: 19 U/L (ref 5–34)
Alkaline Phosphatase: 96 U/L (ref 40–150)
BUN: 13.7 mg/dL (ref 7.0–26.0)
CHLORIDE: 102 meq/L (ref 98–109)
CO2: 27 meq/L (ref 22–29)
CREATININE: 0.9 mg/dL (ref 0.6–1.1)
Calcium: 9.2 mg/dL (ref 8.4–10.4)
Glucose: 68 mg/dl — ABNORMAL LOW (ref 70–140)
POTASSIUM: 4.6 meq/L (ref 3.5–5.1)
Sodium: 133 mEq/L — ABNORMAL LOW (ref 136–145)
Total Bilirubin: 0.25 mg/dL (ref 0.20–1.20)
Total Protein: 6.2 g/dL — ABNORMAL LOW (ref 6.4–8.3)

## 2013-03-19 LAB — MAGNESIUM (CC13): Magnesium: 1.6 mg/dl (ref 1.5–2.5)

## 2013-03-22 ENCOUNTER — Encounter (HOSPITAL_COMMUNITY): Payer: Self-pay

## 2013-03-22 ENCOUNTER — Ambulatory Visit (HOSPITAL_COMMUNITY)
Admission: RE | Admit: 2013-03-22 | Discharge: 2013-03-22 | Disposition: A | Discharge status: Home / Self Care | Payer: Medicare Other | Source: Ambulatory Visit | Attending: Internal Medicine | Admitting: Internal Medicine

## 2013-03-22 ENCOUNTER — Other Ambulatory Visit: Payer: Self-pay | Admitting: *Deleted

## 2013-03-22 ENCOUNTER — Other Ambulatory Visit: Payer: Medicare Other

## 2013-03-22 DIAGNOSIS — C7952 Secondary malignant neoplasm of bone marrow: Secondary | ICD-10-CM

## 2013-03-22 DIAGNOSIS — C787 Secondary malignant neoplasm of liver and intrahepatic bile duct: Secondary | ICD-10-CM | POA: Insufficient documentation

## 2013-03-22 DIAGNOSIS — C7949 Secondary malignant neoplasm of other parts of nervous system: Secondary | ICD-10-CM

## 2013-03-22 DIAGNOSIS — C3490 Malignant neoplasm of unspecified part of unspecified bronchus or lung: Secondary | ICD-10-CM | POA: Insufficient documentation

## 2013-03-22 DIAGNOSIS — C7951 Secondary malignant neoplasm of bone: Secondary | ICD-10-CM | POA: Insufficient documentation

## 2013-03-22 DIAGNOSIS — C7931 Secondary malignant neoplasm of brain: Secondary | ICD-10-CM | POA: Insufficient documentation

## 2013-03-22 MED ORDER — IOHEXOL 300 MG/ML  SOLN
100.0000 mL | Freq: Once | INTRAMUSCULAR | Status: AC | PRN
  Administered 2013-03-22: 100 mL via INTRAVENOUS

## 2013-03-22 MED ORDER — IOHEXOL 300 MG/ML  SOLN
50.0000 mL | Freq: Once | INTRAMUSCULAR | Status: AC | PRN
  Administered 2013-03-22: 50 mL via ORAL

## 2013-03-23 ENCOUNTER — Telehealth: Payer: Self-pay | Admitting: Internal Medicine

## 2013-03-23 ENCOUNTER — Other Ambulatory Visit: Payer: Self-pay | Admitting: *Deleted

## 2013-03-23 ENCOUNTER — Ambulatory Visit: Payer: Medicare Other

## 2013-03-23 ENCOUNTER — Ambulatory Visit (HOSPITAL_BASED_OUTPATIENT_CLINIC_OR_DEPARTMENT_OTHER): Payer: Medicare Other | Admitting: Internal Medicine

## 2013-03-23 ENCOUNTER — Telehealth: Payer: Self-pay | Admitting: *Deleted

## 2013-03-23 ENCOUNTER — Other Ambulatory Visit (HOSPITAL_BASED_OUTPATIENT_CLINIC_OR_DEPARTMENT_OTHER): Payer: Medicare Other

## 2013-03-23 ENCOUNTER — Encounter: Payer: Self-pay | Admitting: Internal Medicine

## 2013-03-23 ENCOUNTER — Ambulatory Visit (HOSPITAL_BASED_OUTPATIENT_CLINIC_OR_DEPARTMENT_OTHER): Payer: Medicare Other

## 2013-03-23 VITALS — BP 138/80 | HR 80 | Temp 97.8°F | Resp 18 | Ht 62.0 in | Wt 110.6 lb

## 2013-03-23 DIAGNOSIS — C3492 Malignant neoplasm of unspecified part of left bronchus or lung: Secondary | ICD-10-CM

## 2013-03-23 DIAGNOSIS — D702 Other drug-induced agranulocytosis: Secondary | ICD-10-CM | POA: Insufficient documentation

## 2013-03-23 DIAGNOSIS — C7949 Secondary malignant neoplasm of other parts of nervous system: Secondary | ICD-10-CM

## 2013-03-23 DIAGNOSIS — C7931 Secondary malignant neoplasm of brain: Secondary | ICD-10-CM

## 2013-03-23 DIAGNOSIS — C3490 Malignant neoplasm of unspecified part of unspecified bronchus or lung: Secondary | ICD-10-CM

## 2013-03-23 DIAGNOSIS — C787 Secondary malignant neoplasm of liver and intrahepatic bile duct: Secondary | ICD-10-CM

## 2013-03-23 DIAGNOSIS — D709 Neutropenia, unspecified: Secondary | ICD-10-CM

## 2013-03-23 LAB — CBC WITH DIFFERENTIAL/PLATELET
BASO%: 0.9 % (ref 0.0–2.0)
Basophils Absolute: 0 10*3/uL (ref 0.0–0.1)
EOS%: 1.3 % (ref 0.0–7.0)
Eosinophils Absolute: 0 10*3/uL (ref 0.0–0.5)
HCT: 31.5 % — ABNORMAL LOW (ref 34.8–46.6)
HGB: 10.6 g/dL — ABNORMAL LOW (ref 11.6–15.9)
LYMPH#: 0.6 10*3/uL — AB (ref 0.9–3.3)
LYMPH%: 26.7 % (ref 14.0–49.7)
MCH: 29.6 pg (ref 25.1–34.0)
MCHC: 33.6 g/dL (ref 31.5–36.0)
MCV: 88.1 fL (ref 79.5–101.0)
MONO#: 0.7 10*3/uL (ref 0.1–0.9)
MONO%: 30 % — ABNORMAL HIGH (ref 0.0–14.0)
NEUT#: 0.9 10*3/uL — ABNORMAL LOW (ref 1.5–6.5)
NEUT%: 41.1 % (ref 38.4–76.8)
Platelets: 182 10*3/uL (ref 145–400)
RBC: 3.57 10*6/uL — ABNORMAL LOW (ref 3.70–5.45)
RDW: 17.5 % — AB (ref 11.2–14.5)
WBC: 2.3 10*3/uL — ABNORMAL LOW (ref 3.9–10.3)

## 2013-03-23 MED ORDER — FILGRASTIM 300 MCG/0.5ML IJ SOLN: 300.0000 ug | Freq: Once | INTRAMUSCULAR | Status: DC

## 2013-03-23 MED ORDER — TBO-FILGRASTIM 300 MCG/0.5ML ~~LOC~~ SOSY
300.0000 ug | PREFILLED_SYRINGE | Freq: Once | SUBCUTANEOUS | Status: AC
  Administered 2013-03-23: 300 ug via SUBCUTANEOUS
  Filled 2013-03-23: qty 0.5

## 2013-03-23 NOTE — Telephone Encounter (Signed)
Gave pt appt for lab, md and chemo for March and April 2015

## 2013-03-23 NOTE — Telephone Encounter (Signed)
Per staff message and POF I have scheduled appts.  JMW  

## 2013-03-23 NOTE — Progress Notes (Signed)
[place] Telephone:([phone]   Fax:([phone]) [phone]  OFFICE PROGRESS NOTE   DIAGNOSIS AND STAGE: Extensive stage small cell lung cancer with large obstructing Center left lung mass with large left pleural effusion, liver and brain metastasis diagnosed in June of 2014   PRIOR THERAPY:  1) Status post whole brain irradiation under the care of Dr. Isidore Moos completed 09/11/2012.  2) whole brain irradiation under the care of Dr. Isidore Moos expected to be completed on 09/11/2012.  3) Systemic chemotherapy with carboplatin for AUC of 5 on day 1 and etoposide 120 mg/M2 on days 1, 2 and 3 with Neulasta support on day 4, status post 4 cycles, last cycle was given on 10/11/2012 with partial response.   CURRENT THERAPY:  Systemic chemotherapy with cisplatin 30 mg/M2 and irinotecan 65 mg/M2 on days 1 and 8 every 3 weeks. First dose 01/16/2013.  CHEMOTHERAPY INTENT: Palliative  CURRENT # OF CHEMOTHERAPY CYCLES: 4 CURRENT ANTIEMETICS: Zofran, dexamethasone and Compazine  CURRENT SMOKING STATUS: Former smoker  ORAL CHEMOTHERAPY AND CONSENT: None  CURRENT BISPHOSPHONATES USE: None  PAIN MANAGEMENT: 0/10 on Vicodin  NARCOTICS INDUCED CONSTIPATION: None  LIVING WILL AND CODE STATUS: no CODE BLUE   INTERVAL HISTORY: ROZENA FIERRO 73 y.o. female returns to the clinic today for follow up visit accompanied by her friend. The patient is feeling much better today with no specific complaints. She denied having any significant chest pain, shortness of breath, cough or hemoptysis. She has no weight loss or night sweats. She has no nausea or vomiting. She has no fever or chills. She is tolerating her systemic chemotherapy with cisplatin and irinotecan fairly well with no significant adverse effects.she had repeat CT scan of the chest, abdomen and pelvis performed recently and she is here for evaluation and discussion of her scan results.  MEDICAL HISTORY: Past Medical History  Diagnosis Date    . Pneumonia, organism unspecified   . 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 Status post chemotherapy  10/11/2012     carboplatin  . S/P radiation therapy 08/24/2012-09/11/2012    Whole Brain  / 32.5 Gy in 13 fractions    ALLERGIES:  is allergic to asa; codeine; and tramadol.  MEDICATIONS:  Current Outpatient Prescriptions  Medication Sig Dispense Refill  . acetaminophen (TYLENOL) 500 MG tablet Take 1,000 mg by mouth every 6 (six) hours as needed (For headache.).      Marland Kitchen esomeprazole (NEXIUM) 10 MG packet Take 10 mg by mouth daily before breakfast.      . glucosamine-chondroitin 500-400 MG tablet Take 1 tablet by mouth daily with lunch.       . lidocaine-prilocaine (EMLA) cream Apply 1 application topically daily as needed (Applies to port-a-cath.).  30 g  0  . Multiple Vitamin (MULTIVITAMIN WITH MINERALS) TABS Take 1 tablet by mouth daily with lunch. She takes Dance movement psychotherapist Adult 50+.      . Omega-3 Fatty Acids (FISH OIL CONCENTRATE PO) Take by mouth. Pt unsure of dose, takes daily       No current facility-administered medications for this visit.    SURGICAL HISTORY:  Past Surgical History  Procedure Laterality Date  . Nasal sinus surgery    . Video bronchoscopy Bilateral 07/05/2012    Procedure: VIDEO BRONCHOSCOPY WITHOUT FLUORO;  Surgeon: Tanda Rockers, MD;  Location: [facility];  Service: Cardiopulmonary;  Laterality: Bilateral;  . Portacath placement      REVIEW OF  SYSTEMS:  Constitutional: positive for anorexia, fatigue and weight loss Eyes: negative Ears, nose, mouth, throat, and face: negative Respiratory: positive for dyspnea on exertion Cardiovascular: negative Gastrointestinal: negative Genitourinary:negative Integument/breast: negative Hematologic/lymphatic: negative Musculoskeletal:negative Neurological: negative Behavioral/Psych: negative Endocrine: negative Allergic/Immunologic: negative   PHYSICAL EXAMINATION: General  appearance: alert, cooperative, fatigued and no distress Head: Normocephalic, without obvious abnormality, atraumatic Neck: no adenopathy, no JVD, supple, symmetrical, trachea midline and thyroid not enlarged, symmetric, no tenderness/mass/nodules Lymph nodes: Cervical, supraclavicular, and axillary nodes normal. Resp: clear to auscultation bilaterally Back: symmetric, no curvature. ROM normal. No CVA tenderness. Cardio: regular rate and rhythm, S1, S2 normal, no murmur, click, rub or gallop GI: soft, non-tender; bowel sounds normal; no masses,  no organomegaly Extremities: extremities normal, atraumatic, no cyanosis or edema Neurologic: Alert and oriented X 3, normal strength and tone. Normal symmetric reflexes. Normal coordination and gait  ECOG PERFORMANCE STATUS: 1 - Symptomatic but completely ambulatory  Blood pressure 138/80, pulse 80, temperature 97.8 F (36.6 C), temperature source Oral, resp. rate 18, height 5\' 2"  (1.575 m), weight 110 lb 9.6 oz (50.168 kg), SpO2 98.00%.  LABORATORY DATA: Lab Results  Component Value Date   WBC 2.3* 03/23/2013   HGB 10.6* 03/23/2013   HCT 31.5* 03/23/2013   MCV 88.1 03/23/2013   PLT 182 03/23/2013      Chemistry      Component Value Date/Time   NA 133* 03/19/2013 1130   NA 132* 11/17/2012 1228   K 4.6 03/19/2013 1130   K 4.6 11/17/2012 1228   CL 96 11/17/2012 1228   CL 93* 07/10/2012 1544   CO2 27 03/19/2013 1130   CO2 26 11/17/2012 1228   BUN 13.7 03/19/2013 1130   BUN 16 11/17/2012 1228   CREATININE 0.9 03/19/2013 1130   CREATININE 1.05 11/17/2012 1228      Component Value Date/Time   CALCIUM 9.2 03/19/2013 1130   CALCIUM 9.9 11/17/2012 1228   ALKPHOS 96 03/19/2013 1130   AST 19 03/19/2013 1130   ALT 16 03/19/2013 1130   BILITOT 0.25 03/19/2013 1130       RADIOGRAPHIC STUDIES: Ct Chest W Contrast  03/22/2013   CLINICAL DATA:  Small cell lung cancer, metastatic to the brain.  EXAM: CT CHEST, ABDOMEN, AND PELVIS WITH CONTRAST  TECHNIQUE: Multidetector  CT imaging of the chest, abdomen and pelvis was performed following the standard protocol during bolus administration of intravenous contrast.  CONTRAST:  176mL OMNIPAQUE IOHEXOL 300 MG/ML  SOLN  COMPARISON:  CT ABD/PELVIS W CM dated 01/01/2013  FINDINGS:   CT CHEST FINDINGS  Small pericardial effusion, reduced in size from prior. Small left pleural effusion, stable, with subtle enhancement along the pleural margins but no pleural nodularity observed. Mildly elevated left hemidiaphragm. Thickened mid and distal esophageal walls.  Emphysema noted. Bandlike spiculated densities in the left upper lobe and lingula appear minimally improved compared to prior study, with the lingular band of density measuring 1 point 3 cm (formerly 1.6 cm) and with the more cephalad band of density measuring 8 mm in thickness (formerly a 11 mm). A component of the nodularity in the left upper lobe is improved or resolved, including the 1.1 cm pleural-based nodule along the left upper lobe. The clustered nodularity in the left lower lobe has nearly completely resolved, with only faint interstitial accentuation at the site of prior dense nodular airspace opacity.  Stable right apical pleural parenchymal scarring. The wall thickening around the left upper lobe bronchus appears worsened, with near complete  occlusion of the bronchus on image 24 of series 4. Mucus versus tumor noted distally in the left mainstem bronchus.  Sclerotic metastatic disease observed in the right scapula and at the T8, T9, and T12 levels, similar to prior. Stable right tenth rib sclerotic metastatic lesion.    CT ABDOMEN AND PELVIS FINDINGS  The spleen, pancreas, and gallbladder appear unremarkable.  Left adrenal mass 1.2 cm in short axis thickness (stable), image 50 of series 2. Right adrenal mass 1.5 cm in short axis thickness, previously 1.7 cm. Several hepatic cysts are noted. Hepatic metastatic lesion which previously measured 2.2 cm in diameter currently  measures 1.1 cm in diameter. Multiple hepatic hypodensities are technically too small to characterize. The bilateral hypodense renal lesions are likely cysts.  Aortoiliac atherosclerotic vascular disease noted. Reduced size of the left inguinal fluid density collection, currently 1.4 cm in short axis, formerly 2.1 cm.  Uterine and adnexal contours unremarkable. Sclerotic metastatic lesions at L1 through L4 at and in the bony pelvis and right femoral neck.    IMPRESSION: 1. General improvement compared to the prior exam. Dramatically reduced densities in the left lower lobe with reduced thickening of the bands of suspected tumor in the left upper lobe. Some of the left lower lobe opacity may have represented infection which is cleared. Dramatic reduction in size of the hepatic metastatic lesion with slight reduction in size of the right adrenal metastatic lesion. 2. Overall similar appearance and distribution of the osseous metastatic disease. 3. Mid and distal esophageal wall thickening, suspicious for esophagitis. 4. Of note, the localized wall thickening in the proximal left upper lobe bronchus is worsened, with near occlusion of the airway.   Electronically Signed   By: Sherryl Barters M.D.   On: 03/22/2013 12:07   [name] QP Contrast  02/23/2013   CLINICAL DATA:  S RS restaging.  Metastatic lung cancer.  EXAM: MRI HEAD WITHOUT AND WITH CONTRAST  TECHNIQUE: Multiplanar, multiecho pulse sequences of the brain and surrounding structures were obtained without and with intravenous contrast.  CONTRAST:  46mL MULTIHANCE GADOBENATE DIMEGLUMINE 529 MG/ML IV SOLN  COMPARISON:  11/15/2012.  07/14/2012.  FINDINGS: There are no new or progressive lesions. Of all the previously visible metastases, the only focus presently visible is a 1.5 mm focus at the right parietal vertex, unchanged since the previous study. No edema. No mass effect or hemorrhage.  The remainder the brain appears normal. No evidence of ischemic  infarction. No hydrocephalus or extra-axial collection.  No pituitary mass. No inflammatory sinus disease. No skull or skullbase lesion.  IMPRESSION: No new or progressive lesions. The only identifiable focus of enhancement is a 1.5 mm focus in the right parietal vertex region, unchanged since the previous study. This was the site of the largest lesion in the June which measured 10 mm at that time.   Electronically Signed   By: Nelson Chimes M.D.   On: 02/23/2013 14:47   ASSESSMENT AND PLAN: this is a very pleasant 73 years old white female with extensive stage small cell lung cancer status post 4 cycles of systemic chemotherapy with carboplatin and etoposide with significant improvement in her disease. The patient is doing fine today and tolerating her treatment with cisplatin and irinotecan well except for neutropenia. Her recent scan showed improvement in her disease. I discussed the scan results with the patient today and give her a copy of her report. I recommended for her to continue systemic chemotherapy with cisplatin and irinotecan but  I will delay the start of cycle #4 to next week because of neutropenia today. For the chemotherapy-induced neutropenia, I will start the patient on Neupogen 300 mcg subcutaneously x2 daily doses. She would come back for follow up visit in 4 weeks with the start of cycle #5. She was advised to call immediately if she has any concerning symptoms in the interval. The patient voices understanding of current disease status and treatment options and is in agreement with the current care plan.  All questions were answered. The patient knows to call the clinic with any problems, questions or concerns. We can certainly see the patient much sooner if necessary.  Disclaimer: This note was dictated with voice recognition software. Similar sounding words can inadvertently be transcribed and may not be corrected upon review.

## 2013-03-24 ENCOUNTER — Ambulatory Visit (HOSPITAL_BASED_OUTPATIENT_CLINIC_OR_DEPARTMENT_OTHER): Payer: Medicare Other

## 2013-03-24 VITALS — BP 135/82 | HR 92 | Temp 97.8°F | Resp 18

## 2013-03-24 DIAGNOSIS — D702 Other drug-induced agranulocytosis: Secondary | ICD-10-CM

## 2013-03-24 MED ORDER — FILGRASTIM 300 MCG/0.5ML IJ SOLN
300.0000 ug | Freq: Once | INTRAMUSCULAR | Status: AC
  Administered 2013-03-24: 300 ug via SUBCUTANEOUS

## 2013-03-26 ENCOUNTER — Ambulatory Visit: Payer: Medicare Other | Admitting: Internal Medicine

## 2013-03-30 ENCOUNTER — Ambulatory Visit (HOSPITAL_BASED_OUTPATIENT_CLINIC_OR_DEPARTMENT_OTHER): Payer: Medicare Other

## 2013-03-30 ENCOUNTER — Other Ambulatory Visit (HOSPITAL_BASED_OUTPATIENT_CLINIC_OR_DEPARTMENT_OTHER): Payer: Medicare Other

## 2013-03-30 DIAGNOSIS — Z5111 Encounter for antineoplastic chemotherapy: Secondary | ICD-10-CM | POA: Diagnosis not present

## 2013-03-30 DIAGNOSIS — C787 Secondary malignant neoplasm of liver and intrahepatic bile duct: Secondary | ICD-10-CM

## 2013-03-30 DIAGNOSIS — C3492 Malignant neoplasm of unspecified part of left bronchus or lung: Secondary | ICD-10-CM

## 2013-03-30 DIAGNOSIS — C7951 Secondary malignant neoplasm of bone: Secondary | ICD-10-CM

## 2013-03-30 DIAGNOSIS — C3490 Malignant neoplasm of unspecified part of unspecified bronchus or lung: Secondary | ICD-10-CM

## 2013-03-30 DIAGNOSIS — C7952 Secondary malignant neoplasm of bone marrow: Secondary | ICD-10-CM

## 2013-03-30 LAB — CBC WITH DIFFERENTIAL/PLATELET
BASO%: 0.3 % (ref 0.0–2.0)
BASOS ABS: 0 10*3/uL (ref 0.0–0.1)
EOS ABS: 0 10*3/uL (ref 0.0–0.5)
EOS%: 0.7 % (ref 0.0–7.0)
HEMATOCRIT: 34.8 % (ref 34.8–46.6)
HEMOGLOBIN: 11.5 g/dL — AB (ref 11.6–15.9)
LYMPH#: 0.7 10*3/uL — AB (ref 0.9–3.3)
LYMPH%: 19 % (ref 14.0–49.7)
MCH: 29.8 pg (ref 25.1–34.0)
MCHC: 33.1 g/dL (ref 31.5–36.0)
MCV: 89.9 fL (ref 79.5–101.0)
MONO#: 0.6 10*3/uL (ref 0.1–0.9)
MONO%: 15.9 % — ABNORMAL HIGH (ref 0.0–14.0)
NEUT%: 64.1 % (ref 38.4–76.8)
NEUTROS ABS: 2.4 10*3/uL (ref 1.5–6.5)
Platelets: 156 10*3/uL (ref 145–400)
RBC: 3.87 10*6/uL (ref 3.70–5.45)
RDW: 19 % — AB (ref 11.2–14.5)
WBC: 3.8 10*3/uL — ABNORMAL LOW (ref 3.9–10.3)

## 2013-03-30 LAB — COMPREHENSIVE METABOLIC PANEL (CC13)
ALBUMIN: 3.6 g/dL (ref 3.5–5.0)
ALT: 14 U/L (ref 0–55)
AST: 16 U/L (ref 5–34)
Alkaline Phosphatase: 109 U/L (ref 40–150)
Anion Gap: 10 mEq/L (ref 3–11)
BUN: 16 mg/dL (ref 7.0–26.0)
CALCIUM: 9.5 mg/dL (ref 8.4–10.4)
CHLORIDE: 101 meq/L (ref 98–109)
CO2: 25 meq/L (ref 22–29)
CREATININE: 1 mg/dL (ref 0.6–1.1)
Glucose: 76 mg/dl (ref 70–140)
Potassium: 4.5 mEq/L (ref 3.5–5.1)
Sodium: 136 mEq/L (ref 136–145)
Total Bilirubin: 0.2 mg/dL (ref 0.20–1.20)
Total Protein: 6.4 g/dL (ref 6.4–8.3)

## 2013-03-30 LAB — MAGNESIUM (CC13): Magnesium: 1.7 mg/dl (ref 1.5–2.5)

## 2013-03-30 MED ORDER — PALONOSETRON HCL INJECTION 0.25 MG/5ML
INTRAVENOUS | Status: AC
  Filled 2013-03-30: qty 5

## 2013-03-30 MED ORDER — POTASSIUM CHLORIDE 2 MEQ/ML IV SOLN
Freq: Once | INTRAVENOUS | Status: AC
  Administered 2013-03-30: 10:00:00 via INTRAVENOUS
  Filled 2013-03-30: qty 10

## 2013-03-30 MED ORDER — SODIUM CHLORIDE 0.9 % IV SOLN
30.0000 mg/m2 | Freq: Once | INTRAVENOUS | Status: AC
  Administered 2013-03-30: 44 mg via INTRAVENOUS
  Filled 2013-03-30: qty 44

## 2013-03-30 MED ORDER — ATROPINE SULFATE 1 MG/ML IJ SOLN
INTRAMUSCULAR | Status: AC
Start: 2013-03-30 — End: 2013-03-30
  Filled 2013-03-30: qty 1

## 2013-03-30 MED ORDER — SODIUM CHLORIDE 0.9 % IV SOLN
Freq: Once | INTRAVENOUS | Status: AC
  Administered 2013-03-30: 12:00:00 via INTRAVENOUS

## 2013-03-30 MED ORDER — IRINOTECAN HCL CHEMO INJECTION 100 MG/5ML
65.0000 mg/m2 | Freq: Once | INTRAVENOUS | Status: AC
  Administered 2013-03-30: 94 mg via INTRAVENOUS
  Filled 2013-03-30: qty 4.7

## 2013-03-30 MED ORDER — HEPARIN SOD (PORK) LOCK FLUSH 100 UNIT/ML IV SOLN
500.0000 [IU] | Freq: Once | INTRAVENOUS | Status: AC | PRN
  Administered 2013-03-30: 500 [IU]
  Filled 2013-03-30: qty 5

## 2013-03-30 MED ORDER — DEXAMETHASONE SODIUM PHOSPHATE 20 MG/5ML IJ SOLN
INTRAMUSCULAR | Status: AC
  Filled 2013-03-30: qty 5

## 2013-03-30 MED ORDER — SODIUM CHLORIDE 0.9 % IJ SOLN
10.0000 mL | INTRAMUSCULAR | Status: DC | PRN
  Administered 2013-03-30: 10 mL
  Filled 2013-03-30: qty 10

## 2013-03-30 MED ORDER — DEXAMETHASONE SODIUM PHOSPHATE 20 MG/5ML IJ SOLN
12.0000 mg | Freq: Once | INTRAMUSCULAR | Status: AC
  Administered 2013-03-30: 12 mg via INTRAVENOUS

## 2013-03-30 MED ORDER — SODIUM CHLORIDE 0.9 % IV SOLN
150.0000 mg | Freq: Once | INTRAVENOUS | Status: AC
  Administered 2013-03-30: 150 mg via INTRAVENOUS
  Filled 2013-03-30: qty 5

## 2013-03-30 MED ORDER — ATROPINE SULFATE 1 MG/ML IJ SOLN
0.5000 mg | Freq: Once | INTRAMUSCULAR | Status: AC | PRN
  Administered 2013-03-30: 1 mg via INTRAVENOUS

## 2013-03-30 MED ORDER — PALONOSETRON HCL INJECTION 0.25 MG/5ML
0.2500 mg | Freq: Once | INTRAVENOUS | Status: AC
  Administered 2013-03-30: 0.25 mg via INTRAVENOUS

## 2013-04-05 ENCOUNTER — Other Ambulatory Visit (HOSPITAL_BASED_OUTPATIENT_CLINIC_OR_DEPARTMENT_OTHER): Payer: Medicare Other

## 2013-04-05 ENCOUNTER — Ambulatory Visit (HOSPITAL_BASED_OUTPATIENT_CLINIC_OR_DEPARTMENT_OTHER): Payer: Medicare Other

## 2013-04-05 VITALS — BP 136/86 | HR 81 | Temp 97.9°F | Resp 18

## 2013-04-05 DIAGNOSIS — C3490 Malignant neoplasm of unspecified part of unspecified bronchus or lung: Secondary | ICD-10-CM

## 2013-04-05 DIAGNOSIS — C3492 Malignant neoplasm of unspecified part of left bronchus or lung: Secondary | ICD-10-CM

## 2013-04-05 DIAGNOSIS — C7949 Secondary malignant neoplasm of other parts of nervous system: Secondary | ICD-10-CM

## 2013-04-05 DIAGNOSIS — Z5111 Encounter for antineoplastic chemotherapy: Secondary | ICD-10-CM

## 2013-04-05 DIAGNOSIS — C787 Secondary malignant neoplasm of liver and intrahepatic bile duct: Secondary | ICD-10-CM

## 2013-04-05 DIAGNOSIS — C7931 Secondary malignant neoplasm of brain: Secondary | ICD-10-CM

## 2013-04-05 LAB — CBC WITH DIFFERENTIAL/PLATELET
BASO%: 0.8 % (ref 0.0–2.0)
Basophils Absolute: 0 10*3/uL (ref 0.0–0.1)
EOS%: 0.9 % (ref 0.0–7.0)
Eosinophils Absolute: 0 10*3/uL (ref 0.0–0.5)
HCT: 34.5 % — ABNORMAL LOW (ref 34.8–46.6)
HGB: 11.6 g/dL (ref 11.6–15.9)
LYMPH%: 27.3 % (ref 14.0–49.7)
MCH: 30.3 pg (ref 25.1–34.0)
MCHC: 33.7 g/dL (ref 31.5–36.0)
MCV: 89.9 fL (ref 79.5–101.0)
MONO#: 0.4 10*3/uL (ref 0.1–0.9)
MONO%: 12.7 % (ref 0.0–14.0)
NEUT#: 1.7 10*3/uL (ref 1.5–6.5)
NEUT%: 58.3 % (ref 38.4–76.8)
Platelets: 226 10*3/uL (ref 145–400)
RBC: 3.84 10*6/uL (ref 3.70–5.45)
RDW: 19.3 % — ABNORMAL HIGH (ref 11.2–14.5)
WBC: 2.8 10*3/uL — AB (ref 3.9–10.3)
lymph#: 0.8 10*3/uL — ABNORMAL LOW (ref 0.9–3.3)

## 2013-04-05 LAB — COMPREHENSIVE METABOLIC PANEL (CC13)
ALK PHOS: 97 U/L (ref 40–150)
ALT: 13 U/L (ref 0–55)
AST: 14 U/L (ref 5–34)
Albumin: 3.7 g/dL (ref 3.5–5.0)
Anion Gap: 8 mEq/L (ref 3–11)
BILIRUBIN TOTAL: 0.47 mg/dL (ref 0.20–1.20)
BUN: 19.9 mg/dL (ref 7.0–26.0)
CO2: 26 mEq/L (ref 22–29)
Calcium: 9.4 mg/dL (ref 8.4–10.4)
Chloride: 100 mEq/L (ref 98–109)
Creatinine: 1.1 mg/dL (ref 0.6–1.1)
Glucose: 97 mg/dl (ref 70–140)
Potassium: 4.3 mEq/L (ref 3.5–5.1)
SODIUM: 134 meq/L — AB (ref 136–145)
Total Protein: 6.5 g/dL (ref 6.4–8.3)

## 2013-04-05 LAB — MAGNESIUM (CC13): Magnesium: 1.8 mg/dl (ref 1.5–2.5)

## 2013-04-05 MED ORDER — HEPARIN SOD (PORK) LOCK FLUSH 100 UNIT/ML IV SOLN
500.0000 [IU] | Freq: Once | INTRAVENOUS | Status: AC | PRN
  Administered 2013-04-05: 500 [IU]
  Filled 2013-04-05: qty 5

## 2013-04-05 MED ORDER — POTASSIUM CHLORIDE 2 MEQ/ML IV SOLN
Freq: Once | INTRAVENOUS | Status: AC
  Administered 2013-04-05: 10:00:00 via INTRAVENOUS
  Filled 2013-04-05: qty 10

## 2013-04-05 MED ORDER — FOSAPREPITANT DIMEGLUMINE INJECTION 150 MG
150.0000 mg | Freq: Once | INTRAVENOUS | Status: AC
  Administered 2013-04-05: 150 mg via INTRAVENOUS
  Filled 2013-04-05: qty 5

## 2013-04-05 MED ORDER — DEXAMETHASONE SODIUM PHOSPHATE 20 MG/5ML IJ SOLN
12.0000 mg | Freq: Once | INTRAMUSCULAR | Status: AC
  Administered 2013-04-05: 12 mg via INTRAVENOUS

## 2013-04-05 MED ORDER — DEXTROSE 5 % IV SOLN
65.0000 mg/m2 | Freq: Once | INTRAVENOUS | Status: AC
  Administered 2013-04-05: 94 mg via INTRAVENOUS
  Filled 2013-04-05: qty 4.7

## 2013-04-05 MED ORDER — ATROPINE SULFATE 1 MG/ML IJ SOLN
INTRAMUSCULAR | Status: AC
  Filled 2013-04-05: qty 1

## 2013-04-05 MED ORDER — ATROPINE SULFATE 1 MG/ML IJ SOLN
0.5000 mg | Freq: Once | INTRAMUSCULAR | Status: AC | PRN
  Administered 2013-04-05: 0.5 mg via INTRAVENOUS

## 2013-04-05 MED ORDER — SODIUM CHLORIDE 0.9 % IV SOLN
Freq: Once | INTRAVENOUS | Status: AC
  Administered 2013-04-05: 09:00:00 via INTRAVENOUS

## 2013-04-05 MED ORDER — SODIUM CHLORIDE 0.9 % IJ SOLN
10.0000 mL | INTRAMUSCULAR | Status: DC | PRN
  Administered 2013-04-05: 10 mL
  Filled 2013-04-05: qty 10

## 2013-04-05 MED ORDER — PALONOSETRON HCL INJECTION 0.25 MG/5ML
0.2500 mg | Freq: Once | INTRAVENOUS | Status: AC
  Administered 2013-04-05: 0.25 mg via INTRAVENOUS

## 2013-04-05 MED ORDER — DEXAMETHASONE SODIUM PHOSPHATE 20 MG/5ML IJ SOLN
INTRAMUSCULAR | Status: AC
  Filled 2013-04-05: qty 5

## 2013-04-05 MED ORDER — PALONOSETRON HCL INJECTION 0.25 MG/5ML
INTRAVENOUS | Status: AC
  Filled 2013-04-05: qty 5

## 2013-04-05 MED ORDER — SODIUM CHLORIDE 0.9 % IV SOLN
30.0000 mg/m2 | Freq: Once | INTRAVENOUS | Status: AC
  Administered 2013-04-05: 44 mg via INTRAVENOUS
  Filled 2013-04-05: qty 44

## 2013-04-05 NOTE — Patient Instructions (Signed)
[facility] Discharge Instructions for Patients Receiving Chemotherapy  Today you received the following chemotherapy agents cisplatin and camptosar.  To help prevent nausea and vomiting after your treatment, we encourage you to take your nausea medication compazine.   If you develop nausea and vomiting that is not controlled by your nausea medication, call the clinic.   BELOW ARE SYMPTOMS THAT SHOULD BE REPORTED IMMEDIATELY:  *FEVER GREATER THAN 100.5 F  *CHILLS WITH OR WITHOUT FEVER  NAUSEA AND VOMITING THAT IS NOT CONTROLLED WITH YOUR NAUSEA MEDICATION  *UNUSUAL SHORTNESS OF BREATH  *UNUSUAL BRUISING OR BLEEDING  TENDERNESS IN MOUTH AND THROAT WITH OR WITHOUT PRESENCE OF ULCERS  *URINARY PROBLEMS  *BOWEL PROBLEMS  UNUSUAL RASH Items with * indicate a potential emergency and should be followed up as soon as possible.  Feel free to call the clinic you have any questions or concerns. The clinic phone number is [phone].

## 2013-04-05 NOTE — Progress Notes (Signed)
OK for treatment VO Dr. Barnet Pall RN  Despite WBC of 2.8

## 2013-04-12 ENCOUNTER — Other Ambulatory Visit (HOSPITAL_BASED_OUTPATIENT_CLINIC_OR_DEPARTMENT_OTHER): Payer: Medicare Other

## 2013-04-12 DIAGNOSIS — C3492 Malignant neoplasm of unspecified part of left bronchus or lung: Secondary | ICD-10-CM

## 2013-04-12 DIAGNOSIS — C3490 Malignant neoplasm of unspecified part of unspecified bronchus or lung: Secondary | ICD-10-CM

## 2013-04-12 LAB — CBC WITH DIFFERENTIAL/PLATELET
BASO%: 0.2 % (ref 0.0–2.0)
Basophils Absolute: 0 10*3/uL (ref 0.0–0.1)
EOS ABS: 0.1 10*3/uL (ref 0.0–0.5)
EOS%: 1.9 % (ref 0.0–7.0)
HEMATOCRIT: 30.6 % — AB (ref 34.8–46.6)
HGB: 10.3 g/dL — ABNORMAL LOW (ref 11.6–15.9)
LYMPH%: 20 % (ref 14.0–49.7)
MCH: 30.7 pg (ref 25.1–34.0)
MCHC: 33.7 g/dL (ref 31.5–36.0)
MCV: 90.9 fL (ref 79.5–101.0)
MONO#: 0.5 10*3/uL (ref 0.1–0.9)
MONO%: 15.8 % — ABNORMAL HIGH (ref 0.0–14.0)
NEUT%: 62.1 % (ref 38.4–76.8)
NEUTROS ABS: 1.9 10*3/uL (ref 1.5–6.5)
PLATELETS: 258 10*3/uL (ref 145–400)
RBC: 3.37 10*6/uL — ABNORMAL LOW (ref 3.70–5.45)
RDW: 19.5 % — ABNORMAL HIGH (ref 11.2–14.5)
WBC: 3 10*3/uL — AB (ref 3.9–10.3)
lymph#: 0.6 10*3/uL — ABNORMAL LOW (ref 0.9–3.3)

## 2013-04-12 LAB — COMPREHENSIVE METABOLIC PANEL (CC13)
ALT: 14 U/L (ref 0–55)
AST: 16 U/L (ref 5–34)
Albumin: 3.4 g/dL — ABNORMAL LOW (ref 3.5–5.0)
Alkaline Phosphatase: 87 U/L (ref 40–150)
Anion Gap: 9 mEq/L (ref 3–11)
BILIRUBIN TOTAL: 0.21 mg/dL (ref 0.20–1.20)
BUN: 15 mg/dL (ref 7.0–26.0)
CO2: 24 meq/L (ref 22–29)
CREATININE: 1 mg/dL (ref 0.6–1.1)
Calcium: 9.4 mg/dL (ref 8.4–10.4)
Chloride: 101 mEq/L (ref 98–109)
Glucose: 85 mg/dl (ref 70–140)
Potassium: 4.2 mEq/L (ref 3.5–5.1)
Sodium: 134 mEq/L — ABNORMAL LOW (ref 136–145)
TOTAL PROTEIN: 6.2 g/dL — AB (ref 6.4–8.3)

## 2013-04-12 LAB — MAGNESIUM (CC13): MAGNESIUM: 1.6 mg/dL (ref 1.5–2.5)

## 2013-04-18 ENCOUNTER — Other Ambulatory Visit: Payer: Self-pay

## 2013-04-19 ENCOUNTER — Telehealth: Payer: Self-pay | Admitting: Internal Medicine

## 2013-04-19 ENCOUNTER — Ambulatory Visit (HOSPITAL_BASED_OUTPATIENT_CLINIC_OR_DEPARTMENT_OTHER): Payer: Medicare Other

## 2013-04-19 ENCOUNTER — Other Ambulatory Visit (HOSPITAL_BASED_OUTPATIENT_CLINIC_OR_DEPARTMENT_OTHER): Payer: Medicare Other

## 2013-04-19 ENCOUNTER — Encounter: Payer: Self-pay | Admitting: Internal Medicine

## 2013-04-19 ENCOUNTER — Ambulatory Visit (HOSPITAL_BASED_OUTPATIENT_CLINIC_OR_DEPARTMENT_OTHER): Payer: Medicare Other | Admitting: Internal Medicine

## 2013-04-19 VITALS — BP 144/77 | HR 75 | Temp 98.2°F | Resp 20 | Ht 62.0 in | Wt 109.9 lb

## 2013-04-19 DIAGNOSIS — C7931 Secondary malignant neoplasm of brain: Secondary | ICD-10-CM

## 2013-04-19 DIAGNOSIS — C7949 Secondary malignant neoplasm of other parts of nervous system: Secondary | ICD-10-CM

## 2013-04-19 DIAGNOSIS — C787 Secondary malignant neoplasm of liver and intrahepatic bile duct: Secondary | ICD-10-CM

## 2013-04-19 DIAGNOSIS — C3490 Malignant neoplasm of unspecified part of unspecified bronchus or lung: Secondary | ICD-10-CM

## 2013-04-19 DIAGNOSIS — D702 Other drug-induced agranulocytosis: Secondary | ICD-10-CM

## 2013-04-19 DIAGNOSIS — Z5111 Encounter for antineoplastic chemotherapy: Secondary | ICD-10-CM

## 2013-04-19 DIAGNOSIS — C3492 Malignant neoplasm of unspecified part of left bronchus or lung: Secondary | ICD-10-CM

## 2013-04-19 LAB — COMPREHENSIVE METABOLIC PANEL (CC13)
ALBUMIN: 3.5 g/dL (ref 3.5–5.0)
ALK PHOS: 91 U/L (ref 40–150)
ALT: 11 U/L (ref 0–55)
AST: 15 U/L (ref 5–34)
Anion Gap: 10 mEq/L (ref 3–11)
BILIRUBIN TOTAL: 0.22 mg/dL (ref 0.20–1.20)
BUN: 17.2 mg/dL (ref 7.0–26.0)
CO2: 24 mEq/L (ref 22–29)
Calcium: 9.2 mg/dL (ref 8.4–10.4)
Chloride: 102 mEq/L (ref 98–109)
Creatinine: 1 mg/dL (ref 0.6–1.1)
Glucose: 84 mg/dl (ref 70–140)
Potassium: 4.4 mEq/L (ref 3.5–5.1)
SODIUM: 136 meq/L (ref 136–145)
Total Protein: 6.1 g/dL — ABNORMAL LOW (ref 6.4–8.3)

## 2013-04-19 LAB — CBC WITH DIFFERENTIAL/PLATELET
BASO%: 0.5 % (ref 0.0–2.0)
BASOS ABS: 0 10*3/uL (ref 0.0–0.1)
EOS%: 1.6 % (ref 0.0–7.0)
Eosinophils Absolute: 0 10*3/uL (ref 0.0–0.5)
HCT: 29.4 % — ABNORMAL LOW (ref 34.8–46.6)
HEMOGLOBIN: 9.8 g/dL — AB (ref 11.6–15.9)
LYMPH%: 17.3 % (ref 14.0–49.7)
MCH: 30.8 pg (ref 25.1–34.0)
MCHC: 33.5 g/dL (ref 31.5–36.0)
MCV: 92.2 fL (ref 79.5–101.0)
MONO#: 0.5 10*3/uL (ref 0.1–0.9)
MONO%: 19.1 % — ABNORMAL HIGH (ref 0.0–14.0)
NEUT%: 61.5 % (ref 38.4–76.8)
NEUTROS ABS: 1.7 10*3/uL (ref 1.5–6.5)
Platelets: 208 10*3/uL (ref 145–400)
RBC: 3.19 10*6/uL — ABNORMAL LOW (ref 3.70–5.45)
RDW: 20.2 % — ABNORMAL HIGH (ref 11.2–14.5)
WBC: 2.7 10*3/uL — ABNORMAL LOW (ref 3.9–10.3)
lymph#: 0.5 10*3/uL — ABNORMAL LOW (ref 0.9–3.3)

## 2013-04-19 MED ORDER — POTASSIUM CHLORIDE 2 MEQ/ML IV SOLN
Freq: Once | INTRAVENOUS | Status: AC
  Administered 2013-04-19: 10:00:00 via INTRAVENOUS
  Filled 2013-04-19: qty 10

## 2013-04-19 MED ORDER — PALONOSETRON HCL INJECTION 0.25 MG/5ML
0.2500 mg | Freq: Once | INTRAVENOUS | Status: AC
  Administered 2013-04-19: 0.25 mg via INTRAVENOUS

## 2013-04-19 MED ORDER — DEXAMETHASONE SODIUM PHOSPHATE 20 MG/5ML IJ SOLN
INTRAMUSCULAR | Status: AC
  Filled 2013-04-19: qty 5

## 2013-04-19 MED ORDER — SODIUM CHLORIDE 0.9 % IJ SOLN
10.0000 mL | INTRAMUSCULAR | Status: DC | PRN
Start: 2013-04-19 — End: 2013-04-19
  Administered 2013-04-19: 10 mL
  Filled 2013-04-19: qty 10

## 2013-04-19 MED ORDER — DEXTROSE 5 % IV SOLN
65.0000 mg/m2 | Freq: Once | INTRAVENOUS | Status: AC
  Administered 2013-04-19: 94 mg via INTRAVENOUS
  Filled 2013-04-19: qty 4.7

## 2013-04-19 MED ORDER — HEPARIN SOD (PORK) LOCK FLUSH 100 UNIT/ML IV SOLN
500.0000 [IU] | Freq: Once | INTRAVENOUS | Status: AC | PRN
  Administered 2013-04-19: 500 [IU]
  Filled 2013-04-19: qty 5

## 2013-04-19 MED ORDER — SODIUM CHLORIDE 0.9 % IV SOLN
Freq: Once | INTRAVENOUS | Status: AC
  Administered 2013-04-19: 12:00:00 via INTRAVENOUS

## 2013-04-19 MED ORDER — PALONOSETRON HCL INJECTION 0.25 MG/5ML
INTRAVENOUS | Status: AC
  Filled 2013-04-19: qty 5

## 2013-04-19 MED ORDER — SODIUM CHLORIDE 0.9 % IV SOLN
30.0000 mg/m2 | Freq: Once | INTRAVENOUS | Status: AC
  Administered 2013-04-19: 44 mg via INTRAVENOUS
  Filled 2013-04-19: qty 44

## 2013-04-19 MED ORDER — DEXAMETHASONE SODIUM PHOSPHATE 20 MG/5ML IJ SOLN
12.0000 mg | Freq: Once | INTRAMUSCULAR | Status: AC
  Administered 2013-04-19: 12 mg via INTRAVENOUS

## 2013-04-19 MED ORDER — SODIUM CHLORIDE 0.9 % IV SOLN
150.0000 mg | Freq: Once | INTRAVENOUS | Status: AC
  Administered 2013-04-19: 150 mg via INTRAVENOUS
  Filled 2013-04-19: qty 5

## 2013-04-19 MED ORDER — ATROPINE SULFATE 1 MG/ML IJ SOLN
0.5000 mg | Freq: Once | INTRAMUSCULAR | Status: AC | PRN
  Administered 2013-04-19: 0.5 mg via INTRAVENOUS

## 2013-04-19 MED ORDER — ATROPINE SULFATE 1 MG/ML IJ SOLN
INTRAMUSCULAR | Status: AC
  Filled 2013-04-19: qty 1

## 2013-04-19 NOTE — Telephone Encounter (Signed)
gv adn printed aptp sched and avs for pt fro April adn May....sed added tx.

## 2013-04-19 NOTE — Progress Notes (Signed)
[place] [phone]   [phone]  OFFICE PROGRESS NOTE   DIAGNOSIS AND STAGE: Extensive stage small cell lung cancer with large obstructing Center left lung mass with large left pleural effusion, liver and brain metastasis diagnosed in June of 2014   PRIOR THERAPY:  1) Status post whole brain irradiation under the care of Dr. Isidore Moos completed 09/11/2012.  2) whole brain irradiation under the care of Dr. Isidore Moos expected to be completed on 09/11/2012.  3) Systemic chemotherapy with carboplatin for AUC of 5 on day 1 and etoposide 120 mg/M2 on days 1, 2 and 3 with Neulasta support on day 4, status post 4 cycles, last cycle was given on 10/11/2012 with partial response.   CURRENT THERAPY:  Systemic chemotherapy with cisplatin 30 mg/M2 and irinotecan 65 mg/M2 on days 1 and 8 every 3 weeks, status post 4 cycles. First dose 01/16/2013.  CHEMOTHERAPY INTENT: Palliative  CURRENT # OF CHEMOTHERAPY CYCLES: 5 CURRENT ANTIEMETICS: Zofran, dexamethasone and Compazine  CURRENT SMOKING STATUS: Former smoker  ORAL CHEMOTHERAPY AND CONSENT: None  CURRENT BISPHOSPHONATES USE: None  PAIN MANAGEMENT: 0/10 on Vicodin  NARCOTICS INDUCED CONSTIPATION: None  LIVING WILL AND CODE STATUS: no CODE BLUE   INTERVAL HISTORY: Cheryl Nielsen 74 y.o. female returns to the clinic today for follow up visit accompanied by her daughter. The patient is feeling much better today with no specific complaints. She denied having any significant chest pain, shortness of breath, cough or hemoptysis. She has no weight loss or night sweats. She has no nausea or vomiting. She has no fever or chills. She is tolerating her systemic chemotherapy with cisplatin and irinotecan fairly well with no significant adverse effects.she had repeat CT scan of the chest, abdomen and pelvis performed recently and she is here for evaluation and discussion of her scan results.  MEDICAL HISTORY: Past Medical  History  Diagnosis Date  . Pneumonia, organism unspecified   . 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 Status post chemotherapy  10/11/2012     carboplatin  . S/P radiation therapy 08/24/2012-09/11/2012    Whole Brain  / 32.5 Gy in 13 fractions    ALLERGIES:  is allergic to asa; codeine; and tramadol.  MEDICATIONS:  Current Outpatient Prescriptions  Medication Sig Dispense Refill  . acetaminophen (TYLENOL) 500 MG tablet Take 1,000 mg by mouth every 6 (six) hours as needed (For headache.).      Marland Kitchen glucosamine-chondroitin 500-400 MG tablet Take 1 tablet by mouth daily with lunch.       . lidocaine-prilocaine (EMLA) cream Apply 1 application topically daily as needed (Applies to port-a-cath.).  30 g  0  . Multiple Vitamin (MULTIVITAMIN WITH MINERALS) TABS Take 1 tablet by mouth daily with lunch. She takes Dance movement psychotherapist Adult 50+.      . Omega-3 Fatty Acids (FISH OIL CONCENTRATE PO) Take by mouth. Pt unsure of dose, takes daily       No current facility-administered medications for this visit.    SURGICAL HISTORY:  Past Surgical History  Procedure Laterality Date  . Nasal sinus surgery    . Video bronchoscopy Bilateral 07/05/2012    Procedure: VIDEO BRONCHOSCOPY WITHOUT FLUORO;  Surgeon: Tanda Rockers, MD;  Location: [facility];  Service: Cardiopulmonary;  Laterality: Bilateral;  . Portacath placement      REVIEW OF SYSTEMS:  Constitutional: positive for anorexia, fatigue and weight loss Eyes: negative Ears, nose, mouth, throat,  and face: negative Respiratory: positive for dyspnea on exertion Cardiovascular: negative Gastrointestinal: negative Genitourinary:negative Integument/breast: negative Hematologic/lymphatic: negative Musculoskeletal:negative Neurological: negative Behavioral/Psych: negative Endocrine: negative Allergic/Immunologic: negative   PHYSICAL EXAMINATION: General appearance: alert, cooperative, fatigued and no distress Head:  Normocephalic, without obvious abnormality, atraumatic Neck: no adenopathy, no JVD, supple, symmetrical, trachea midline and thyroid not enlarged, symmetric, no tenderness/mass/nodules Lymph nodes: Cervical, supraclavicular, and axillary nodes normal. Resp: clear to auscultation bilaterally Back: symmetric, no curvature. ROM normal. No CVA tenderness. Cardio: regular rate and rhythm, S1, S2 normal, no murmur, click, rub or gallop GI: soft, non-tender; bowel sounds normal; no masses,  no organomegaly Extremities: extremities normal, atraumatic, no cyanosis or edema Neurologic: Alert and oriented X 3, normal strength and tone. Normal symmetric reflexes. Normal coordination and gait  ECOG PERFORMANCE STATUS: 1 - Symptomatic but completely ambulatory  Blood pressure 144/77, pulse 75, temperature 98.2 F (36.8 C), temperature source Oral, resp. rate 20, height 5\' 2"  (1.575 m), weight 109 lb 14.4 oz (49.85 kg), SpO2 98.00%.  LABORATORY DATA: Lab Results  Component Value Date   WBC 2.7* 04/19/2013   HGB 9.8* 04/19/2013   HCT 29.4* 04/19/2013   MCV 92.2 04/19/2013   PLT 208 04/19/2013      Chemistry      Component Value Date/Time   NA 134* 04/12/2013 1007   NA 132* 11/17/2012 1228   K 4.2 04/12/2013 1007   K 4.6 11/17/2012 1228   CL 96 11/17/2012 1228   CL 93* 07/10/2012 1544   CO2 24 04/12/2013 1007   CO2 26 11/17/2012 1228   BUN 15.0 04/12/2013 1007   BUN 16 11/17/2012 1228   CREATININE 1.0 04/12/2013 1007   CREATININE 1.05 11/17/2012 1228      Component Value Date/Time   CALCIUM 9.4 04/12/2013 1007   CALCIUM 9.9 11/17/2012 1228   ALKPHOS 87 04/12/2013 1007   AST 16 04/12/2013 1007   ALT 14 04/12/2013 1007   BILITOT 0.21 04/12/2013 1007       RADIOGRAPHIC STUDIES: Ct Chest W Contrast  03/22/2013   CLINICAL DATA:  Small cell lung cancer, metastatic to the brain.  EXAM: CT CHEST, ABDOMEN, AND PELVIS WITH CONTRAST  TECHNIQUE: Multidetector CT imaging of the chest, abdomen and pelvis was  performed following the standard protocol during bolus administration of intravenous contrast.  CONTRAST:  190mL OMNIPAQUE IOHEXOL 300 MG/ML  SOLN  COMPARISON:  CT ABD/PELVIS W CM dated 01/01/2013  FINDINGS:   CT CHEST FINDINGS  Small pericardial effusion, reduced in size from prior. Small left pleural effusion, stable, with subtle enhancement along the pleural margins but no pleural nodularity observed. Mildly elevated left hemidiaphragm. Thickened mid and distal esophageal walls.  Emphysema noted. Bandlike spiculated densities in the left upper lobe and lingula appear minimally improved compared to prior study, with the lingular band of density measuring 1 point 3 cm (formerly 1.6 cm) and with the more cephalad band of density measuring 8 mm in thickness (formerly a 11 mm). A component of the nodularity in the left upper lobe is improved or resolved, including the 1.1 cm pleural-based nodule along the left upper lobe. The clustered nodularity in the left lower lobe has nearly completely resolved, with only faint interstitial accentuation at the site of prior dense nodular airspace opacity.  Stable right apical pleural parenchymal scarring. The wall thickening around the left upper lobe bronchus appears worsened, with near complete occlusion of the bronchus on image 24 of series 4. Mucus versus tumor noted distally in  the left mainstem bronchus.  Sclerotic metastatic disease observed in the right scapula and at the T8, T9, and T12 levels, similar to prior. Stable right tenth rib sclerotic metastatic lesion.    CT ABDOMEN AND PELVIS FINDINGS  The spleen, pancreas, and gallbladder appear unremarkable.  Left adrenal mass 1.2 cm in short axis thickness (stable), image 50 of series 2. Right adrenal mass 1.5 cm in short axis thickness, previously 1.7 cm. Several hepatic cysts are noted. Hepatic metastatic lesion which previously measured 2.2 cm in diameter currently measures 1.1 cm in diameter. Multiple hepatic  hypodensities are technically too small to characterize. The bilateral hypodense renal lesions are likely cysts.  Aortoiliac atherosclerotic vascular disease noted. Reduced size of the left inguinal fluid density collection, currently 1.4 cm in short axis, formerly 2.1 cm.  Uterine and adnexal contours unremarkable. Sclerotic metastatic lesions at L1 through L4 at and in the bony pelvis and right femoral neck.    IMPRESSION: 1. General improvement compared to the prior exam. Dramatically reduced densities in the left lower lobe with reduced thickening of the bands of suspected tumor in the left upper lobe. Some of the left lower lobe opacity may have represented infection which is cleared. Dramatic reduction in size of the hepatic metastatic lesion with slight reduction in size of the right adrenal metastatic lesion. 2. Overall similar appearance and distribution of the osseous metastatic disease. 3. Mid and distal esophageal wall thickening, suspicious for esophagitis. 4. Of note, the localized wall thickening in the proximal left upper lobe bronchus is worsened, with near occlusion of the airway.   Electronically Signed   By: Sherryl Barters M.D.   On: 03/22/2013 12:07   [name] EA Contrast  02/23/2013   CLINICAL DATA:  S RS restaging.  Metastatic lung cancer.  EXAM: MRI HEAD WITHOUT AND WITH CONTRAST  TECHNIQUE: Multiplanar, multiecho pulse sequences of the brain and surrounding structures were obtained without and with intravenous contrast.  CONTRAST:  11mL MULTIHANCE GADOBENATE DIMEGLUMINE 529 MG/ML IV SOLN  COMPARISON:  11/15/2012.  07/14/2012.  FINDINGS: There are no new or progressive lesions. Of all the previously visible metastases, the only focus presently visible is a 1.5 mm focus at the right parietal vertex, unchanged since the previous study. No edema. No mass effect or hemorrhage.  The remainder the brain appears normal. No evidence of ischemic infarction. No hydrocephalus or extra-axial  collection.  No pituitary mass. No inflammatory sinus disease. No skull or skullbase lesion.  IMPRESSION: No new or progressive lesions. The only identifiable focus of enhancement is a 1.5 mm focus in the right parietal vertex region, unchanged since the previous study. This was the site of the largest lesion in the June which measured 10 mm at that time.   Electronically Signed   By: Nelson Chimes M.D.   On: 02/23/2013 14:47   ASSESSMENT AND PLAN: this is a very pleasant 73 years old white female with extensive stage small cell lung cancer status post 4 cycles of systemic chemotherapy with carboplatin and etoposide with significant improvement in her disease. The patient is doing fine today and tolerating her treatment with cisplatin and irinotecan well except for neutropenia. Her recent CT scan of the chest, abdomen and pelvis showed further improvement in her disease. I discussed the scan results with the patient and her daughter. I gave her the option of continuous treatment with cisplatin and irinotecan versus take a break off chemotherapy. The patient is tolerating the treatment well and she  would like to continue with her current treatment. She would proceed with cycle #5 today as scheduled. She would come back for followup visit in 3 weeks for reevaluation and management any adverse effect of her chemotherapy.  She was advised to call immediately if she has any concerning symptoms in the interval. The patient voices understanding of current disease status and treatment options and is in agreement with the current care plan.  All questions were answered. The patient knows to call the clinic with any problems, questions or concerns. We can certainly see the patient much sooner if necessary.  Disclaimer: This note was dictated with voice recognition software. Similar sounding words can inadvertently be transcribed and may not be corrected upon review.

## 2013-04-19 NOTE — Patient Instructions (Signed)
[facility] Discharge Instructions for Patients Receiving Chemotherapy  Today you received the following chemotherapy agents: Irinotecan, Cisplatin   To help prevent nausea and vomiting after your treatment, we encourage you to take your nausea medication as prescribed.    If you develop nausea and vomiting that is not controlled by your nausea medication, call the clinic.   BELOW ARE SYMPTOMS THAT SHOULD BE REPORTED IMMEDIATELY:  *FEVER GREATER THAN 100.5 F  *CHILLS WITH OR WITHOUT FEVER  NAUSEA AND VOMITING THAT IS NOT CONTROLLED WITH YOUR NAUSEA MEDICATION  *UNUSUAL SHORTNESS OF BREATH  *UNUSUAL BRUISING OR BLEEDING  TENDERNESS IN MOUTH AND THROAT WITH OR WITHOUT PRESENCE OF ULCERS  *URINARY PROBLEMS  *BOWEL PROBLEMS  UNUSUAL RASH Items with * indicate a potential emergency and should be followed up as soon as possible.  Feel free to call the clinic you have any questions or concerns. The clinic phone number is [phone].

## 2013-04-25 ENCOUNTER — Other Ambulatory Visit: Payer: Self-pay | Admitting: *Deleted

## 2013-04-25 ENCOUNTER — Other Ambulatory Visit: Payer: Self-pay

## 2013-04-26 ENCOUNTER — Ambulatory Visit (HOSPITAL_BASED_OUTPATIENT_CLINIC_OR_DEPARTMENT_OTHER): Payer: Medicare Other

## 2013-04-26 ENCOUNTER — Other Ambulatory Visit (HOSPITAL_BASED_OUTPATIENT_CLINIC_OR_DEPARTMENT_OTHER): Payer: Medicare Other

## 2013-04-26 ENCOUNTER — Other Ambulatory Visit: Payer: Self-pay | Admitting: Physician Assistant

## 2013-04-26 VITALS — BP 134/70 | HR 87 | Temp 98.2°F | Resp 18

## 2013-04-26 DIAGNOSIS — C3490 Malignant neoplasm of unspecified part of unspecified bronchus or lung: Secondary | ICD-10-CM

## 2013-04-26 DIAGNOSIS — C7952 Secondary malignant neoplasm of bone marrow: Secondary | ICD-10-CM

## 2013-04-26 DIAGNOSIS — Z5111 Encounter for antineoplastic chemotherapy: Secondary | ICD-10-CM

## 2013-04-26 DIAGNOSIS — C3492 Malignant neoplasm of unspecified part of left bronchus or lung: Secondary | ICD-10-CM

## 2013-04-26 DIAGNOSIS — C7951 Secondary malignant neoplasm of bone: Secondary | ICD-10-CM

## 2013-04-26 DIAGNOSIS — C787 Secondary malignant neoplasm of liver and intrahepatic bile duct: Secondary | ICD-10-CM

## 2013-04-26 LAB — COMPREHENSIVE METABOLIC PANEL (CC13)
ALT: 12 U/L (ref 0–55)
AST: 17 U/L (ref 5–34)
Albumin: 3.6 g/dL (ref 3.5–5.0)
Alkaline Phosphatase: 101 U/L (ref 40–150)
Anion Gap: 7 mEq/L (ref 3–11)
BILIRUBIN TOTAL: 0.21 mg/dL (ref 0.20–1.20)
BUN: 18.2 mg/dL (ref 7.0–26.0)
CO2: 27 mEq/L (ref 22–29)
Calcium: 9.3 mg/dL (ref 8.4–10.4)
Chloride: 101 mEq/L (ref 98–109)
Creatinine: 1.1 mg/dL (ref 0.6–1.1)
Glucose: 71 mg/dl (ref 70–140)
Potassium: 4.3 mEq/L (ref 3.5–5.1)
SODIUM: 135 meq/L — AB (ref 136–145)
TOTAL PROTEIN: 6.3 g/dL — AB (ref 6.4–8.3)

## 2013-04-26 LAB — CBC WITH DIFFERENTIAL/PLATELET
BASO%: 1 % (ref 0.0–2.0)
Basophils Absolute: 0 10*3/uL (ref 0.0–0.1)
EOS%: 0.2 % (ref 0.0–7.0)
Eosinophils Absolute: 0 10*3/uL (ref 0.0–0.5)
HCT: 30.3 % — ABNORMAL LOW (ref 34.8–46.6)
HGB: 10.3 g/dL — ABNORMAL LOW (ref 11.6–15.9)
LYMPH%: 26.6 % (ref 14.0–49.7)
MCH: 31.4 pg (ref 25.1–34.0)
MCHC: 33.9 g/dL (ref 31.5–36.0)
MCV: 92.5 fL (ref 79.5–101.0)
MONO#: 0.4 10*3/uL (ref 0.1–0.9)
MONO%: 17.7 % — AB (ref 0.0–14.0)
NEUT#: 1.3 10*3/uL — ABNORMAL LOW (ref 1.5–6.5)
NEUT%: 54.5 % (ref 38.4–76.8)
PLATELETS: 173 10*3/uL (ref 145–400)
RBC: 3.27 10*6/uL — AB (ref 3.70–5.45)
RDW: 19.3 % — AB (ref 11.2–14.5)
WBC: 2.4 10*3/uL — ABNORMAL LOW (ref 3.9–10.3)
lymph#: 0.6 10*3/uL — ABNORMAL LOW (ref 0.9–3.3)

## 2013-04-26 MED ORDER — POTASSIUM CHLORIDE 2 MEQ/ML IV SOLN
Freq: Once | INTRAVENOUS | Status: AC
  Administered 2013-04-26: 10:00:00 via INTRAVENOUS
  Filled 2013-04-26: qty 10

## 2013-04-26 MED ORDER — SODIUM CHLORIDE 0.9 % IJ SOLN
10.0000 mL | INTRAMUSCULAR | Status: DC | PRN
  Administered 2013-04-26: 10 mL
  Filled 2013-04-26: qty 10

## 2013-04-26 MED ORDER — ATROPINE SULFATE 1 MG/ML IJ SOLN
0.5000 mg | Freq: Once | INTRAMUSCULAR | Status: AC | PRN
  Administered 2013-04-26: 0.5 mg via INTRAVENOUS

## 2013-04-26 MED ORDER — ATROPINE SULFATE 1 MG/ML IJ SOLN
INTRAMUSCULAR | Status: AC
  Filled 2013-04-26: qty 1

## 2013-04-26 MED ORDER — HEPARIN SOD (PORK) LOCK FLUSH 100 UNIT/ML IV SOLN
500.0000 [IU] | Freq: Once | INTRAVENOUS | Status: AC | PRN
  Administered 2013-04-26: 500 [IU]
  Filled 2013-04-26: qty 5

## 2013-04-26 MED ORDER — FOSAPREPITANT DIMEGLUMINE INJECTION 150 MG
150.0000 mg | Freq: Once | INTRAVENOUS | Status: AC
  Administered 2013-04-26: 150 mg via INTRAVENOUS
  Filled 2013-04-26: qty 5

## 2013-04-26 MED ORDER — SODIUM CHLORIDE 0.9 % IV SOLN
30.0000 mg/m2 | Freq: Once | INTRAVENOUS | Status: AC
  Administered 2013-04-26: 44 mg via INTRAVENOUS
  Filled 2013-04-26: qty 44

## 2013-04-26 MED ORDER — IRINOTECAN HCL CHEMO INJECTION 100 MG/5ML
65.0000 mg/m2 | Freq: Once | INTRAVENOUS | Status: AC
  Administered 2013-04-26: 94 mg via INTRAVENOUS
  Filled 2013-04-26: qty 4.7

## 2013-04-26 MED ORDER — DEXAMETHASONE SODIUM PHOSPHATE 20 MG/5ML IJ SOLN
12.0000 mg | Freq: Once | INTRAMUSCULAR | Status: AC
  Administered 2013-04-26: 12 mg via INTRAVENOUS

## 2013-04-26 MED ORDER — DEXAMETHASONE SODIUM PHOSPHATE 20 MG/5ML IJ SOLN
INTRAMUSCULAR | Status: AC
  Filled 2013-04-26: qty 5

## 2013-04-26 MED ORDER — PALONOSETRON HCL INJECTION 0.25 MG/5ML
0.2500 mg | Freq: Once | INTRAVENOUS | Status: AC
  Administered 2013-04-26: 0.25 mg via INTRAVENOUS

## 2013-04-26 MED ORDER — SODIUM CHLORIDE 0.9 % IV SOLN
Freq: Once | INTRAVENOUS | Status: AC
  Administered 2013-04-26: 09:00:00 via INTRAVENOUS

## 2013-04-26 NOTE — Progress Notes (Signed)
Discharged at 1615 with daughter,ambulatory in no distress.

## 2013-04-26 NOTE — Progress Notes (Signed)
CBC and CMET results today showed to [place], [place].  After consulting with Dr. Alen Blew,  Proceed with chemo as per Burnetta Sabin, [place].  Pharmacy notified. Pt voided  1250cc of urine pre Cisplatin.  Voided  350cc post CDDP.

## 2013-04-26 NOTE — Patient Instructions (Signed)
[place] Discharge Instructions for Patients Receiving Chemotherapy  Today you received the following chemotherapy agents :  Cisplatin,  Camptosar.  To help prevent nausea and vomiting after your treatment, we encourage you to take your nausea medication as instructed by your physician.   If you develop nausea and vomiting that is not controlled by your nausea medication, call the clinic.   BELOW ARE SYMPTOMS THAT SHOULD BE REPORTED IMMEDIATELY:  *FEVER GREATER THAN 100.5 F  *CHILLS WITH OR WITHOUT FEVER  NAUSEA AND VOMITING THAT IS NOT CONTROLLED WITH YOUR NAUSEA MEDICATION  *UNUSUAL SHORTNESS OF BREATH  *UNUSUAL BRUISING OR BLEEDING  TENDERNESS IN MOUTH AND THROAT WITH OR WITHOUT PRESENCE OF ULCERS  *URINARY PROBLEMS  *BOWEL PROBLEMS  UNUSUAL RASH Items with * indicate a potential emergency and should be followed up as soon as possible.  Feel free to call the clinic you have any questions or concerns. The clinic phone number is [phone].

## 2013-05-03 ENCOUNTER — Other Ambulatory Visit: Payer: Self-pay | Admitting: Medical Oncology

## 2013-05-03 ENCOUNTER — Other Ambulatory Visit (HOSPITAL_BASED_OUTPATIENT_CLINIC_OR_DEPARTMENT_OTHER): Payer: Medicare Other

## 2013-05-03 DIAGNOSIS — C787 Secondary malignant neoplasm of liver and intrahepatic bile duct: Secondary | ICD-10-CM

## 2013-05-03 DIAGNOSIS — C7949 Secondary malignant neoplasm of other parts of nervous system: Secondary | ICD-10-CM

## 2013-05-03 DIAGNOSIS — C3490 Malignant neoplasm of unspecified part of unspecified bronchus or lung: Secondary | ICD-10-CM

## 2013-05-03 DIAGNOSIS — C7931 Secondary malignant neoplasm of brain: Secondary | ICD-10-CM

## 2013-05-03 LAB — CBC WITH DIFFERENTIAL/PLATELET
BASO%: 0.4 % (ref 0.0–2.0)
Basophils Absolute: 0 10*3/uL (ref 0.0–0.1)
EOS ABS: 0 10*3/uL (ref 0.0–0.5)
EOS%: 0.2 % (ref 0.0–7.0)
HEMATOCRIT: 29.9 % — AB (ref 34.8–46.6)
HGB: 10.1 g/dL — ABNORMAL LOW (ref 11.6–15.9)
LYMPH#: 0.6 10*3/uL — AB (ref 0.9–3.3)
LYMPH%: 19.6 % (ref 14.0–49.7)
MCH: 31.7 pg (ref 25.1–34.0)
MCHC: 33.7 g/dL (ref 31.5–36.0)
MCV: 94.2 fL (ref 79.5–101.0)
MONO#: 0.4 10*3/uL (ref 0.1–0.9)
MONO%: 11.9 % (ref 0.0–14.0)
NEUT#: 2.2 10*3/uL (ref 1.5–6.5)
NEUT%: 67.9 % (ref 38.4–76.8)
Platelets: 209 10*3/uL (ref 145–400)
RBC: 3.18 10*6/uL — ABNORMAL LOW (ref 3.70–5.45)
RDW: 18.9 % — ABNORMAL HIGH (ref 11.2–14.5)
WBC: 3.3 10*3/uL — AB (ref 3.9–10.3)

## 2013-05-03 LAB — COMPREHENSIVE METABOLIC PANEL (CC13)
ALBUMIN: 3.6 g/dL (ref 3.5–5.0)
ALT: 12 U/L (ref 0–55)
ANION GAP: 7 meq/L (ref 3–11)
AST: 15 U/L (ref 5–34)
Alkaline Phosphatase: 102 U/L (ref 40–150)
BUN: 25.3 mg/dL (ref 7.0–26.0)
CALCIUM: 9.1 mg/dL (ref 8.4–10.4)
CHLORIDE: 102 meq/L (ref 98–109)
CO2: 26 meq/L (ref 22–29)
CREATININE: 1 mg/dL (ref 0.6–1.1)
GLUCOSE: 95 mg/dL (ref 70–140)
Potassium: 4.8 mEq/L (ref 3.5–5.1)
Sodium: 134 mEq/L — ABNORMAL LOW (ref 136–145)
Total Bilirubin: 0.2 mg/dL (ref 0.20–1.20)
Total Protein: 6.2 g/dL — ABNORMAL LOW (ref 6.4–8.3)

## 2013-05-09 ENCOUNTER — Other Ambulatory Visit: Payer: Self-pay | Admitting: *Deleted

## 2013-05-09 DIAGNOSIS — C3490 Malignant neoplasm of unspecified part of unspecified bronchus or lung: Secondary | ICD-10-CM

## 2013-05-10 ENCOUNTER — Ambulatory Visit (HOSPITAL_BASED_OUTPATIENT_CLINIC_OR_DEPARTMENT_OTHER): Payer: Medicare Other | Admitting: Internal Medicine

## 2013-05-10 ENCOUNTER — Ambulatory Visit (HOSPITAL_BASED_OUTPATIENT_CLINIC_OR_DEPARTMENT_OTHER): Payer: Medicare Other

## 2013-05-10 ENCOUNTER — Encounter: Payer: Self-pay | Admitting: Internal Medicine

## 2013-05-10 ENCOUNTER — Other Ambulatory Visit (HOSPITAL_BASED_OUTPATIENT_CLINIC_OR_DEPARTMENT_OTHER): Payer: Medicare Other

## 2013-05-10 VITALS — BP 136/75 | HR 96 | Temp 97.4°F | Resp 20 | Ht 62.0 in | Wt 110.4 lb

## 2013-05-10 DIAGNOSIS — C3490 Malignant neoplasm of unspecified part of unspecified bronchus or lung: Secondary | ICD-10-CM

## 2013-05-10 DIAGNOSIS — C787 Secondary malignant neoplasm of liver and intrahepatic bile duct: Secondary | ICD-10-CM

## 2013-05-10 DIAGNOSIS — C7931 Secondary malignant neoplasm of brain: Secondary | ICD-10-CM

## 2013-05-10 DIAGNOSIS — Z5111 Encounter for antineoplastic chemotherapy: Secondary | ICD-10-CM

## 2013-05-10 DIAGNOSIS — C7949 Secondary malignant neoplasm of other parts of nervous system: Secondary | ICD-10-CM

## 2013-05-10 LAB — CBC WITH DIFFERENTIAL/PLATELET
BASO%: 0.5 % (ref 0.0–2.0)
BASOS ABS: 0 10*3/uL (ref 0.0–0.1)
EOS ABS: 0 10*3/uL (ref 0.0–0.5)
EOS%: 1.4 % (ref 0.0–7.0)
HCT: 31.4 % — ABNORMAL LOW (ref 34.8–46.6)
HEMOGLOBIN: 10.4 g/dL — AB (ref 11.6–15.9)
LYMPH#: 0.7 10*3/uL — AB (ref 0.9–3.3)
LYMPH%: 31.3 % (ref 14.0–49.7)
MCH: 31.3 pg (ref 25.1–34.0)
MCHC: 33.1 g/dL (ref 31.5–36.0)
MCV: 94.6 fL (ref 79.5–101.0)
MONO#: 0.4 10*3/uL (ref 0.1–0.9)
MONO%: 20.2 % — ABNORMAL HIGH (ref 0.0–14.0)
NEUT%: 46.6 % (ref 38.4–76.8)
NEUTROS ABS: 1 10*3/uL — AB (ref 1.5–6.5)
Platelets: 169 10*3/uL (ref 145–400)
RBC: 3.32 10*6/uL — ABNORMAL LOW (ref 3.70–5.45)
RDW: 17.3 % — ABNORMAL HIGH (ref 11.2–14.5)
WBC: 2.1 10*3/uL — ABNORMAL LOW (ref 3.9–10.3)
nRBC: 0 % (ref 0–0)

## 2013-05-10 LAB — COMPREHENSIVE METABOLIC PANEL (CC13)
ALK PHOS: 94 U/L (ref 40–150)
ALT: 10 U/L (ref 0–55)
AST: 16 U/L (ref 5–34)
Albumin: 3.7 g/dL (ref 3.5–5.0)
Anion Gap: 9 mEq/L (ref 3–11)
BILIRUBIN TOTAL: 0.31 mg/dL (ref 0.20–1.20)
BUN: 17.9 mg/dL (ref 7.0–26.0)
CO2: 25 meq/L (ref 22–29)
Calcium: 9.5 mg/dL (ref 8.4–10.4)
Chloride: 103 mEq/L (ref 98–109)
Creatinine: 1 mg/dL (ref 0.6–1.1)
GLUCOSE: 69 mg/dL — AB (ref 70–140)
POTASSIUM: 3.8 meq/L (ref 3.5–5.1)
SODIUM: 138 meq/L (ref 136–145)
TOTAL PROTEIN: 6.3 g/dL — AB (ref 6.4–8.3)

## 2013-05-10 LAB — MAGNESIUM (CC13): Magnesium: 1.7 mg/dl (ref 1.5–2.5)

## 2013-05-10 MED ORDER — SODIUM CHLORIDE 0.9 % IV SOLN
Freq: Once | INTRAVENOUS | Status: AC
  Administered 2013-05-10: 09:00:00 via INTRAVENOUS

## 2013-05-10 MED ORDER — DEXAMETHASONE SODIUM PHOSPHATE 20 MG/5ML IJ SOLN
12.0000 mg | Freq: Once | INTRAMUSCULAR | Status: AC
  Administered 2013-05-10: 12 mg via INTRAVENOUS

## 2013-05-10 MED ORDER — LIDOCAINE-PRILOCAINE 2.5-2.5 % EX CREA: 1.0000 "application " | TOPICAL_CREAM | Freq: Every day | CUTANEOUS | Status: DC | PRN

## 2013-05-10 MED ORDER — SODIUM CHLORIDE 0.9 % IJ SOLN
10.0000 mL | INTRAMUSCULAR | Status: DC | PRN
  Administered 2013-05-10: 10 mL
  Filled 2013-05-10: qty 10

## 2013-05-10 MED ORDER — PALONOSETRON HCL INJECTION 0.25 MG/5ML
0.2500 mg | Freq: Once | INTRAVENOUS | Status: AC
  Administered 2013-05-10: 0.25 mg via INTRAVENOUS

## 2013-05-10 MED ORDER — FOSAPREPITANT DIMEGLUMINE INJECTION 150 MG
150.0000 mg | Freq: Once | INTRAVENOUS | Status: AC
  Administered 2013-05-10: 150 mg via INTRAVENOUS
  Filled 2013-05-10: qty 5

## 2013-05-10 MED ORDER — POTASSIUM CHLORIDE 2 MEQ/ML IV SOLN
Freq: Once | INTRAVENOUS | Status: AC
  Administered 2013-05-10: 09:00:00 via INTRAVENOUS
  Filled 2013-05-10: qty 10

## 2013-05-10 MED ORDER — ATROPINE SULFATE 1 MG/ML IJ SOLN
0.5000 mg | Freq: Once | INTRAMUSCULAR | Status: AC | PRN
  Administered 2013-05-10: 0.5 mg via INTRAVENOUS

## 2013-05-10 MED ORDER — ATROPINE SULFATE 1 MG/ML IJ SOLN
INTRAMUSCULAR | Status: AC
  Filled 2013-05-10: qty 1

## 2013-05-10 MED ORDER — IRINOTECAN HCL CHEMO INJECTION 100 MG/5ML
65.0000 mg/m2 | Freq: Once | INTRAVENOUS | Status: AC
  Administered 2013-05-10: 94 mg via INTRAVENOUS
  Filled 2013-05-10: qty 4.7

## 2013-05-10 MED ORDER — DEXAMETHASONE SODIUM PHOSPHATE 20 MG/5ML IJ SOLN
INTRAMUSCULAR | Status: AC
  Filled 2013-05-10: qty 5

## 2013-05-10 MED ORDER — SODIUM CHLORIDE 0.9 % IV SOLN
30.0000 mg/m2 | Freq: Once | INTRAVENOUS | Status: AC
  Administered 2013-05-10: 44 mg via INTRAVENOUS
  Filled 2013-05-10: qty 44

## 2013-05-10 MED ORDER — HEPARIN SOD (PORK) LOCK FLUSH 100 UNIT/ML IV SOLN
500.0000 [IU] | Freq: Once | INTRAVENOUS | Status: AC | PRN
  Administered 2013-05-10: 500 [IU]
  Filled 2013-05-10: qty 5

## 2013-05-10 MED ORDER — PALONOSETRON HCL INJECTION 0.25 MG/5ML
INTRAVENOUS | Status: AC
  Filled 2013-05-10: qty 5

## 2013-05-10 NOTE — Progress Notes (Signed)
Ok to treat with ANC of 1.0 per Dr Julien Nordmann.

## 2013-05-10 NOTE — Patient Instructions (Signed)
[place] Discharge Instructions for Patients Receiving Chemotherapy  Today you received the following chemotherapy agents Camptosar and Cisplatin.  To help prevent nausea and vomiting after your treatment, we encourage you to take your nausea medication.   If you develop nausea and vomiting that is not controlled by your nausea medication, call the clinic.   BELOW ARE SYMPTOMS THAT SHOULD BE REPORTED IMMEDIATELY:  *FEVER GREATER THAN 100.5 F  *CHILLS WITH OR WITHOUT FEVER  NAUSEA AND VOMITING THAT IS NOT CONTROLLED WITH YOUR NAUSEA MEDICATION  *UNUSUAL SHORTNESS OF BREATH  *UNUSUAL BRUISING OR BLEEDING  TENDERNESS IN MOUTH AND THROAT WITH OR WITHOUT PRESENCE OF ULCERS  *URINARY PROBLEMS  *BOWEL PROBLEMS  UNUSUAL RASH Items with * indicate a potential emergency and should be followed up as soon as possible.  Feel free to call the clinic you have any questions or concerns. The clinic phone number is [phone].

## 2013-05-10 NOTE — Progress Notes (Signed)
[place] Telephone:([phone]   Fax:(336) [phone]  OFFICE PROGRESS NOTE   DIAGNOSIS AND STAGE: Extensive stage small cell lung cancer with large obstructing Center left lung mass with large left pleural effusion, liver and brain metastasis diagnosed in June of 2014   PRIOR THERAPY:  1) Status post whole brain irradiation under the care of Dr. Isidore Moos completed 09/11/2012.  2) whole brain irradiation under the care of Dr. Isidore Moos expected to be completed on 09/11/2012.  3) Systemic chemotherapy with carboplatin for AUC of 5 on day 1 and etoposide 120 mg/M2 on days 1, 2 and 3 with Neulasta support on day 4, status post 4 cycles, last cycle was given on 10/11/2012 with partial response.   CURRENT THERAPY:  Systemic chemotherapy with cisplatin 30 mg/M2 and irinotecan 65 mg/M2 on days 1 and 8 every 3 weeks, status post 5 cycles. First dose 01/16/2013.  CHEMOTHERAPY INTENT: Palliative  CURRENT # OF CHEMOTHERAPY CYCLES: 6 CURRENT ANTIEMETICS: Zofran, dexamethasone and Compazine  CURRENT SMOKING STATUS: Former smoker  ORAL CHEMOTHERAPY AND CONSENT: None  CURRENT BISPHOSPHONATES USE: None  PAIN MANAGEMENT: 0/10 on Vicodin  NARCOTICS INDUCED CONSTIPATION: None  LIVING WILL AND CODE STATUS: no CODE BLUE   INTERVAL HISTORY: Cheryl Nielsen 73 y.o. female returns to the clinic today for follow up visit accompanied by her daughter. The patient is feeling much better today with no specific complaints. She denied having any significant chest pain, shortness of breath, cough or hemoptysis. She has no weight loss or night sweats. She has no nausea or vomiting. She has no fever or chills. She is tolerating her systemic chemotherapy with cisplatin and irinotecan fairly well with no significant adverse effects. She is here today to start cycle #6 of her systemic chemotherapy.  MEDICAL HISTORY: Past Medical History  Diagnosis Date  . Pneumonia, organism unspecified   . Lung cancer  07/05/12    Left Mainstem Bronchus- Small Cell Carcinoma  . Status post chemotherapy  10/11/2012     carboplatin  . S/P radiation therapy 08/24/2012-09/11/2012    Whole Brain  / 32.5 Gy in 13 fractions    ALLERGIES:  is allergic to asa; codeine; and tramadol.  MEDICATIONS:  Current Outpatient Prescriptions  Medication Sig Dispense Refill  . acetaminophen (TYLENOL) 500 MG tablet Take 1,000 mg by mouth every 6 (six) hours as needed (For headache.).      Marland Kitchen glucosamine-chondroitin 500-400 MG tablet Take 1 tablet by mouth daily with lunch.       . lidocaine-prilocaine (EMLA) cream Apply 1 application topically daily as needed (Applies to port-a-cath.).  30 g  0  . Multiple Vitamin (MULTIVITAMIN WITH MINERALS) TABS Take 1 tablet by mouth daily with lunch. She takes Dance movement psychotherapist Adult 50+.      . Omega-3 Fatty Acids (FISH OIL CONCENTRATE PO) Take by mouth. Pt unsure of dose, takes daily       No current facility-administered medications for this visit.    SURGICAL HISTORY:  Past Surgical History  Procedure Laterality Date  . Nasal sinus surgery    . Video bronchoscopy Bilateral 07/05/2012    Procedure: VIDEO BRONCHOSCOPY WITHOUT FLUORO;  Surgeon: Tanda Rockers, MD;  Location: [facility];  Service: Cardiopulmonary;  Laterality: Bilateral;  . Portacath placement      REVIEW OF SYSTEMS:  Constitutional: positive for anorexia, fatigue and weight loss Eyes: negative Ears, nose, mouth, throat, and face: negative Respiratory: positive for dyspnea on exertion Cardiovascular: negative Gastrointestinal:  negative Genitourinary:negative Integument/breast: negative Hematologic/lymphatic: negative Musculoskeletal:negative Neurological: negative Behavioral/Psych: negative Endocrine: negative Allergic/Immunologic: negative   PHYSICAL EXAMINATION: General appearance: alert, cooperative, fatigued and no distress Head: Normocephalic, without obvious abnormality, atraumatic Neck: no adenopathy,  no JVD, supple, symmetrical, trachea midline and thyroid not enlarged, symmetric, no tenderness/mass/nodules Lymph nodes: Cervical, supraclavicular, and axillary nodes normal. Resp: clear to auscultation bilaterally Back: symmetric, no curvature. ROM normal. No CVA tenderness. Cardio: regular rate and rhythm, S1, S2 normal, no murmur, click, rub or gallop GI: soft, non-tender; bowel sounds normal; no masses,  no organomegaly Extremities: extremities normal, atraumatic, no cyanosis or edema Neurologic: Alert and oriented X 3, normal strength and tone. Normal symmetric reflexes. Normal coordination and gait  ECOG PERFORMANCE STATUS: 1 - Symptomatic but completely ambulatory  Blood pressure 136/75, pulse 96, temperature 97.4 F (36.3 C), temperature source Oral, resp. rate 20, height 5\' 2"  (1.575 m), weight 110 lb 6.4 oz (50.077 kg).  LABORATORY DATA: Lab Results  Component Value Date   WBC 2.1* 05/10/2013   HGB 10.4* 05/10/2013   HCT 31.4* 05/10/2013   MCV 94.6 05/10/2013   PLT 169 05/10/2013      Chemistry      Component Value Date/Time   NA 134* 05/03/2013 1022   NA 132* 11/17/2012 1228   K 4.8 05/03/2013 1022   K 4.6 11/17/2012 1228   CL 96 11/17/2012 1228   CL 93* 07/10/2012 1544   CO2 26 05/03/2013 1022   CO2 26 11/17/2012 1228   BUN 25.3 05/03/2013 1022   BUN 16 11/17/2012 1228   CREATININE 1.0 05/03/2013 1022   CREATININE 1.05 11/17/2012 1228      Component Value Date/Time   CALCIUM 9.1 05/03/2013 1022   CALCIUM 9.9 11/17/2012 1228   ALKPHOS 102 05/03/2013 1022   AST 15 05/03/2013 1022   ALT 12 05/03/2013 1022   BILITOT 0.20 05/03/2013 1022       RADIOGRAPHIC STUDIES:  ASSESSMENT AND PLAN: this is a very pleasant 73 years old white female with extensive stage small cell lung cancer status post 4 cycles of systemic chemotherapy with carboplatin and etoposide with significant improvement in her disease. She was on observation for few months and the patient had evidence for  disease progression on restaging scan. She was started on second line chemotherapy with cisplatin and irinotecan currently status post 5 cycles. The patient is doing fine today and tolerating her treatment with cisplatin and irinotecan well except for neutropenia. Her absolute neutrophil count is Slightly lower than the acceptable parameters for treatment today. I gave the patient the option of delaying her treatment by one week to give her more time for the neutrophil count to recover versus proceeding with treatment today and close monitoring of her blood count and immediate report for any fever or chills. The patient does not want to delay her treatment. We'll proceed with cycle #6 today as scheduled. She would come back for followup visit in 3 weeks for reevaluation with repeat CT scan of the chest, abdomen and pelvis for restaging of her disease.  She was advised to call immediately if she has any concerning symptoms in the interval. The patient voices understanding of current disease status and treatment options and is in agreement with the current care plan.  All questions were answered. The patient knows to call the clinic with any problems, questions or concerns. We can certainly see the patient much sooner if necessary.  Disclaimer: This note was dictated with voice recognition software.  Similar sounding words can inadvertently be transcribed and may not be corrected upon review.

## 2013-05-17 ENCOUNTER — Ambulatory Visit (HOSPITAL_BASED_OUTPATIENT_CLINIC_OR_DEPARTMENT_OTHER): Payer: Medicare Other

## 2013-05-17 ENCOUNTER — Other Ambulatory Visit (HOSPITAL_BASED_OUTPATIENT_CLINIC_OR_DEPARTMENT_OTHER): Payer: Medicare Other

## 2013-05-17 VITALS — BP 133/83 | HR 86 | Temp 97.6°F

## 2013-05-17 DIAGNOSIS — C7949 Secondary malignant neoplasm of other parts of nervous system: Secondary | ICD-10-CM

## 2013-05-17 DIAGNOSIS — Z5111 Encounter for antineoplastic chemotherapy: Secondary | ICD-10-CM

## 2013-05-17 DIAGNOSIS — C7931 Secondary malignant neoplasm of brain: Secondary | ICD-10-CM

## 2013-05-17 DIAGNOSIS — C3490 Malignant neoplasm of unspecified part of unspecified bronchus or lung: Secondary | ICD-10-CM

## 2013-05-17 DIAGNOSIS — C787 Secondary malignant neoplasm of liver and intrahepatic bile duct: Secondary | ICD-10-CM

## 2013-05-17 LAB — CBC WITH DIFFERENTIAL/PLATELET
BASO%: 0.3 % (ref 0.0–2.0)
Basophils Absolute: 0 10*3/uL (ref 0.0–0.1)
EOS%: 0.6 % (ref 0.0–7.0)
Eosinophils Absolute: 0 10*3/uL (ref 0.0–0.5)
HCT: 30.9 % — ABNORMAL LOW (ref 34.8–46.6)
HEMOGLOBIN: 10.5 g/dL — AB (ref 11.6–15.9)
LYMPH%: 23.9 % (ref 14.0–49.7)
MCH: 32.6 pg (ref 25.1–34.0)
MCHC: 33.9 g/dL (ref 31.5–36.0)
MCV: 96.2 fL (ref 79.5–101.0)
MONO#: 0.3 10*3/uL (ref 0.1–0.9)
MONO%: 14.5 % — AB (ref 0.0–14.0)
NEUT#: 1.3 10*3/uL — ABNORMAL LOW (ref 1.5–6.5)
NEUT%: 60.7 % (ref 38.4–76.8)
Platelets: 128 10*3/uL — ABNORMAL LOW (ref 145–400)
RBC: 3.21 10*6/uL — ABNORMAL LOW (ref 3.70–5.45)
RDW: 16.8 % — AB (ref 11.2–14.5)
WBC: 2.1 10*3/uL — ABNORMAL LOW (ref 3.9–10.3)
lymph#: 0.5 10*3/uL — ABNORMAL LOW (ref 0.9–3.3)

## 2013-05-17 LAB — COMPREHENSIVE METABOLIC PANEL (CC13)
ALT: 11 U/L (ref 0–55)
AST: 15 U/L (ref 5–34)
Albumin: 3.6 g/dL (ref 3.5–5.0)
Alkaline Phosphatase: 94 U/L (ref 40–150)
Anion Gap: 10 mEq/L (ref 3–11)
BUN: 20.8 mg/dL (ref 7.0–26.0)
CALCIUM: 9.4 mg/dL (ref 8.4–10.4)
CHLORIDE: 102 meq/L (ref 98–109)
CO2: 24 meq/L (ref 22–29)
Creatinine: 1.1 mg/dL (ref 0.6–1.1)
Glucose: 90 mg/dl (ref 70–140)
Potassium: 4.1 mEq/L (ref 3.5–5.1)
SODIUM: 136 meq/L (ref 136–145)
TOTAL PROTEIN: 6.3 g/dL — AB (ref 6.4–8.3)
Total Bilirubin: <0.2 mg/dL (ref 0.20–1.20)

## 2013-05-17 MED ORDER — PALONOSETRON HCL INJECTION 0.25 MG/5ML
0.2500 mg | Freq: Once | INTRAVENOUS | Status: AC
  Administered 2013-05-17: 0.25 mg via INTRAVENOUS

## 2013-05-17 MED ORDER — SODIUM CHLORIDE 0.9 % IV SOLN
30.0000 mg/m2 | Freq: Once | INTRAVENOUS | Status: AC
  Administered 2013-05-17: 44 mg via INTRAVENOUS
  Filled 2013-05-17: qty 44

## 2013-05-17 MED ORDER — SODIUM CHLORIDE 0.9 % IV SOLN
150.0000 mg | Freq: Once | INTRAVENOUS | Status: AC
  Administered 2013-05-17: 150 mg via INTRAVENOUS
  Filled 2013-05-17: qty 5

## 2013-05-17 MED ORDER — HEPARIN SOD (PORK) LOCK FLUSH 100 UNIT/ML IV SOLN
500.0000 [IU] | Freq: Once | INTRAVENOUS | Status: AC | PRN
  Administered 2013-05-17: 500 [IU]
  Filled 2013-05-17: qty 5

## 2013-05-17 MED ORDER — ATROPINE SULFATE 1 MG/ML IJ SOLN
0.5000 mg | Freq: Once | INTRAMUSCULAR | Status: AC | PRN
  Administered 2013-05-17: 0.5 mg via INTRAVENOUS

## 2013-05-17 MED ORDER — IRINOTECAN HCL CHEMO INJECTION 100 MG/5ML
65.0000 mg/m2 | Freq: Once | INTRAVENOUS | Status: AC
  Administered 2013-05-17: 94 mg via INTRAVENOUS
  Filled 2013-05-17: qty 4.7

## 2013-05-17 MED ORDER — SODIUM CHLORIDE 0.9 % IJ SOLN
10.0000 mL | INTRAMUSCULAR | Status: DC | PRN
  Administered 2013-05-17: 10 mL
  Filled 2013-05-17: qty 10

## 2013-05-17 MED ORDER — DEXAMETHASONE SODIUM PHOSPHATE 20 MG/5ML IJ SOLN
12.0000 mg | Freq: Once | INTRAMUSCULAR | Status: AC
  Administered 2013-05-17: 12 mg via INTRAVENOUS

## 2013-05-17 MED ORDER — SODIUM CHLORIDE 0.9 % IV SOLN
Freq: Once | INTRAVENOUS | Status: AC
  Administered 2013-05-17: 09:00:00 via INTRAVENOUS

## 2013-05-17 MED ORDER — POTASSIUM CHLORIDE 2 MEQ/ML IV SOLN
Freq: Once | INTRAVENOUS | Status: AC
  Administered 2013-05-17: 09:00:00 via INTRAVENOUS
  Filled 2013-05-17: qty 10

## 2013-05-17 NOTE — Patient Instructions (Signed)
[facility] Discharge Instructions for Patients Receiving Chemotherapy  Today you received the following chemotherapy agents Irinotecan/Cisplatin To help prevent nausea and vomiting after your treatment, we encourage you to take your nausea medication as prescribed.If you develop nausea and vomiting that is not controlled by your nausea medication, call the clinic.   BELOW ARE SYMPTOMS THAT SHOULD BE REPORTED IMMEDIATELY:  *FEVER GREATER THAN 100.5 F  *CHILLS WITH OR WITHOUT FEVER  NAUSEA AND VOMITING THAT IS NOT CONTROLLED WITH YOUR NAUSEA MEDICATION  *UNUSUAL SHORTNESS OF BREATH  *UNUSUAL BRUISING OR BLEEDING  TENDERNESS IN MOUTH AND THROAT WITH OR WITHOUT PRESENCE OF ULCERS  *URINARY PROBLEMS  *BOWEL PROBLEMS  UNUSUAL RASH Items with * indicate a potential emergency and should be followed up as soon as possible.  Feel free to call the clinic you have any questions or concerns. The clinic phone number is [phone].

## 2013-05-17 NOTE — Progress Notes (Signed)
Ok to treat with ANC 1.3 per Dr. Julien Nordmann.

## 2013-05-18 ENCOUNTER — Ambulatory Visit
Admission: RE | Admit: 2013-05-18 | Discharge: 2013-05-18 | Disposition: A | Discharge status: Home / Self Care | Payer: Medicare Other | Source: Ambulatory Visit | Attending: Radiation Oncology | Admitting: Radiation Oncology

## 2013-05-18 DIAGNOSIS — C7931 Secondary malignant neoplasm of brain: Secondary | ICD-10-CM

## 2013-05-18 DIAGNOSIS — C7949 Secondary malignant neoplasm of other parts of nervous system: Principal | ICD-10-CM

## 2013-05-18 MED ORDER — GADOBENATE DIMEGLUMINE 529 MG/ML IV SOLN
9.0000 mL | Freq: Once | INTRAVENOUS | Status: AC | PRN
Start: 2013-05-18 — End: 2013-05-18
  Administered 2013-05-18: 9 mL via INTRAVENOUS

## 2013-05-21 ENCOUNTER — Telehealth: Payer: Self-pay | Admitting: *Deleted

## 2013-05-21 DIAGNOSIS — C3490 Malignant neoplasm of unspecified part of unspecified bronchus or lung: Secondary | ICD-10-CM

## 2013-05-21 MED ORDER — ONDANSETRON HCL 8 MG PO TABS: 8.0000 mg | ORAL_TABLET | Freq: Three times a day (TID) | ORAL | Status: DC | PRN

## 2013-05-21 NOTE — Telephone Encounter (Signed)
NOTIFIED PT.'S DAUGHTER, [name], THAT MEDICATION REFILL WAS SENT TO [facility] ON [address].

## 2013-05-23 ENCOUNTER — Telehealth: Payer: Self-pay | Admitting: *Deleted

## 2013-05-23 DIAGNOSIS — C3490 Malignant neoplasm of unspecified part of unspecified bronchus or lung: Secondary | ICD-10-CM

## 2013-05-23 MED ORDER — DIPHENOXYLATE-ATROPINE 2.5-0.025 MG PO TABS: 1.0000 | ORAL_TABLET | Freq: Two times a day (BID) | ORAL | Status: DC | PRN

## 2013-05-23 NOTE — Telephone Encounter (Signed)
Verbal order received and read back for lomotil twice daily in addition to the imodium.  Called patient to notify her of this order.  Plans to leave tomorrow after lab appointment.  Instructed to plan accordingly for this trip to [place] with lots of water, Gatorade, pads/depends and ask driver to make stops along the way.

## 2013-05-23 NOTE — Telephone Encounter (Signed)
   Provider input needed: Diarrhea with irinotecan   Reason for call: Daughter called to get control over diarrhea so they can atke mom to the beach for Mother's Day.  Gastrointestinal: positive for diarrhea and nausea   ALLERGIES:  is allergic to asa; codeine; and tramadol.  Patient last received chemotherapy/ treatment on 05-17-2013 irinotecan, Cisplatin  Patient was last seen in the office on 05-10-2013  Next appt is 05-31-2013  Is patient having fevers greater than 100.5?  no   Is patient having uncontrolled pain, or new pain? no   Is patient having new back pain that changes with position (worsens or eases when laying down?)  no   Is patient able to eat and drink? "yes, I was forcing myself to eat up until yesterday.  I believe the nausea is under control.  I am drinking gatorade and getting at least 64 oz in daily."   Is patient able to pass stool without difficulty?   yes     Is patient having uncontrolled nausea?  no, It's improved    patient and After daughter [name] called, this nurse called patient calls 05/23/2013 with complaint of  Gastrointestinal: positive for diarrhea, nausea and diarrhea started 05-19-2013, was taking liquid immodium, ran out and a friend brought over a generic anti-diarrheal format.  I can go 10 to 12 hours without a bm then suddenly it comes back.  Takes full dose at on-set and half dose with each successive BM.  I go on average 4 to 5 times and today I've had 3 bowel movements so far.  Bowels have started moving with no warninig, had an accident.  I am afraid to try to get in the car to go to the beach.  I use [facility] at [place].   Summary Based on the above information advised patient to  Continue taking imodium with no limit on maximum daily amount.  Information will be reviewed with provider and we will call her back at [phone].   Eulan Heyward Winston-Spruiell  05/23/2013, 11:55 AM   Background Leonard   DOB: [birth year]    MR#: [id number]   CSN#   [id number] 05/23/2013

## 2013-05-23 NOTE — Telephone Encounter (Signed)
Received note from switchboard that pt's daughter called stating that she was having a reaction to her chemo and needed to be called ASAP.  Called, no answer, left message to call back.

## 2013-05-24 ENCOUNTER — Other Ambulatory Visit (HOSPITAL_BASED_OUTPATIENT_CLINIC_OR_DEPARTMENT_OTHER): Payer: Medicare Other

## 2013-05-24 DIAGNOSIS — C3410 Malignant neoplasm of upper lobe, unspecified bronchus or lung: Secondary | ICD-10-CM

## 2013-05-24 DIAGNOSIS — C7949 Secondary malignant neoplasm of other parts of nervous system: Secondary | ICD-10-CM

## 2013-05-24 DIAGNOSIS — C3490 Malignant neoplasm of unspecified part of unspecified bronchus or lung: Secondary | ICD-10-CM

## 2013-05-24 DIAGNOSIS — C787 Secondary malignant neoplasm of liver and intrahepatic bile duct: Secondary | ICD-10-CM

## 2013-05-24 DIAGNOSIS — C7931 Secondary malignant neoplasm of brain: Secondary | ICD-10-CM

## 2013-05-24 LAB — CBC WITH DIFFERENTIAL/PLATELET
BASO%: 0 % (ref 0.0–2.0)
BASOS ABS: 0 10*3/uL (ref 0.0–0.1)
EOS ABS: 0 10*3/uL (ref 0.0–0.5)
EOS%: 0.5 % (ref 0.0–7.0)
HCT: 27.4 % — ABNORMAL LOW (ref 34.8–46.6)
HGB: 9.4 g/dL — ABNORMAL LOW (ref 11.6–15.9)
LYMPH%: 21.6 % (ref 14.0–49.7)
MCH: 31.9 pg (ref 25.1–34.0)
MCHC: 34.3 g/dL (ref 31.5–36.0)
MCV: 92.9 fL (ref 79.5–101.0)
MONO#: 0.3 10*3/uL (ref 0.1–0.9)
MONO%: 14.9 % — AB (ref 0.0–14.0)
NEUT%: 63 % (ref 38.4–76.8)
NEUTROS ABS: 1.4 10*3/uL — AB (ref 1.5–6.5)
PLATELETS: 137 10*3/uL — AB (ref 145–400)
RBC: 2.95 10*6/uL — ABNORMAL LOW (ref 3.70–5.45)
RDW: 14.7 % — AB (ref 11.2–14.5)
WBC: 2.2 10*3/uL — ABNORMAL LOW (ref 3.9–10.3)
lymph#: 0.5 10*3/uL — ABNORMAL LOW (ref 0.9–3.3)

## 2013-05-25 ENCOUNTER — Other Ambulatory Visit: Payer: Medicare Other

## 2013-05-28 ENCOUNTER — Ambulatory Visit
Admission: RE | Admit: 2013-05-28 | Discharge: 2013-05-28 | Disposition: A | Discharge status: Home / Self Care | Payer: Medicare Other | Source: Ambulatory Visit | Attending: Radiation Oncology | Admitting: Radiation Oncology

## 2013-05-28 ENCOUNTER — Encounter: Payer: Self-pay | Admitting: Radiation Oncology

## 2013-05-28 VITALS — BP 143/73 | HR 94 | Temp 98.2°F | Resp 20 | Wt 110.7 lb

## 2013-05-28 DIAGNOSIS — C7931 Secondary malignant neoplasm of brain: Secondary | ICD-10-CM

## 2013-05-28 DIAGNOSIS — C7949 Secondary malignant neoplasm of other parts of nervous system: Principal | ICD-10-CM

## 2013-05-28 NOTE — Progress Notes (Addendum)
Pt denies pain, HA, nausea, dizziness, blurred vision. She states after her last chemo 2 1/2 weeks ago she had approximately one week of n/v, diarrhea, fatigue. She states that has all resolved. She states her appetite is "too good", but her energy level remains somewhat low. She states she is unsteady, but this has been ongoing. Pt is not on steroid therapy at this time. Pt to review MRI brain results with Dr Isidore Moos.

## 2013-05-28 NOTE — Progress Notes (Signed)
Radiation Oncology         [phone] ________________________________  Name: Cheryl Nielsen MRN: [id number]  Date: 05/28/2013  DOB: 10-Dec-1940  Follow-Up Visit Note  outpatient  CC: No PCP Per Patient  No ref. 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 She denies pain, HA, nausea, dizziness, blurred vision. She states after her last chemo 2 1/2 weeks ago she had approximately one week of n/v, diarrhea, fatigue. She states that has all resolved. She states her appetite is "too good", but her energy level remains somewhat low. She states she is unsteady, but this has been ongoing. Pt is not on steroid therapy at this time.  We reviewed her MRI images today.                              ALLERGIES:  is allergic to asa; codeine; and tramadol.  Meds: Current Outpatient Prescriptions  Medication Sig Dispense Refill  . acetaminophen (TYLENOL) 500 MG tablet Take 1,000 mg by mouth every 6 (six) hours as needed (For headache.).      Marland Kitchen diphenoxylate-atropine (LOMOTIL) 2.5-0.025 MG per tablet Take 1 tablet by mouth 2 (two) times daily as needed for diarrhea or loose stools.  60 tablet  1  . glucosamine-chondroitin 500-400 MG tablet Take 1 tablet by mouth daily with lunch.       . lidocaine-prilocaine (EMLA) cream Apply 1 application topically daily as needed (Applies to port-a-cath.).  30 g  0  . Multiple Vitamin (MULTIVITAMIN WITH MINERALS) TABS Take 1 tablet by mouth daily with lunch. She takes Dance movement psychotherapist Adult 50+.      . Omega-3 Fatty Acids (FISH OIL CONCENTRATE PO) Take by mouth. Pt unsure of dose, takes daily      . ondansetron (ZOFRAN) 8 MG tablet Take 1  tablet (8 mg total) by mouth every 8 (eight) hours as needed for nausea or vomiting.  30 tablet  0   No current facility-administered medications for this encounter.    Physical Findings: The patient is in no acute distress. Patient is alert and oriented.  weight is 110 lb 11.2 oz (50.213 kg). Her oral temperature is 98.2 F (36.8 C). Her blood pressure is 143/73 and her pulse is 94. Her respiration is 20. Marland Kitchen    General: Alert and oriented, in no acute distress HEENT: Head is normocephalic.  Extraocular movements are intact. Oropharynx is clear. Heart: Regular in rate and rhythm with no murmurs, rubs, or gallops. Chest: Clear to auscultation bilaterally, with no rhonchi, wheezes, or rales. Extremities: No cyanosis or edema. Musculoskeletal: symmetric strength and muscle tone throughout. Neurologic: Cranial nerves II through XII are grossly intact. No obvious focalities. Speech is fluent. Coordination is intact. Psychiatric: Judgment and insight are intact. Affect is appropriate.   Lab Findings: Lab Results  Component Value Date   WBC 2.2* 05/24/2013   HGB 9.4* 05/24/2013   HCT 27.4* 05/24/2013   MCV 92.9 05/24/2013   PLT 137* 05/24/2013    Radiographic Findings: [name] VV Contrast  05/22/2013   ADDENDUM REPORT: 05/22/2013 10:10  ADDENDUM: There are 2 marrow space lesions consistent  with osseous metastatic disease. Within the bone at the anterior aspect to the middle cranial fossa on the left there is a 7 mm metastatic lesion. Within the midline calvarium adjacent to the torcula there is a 15 mm intraosseous metastatic lesion.   Electronically Signed   By: Nelson Chimes M.D.   On: 05/22/2013 10:10   05/22/2013   CLINICAL DATA:  S RS restaging.  Metastatic lung cancer.  EXAM: MRI HEAD WITHOUT AND WITH CONTRAST  TECHNIQUE: Multiplanar, multiecho pulse sequences of the brain and surrounding structures were obtained without and with intravenous contrast.  CONTRAST:  61mL MULTIHANCE GADOBENATE  DIMEGLUMINE 529 MG/ML IV SOLN  COMPARISON:  02/25/2013.  11/15/2012.  FINDINGS: Since the previous study, diffuse confluent white matter signal throughout the cerebral hemispheres has become slightly more prominent, consistent with radiation change.  There are no new enhancing foci within the brain. There is no residual enhancement related to the multiple lesions previously seen. No evidence of acute infarction. No hydrocephalus or extra-axial collection. No evidence of skull or skullbase and metastasis. No abnormal brain or meningeal enhancement. No pituitary mass. No inflammatory sinus disease.  IMPRESSION: No evidence of residual or recurrent metastatic disease. Previously seen foci of enhancement are now completely an apparent.  Slight increase in diffuse T2 white matter signal consistent with radiation change.  Electronically Signed: By: Nelson Chimes M.D. On: 05/18/2013 10:29    Impression/Plan:  MRI images as above were reviewed at tumor board. I showed the images to her and her son. She has some cranial mets (skull mets) that are asymptomatic, but the parenchyma of the brain has no evidence of residual or recurrent metastatic disease.  I recommended f/u MRI in 3 mo but she prefers to do so in 4 mo which is fine. I will see her back following that scan. She knows to call if issues arise before then.  _____________________________________   Eppie Gibson, MD

## 2013-05-30 ENCOUNTER — Other Ambulatory Visit: Payer: Self-pay | Admitting: Medical Oncology

## 2013-05-30 DIAGNOSIS — C3490 Malignant neoplasm of unspecified part of unspecified bronchus or lung: Secondary | ICD-10-CM

## 2013-05-31 ENCOUNTER — Other Ambulatory Visit: Payer: Medicare Other

## 2013-05-31 ENCOUNTER — Ambulatory Visit: Payer: Medicare Other

## 2013-05-31 ENCOUNTER — Ambulatory Visit (HOSPITAL_BASED_OUTPATIENT_CLINIC_OR_DEPARTMENT_OTHER): Payer: Medicare Other | Admitting: Internal Medicine

## 2013-05-31 ENCOUNTER — Other Ambulatory Visit (HOSPITAL_BASED_OUTPATIENT_CLINIC_OR_DEPARTMENT_OTHER): Payer: Medicare Other

## 2013-05-31 ENCOUNTER — Encounter: Payer: Self-pay | Admitting: Internal Medicine

## 2013-05-31 VITALS — BP 145/84 | HR 91 | Temp 98.6°F | Resp 18 | Ht 62.0 in | Wt 111.6 lb

## 2013-05-31 DIAGNOSIS — C3490 Malignant neoplasm of unspecified part of unspecified bronchus or lung: Secondary | ICD-10-CM

## 2013-05-31 DIAGNOSIS — C7949 Secondary malignant neoplasm of other parts of nervous system: Secondary | ICD-10-CM

## 2013-05-31 DIAGNOSIS — C7931 Secondary malignant neoplasm of brain: Secondary | ICD-10-CM

## 2013-05-31 DIAGNOSIS — C787 Secondary malignant neoplasm of liver and intrahepatic bile duct: Secondary | ICD-10-CM

## 2013-05-31 LAB — COMPREHENSIVE METABOLIC PANEL (CC13)
ALK PHOS: 83 U/L (ref 40–150)
ALT: 19 U/L (ref 0–55)
AST: 16 U/L (ref 5–34)
Albumin: 3.4 g/dL — ABNORMAL LOW (ref 3.5–5.0)
Anion Gap: 10 mEq/L (ref 3–11)
BUN: 14.6 mg/dL (ref 7.0–26.0)
CO2: 25 mEq/L (ref 22–29)
Calcium: 9.5 mg/dL (ref 8.4–10.4)
Chloride: 103 mEq/L (ref 98–109)
Creatinine: 1.1 mg/dL (ref 0.6–1.1)
Glucose: 78 mg/dl (ref 70–140)
Potassium: 4.5 mEq/L (ref 3.5–5.1)
Sodium: 138 mEq/L (ref 136–145)
Total Protein: 6.1 g/dL — ABNORMAL LOW (ref 6.4–8.3)

## 2013-05-31 LAB — CBC WITH DIFFERENTIAL/PLATELET
BASO%: 0.7 % (ref 0.0–2.0)
BASOS ABS: 0 10*3/uL (ref 0.0–0.1)
EOS ABS: 0 10*3/uL (ref 0.0–0.5)
EOS%: 1.1 % (ref 0.0–7.0)
HCT: 29.6 % — ABNORMAL LOW (ref 34.8–46.6)
HEMOGLOBIN: 10 g/dL — AB (ref 11.6–15.9)
LYMPH%: 32.8 % (ref 14.0–49.7)
MCH: 33 pg (ref 25.1–34.0)
MCHC: 33.7 g/dL (ref 31.5–36.0)
MCV: 97.8 fL (ref 79.5–101.0)
MONO#: 0.4 10*3/uL (ref 0.1–0.9)
MONO%: 22.7 % — AB (ref 0.0–14.0)
NEUT%: 42.7 % (ref 38.4–76.8)
NEUTROS ABS: 0.8 10*3/uL — AB (ref 1.5–6.5)
PLATELETS: 198 10*3/uL (ref 145–400)
RBC: 3.03 10*6/uL — AB (ref 3.70–5.45)
RDW: 15.8 % — ABNORMAL HIGH (ref 11.2–14.5)
WBC: 1.8 10*3/uL — ABNORMAL LOW (ref 3.9–10.3)
lymph#: 0.6 10*3/uL — ABNORMAL LOW (ref 0.9–3.3)

## 2013-05-31 NOTE — Progress Notes (Signed)
[place] [phone]   [phone]  OFFICE PROGRESS NOTE   DIAGNOSIS AND STAGE: Extensive stage small cell lung cancer with large obstructing Center left lung mass with large left pleural effusion, liver and brain metastasis diagnosed in June of 2014   PRIOR THERAPY:  1) Status post whole brain irradiation under the care of Dr. Isidore Moos completed 09/11/2012.  2) whole brain irradiation under the care of Dr. Isidore Moos expected to be completed on 09/11/2012.  3) Systemic chemotherapy with carboplatin for AUC of 5 on day 1 and etoposide 120 mg/M2 on days 1, 2 and 3 with Neulasta support on day 4, status post 4 cycles, last cycle was given on 10/11/2012 with partial response.   CURRENT THERAPY:  Systemic chemotherapy with cisplatin 30 mg/M2 and irinotecan 65 mg/M2 on days 1 and 8 every 3 weeks, status post 5 cycles. First dose 01/16/2013.  CHEMOTHERAPY INTENT: Palliative  CURRENT # OF CHEMOTHERAPY CYCLES: 6 CURRENT ANTIEMETICS: Zofran, dexamethasone and Compazine  CURRENT SMOKING STATUS: Former smoker  ORAL CHEMOTHERAPY AND CONSENT: None  CURRENT BISPHOSPHONATES USE: None  PAIN MANAGEMENT: 0/10 on Vicodin  NARCOTICS INDUCED CONSTIPATION: None  LIVING WILL AND CODE STATUS: no CODE BLUE   INTERVAL HISTORY: Cheryl Nielsen 73 y.o. female returns to the clinic today for follow up visit accompanied by her daughter. The patient is feeling much better today with no specific complaints. She denied having any significant chest pain, shortness of breath, cough or hemoptysis. She has no weight loss or night sweats. She has no nausea or vomiting. She has no fever or chills. She is tolerating her systemic chemotherapy with cisplatin and irinotecan fairly well with no significant adverse effects. She completed 6 cycles of her systemic chemotherapy and was here today to start cycle #7.   MEDICAL HISTORY: Past Medical History  Diagnosis Date  . Pneumonia, organism  unspecified   . 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 Status post chemotherapy  10/11/2012     carboplatin  . S/P radiation therapy 08/24/2012-09/11/2012    Whole Brain  / 32.5 Gy in 13 fractions    ALLERGIES:  is allergic to asa; codeine; and tramadol.  MEDICATIONS:  Current Outpatient Prescriptions  Medication Sig Dispense Refill  . acetaminophen (TYLENOL) 500 MG tablet Take 1,000 mg by mouth every 6 (six) hours as needed (For headache.).      Marland Kitchen glucosamine-chondroitin 500-400 MG tablet Take 1 tablet by mouth daily with lunch.       . lidocaine-prilocaine (EMLA) cream Apply 1 application topically daily as needed (Applies to port-a-cath.).  30 g  0  . Multiple Vitamin (MULTIVITAMIN WITH MINERALS) TABS Take 1 tablet by mouth daily with lunch. She takes Dance movement psychotherapist Adult 50+.      . Omega-3 Fatty Acids (FISH OIL CONCENTRATE PO) Take by mouth. Pt unsure of dose, takes daily      . ondansetron (ZOFRAN) 8 MG tablet Take 1 tablet (8 mg total) by mouth every 8 (eight) hours as needed for nausea or vomiting.  30 tablet  0  . diphenoxylate-atropine (LOMOTIL) 2.5-0.025 MG per tablet Take 1 tablet by mouth 2 (two) times daily as needed for diarrhea or loose stools.  60 tablet  1   No current facility-administered medications for this visit.    SURGICAL HISTORY:  Past Surgical History  Procedure Laterality Date  . Nasal sinus surgery    . Video bronchoscopy Bilateral  07/05/2012    Procedure: VIDEO BRONCHOSCOPY WITHOUT FLUORO;  Surgeon: Tanda Rockers, MD;  Location: [facility];  Service: Cardiopulmonary;  Laterality: Bilateral;  . Portacath placement      REVIEW OF SYSTEMS:  Constitutional: positive for anorexia, fatigue and weight loss Eyes: negative Ears, nose, mouth, throat, and face: negative Respiratory: positive for dyspnea on exertion Cardiovascular: negative Gastrointestinal: negative Genitourinary:negative Integument/breast:  negative Hematologic/lymphatic: negative Musculoskeletal:negative Neurological: negative Behavioral/Psych: negative Endocrine: negative Allergic/Immunologic: negative   PHYSICAL EXAMINATION: General appearance: alert, cooperative, fatigued and no distress Head: Normocephalic, without obvious abnormality, atraumatic Neck: no adenopathy, no JVD, supple, symmetrical, trachea midline and thyroid not enlarged, symmetric, no tenderness/mass/nodules Lymph nodes: Cervical, supraclavicular, and axillary nodes normal. Resp: clear to auscultation bilaterally Back: symmetric, no curvature. ROM normal. No CVA tenderness. Cardio: regular rate and rhythm, S1, S2 normal, no murmur, click, rub or gallop GI: soft, non-tender; bowel sounds normal; no masses,  no organomegaly Extremities: extremities normal, atraumatic, no cyanosis or edema Neurologic: Alert and oriented X 3, normal strength and tone. Normal symmetric reflexes. Normal coordination and gait  ECOG PERFORMANCE STATUS: 1 - Symptomatic but completely ambulatory  Blood pressure 145/84, pulse 91, temperature 98.6 F (37 C), temperature source Oral, resp. rate 18, height 5\' 2"  (1.575 m), weight 111 lb 9.6 oz (50.621 kg), SpO2 100.00%.  LABORATORY DATA: Lab Results  Component Value Date   WBC 1.8* 05/31/2013   HGB 10.0* 05/31/2013   HCT 29.6* 05/31/2013   MCV 97.8 05/31/2013   PLT 198 05/31/2013      Chemistry      Component Value Date/Time   NA 136 05/17/2013 0832   NA 132* 11/17/2012 1228   K 4.1 05/17/2013 0832   K 4.6 11/17/2012 1228   CL 96 11/17/2012 1228   CL 93* 07/10/2012 1544   CO2 24 05/17/2013 0832   CO2 26 11/17/2012 1228   BUN 20.8 05/17/2013 0832   BUN 16 11/17/2012 1228   CREATININE 1.1 05/17/2013 0832   CREATININE 1.05 11/17/2012 1228      Component Value Date/Time   CALCIUM 9.4 05/17/2013 0832   CALCIUM 9.9 11/17/2012 1228   ALKPHOS 94 05/17/2013 0832   AST 15 05/17/2013 0832   ALT 11 05/17/2013 0832   BILITOT <0.20  05/17/2013 0160       RADIOGRAPHIC STUDIES: [name] FU Contrast  06/19/13   ADDENDUM REPORT: 06/19/13 10:10  ADDENDUM: There are 2 marrow space lesions consistent with osseous metastatic disease. Within the bone at the anterior aspect to the middle cranial fossa on the left there is a 7 mm metastatic lesion. Within the midline calvarium adjacent to the torcula there is a 15 mm intraosseous metastatic lesion.   Electronically Signed   By: Nelson Chimes M.D.   On: June 19, 2013 10:10   06/19/2013   CLINICAL DATA:  S RS restaging.  Metastatic lung cancer.  EXAM: MRI HEAD WITHOUT AND WITH CONTRAST  TECHNIQUE: Multiplanar, multiecho pulse sequences of the brain and surrounding structures were obtained without and with intravenous contrast.  CONTRAST:  73mL MULTIHANCE GADOBENATE DIMEGLUMINE 529 MG/ML IV SOLN  COMPARISON:  02/25/2013.  11/15/2012.  FINDINGS: Since the previous study, diffuse confluent white matter signal throughout the cerebral hemispheres has become slightly more prominent, consistent with radiation change.  There are no new enhancing foci within the brain. There is no residual enhancement related to the multiple lesions previously seen. No evidence of acute infarction. No hydrocephalus or extra-axial collection. No evidence of skull or  skullbase and metastasis. No abnormal brain or meningeal enhancement. No pituitary mass. No inflammatory sinus disease.  IMPRESSION: No evidence of residual or recurrent metastatic disease. Previously seen foci of enhancement are now completely an apparent.  Slight increase in diffuse T2 white matter signal consistent with radiation change.  Electronically Signed: By: Nelson Chimes M.D. On: 05/18/2013 10:29    ASSESSMENT AND PLAN: this is a very pleasant 73 years old white female with extensive stage small cell lung cancer status post 4 cycles of systemic chemotherapy with carboplatin and etoposide with significant improvement in her disease. She was on observation  for few months and the patient had evidence for disease progression on restaging scan. She was started on second line chemotherapy with cisplatin and irinotecan currently status post 6 cycles. The patient is doing fine today and tolerating her treatment with cisplatin and irinotecan well except for neutropenia. Her absolute neutrophil count is Slightly lower than the acceptable parameters for treatment today.  I will give her systemic chemotherapy to start next week. She would have repeat CT scan of the chest, abdomen and pelvis in the next few days and if no evidence for disease progression, the patient will continue with her current treatment with followup visit in 4 weeks. The patient and her daughter agreed to the current plan. She was advised to call immediately if she has any concerning symptoms in the interval. The patient voices understanding of current disease status and treatment options and is in agreement with the current care plan.  All questions were answered. The patient knows to call the clinic with any problems, questions or concerns. We can certainly see the patient much sooner if necessary.  Disclaimer: This note was dictated with voice recognition software. Similar sounding words can inadvertently be transcribed and may not be corrected upon review.

## 2013-05-31 NOTE — Progress Notes (Signed)
Pt treatment cancelled d/t blood counts.

## 2013-06-04 ENCOUNTER — Ambulatory Visit (HOSPITAL_COMMUNITY)
Admission: RE | Admit: 2013-06-04 | Discharge: 2013-06-04 | Disposition: A | Discharge status: Home / Self Care | Payer: Medicare Other | Source: Ambulatory Visit | Attending: Internal Medicine | Admitting: Internal Medicine

## 2013-06-04 ENCOUNTER — Telehealth: Payer: Self-pay | Admitting: *Deleted

## 2013-06-04 ENCOUNTER — Telehealth: Payer: Self-pay | Admitting: Internal Medicine

## 2013-06-04 ENCOUNTER — Encounter (HOSPITAL_COMMUNITY): Payer: Self-pay

## 2013-06-04 DIAGNOSIS — C7951 Secondary malignant neoplasm of bone: Secondary | ICD-10-CM | POA: Insufficient documentation

## 2013-06-04 DIAGNOSIS — C3490 Malignant neoplasm of unspecified part of unspecified bronchus or lung: Secondary | ICD-10-CM | POA: Insufficient documentation

## 2013-06-04 DIAGNOSIS — C7952 Secondary malignant neoplasm of bone marrow: Secondary | ICD-10-CM

## 2013-06-04 DIAGNOSIS — C787 Secondary malignant neoplasm of liver and intrahepatic bile duct: Secondary | ICD-10-CM | POA: Insufficient documentation

## 2013-06-04 MED ORDER — IOHEXOL 300 MG/ML  SOLN
100.0000 mL | Freq: Once | INTRAMUSCULAR | Status: AC | PRN
  Administered 2013-06-04: 100 mL via INTRAVENOUS

## 2013-06-04 MED ORDER — IOHEXOL 300 MG/ML  SOLN: 80.0000 mL | Freq: Once | INTRAMUSCULAR | Status: DC | PRN

## 2013-06-04 NOTE — Telephone Encounter (Signed)
lvm for pt regarding to May appts.Marland KitchenMarland Kitchenpt will get new sched on nxt visit

## 2013-06-04 NOTE — Telephone Encounter (Signed)
Per staff message and POF I have scheduled appts. Advised scheduler no available for treatment on 5/8 moved to 5/29.  JMW

## 2013-06-05 ENCOUNTER — Telehealth: Payer: Self-pay | Admitting: *Deleted

## 2013-06-05 NOTE — Telephone Encounter (Signed)
Pt's daughter [name] called wanting to know what the CT results were and whether she would get chemo on 5/21.  Per dr Vista Mink, disease is stable and she can proceed with tx on 5/21 as long as the lab work looks good.  [name] verbalized understanding.  SLJ

## 2013-06-06 ENCOUNTER — Other Ambulatory Visit: Payer: Self-pay | Admitting: *Deleted

## 2013-06-06 DIAGNOSIS — C3490 Malignant neoplasm of unspecified part of unspecified bronchus or lung: Secondary | ICD-10-CM

## 2013-06-07 ENCOUNTER — Other Ambulatory Visit (HOSPITAL_BASED_OUTPATIENT_CLINIC_OR_DEPARTMENT_OTHER): Payer: Medicare Other

## 2013-06-07 ENCOUNTER — Ambulatory Visit: Payer: Medicare Other

## 2013-06-07 ENCOUNTER — Telehealth: Payer: Self-pay | Admitting: *Deleted

## 2013-06-07 DIAGNOSIS — C3400 Malignant neoplasm of unspecified main bronchus: Secondary | ICD-10-CM

## 2013-06-07 DIAGNOSIS — C3490 Malignant neoplasm of unspecified part of unspecified bronchus or lung: Secondary | ICD-10-CM

## 2013-06-07 LAB — CBC WITH DIFFERENTIAL/PLATELET
BASO%: 0.9 % (ref 0.0–2.0)
Basophils Absolute: 0 10*3/uL (ref 0.0–0.1)
EOS%: 1.7 % (ref 0.0–7.0)
Eosinophils Absolute: 0 10*3/uL (ref 0.0–0.5)
HEMATOCRIT: 31 % — AB (ref 34.8–46.6)
HGB: 10.4 g/dL — ABNORMAL LOW (ref 11.6–15.9)
LYMPH%: 26 % (ref 14.0–49.7)
MCH: 33.2 pg (ref 25.1–34.0)
MCHC: 33.6 g/dL (ref 31.5–36.0)
MCV: 98.8 fL (ref 79.5–101.0)
MONO#: 0.6 10*3/uL (ref 0.1–0.9)
MONO%: 26.5 % — ABNORMAL HIGH (ref 0.0–14.0)
NEUT%: 44.9 % (ref 38.4–76.8)
NEUTROS ABS: 1 10*3/uL — AB (ref 1.5–6.5)
PLATELETS: 195 10*3/uL (ref 145–400)
RBC: 3.14 10*6/uL — ABNORMAL LOW (ref 3.70–5.45)
RDW: 15.3 % — ABNORMAL HIGH (ref 11.2–14.5)
WBC: 2.2 10*3/uL — ABNORMAL LOW (ref 3.9–10.3)
lymph#: 0.6 10*3/uL — ABNORMAL LOW (ref 0.9–3.3)

## 2013-06-07 LAB — COMPREHENSIVE METABOLIC PANEL (CC13)
ALT: 27 U/L (ref 0–55)
ANION GAP: 11 meq/L (ref 3–11)
AST: 19 U/L (ref 5–34)
Albumin: 3.5 g/dL (ref 3.5–5.0)
Alkaline Phosphatase: 94 U/L (ref 40–150)
BILIRUBIN TOTAL: 0.2 mg/dL (ref 0.20–1.20)
BUN: 18.2 mg/dL (ref 7.0–26.0)
CO2: 23 meq/L (ref 22–29)
CREATININE: 1.2 mg/dL — AB (ref 0.6–1.1)
Calcium: 9.3 mg/dL (ref 8.4–10.4)
Chloride: 102 mEq/L (ref 98–109)
Glucose: 74 mg/dl (ref 70–140)
Potassium: 4.3 mEq/L (ref 3.5–5.1)
SODIUM: 136 meq/L (ref 136–145)
TOTAL PROTEIN: 6.3 g/dL — AB (ref 6.4–8.3)

## 2013-06-07 LAB — MAGNESIUM (CC13): MAGNESIUM: 2.2 mg/dL (ref 1.5–2.5)

## 2013-06-07 NOTE — Progress Notes (Signed)
Treatment cancelled per Awilda Metro, PA because of Neut = 1.0; schedulers to call patient to reschedule treatment; patient is aware.

## 2013-06-07 NOTE — Telephone Encounter (Signed)
Per Awilda Metro, no chemo today with ANC 1.0.  Will delay start of cycle 7 to 5/29 and have day 8 delayed to 06/22/13.  Pt also wanted to know when she can f/u with Dr Vista Mink to discuss CT scan results.  Onc tx schedule filled out to delay tx x 1 week.  SLJ

## 2013-06-12 ENCOUNTER — Telehealth: Payer: Self-pay | Admitting: Medical Oncology

## 2013-06-12 ENCOUNTER — Telehealth: Payer: Self-pay | Admitting: Internal Medicine

## 2013-06-12 NOTE — Telephone Encounter (Signed)
s/w pt dtr re appt for 6/5 @ 8:30am. dtr aware 5/29 appts cx'd. added 6/5 f/u and cxd 5/29 per 5/26 pof.

## 2013-06-12 NOTE — Telephone Encounter (Signed)
Returned call. Chemo held last week and wants to hold it this week and wait one more week . She understood that was okay with Dr Julien Nordmann. Per Dr Julien Nordmann he wants to see pt next week .Onc Tx request sent.

## 2013-06-14 ENCOUNTER — Telehealth: Payer: Self-pay | Admitting: Internal Medicine

## 2013-06-14 ENCOUNTER — Other Ambulatory Visit: Payer: Medicare Other

## 2013-06-14 ENCOUNTER — Other Ambulatory Visit: Payer: Self-pay

## 2013-06-14 DIAGNOSIS — C3490 Malignant neoplasm of unspecified part of unspecified bronchus or lung: Secondary | ICD-10-CM

## 2013-06-14 NOTE — Telephone Encounter (Signed)
s.w. pt daughter and r/s lab that was cx .Marland Kitchen..done pt aware

## 2013-06-15 ENCOUNTER — Other Ambulatory Visit: Payer: Medicare Other

## 2013-06-15 ENCOUNTER — Ambulatory Visit: Payer: Medicare Other

## 2013-06-15 ENCOUNTER — Other Ambulatory Visit (HOSPITAL_BASED_OUTPATIENT_CLINIC_OR_DEPARTMENT_OTHER): Payer: Medicare Other

## 2013-06-15 ENCOUNTER — Ambulatory Visit: Payer: Medicare Other | Admitting: Internal Medicine

## 2013-06-15 DIAGNOSIS — C3490 Malignant neoplasm of unspecified part of unspecified bronchus or lung: Secondary | ICD-10-CM

## 2013-06-15 DIAGNOSIS — C3400 Malignant neoplasm of unspecified main bronchus: Secondary | ICD-10-CM

## 2013-06-15 LAB — CBC WITH DIFFERENTIAL/PLATELET
BASO%: 0.9 % (ref 0.0–2.0)
BASOS ABS: 0 10*3/uL (ref 0.0–0.1)
EOS%: 0.6 % (ref 0.0–7.0)
Eosinophils Absolute: 0 10*3/uL (ref 0.0–0.5)
HCT: 32.8 % — ABNORMAL LOW (ref 34.8–46.6)
HEMOGLOBIN: 10.9 g/dL — AB (ref 11.6–15.9)
LYMPH#: 0.7 10*3/uL — AB (ref 0.9–3.3)
LYMPH%: 19.3 % (ref 14.0–49.7)
MCH: 32.7 pg (ref 25.1–34.0)
MCHC: 33.4 g/dL (ref 31.5–36.0)
MCV: 98.1 fL (ref 79.5–101.0)
MONO#: 0.7 10*3/uL (ref 0.1–0.9)
MONO%: 19.5 % — ABNORMAL HIGH (ref 0.0–14.0)
NEUT#: 2.1 10*3/uL (ref 1.5–6.5)
NEUT%: 59.7 % (ref 38.4–76.8)
Platelets: 239 10*3/uL (ref 145–400)
RBC: 3.34 10*6/uL — AB (ref 3.70–5.45)
RDW: 14.6 % — ABNORMAL HIGH (ref 11.2–14.5)
WBC: 3.6 10*3/uL — ABNORMAL LOW (ref 3.9–10.3)

## 2013-06-15 LAB — COMPREHENSIVE METABOLIC PANEL (CC13)
ALBUMIN: 3.6 g/dL (ref 3.5–5.0)
ALT: 12 U/L (ref 0–55)
AST: 18 U/L (ref 5–34)
Alkaline Phosphatase: 91 U/L (ref 40–150)
Anion Gap: 9 mEq/L (ref 3–11)
BUN: 18.1 mg/dL (ref 7.0–26.0)
CO2: 25 mEq/L (ref 22–29)
Calcium: 9.4 mg/dL (ref 8.4–10.4)
Chloride: 101 mEq/L (ref 98–109)
Creatinine: 1.1 mg/dL (ref 0.6–1.1)
Glucose: 70 mg/dl (ref 70–140)
POTASSIUM: 4.4 meq/L (ref 3.5–5.1)
Sodium: 135 mEq/L — ABNORMAL LOW (ref 136–145)
Total Bilirubin: 0.22 mg/dL (ref 0.20–1.20)
Total Protein: 6.5 g/dL (ref 6.4–8.3)

## 2013-06-21 ENCOUNTER — Other Ambulatory Visit: Payer: Medicare Other

## 2013-06-21 ENCOUNTER — Other Ambulatory Visit: Payer: Self-pay | Admitting: Medical Oncology

## 2013-06-21 DIAGNOSIS — C3490 Malignant neoplasm of unspecified part of unspecified bronchus or lung: Secondary | ICD-10-CM

## 2013-06-22 ENCOUNTER — Encounter: Payer: Self-pay | Admitting: Internal Medicine

## 2013-06-22 ENCOUNTER — Ambulatory Visit: Payer: Medicare Other

## 2013-06-22 ENCOUNTER — Other Ambulatory Visit (HOSPITAL_BASED_OUTPATIENT_CLINIC_OR_DEPARTMENT_OTHER): Payer: Medicare Other

## 2013-06-22 ENCOUNTER — Ambulatory Visit (HOSPITAL_BASED_OUTPATIENT_CLINIC_OR_DEPARTMENT_OTHER): Payer: Medicare Other | Admitting: Internal Medicine

## 2013-06-22 VITALS — BP 145/83 | HR 71 | Temp 97.5°F | Resp 18 | Ht 62.0 in | Wt 110.1 lb

## 2013-06-22 DIAGNOSIS — C7931 Secondary malignant neoplasm of brain: Secondary | ICD-10-CM

## 2013-06-22 DIAGNOSIS — C7949 Secondary malignant neoplasm of other parts of nervous system: Secondary | ICD-10-CM

## 2013-06-22 DIAGNOSIS — C3490 Malignant neoplasm of unspecified part of unspecified bronchus or lung: Secondary | ICD-10-CM

## 2013-06-22 DIAGNOSIS — C787 Secondary malignant neoplasm of liver and intrahepatic bile duct: Secondary | ICD-10-CM

## 2013-06-22 DIAGNOSIS — D709 Neutropenia, unspecified: Secondary | ICD-10-CM

## 2013-06-22 LAB — COMPREHENSIVE METABOLIC PANEL (CC13)
ALBUMIN: 3.6 g/dL (ref 3.5–5.0)
ALT: 13 U/L (ref 0–55)
AST: 17 U/L (ref 5–34)
Alkaline Phosphatase: 97 U/L (ref 40–150)
Anion Gap: 10 mEq/L (ref 3–11)
BILIRUBIN TOTAL: 0.24 mg/dL (ref 0.20–1.20)
BUN: 17.9 mg/dL (ref 7.0–26.0)
CO2: 24 mEq/L (ref 22–29)
Calcium: 9.3 mg/dL (ref 8.4–10.4)
Chloride: 102 mEq/L (ref 98–109)
Creatinine: 1 mg/dL (ref 0.6–1.1)
Glucose: 59 mg/dl — ABNORMAL LOW (ref 70–140)
POTASSIUM: 4.6 meq/L (ref 3.5–5.1)
SODIUM: 135 meq/L — AB (ref 136–145)
TOTAL PROTEIN: 6.5 g/dL (ref 6.4–8.3)

## 2013-06-22 LAB — CBC WITH DIFFERENTIAL/PLATELET
BASO%: 1.1 % (ref 0.0–2.0)
Basophils Absolute: 0 10*3/uL (ref 0.0–0.1)
EOS%: 0.9 % (ref 0.0–7.0)
Eosinophils Absolute: 0 10*3/uL (ref 0.0–0.5)
HCT: 33.8 % — ABNORMAL LOW (ref 34.8–46.6)
HGB: 11.3 g/dL — ABNORMAL LOW (ref 11.6–15.9)
LYMPH%: 23.2 % (ref 14.0–49.7)
MCH: 32.5 pg (ref 25.1–34.0)
MCHC: 33.5 g/dL (ref 31.5–36.0)
MCV: 97.1 fL (ref 79.5–101.0)
MONO#: 0.6 10*3/uL (ref 0.1–0.9)
MONO%: 18.7 % — AB (ref 0.0–14.0)
NEUT#: 1.8 10*3/uL (ref 1.5–6.5)
NEUT%: 56.1 % (ref 38.4–76.8)
Platelets: 202 10*3/uL (ref 145–400)
RBC: 3.48 10*6/uL — AB (ref 3.70–5.45)
RDW: 14.3 % (ref 11.2–14.5)
WBC: 3.2 10*3/uL — ABNORMAL LOW (ref 3.9–10.3)
lymph#: 0.7 10*3/uL — ABNORMAL LOW (ref 0.9–3.3)

## 2013-06-22 NOTE — Progress Notes (Signed)
[place] [phone]   [phone]  OFFICE PROGRESS NOTE   DIAGNOSIS AND STAGE: Extensive stage small cell lung cancer with large obstructing Center left lung mass with large left pleural effusion, liver and brain metastasis diagnosed in June of 2014   PRIOR THERAPY:  1) Status post whole brain irradiation under the care of Dr. Isidore Moos completed 09/11/2012.  2) whole brain irradiation under the care of Dr. Isidore Moos expected to be completed on 09/11/2012.  3) Systemic chemotherapy with carboplatin for AUC of 5 on day 1 and etoposide 120 mg/M2 on days 1, 2 and 3 with Neulasta support on day 4, status post 4 cycles, last cycle was given on 10/11/2012 with partial response.   CURRENT THERAPY:  Systemic chemotherapy with cisplatin 30 mg/M2 and irinotecan 65 mg/M2 on days 1 and 8 every 3 weeks, status post 6 cycles. First dose 01/16/2013.  CHEMOTHERAPY INTENT: Palliative  CURRENT # OF CHEMOTHERAPY CYCLES: 7 CURRENT ANTIEMETICS: Zofran, dexamethasone and Compazine  CURRENT SMOKING STATUS: Former smoker  ORAL CHEMOTHERAPY AND CONSENT: None  CURRENT BISPHOSPHONATES USE: None  PAIN MANAGEMENT: 0/10 on Vicodin  NARCOTICS INDUCED CONSTIPATION: None  LIVING WILL AND CODE STATUS: no CODE BLUE   INTERVAL HISTORY: Cheryl Nielsen 73 y.o. female returns to the clinic today for follow up visit accompanied by her daughter. She has been off treatment for the last few weeks. The patient is feeling much better today with no specific complaints. She denied having any significant chest pain, shortness of breath, cough or hemoptysis. She has no weight loss or night sweats. She has no nausea or vomiting. She has no fever or chills. She completed 6 cycles of her systemic chemotherapy. She had repeat CT scan of the chest, abdomen and pelvis performed 2 weeks ago and she is here for evaluation and discussion of her scan results.  MEDICAL HISTORY: Past Medical History    Diagnosis Date  . Pneumonia, organism unspecified   . Status post chemotherapy  10/11/2012     carboplatin  . S/P radiation therapy 08/24/2012-09/11/2012    Whole Brain  / 32.5 Gy in 13 fractions  . Lung cancer 07/05/12    Left Mainstem Bronchus- Small Cell Carcinoma    ALLERGIES:  is allergic to asa; codeine; and tramadol.  MEDICATIONS:  Current Outpatient Prescriptions  Medication Sig Dispense Refill  . acetaminophen (TYLENOL) 500 MG tablet Take 1,000 mg by mouth every 6 (six) hours as needed (For headache.).      Marland Kitchen glucosamine-chondroitin 500-400 MG tablet Take 1 tablet by mouth daily with lunch.       . lidocaine-prilocaine (EMLA) cream Apply 1 application topically daily as needed (Applies to port-a-cath.).  30 g  0  . Multiple Vitamin (MULTIVITAMIN WITH MINERALS) TABS Take 1 tablet by mouth daily with lunch. She takes Dance movement psychotherapist Adult 50+.      . Omega-3 Fatty Acids (FISH OIL CONCENTRATE PO) Take by mouth. Pt unsure of dose, takes daily      . ondansetron (ZOFRAN) 8 MG tablet Take 1 tablet (8 mg total) by mouth every 8 (eight) hours as needed for nausea or vomiting.  30 tablet  0  . diphenoxylate-atropine (LOMOTIL) 2.5-0.025 MG per tablet Take 1 tablet by mouth 2 (two) times daily as needed for diarrhea or loose stools.  60 tablet  1   No current facility-administered medications for this visit.    SURGICAL HISTORY:  Past Surgical History  Procedure Laterality Date  .  Nasal sinus surgery    . Video bronchoscopy Bilateral 07/05/2012    Procedure: VIDEO BRONCHOSCOPY WITHOUT FLUORO;  Surgeon: Tanda Rockers, MD;  Location: [facility];  Service: Cardiopulmonary;  Laterality: Bilateral;  . Portacath placement      REVIEW OF SYSTEMS:  Constitutional: positive for anorexia, fatigue and weight loss Eyes: negative Ears, nose, mouth, throat, and face: negative Respiratory: positive for dyspnea on exertion Cardiovascular: negative Gastrointestinal:  negative Genitourinary:negative Integument/breast: negative Hematologic/lymphatic: negative Musculoskeletal:negative Neurological: negative Behavioral/Psych: negative Endocrine: negative Allergic/Immunologic: negative   PHYSICAL EXAMINATION: General appearance: alert, cooperative, fatigued and no distress Head: Normocephalic, without obvious abnormality, atraumatic Neck: no adenopathy, no JVD, supple, symmetrical, trachea midline and thyroid not enlarged, symmetric, no tenderness/mass/nodules Lymph nodes: Cervical, supraclavicular, and axillary nodes normal. Resp: clear to auscultation bilaterally Back: symmetric, no curvature. ROM normal. No CVA tenderness. Cardio: regular rate and rhythm, S1, S2 normal, no murmur, click, rub or gallop GI: soft, non-tender; bowel sounds normal; no masses,  no organomegaly Extremities: extremities normal, atraumatic, no cyanosis or edema Neurologic: Alert and oriented X 3, normal strength and tone. Normal symmetric reflexes. Normal coordination and gait  ECOG PERFORMANCE STATUS: 1 - Symptomatic but completely ambulatory  There were no vitals taken for this visit.  LABORATORY DATA: Lab Results  Component Value Date   WBC 3.2* 06/22/2013   HGB 11.3* 06/22/2013   HCT 33.8* 06/22/2013   MCV 97.1 06/22/2013   PLT 202 06/22/2013      Chemistry      Component Value Date/Time   NA 135* 06/15/2013 0958   NA 132* 11/17/2012 1228   K 4.4 06/15/2013 0958   K 4.6 11/17/2012 1228   CL 96 11/17/2012 1228   CL 93* 07/10/2012 1544   CO2 25 06/15/2013 0958   CO2 26 11/17/2012 1228   BUN 18.1 06/15/2013 0958   BUN 16 11/17/2012 1228   CREATININE 1.1 06/15/2013 0958   CREATININE 1.05 11/17/2012 1228      Component Value Date/Time   CALCIUM 9.4 06/15/2013 0958   CALCIUM 9.9 11/17/2012 1228   ALKPHOS 91 06/15/2013 0958   AST 18 06/15/2013 0958   ALT 12 06/15/2013 0958   BILITOT 0.22 06/15/2013 0958       RADIOGRAPHIC STUDIES: Ct Chest W Contrast  06/04/2013    CLINICAL DATA:  Metastatic Lung cancer.  EXAM: CT CHEST, ABDOMEN, AND PELVIS WITH CONTRAST  TECHNIQUE: Multidetector CT imaging of the chest, abdomen and pelvis was performed following the standard protocol during bolus administration of intravenous contrast.  CONTRAST:  120mL OMNIPAQUE IOHEXOL 300 MG/ML  SOLN  COMPARISON:  03/22/2013  FINDINGS:   CT CHEST FINDINGS  The chest wall is stable. No supraclavicular or axillary mass or adenopathy. The bony thorax demonstrates stable sclerotic metastatic bone lesions. No new lesions. No spinal canal compromise.  The heart is normal in size. No pericardial effusion. Small scattered mediastinal and hilar lymph nodes but no mass or lymphadenopathy. The esophageal wall is thickened and edematous. This may represent radiation change. The aorta is normal in caliber. No dissection. The major branch vessels are patent.  Examination of the lung parenchyma demonstrates stable emphysematous changes. The apical scarring is stable. A small right upper lobe lung lesion on image number 11 is unchanged. The small adjacent pulmonary nodules are unchanged. The subpleural nodular density in the left upper lobe is no longer identified and may have been subpleural atelectasis.  The nodularity along the left major fissure is unchanged. Small pulmonary nodule on  image number 11 is unchanged. The left upper lobe lung lesion on image number 18 has decreased in size since the prior study. It measures 26 by 7 mm and previously measured 25 x 12 mm. The entire left suprahilar density is stable in size measuring approximately 44 x 20 mm. It previously measured 44 x 22 mm.  No new pulmonary lesions. No acute pulmonary findings. The small left pleural effusion is stable. Complete resolution of the left lower lobe infiltrate which was identified on the prior study.    CT ABDOMEN AND PELVIS FINDINGS  The metastatic right hepatic lobe lesion on image number 56 measures a maximum of 10 mm. Initially  measured 21 mm on 01/01/2013 and measured a maximum of 11 mm on the recent prior study. No new hepatic metastatic lesions. Small cysts are stable. Stable probable vascular anomaly in the left hepatic lobe with adjacent cystic change. No biliary dilatation. The gallbladder is unremarkable. No common bile duct dilatation. The pancreas is normal. The spleen is unremarkable. Stable bilateral adrenal gland lesions.  The stomach, duodenum, small bowel and colon are grossly normal and stable. No mesenteric or retroperitoneal mass or lymphadenopathy. Small scattered lymph nodes are unchanged. The aorta and branch vessels are patent. Moderate atherosclerotic changes again demonstrated. No pelvic mass or adenopathy. The uterus and ovaries are grossly normal. Left inguinal lymph nodes are noted. The bladder is normal.  Stable sclerotic metastatic bone lesions involving the spine and pelvis. No pathologic fracture.    IMPRESSION: Overall, stable CT appearance of the chest. No progressive lesions or new lesions. No adenopathy.  Resolution of pericardial effusion and stable small left pleural effusion.  Stable small right hepatic lobe metastatic lesion and bilateral adrenal gland lesions.  Stable sclerotic osseous metastatic disease.   Electronically Signed   By: Kalman Jewels M.D.   On: 06/04/2013 09:27     ASSESSMENT AND PLAN: this is a very pleasant 73 years old white female with extensive stage small cell lung cancer status post 4 cycles of systemic chemotherapy with carboplatin and etoposide with significant improvement in her disease. She was on observation for few months and the patient had evidence for disease progression on restaging scan. She was started on second line chemotherapy with cisplatin and irinotecan currently status post 6 cycles. The patient is doing fine today and tolerating her treatment with cisplatin and irinotecan well except for neutropenia. Her recent CT scan of the chest, abdomen and  pelvis showed significant improvement in her disease with no evidence for disease progression. I discussed the scan results with the patient and her daughter today. I recommended for her to take a break off chemotherapy for the next 2 months with repeat staging workup at that time for evaluation of her disease. If she has evidence for disease progression, we will resume her systemic chemotherapy again, otherwise she can continue on observation. The patient and her daughter agreed to the current plan. She was advised to call immediately if she has any concerning symptoms in the interval. The patient voices understanding of current disease status and treatment options and is in agreement with the current care plan.  Disclaimer: This note was dictated with voice recognition software. Similar sounding words can inadvertently be transcribed and may not be corrected upon review.

## 2013-06-25 ENCOUNTER — Telehealth: Payer: Self-pay | Admitting: Internal Medicine

## 2013-06-25 NOTE — Telephone Encounter (Signed)
s.w. pt and advised on all appts....pt ok and aware

## 2013-06-28 ENCOUNTER — Ambulatory Visit: Payer: Medicare Other

## 2013-06-28 ENCOUNTER — Ambulatory Visit: Payer: Medicare Other | Admitting: Internal Medicine

## 2013-06-28 ENCOUNTER — Other Ambulatory Visit: Payer: Medicare Other

## 2013-07-05 ENCOUNTER — Ambulatory Visit: Payer: Medicare Other

## 2013-07-05 ENCOUNTER — Other Ambulatory Visit: Payer: Medicare Other

## 2013-07-12 ENCOUNTER — Other Ambulatory Visit: Payer: Medicare Other

## 2013-07-23 ENCOUNTER — Telehealth: Payer: Self-pay | Admitting: Internal Medicine

## 2013-07-23 NOTE — Telephone Encounter (Signed)
s.w pt and advised on 8.12 appt moved to 8.17 due to MD on pal....pt ok and aware

## 2013-07-24 ENCOUNTER — Telehealth: Payer: Self-pay | Admitting: Medical Oncology

## 2013-07-24 ENCOUNTER — Other Ambulatory Visit: Payer: Self-pay | Admitting: Medical Oncology

## 2013-07-24 ENCOUNTER — Other Ambulatory Visit: Payer: Self-pay | Admitting: Radiation Therapy

## 2013-07-24 DIAGNOSIS — C7949 Secondary malignant neoplasm of other parts of nervous system: Principal | ICD-10-CM

## 2013-07-24 DIAGNOSIS — C7931 Secondary malignant neoplasm of brain: Secondary | ICD-10-CM

## 2013-07-24 NOTE — Progress Notes (Signed)
Cheryl Nielsen and Cheryl Nielsen are asking to move up f/u appt closer to scan. Onc Tx request sent.

## 2013-07-24 NOTE — Telephone Encounter (Signed)
Pt and daughter request to move up f/u appt. Onc Tx request sent

## 2013-07-25 ENCOUNTER — Telehealth: Payer: Self-pay | Admitting: Internal Medicine

## 2013-07-25 NOTE — Telephone Encounter (Signed)
Lft msg for pt change of apt from 09/03/13 to 08/23/13 per 07/24/13 POF, also mailed ltr to pt.Marland Kitchen..KJ

## 2013-07-30 ENCOUNTER — Telehealth: Payer: Self-pay | Admitting: Internal Medicine

## 2013-07-30 ENCOUNTER — Telehealth: Payer: Self-pay | Admitting: *Deleted

## 2013-07-30 NOTE — Telephone Encounter (Signed)
S/w the pt's dtr and she is aware of the flush appt this wed and to stop by to pick up the rest of the schedule for the aug,sept.

## 2013-07-30 NOTE — Telephone Encounter (Signed)
Pt's daughter called stating that pt needs port flushes scheduled, onc tx schedule filled out.  SLJ

## 2013-07-31 ENCOUNTER — Other Ambulatory Visit: Payer: Self-pay | Admitting: Medical Oncology

## 2013-07-31 ENCOUNTER — Telehealth: Payer: Self-pay | Admitting: Medical Oncology

## 2013-07-31 DIAGNOSIS — C3490 Malignant neoplasm of unspecified part of unspecified bronchus or lung: Secondary | ICD-10-CM

## 2013-07-31 NOTE — Telephone Encounter (Signed)
Orders entered

## 2013-07-31 NOTE — Telephone Encounter (Signed)
Pt does not feel well . She reports that she is very tired and gets SOB waling short distances in her house. Daughter requesting labs tomorrow at port flush appt.

## 2013-08-01 ENCOUNTER — Ambulatory Visit (HOSPITAL_BASED_OUTPATIENT_CLINIC_OR_DEPARTMENT_OTHER): Payer: Medicare Other

## 2013-08-01 ENCOUNTER — Telehealth: Payer: Self-pay | Admitting: Medical Oncology

## 2013-08-01 ENCOUNTER — Ambulatory Visit: Payer: Medicare Other

## 2013-08-01 VITALS — BP 145/85 | HR 79 | Temp 97.0°F

## 2013-08-01 DIAGNOSIS — C787 Secondary malignant neoplasm of liver and intrahepatic bile duct: Secondary | ICD-10-CM

## 2013-08-01 DIAGNOSIS — C7949 Secondary malignant neoplasm of other parts of nervous system: Secondary | ICD-10-CM

## 2013-08-01 DIAGNOSIS — Z95828 Presence of other vascular implants and grafts: Secondary | ICD-10-CM

## 2013-08-01 DIAGNOSIS — C7931 Secondary malignant neoplasm of brain: Secondary | ICD-10-CM

## 2013-08-01 DIAGNOSIS — C3490 Malignant neoplasm of unspecified part of unspecified bronchus or lung: Secondary | ICD-10-CM

## 2013-08-01 LAB — CBC WITH DIFFERENTIAL/PLATELET
BASO%: 0.9 % (ref 0.0–2.0)
Basophils Absolute: 0 10*3/uL (ref 0.0–0.1)
EOS ABS: 0 10*3/uL (ref 0.0–0.5)
EOS%: 1 % (ref 0.0–7.0)
HEMATOCRIT: 36.6 % (ref 34.8–46.6)
HGB: 11.8 g/dL (ref 11.6–15.9)
LYMPH%: 14.9 % (ref 14.0–49.7)
MCH: 30.2 pg (ref 25.1–34.0)
MCHC: 32.2 g/dL (ref 31.5–36.0)
MCV: 93.9 fL (ref 79.5–101.0)
MONO#: 0.6 10*3/uL (ref 0.1–0.9)
MONO%: 14.6 % — AB (ref 0.0–14.0)
NEUT%: 68.6 % (ref 38.4–76.8)
NEUTROS ABS: 2.9 10*3/uL (ref 1.5–6.5)
PLATELETS: 208 10*3/uL (ref 145–400)
RBC: 3.9 10*6/uL (ref 3.70–5.45)
RDW: 12.8 % (ref 11.2–14.5)
WBC: 4.2 10*3/uL (ref 3.9–10.3)
lymph#: 0.6 10*3/uL — ABNORMAL LOW (ref 0.9–3.3)

## 2013-08-01 LAB — COMPREHENSIVE METABOLIC PANEL (CC13)
ALT: 14 U/L (ref 0–55)
AST: 19 U/L (ref 5–34)
Albumin: 3.7 g/dL (ref 3.5–5.0)
Alkaline Phosphatase: 92 U/L (ref 40–150)
Anion Gap: 8 meq/L (ref 3–11)
BUN: 22 mg/dL (ref 7.0–26.0)
CO2: 27 meq/L (ref 22–29)
Calcium: 9.5 mg/dL (ref 8.4–10.4)
Chloride: 100 meq/L (ref 98–109)
Creatinine: 1.2 mg/dL — ABNORMAL HIGH (ref 0.6–1.1)
Glucose: 74 mg/dL (ref 70–140)
Potassium: 4.4 meq/L (ref 3.5–5.1)
Sodium: 134 meq/L — ABNORMAL LOW (ref 136–145)
Total Bilirubin: 0.31 mg/dL (ref 0.20–1.20)
Total Protein: 6.6 g/dL (ref 6.4–8.3)

## 2013-08-01 MED ORDER — HEPARIN SOD (PORK) LOCK FLUSH 100 UNIT/ML IV SOLN
500.0000 [IU] | Freq: Once | INTRAVENOUS | Status: AC
  Administered 2013-08-01: 500 [IU] via INTRAVENOUS
  Filled 2013-08-01: qty 5

## 2013-08-01 MED ORDER — SODIUM CHLORIDE 0.9 % IJ SOLN
10.0000 mL | INTRAMUSCULAR | Status: DC | PRN
  Administered 2013-08-01: 10 mL via INTRAVENOUS
  Filled 2013-08-01: qty 10

## 2013-08-01 NOTE — Telephone Encounter (Signed)
Labs reviewed with pt . Instructed her to drink more non caffeine fluids. At least 64 oz/day

## 2013-08-01 NOTE — Patient Instructions (Signed)

## 2013-08-22 ENCOUNTER — Other Ambulatory Visit (HOSPITAL_BASED_OUTPATIENT_CLINIC_OR_DEPARTMENT_OTHER): Payer: Medicare Other

## 2013-08-22 ENCOUNTER — Ambulatory Visit (HOSPITAL_COMMUNITY)
Admission: RE | Admit: 2013-08-22 | Discharge: 2013-08-22 | Disposition: A | Discharge status: Home / Self Care | Payer: Medicare Other | Source: Ambulatory Visit | Attending: Internal Medicine | Admitting: Internal Medicine

## 2013-08-22 ENCOUNTER — Encounter (HOSPITAL_COMMUNITY): Payer: Self-pay

## 2013-08-22 ENCOUNTER — Other Ambulatory Visit (HOSPITAL_COMMUNITY): Payer: Medicare Other

## 2013-08-22 DIAGNOSIS — N281 Cyst of kidney, acquired: Secondary | ICD-10-CM | POA: Insufficient documentation

## 2013-08-22 DIAGNOSIS — C7951 Secondary malignant neoplasm of bone: Secondary | ICD-10-CM | POA: Insufficient documentation

## 2013-08-22 DIAGNOSIS — C7949 Secondary malignant neoplasm of other parts of nervous system: Secondary | ICD-10-CM

## 2013-08-22 DIAGNOSIS — C3490 Malignant neoplasm of unspecified part of unspecified bronchus or lung: Secondary | ICD-10-CM | POA: Diagnosis present

## 2013-08-22 DIAGNOSIS — C7952 Secondary malignant neoplasm of bone marrow: Secondary | ICD-10-CM

## 2013-08-22 DIAGNOSIS — K7689 Other specified diseases of liver: Secondary | ICD-10-CM | POA: Insufficient documentation

## 2013-08-22 DIAGNOSIS — K5730 Diverticulosis of large intestine without perforation or abscess without bleeding: Secondary | ICD-10-CM | POA: Insufficient documentation

## 2013-08-22 DIAGNOSIS — C7970 Secondary malignant neoplasm of unspecified adrenal gland: Secondary | ICD-10-CM | POA: Diagnosis not present

## 2013-08-22 DIAGNOSIS — C787 Secondary malignant neoplasm of liver and intrahepatic bile duct: Secondary | ICD-10-CM

## 2013-08-22 DIAGNOSIS — C7931 Secondary malignant neoplasm of brain: Secondary | ICD-10-CM

## 2013-08-22 LAB — CBC WITH DIFFERENTIAL/PLATELET
BASO%: 0.8 % (ref 0.0–2.0)
Basophils Absolute: 0 10*3/uL (ref 0.0–0.1)
EOS ABS: 0.1 10*3/uL (ref 0.0–0.5)
EOS%: 1.8 % (ref 0.0–7.0)
HEMATOCRIT: 39.5 % (ref 34.8–46.6)
HEMOGLOBIN: 12.9 g/dL (ref 11.6–15.9)
LYMPH%: 20 % (ref 14.0–49.7)
MCH: 29.9 pg (ref 25.1–34.0)
MCHC: 32.7 g/dL (ref 31.5–36.0)
MCV: 91.5 fL (ref 79.5–101.0)
MONO#: 0.5 10*3/uL (ref 0.1–0.9)
MONO%: 13.3 % (ref 0.0–14.0)
NEUT%: 64.1 % (ref 38.4–76.8)
NEUTROS ABS: 2.2 10*3/uL (ref 1.5–6.5)
PLATELETS: 220 10*3/uL (ref 145–400)
RBC: 4.32 10*6/uL (ref 3.70–5.45)
RDW: 12.5 % (ref 11.2–14.5)
WBC: 3.4 10*3/uL — ABNORMAL LOW (ref 3.9–10.3)
lymph#: 0.7 10*3/uL — ABNORMAL LOW (ref 0.9–3.3)

## 2013-08-22 LAB — COMPREHENSIVE METABOLIC PANEL (CC13)
ALT: 14 U/L (ref 0–55)
ANION GAP: 9 meq/L (ref 3–11)
AST: 20 U/L (ref 5–34)
Albumin: 3.8 g/dL (ref 3.5–5.0)
Alkaline Phosphatase: 93 U/L (ref 40–150)
BILIRUBIN TOTAL: 0.33 mg/dL (ref 0.20–1.20)
BUN: 12.7 mg/dL (ref 7.0–26.0)
CHLORIDE: 97 meq/L — AB (ref 98–109)
CO2: 29 meq/L (ref 22–29)
CREATININE: 1.1 mg/dL (ref 0.6–1.1)
Calcium: 10 mg/dL (ref 8.4–10.4)
GLUCOSE: 89 mg/dL (ref 70–140)
Potassium: 4.3 mEq/L (ref 3.5–5.1)
Sodium: 134 mEq/L — ABNORMAL LOW (ref 136–145)
TOTAL PROTEIN: 7.3 g/dL (ref 6.4–8.3)

## 2013-08-22 MED ORDER — IOHEXOL 300 MG/ML  SOLN
100.0000 mL | Freq: Once | INTRAMUSCULAR | Status: AC | PRN
  Administered 2013-08-22: 100 mL via INTRAVENOUS

## 2013-08-22 MED ORDER — IOHEXOL 300 MG/ML  SOLN
50.0000 mL | Freq: Once | INTRAMUSCULAR | Status: AC | PRN
  Administered 2013-08-22: 50 mL via ORAL

## 2013-08-23 ENCOUNTER — Ambulatory Visit (HOSPITAL_BASED_OUTPATIENT_CLINIC_OR_DEPARTMENT_OTHER): Payer: Medicare Other | Admitting: Internal Medicine

## 2013-08-23 ENCOUNTER — Encounter: Payer: Self-pay | Admitting: Internal Medicine

## 2013-08-23 VITALS — BP 131/88 | HR 94 | Temp 97.9°F | Resp 18 | Ht 62.0 in | Wt 113.6 lb

## 2013-08-23 DIAGNOSIS — C3492 Malignant neoplasm of unspecified part of left bronchus or lung: Secondary | ICD-10-CM

## 2013-08-23 DIAGNOSIS — C7949 Secondary malignant neoplasm of other parts of nervous system: Secondary | ICD-10-CM

## 2013-08-23 DIAGNOSIS — C7931 Secondary malignant neoplasm of brain: Secondary | ICD-10-CM

## 2013-08-23 DIAGNOSIS — C787 Secondary malignant neoplasm of liver and intrahepatic bile duct: Secondary | ICD-10-CM

## 2013-08-23 DIAGNOSIS — C3490 Malignant neoplasm of unspecified part of unspecified bronchus or lung: Secondary | ICD-10-CM

## 2013-08-23 DIAGNOSIS — J90 Pleural effusion, not elsewhere classified: Secondary | ICD-10-CM

## 2013-08-23 NOTE — Progress Notes (Signed)
[facility] [phone]   [phone]  OFFICE PROGRESS NOTE   DIAGNOSIS AND STAGE: Extensive stage small cell lung cancer with large obstructing Center left lung mass with large left pleural effusion, liver and brain metastasis diagnosed in June of 2014   PRIOR THERAPY:  1) Status post whole brain irradiation under the care of Dr. Isidore Moos completed 09/11/2012.  2) whole brain irradiation under the care of Dr. Isidore Moos expected to be completed on 09/11/2012.  3) Systemic chemotherapy with carboplatin for AUC of 5 on day 1 and etoposide 120 mg/M2 on days 1, 2 and 3 with Neulasta support on day 4, status post 4 cycles, last cycle was given on 10/11/2012 with partial response.   CURRENT THERAPY:  Systemic chemotherapy with cisplatin 30 mg/M2 and irinotecan 65 mg/M2 on days 1 and 8 every 3 weeks, status post 6 cycles. First dose 01/16/2013.  CHEMOTHERAPY INTENT: Palliative  CURRENT # OF CHEMOTHERAPY CYCLES: 0 CURRENT ANTIEMETICS: Zofran, dexamethasone and Compazine  CURRENT SMOKING STATUS: Former smoker  ORAL CHEMOTHERAPY AND CONSENT: None  CURRENT BISPHOSPHONATES USE: None  PAIN MANAGEMENT: 0/10 on Vicodin  NARCOTICS INDUCED CONSTIPATION: None  LIVING WILL AND CODE STATUS: no CODE BLUE   INTERVAL HISTORY: Cheryl Nielsen 73 y.o. female returns to the clinic today for follow up visit accompanied by her daughter. The patient has been on observation for the last 3 months. She has been doing very well with no specific complaints except for mild fatigue. She denied having any significant chest pain, shortness of breath, cough or hemoptysis. She has no weight loss or night sweats. She has no nausea or vomiting. She has no fever or chills. She completed 6 cycles of her systemic chemotherapy. She had repeat CT scan of the chest, abdomen and pelvis performed recently and she is here for evaluation and discussion of her scan results.  MEDICAL HISTORY: Past Medical  History  Diagnosis Date  . Pneumonia, organism unspecified   . Status post chemotherapy  10/11/2012     carboplatin  . S/P radiation therapy 08/24/2012-09/11/2012    Whole Brain  / 32.5 Gy in 13 fractions  . Lung cancer 07/05/12    Left Mainstem Bronchus- Small Cell Carcinoma    ALLERGIES:  is allergic to asa; codeine; and tramadol.  MEDICATIONS:  Current Outpatient Prescriptions  Medication Sig Dispense Refill  . acetaminophen (TYLENOL) 500 MG tablet Take 1,000 mg by mouth every 6 (six) hours as needed (For headache.).      Marland Kitchen glucosamine-chondroitin 500-400 MG tablet Take 1 tablet by mouth daily with lunch.       . Multiple Vitamin (MULTIVITAMIN WITH MINERALS) TABS Take 1 tablet by mouth daily with lunch. She takes Dance movement psychotherapist Adult 50+.      . Omega-3 Fatty Acids (FISH OIL CONCENTRATE PO) Take by mouth. Pt unsure of dose, takes daily      . diphenoxylate-atropine (LOMOTIL) 2.5-0.025 MG per tablet Take 1 tablet by mouth 2 (two) times daily as needed for diarrhea or loose stools.  60 tablet  1  . lidocaine-prilocaine (EMLA) cream Apply 1 application topically daily as needed (Applies to port-a-cath.).  30 g  0  . ondansetron (ZOFRAN) 8 MG tablet Take 1 tablet (8 mg total) by mouth every 8 (eight) hours as needed for nausea or vomiting.  30 tablet  0   No current facility-administered medications for this visit.    SURGICAL HISTORY:  Past Surgical History  Procedure Laterality  Date  . Nasal sinus surgery    . Video bronchoscopy Bilateral 07/05/2012    Procedure: VIDEO BRONCHOSCOPY WITHOUT FLUORO;  Surgeon: Tanda Rockers, MD;  Location: [facility];  Service: Cardiopulmonary;  Laterality: Bilateral;  . Portacath placement      REVIEW OF SYSTEMS:  Constitutional: positive for fatigue Eyes: negative Ears, nose, mouth, throat, and face: negative Respiratory: positive for dyspnea on exertion Cardiovascular: negative Gastrointestinal:  negative Genitourinary:negative Integument/breast: negative Hematologic/lymphatic: negative Musculoskeletal:negative Neurological: negative Behavioral/Psych: negative Endocrine: negative Allergic/Immunologic: negative   PHYSICAL EXAMINATION: General appearance: alert, cooperative, fatigued and no distress Head: Normocephalic, without obvious abnormality, atraumatic Neck: no adenopathy, no JVD, supple, symmetrical, trachea midline and thyroid not enlarged, symmetric, no tenderness/mass/nodules Lymph nodes: Cervical, supraclavicular, and axillary nodes normal. Resp: clear to auscultation bilaterally Back: symmetric, no curvature. ROM normal. No CVA tenderness. Cardio: regular rate and rhythm, S1, S2 normal, no murmur, click, rub or gallop GI: soft, non-tender; bowel sounds normal; no masses,  no organomegaly Extremities: extremities normal, atraumatic, no cyanosis or edema Neurologic: Alert and oriented X 3, normal strength and tone. Normal symmetric reflexes. Normal coordination and gait  ECOG PERFORMANCE STATUS: 1 - Symptomatic but completely ambulatory  Blood pressure 131/88, pulse 94, temperature 97.9 F (36.6 C), temperature source Oral, resp. rate 18, height 5\' 2"  (1.575 m), weight 113 lb 9.6 oz (51.529 kg), SpO2 98.00%.  LABORATORY DATA: Lab Results  Component Value Date   WBC 3.4* 08/22/2013   HGB 12.9 08/22/2013   HCT 39.5 08/22/2013   MCV 91.5 08/22/2013   PLT 220 08/22/2013      Chemistry      Component Value Date/Time   NA 134* 08/22/2013 0917   NA 132* 11/17/2012 1228   K 4.3 08/22/2013 0917   K 4.6 11/17/2012 1228   CL 96 11/17/2012 1228   CL 93* 07/10/2012 1544   CO2 29 08/22/2013 0917   CO2 26 11/17/2012 1228   BUN 12.7 08/22/2013 0917   BUN 16 11/17/2012 1228   CREATININE 1.1 08/22/2013 0917   CREATININE 1.05 11/17/2012 1228      Component Value Date/Time   CALCIUM 10.0 08/22/2013 0917   CALCIUM 9.9 11/17/2012 1228   ALKPHOS 93 08/22/2013 0917   AST 20 08/22/2013 0917    ALT 14 08/22/2013 0917   BILITOT 0.33 08/22/2013 0917       RADIOGRAPHIC STUDIES: Ct Chest W Contrast  08/22/2013   CLINICAL DATA:  Followup lung cancer  EXAM: CT CHEST, ABDOMEN, AND PELVIS WITH CONTRAST  TECHNIQUE: Multidetector CT imaging of the chest, abdomen and pelvis was performed following the standard protocol during bolus administration of intravenous contrast.  CONTRAST:  53mL OMNIPAQUE IOHEXOL 300 MG/ML SOLN, 134mL OMNIPAQUE IOHEXOL 300 MG/ML SOLN  COMPARISON:  05/25/2013  FINDINGS: CT CHEST FINDINGS  There is no pleural effusion identified. Postoperative change and volume loss involving the left hemi thorax is again identified. Index lesion within the left upper lobe measures 2.8 x 1.2 cm, image 15/series 5. On the previous examination this measured 2.7 x 0.7 cm. Within the adjacent left upper lobe and lingula there are new suspicious areas of nodularity. For example, there is a new 1 x 0.9 cm nodule, image 25/series 5. Within the left lower lobe there is a new nodule measuring 1.1 x 0.7 cm. Within the left apex there is a new 9 mm subpleural nodule, image 8. Stable spiculated density within the right apex , image 10/series 5.  The heart size appears normal. No  pericardial effusion. New pre-vascular adenopathy within the anterior mediastinum measures 3 x 2.1 cm, image 15/series 2. Left paratracheal adenopathy is also new measuring 2 cm, image 12/series 2. There is a new left supraclavicular lymph node which measures 0.8 cm, image 6/series 2.  CT ABDOMEN AND PELVIS FINDINGS  Scattered low attenuation structures within the liver are noted. Most of these represent simple cysts. Previous index lesion within segment 7 of the liver has resolved. There is a new area of metastasis within segment 6 which measures 1 cm, image 66/series 2. The gallbladder appears normal. No biliary dilatation normal appearance of the pancreas. The spleen is unremarkable.  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  Previously 2.1 x 1.3 cm. Metastasis to the left adrenal gland measure 2 x 1.2 cm, image 47/ series 2. Previously 1.7 x 1.1 cm. Bilateral renal cysts are again identified. The urinary bladder appears normal. The uterus and adnexal structures are on unremarkable.  Calcified atherosclerotic disease involves the abdominal aorta. No aneurysm. No retroperitoneal or pelvic adenopathy.  The stomach appears normal. The small bowel loops have a normal course and caliber. The appendix is visualized and appears normal. Normal appearance of the proximal colon. Multiple distal colonic diverticula are identified.  Review of the visualized bony structures shows multifocal areas of sclerotic bone metastases. The overall appearance is not significantly changed from previous exam.  IMPRESSION: 1. Interval progression of tumor within the left lung. 2. New anterior mediastinal metastatic adenopathy. 3. Disease within the liver demonstrates a mixed response. Previously noted liver metastasis has resolved, but there is a new lesion within segment 6 of the liver. 4. Mild increase in size of adrenal gland metastasis. 5. No significant change in the appearance of multi focal sclerotic bone metastasis.   Electronically Signed   By: Kerby Moors M.D.   On: 08/22/2013 12:10   Ct Abdomen Pelvis W Contrast  08/22/2013   CLINICAL DATA:  Followup lung cancer  EXAM: CT CHEST, ABDOMEN, AND PELVIS WITH CONTRAST  TECHNIQUE: Multidetector CT imaging of the chest, abdomen and pelvis was performed following the standard protocol during bolus administration of intravenous contrast.  CONTRAST:  42mL OMNIPAQUE IOHEXOL 300 MG/ML SOLN, 15mL OMNIPAQUE IOHEXOL 300 MG/ML SOLN  COMPARISON:  05/25/2013  FINDINGS: CT CHEST FINDINGS  There is no pleural effusion identified. Postoperative change and volume loss involving the left hemi thorax is again identified. Index lesion within the left upper lobe measures 2.8 x 1.2 cm, image 15/series 5. On the previous  examination this measured 2.7 x 0.7 cm. Within the adjacent left upper lobe and lingula there are new suspicious areas of nodularity. For example, there is a new 1 x 0.9 cm nodule, image 25/series 5. Within the left lower lobe there is a new nodule measuring 1.1 x 0.7 cm. Within the left apex there is a new 9 mm subpleural nodule, image 8. Stable spiculated density within the right apex , image 10/series 5.  The heart size appears normal. No pericardial effusion. New pre-vascular adenopathy within the anterior mediastinum measures 3 x 2.1 cm, image 15/series 2. Left paratracheal adenopathy is also new measuring 2 cm, image 12/series 2. There is a new left supraclavicular lymph node which measures 0.8 cm, image 6/series 2.  CT ABDOMEN AND PELVIS FINDINGS  Scattered low attenuation structures within the liver are noted. Most of these represent simple cysts. Previous index lesion within segment 7 of the liver has resolved. There is a new area of metastasis within  segment 6 which measures 1 cm, image 66/series 2. The gallbladder appears normal. No biliary dilatation normal appearance of the pancreas. The spleen is unremarkable.  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 Previously 2.1 x 1.3 cm. Metastasis to the left adrenal gland measure 2 x 1.2 cm, image 47/ series 2. Previously 1.7 x 1.1 cm. Bilateral renal cysts are again identified. The urinary bladder appears normal. The uterus and adnexal structures are on unremarkable.  Calcified atherosclerotic disease involves the abdominal aorta. No aneurysm. No retroperitoneal or pelvic adenopathy.  The stomach appears normal. The small bowel loops have a normal course and caliber. The appendix is visualized and appears normal. Normal appearance of the proximal colon. Multiple distal colonic diverticula are identified.  Review of the visualized bony structures shows multifocal areas of sclerotic bone metastases. The overall appearance is not significantly changed  from previous exam.  IMPRESSION: 1. Interval progression of tumor within the left lung. 2. New anterior mediastinal metastatic adenopathy. 3. Disease within the liver demonstrates a mixed response. Previously noted liver metastasis has resolved, but there is a new lesion within segment 6 of the liver. 4. Mild increase in size of adrenal gland metastasis. 5. No significant change in the appearance of multi focal sclerotic bone metastasis.   Electronically Signed   By: Kerby Moors M.D.   On: 08/22/2013 12:10    ASSESSMENT AND PLAN: this is a very pleasant 73 years old white female with extensive stage small cell lung cancer status post 4 cycles of systemic chemotherapy with carboplatin and etoposide with significant improvement in her disease. She was on observation for few months and the patient had evidence for disease progression on restaging scan. She was started on second line chemotherapy with cisplatin and irinotecan currently status post 6 cycles with good response. She has been observation for the last 3 months. Her recent CT scan of the chest, abdomen and pelvis showed evidence for disease progression. I discussed the scan results with the patient and her daughter. I gave her the option of continuous observation and palliative care versus resuming systemic chemotherapy with cisplatin and irinotecan again. The patient is interested in resuming her treatment. She is expected to start the first cycle of this treatment next week. She would come back for followup visit in 4 weeks with the start of cycle #2. She was advised to call immediately if she has any concerning symptoms in the interval. The patient voices understanding of current disease status and treatment options and is in agreement with the current care plan.  Disclaimer: This note was dictated with voice recognition software. Similar sounding words can inadvertently be transcribed and may not be corrected upon review.

## 2013-08-24 ENCOUNTER — Telehealth: Payer: Self-pay | Admitting: Internal Medicine

## 2013-08-24 NOTE — Telephone Encounter (Signed)
s.w. pt and advised on Aug appt....pt ok and aware....will pick up sched at nxt visit

## 2013-08-29 ENCOUNTER — Ambulatory Visit: Payer: Medicare Other | Admitting: Internal Medicine

## 2013-08-30 ENCOUNTER — Other Ambulatory Visit (HOSPITAL_BASED_OUTPATIENT_CLINIC_OR_DEPARTMENT_OTHER): Payer: Medicare Other

## 2013-08-30 ENCOUNTER — Ambulatory Visit (HOSPITAL_BASED_OUTPATIENT_CLINIC_OR_DEPARTMENT_OTHER): Payer: Medicare Other

## 2013-08-30 ENCOUNTER — Other Ambulatory Visit: Payer: Medicare Other

## 2013-08-30 ENCOUNTER — Encounter: Payer: Self-pay | Admitting: Internal Medicine

## 2013-08-30 VITALS — BP 145/87 | HR 96 | Temp 98.3°F | Resp 17

## 2013-08-30 DIAGNOSIS — Z5111 Encounter for antineoplastic chemotherapy: Secondary | ICD-10-CM

## 2013-08-30 DIAGNOSIS — C3490 Malignant neoplasm of unspecified part of unspecified bronchus or lung: Secondary | ICD-10-CM

## 2013-08-30 DIAGNOSIS — C3492 Malignant neoplasm of unspecified part of left bronchus or lung: Secondary | ICD-10-CM

## 2013-08-30 DIAGNOSIS — C787 Secondary malignant neoplasm of liver and intrahepatic bile duct: Secondary | ICD-10-CM

## 2013-08-30 DIAGNOSIS — C7949 Secondary malignant neoplasm of other parts of nervous system: Secondary | ICD-10-CM

## 2013-08-30 DIAGNOSIS — C7931 Secondary malignant neoplasm of brain: Secondary | ICD-10-CM

## 2013-08-30 LAB — CBC WITH DIFFERENTIAL/PLATELET
BASO%: 0.6 % (ref 0.0–2.0)
Basophils Absolute: 0 10*3/uL (ref 0.0–0.1)
EOS ABS: 0.1 10*3/uL (ref 0.0–0.5)
EOS%: 1.5 % (ref 0.0–7.0)
HCT: 40.3 % (ref 34.8–46.6)
HGB: 13.1 g/dL (ref 11.6–15.9)
LYMPH%: 17.2 % (ref 14.0–49.7)
MCH: 29.7 pg (ref 25.1–34.0)
MCHC: 32.5 g/dL (ref 31.5–36.0)
MCV: 91.4 fL (ref 79.5–101.0)
MONO#: 0.4 10*3/uL (ref 0.1–0.9)
MONO%: 7.7 % (ref 0.0–14.0)
NEUT#: 3.4 10*3/uL (ref 1.5–6.5)
NEUT%: 73 % (ref 38.4–76.8)
Platelets: 250 10*3/uL (ref 145–400)
RBC: 4.41 10*6/uL (ref 3.70–5.45)
RDW: 12.4 % (ref 11.2–14.5)
WBC: 4.7 10*3/uL (ref 3.9–10.3)
lymph#: 0.8 10*3/uL — ABNORMAL LOW (ref 0.9–3.3)

## 2013-08-30 LAB — COMPREHENSIVE METABOLIC PANEL (CC13)
ALT: 12 U/L (ref 0–55)
AST: 21 U/L (ref 5–34)
Albumin: 3.7 g/dL (ref 3.5–5.0)
Alkaline Phosphatase: 86 U/L (ref 40–150)
Anion Gap: 10 mEq/L (ref 3–11)
BILIRUBIN TOTAL: 0.23 mg/dL (ref 0.20–1.20)
BUN: 18.7 mg/dL (ref 7.0–26.0)
CO2: 27 mEq/L (ref 22–29)
CREATININE: 1.3 mg/dL — AB (ref 0.6–1.1)
Calcium: 9.8 mg/dL (ref 8.4–10.4)
Chloride: 98 mEq/L (ref 98–109)
GLUCOSE: 82 mg/dL (ref 70–140)
Potassium: 4.1 mEq/L (ref 3.5–5.1)
SODIUM: 135 meq/L — AB (ref 136–145)
Total Protein: 7.2 g/dL (ref 6.4–8.3)

## 2013-08-30 LAB — MAGNESIUM (CC13): MAGNESIUM: 2.1 mg/dL (ref 1.5–2.5)

## 2013-08-30 MED ORDER — SODIUM CHLORIDE 0.9 % IV SOLN
Freq: Once | INTRAVENOUS | Status: AC
  Administered 2013-08-30: 10:00:00 via INTRAVENOUS

## 2013-08-30 MED ORDER — SODIUM CHLORIDE 0.9 % IV SOLN
150.0000 mg | Freq: Once | INTRAVENOUS | Status: AC
  Administered 2013-08-30: 150 mg via INTRAVENOUS
  Filled 2013-08-30: qty 5

## 2013-08-30 MED ORDER — PALONOSETRON HCL INJECTION 0.25 MG/5ML
INTRAVENOUS | Status: AC
  Filled 2013-08-30: qty 5

## 2013-08-30 MED ORDER — ATROPINE SULFATE 1 MG/ML IJ SOLN
0.5000 mg | Freq: Once | INTRAMUSCULAR | Status: AC | PRN
  Administered 2013-08-30: 0.5 mg via INTRAVENOUS

## 2013-08-30 MED ORDER — DEXAMETHASONE SODIUM PHOSPHATE 20 MG/5ML IJ SOLN
12.0000 mg | Freq: Once | INTRAMUSCULAR | Status: AC
  Administered 2013-08-30: 12 mg via INTRAVENOUS

## 2013-08-30 MED ORDER — ATROPINE SULFATE 1 MG/ML IJ SOLN
INTRAMUSCULAR | Status: AC
  Filled 2013-08-30: qty 1

## 2013-08-30 MED ORDER — POTASSIUM CHLORIDE 2 MEQ/ML IV SOLN
Freq: Once | INTRAVENOUS | Status: AC
  Administered 2013-08-30: 11:00:00 via INTRAVENOUS
  Filled 2013-08-30: qty 10

## 2013-08-30 MED ORDER — HEPARIN SOD (PORK) LOCK FLUSH 100 UNIT/ML IV SOLN
500.0000 [IU] | Freq: Once | INTRAVENOUS | Status: AC | PRN
  Administered 2013-08-30: 500 [IU]
  Filled 2013-08-30: qty 5

## 2013-08-30 MED ORDER — SODIUM CHLORIDE 0.9 % IJ SOLN
10.0000 mL | INTRAMUSCULAR | Status: DC | PRN
  Administered 2013-08-30: 10 mL
  Filled 2013-08-30: qty 10

## 2013-08-30 MED ORDER — DEXTROSE 5 % IV SOLN
65.0000 mg/m2 | Freq: Once | INTRAVENOUS | Status: AC
  Administered 2013-08-30: 94 mg via INTRAVENOUS
  Filled 2013-08-30: qty 4.7

## 2013-08-30 MED ORDER — DEXAMETHASONE SODIUM PHOSPHATE 20 MG/5ML IJ SOLN
INTRAMUSCULAR | Status: AC
  Filled 2013-08-30: qty 5

## 2013-08-30 MED ORDER — CISPLATIN CHEMO INJECTION 100MG/100ML
25.0000 mg/m2 | Freq: Once | INTRAVENOUS | Status: AC
  Administered 2013-08-30: 36 mg via INTRAVENOUS
  Filled 2013-08-30: qty 36

## 2013-08-30 MED ORDER — PALONOSETRON HCL INJECTION 0.25 MG/5ML
0.2500 mg | Freq: Once | INTRAVENOUS | Status: AC
  Administered 2013-08-30: 0.25 mg via INTRAVENOUS

## 2013-08-30 NOTE — Progress Notes (Signed)
Put daughter's fmla form on nurse's desk.

## 2013-08-30 NOTE — Patient Instructions (Signed)
[facility] Discharge Instructions for Patients Receiving Chemotherapy  Today you received the following chemotherapy agents Irinotecan/Cisplatin To help prevent nausea and vomiting after your treatment, we encourage you to take your nausea medication as prescribed.If you develop nausea and vomiting that is not controlled by your nausea medication, call the clinic.   BELOW ARE SYMPTOMS THAT SHOULD BE REPORTED IMMEDIATELY:  *FEVER GREATER THAN 100.5 F  *CHILLS WITH OR WITHOUT FEVER  NAUSEA AND VOMITING THAT IS NOT CONTROLLED WITH YOUR NAUSEA MEDICATION  *UNUSUAL SHORTNESS OF BREATH  *UNUSUAL BRUISING OR BLEEDING  TENDERNESS IN MOUTH AND THROAT WITH OR WITHOUT PRESENCE OF ULCERS  *URINARY PROBLEMS  *BOWEL PROBLEMS  UNUSUAL RASH Items with * indicate a potential emergency and should be followed up as soon as possible.  Feel free to call the clinic you have any questions or concerns. The clinic phone number is [phone].

## 2013-08-31 ENCOUNTER — Encounter: Payer: Self-pay | Admitting: Internal Medicine

## 2013-08-31 NOTE — Progress Notes (Signed)
Faxed daughter's fmla form to [facility] @ [phone]

## 2013-09-03 ENCOUNTER — Ambulatory Visit: Payer: Medicare Other | Admitting: Internal Medicine

## 2013-09-06 ENCOUNTER — Other Ambulatory Visit (HOSPITAL_BASED_OUTPATIENT_CLINIC_OR_DEPARTMENT_OTHER): Payer: Medicare Other

## 2013-09-06 ENCOUNTER — Other Ambulatory Visit: Payer: Self-pay | Admitting: Internal Medicine

## 2013-09-06 ENCOUNTER — Other Ambulatory Visit: Payer: Medicare Other

## 2013-09-06 ENCOUNTER — Ambulatory Visit (HOSPITAL_BASED_OUTPATIENT_CLINIC_OR_DEPARTMENT_OTHER): Payer: Medicare Other

## 2013-09-06 VITALS — BP 120/73 | HR 79 | Temp 98.6°F

## 2013-09-06 DIAGNOSIS — Z5111 Encounter for antineoplastic chemotherapy: Secondary | ICD-10-CM

## 2013-09-06 DIAGNOSIS — C3492 Malignant neoplasm of unspecified part of left bronchus or lung: Secondary | ICD-10-CM

## 2013-09-06 DIAGNOSIS — C7931 Secondary malignant neoplasm of brain: Secondary | ICD-10-CM

## 2013-09-06 DIAGNOSIS — C7949 Secondary malignant neoplasm of other parts of nervous system: Secondary | ICD-10-CM

## 2013-09-06 DIAGNOSIS — C3490 Malignant neoplasm of unspecified part of unspecified bronchus or lung: Secondary | ICD-10-CM

## 2013-09-06 LAB — CBC WITH DIFFERENTIAL/PLATELET
BASO%: 0.7 % (ref 0.0–2.0)
Basophils Absolute: 0 10*3/uL (ref 0.0–0.1)
EOS%: 1.5 % (ref 0.0–7.0)
Eosinophils Absolute: 0.1 10*3/uL (ref 0.0–0.5)
HCT: 37.8 % (ref 34.8–46.6)
HGB: 12.3 g/dL (ref 11.6–15.9)
LYMPH%: 18.3 % (ref 14.0–49.7)
MCH: 29.2 pg (ref 25.1–34.0)
MCHC: 32.6 g/dL (ref 31.5–36.0)
MCV: 89.8 fL (ref 79.5–101.0)
MONO#: 0.5 10*3/uL (ref 0.1–0.9)
MONO%: 13.2 % (ref 0.0–14.0)
NEUT#: 2.7 10*3/uL (ref 1.5–6.5)
NEUT%: 66.3 % (ref 38.4–76.8)
PLATELETS: 217 10*3/uL (ref 145–400)
RBC: 4.21 10*6/uL (ref 3.70–5.45)
RDW: 12.6 % (ref 11.2–14.5)
WBC: 4 10*3/uL (ref 3.9–10.3)
lymph#: 0.7 10*3/uL — ABNORMAL LOW (ref 0.9–3.3)

## 2013-09-06 LAB — COMPREHENSIVE METABOLIC PANEL (CC13)
ALBUMIN: 3.5 g/dL (ref 3.5–5.0)
ALT: 18 U/L (ref 0–55)
ANION GAP: 10 meq/L (ref 3–11)
AST: 20 U/L (ref 5–34)
Alkaline Phosphatase: 94 U/L (ref 40–150)
BUN: 18.1 mg/dL (ref 7.0–26.0)
CO2: 27 meq/L (ref 22–29)
CREATININE: 1.2 mg/dL — AB (ref 0.6–1.1)
Calcium: 9.6 mg/dL (ref 8.4–10.4)
Chloride: 96 mEq/L — ABNORMAL LOW (ref 98–109)
GLUCOSE: 63 mg/dL — AB (ref 70–140)
POTASSIUM: 4.1 meq/L (ref 3.5–5.1)
Sodium: 133 mEq/L — ABNORMAL LOW (ref 136–145)
Total Bilirubin: <0.2 mg/dL (ref 0.20–1.20)
Total Protein: 6.9 g/dL (ref 6.4–8.3)

## 2013-09-06 LAB — MAGNESIUM (CC13): MAGNESIUM: 2 mg/dL (ref 1.5–2.5)

## 2013-09-06 MED ORDER — PALONOSETRON HCL INJECTION 0.25 MG/5ML
0.2500 mg | Freq: Once | INTRAVENOUS | Status: AC
  Administered 2013-09-06: 0.25 mg via INTRAVENOUS

## 2013-09-06 MED ORDER — DEXAMETHASONE SODIUM PHOSPHATE 20 MG/5ML IJ SOLN
INTRAMUSCULAR | Status: AC
Start: 2013-09-06 — End: 2013-09-06
  Filled 2013-09-06: qty 5

## 2013-09-06 MED ORDER — DEXAMETHASONE SODIUM PHOSPHATE 20 MG/5ML IJ SOLN
12.0000 mg | Freq: Once | INTRAMUSCULAR | Status: AC
  Administered 2013-09-06: 12 mg via INTRAVENOUS

## 2013-09-06 MED ORDER — SODIUM CHLORIDE 0.9 % IV SOLN
Freq: Once | INTRAVENOUS | Status: AC
  Administered 2013-09-06: 12:00:00 via INTRAVENOUS

## 2013-09-06 MED ORDER — PALONOSETRON HCL INJECTION 0.25 MG/5ML
INTRAVENOUS | Status: AC
  Filled 2013-09-06: qty 5

## 2013-09-06 MED ORDER — IRINOTECAN HCL CHEMO INJECTION 100 MG/5ML
65.0000 mg/m2 | Freq: Once | INTRAVENOUS | Status: AC
  Administered 2013-09-06: 94 mg via INTRAVENOUS
  Filled 2013-09-06: qty 4.7

## 2013-09-06 MED ORDER — DEXTROSE-NACL 5-0.45 % IV SOLN
Freq: Once | INTRAVENOUS | Status: AC
  Administered 2013-09-06: 11:00:00 via INTRAVENOUS
  Filled 2013-09-06: qty 10

## 2013-09-06 MED ORDER — SODIUM CHLORIDE 0.9 % IJ SOLN
10.0000 mL | INTRAMUSCULAR | Status: DC | PRN
  Administered 2013-09-06: 10 mL
  Filled 2013-09-06: qty 10

## 2013-09-06 MED ORDER — SODIUM CHLORIDE 0.9 % IV SOLN
150.0000 mg | Freq: Once | INTRAVENOUS | Status: AC
  Administered 2013-09-06: 150 mg via INTRAVENOUS
  Filled 2013-09-06: qty 5

## 2013-09-06 MED ORDER — HEPARIN SOD (PORK) LOCK FLUSH 100 UNIT/ML IV SOLN
500.0000 [IU] | Freq: Once | INTRAVENOUS | Status: AC | PRN
  Administered 2013-09-06: 500 [IU]
  Filled 2013-09-06: qty 5

## 2013-09-06 MED ORDER — ATROPINE SULFATE 1 MG/ML IJ SOLN
0.5000 mg | Freq: Once | INTRAMUSCULAR | Status: AC | PRN
  Administered 2013-09-06: 0.5 mg via INTRAVENOUS

## 2013-09-06 MED ORDER — SODIUM CHLORIDE 0.9 % IV SOLN
25.0000 mg/m2 | Freq: Once | INTRAVENOUS | Status: AC
  Administered 2013-09-06: 36 mg via INTRAVENOUS
  Filled 2013-09-06: qty 36

## 2013-09-06 MED ORDER — ATROPINE SULFATE 1 MG/ML IJ SOLN
INTRAMUSCULAR | Status: AC
  Filled 2013-09-06: qty 1

## 2013-09-06 NOTE — Patient Instructions (Signed)
[facility] Discharge Instructions for Patients Receiving Chemotherapy  Today you received the following chemotherapy agents: Irinotecan and    Cisplatin.  To help prevent nausea and vomiting after your treatment, we encourage you to take your nausea medication:  Zofran 8mg  every 8 hours as needed.   If you develop nausea and vomiting that is not controlled by your nausea medication, call the clinic.   BELOW ARE SYMPTOMS THAT SHOULD BE REPORTED IMMEDIATELY:  *FEVER GREATER THAN 100.5 F  *CHILLS WITH OR WITHOUT FEVER  NAUSEA AND VOMITING THAT IS NOT CONTROLLED WITH YOUR NAUSEA MEDICATION  *UNUSUAL SHORTNESS OF BREATH  *UNUSUAL BRUISING OR BLEEDING  TENDERNESS IN MOUTH AND THROAT WITH OR WITHOUT PRESENCE OF ULCERS  *URINARY PROBLEMS  *BOWEL PROBLEMS  UNUSUAL RASH Items with * indicate a potential emergency and should be followed up as soon as possible.  Feel free to call the clinic you have any questions or concerns. The clinic phone number is [phone].

## 2013-09-06 NOTE — Progress Notes (Signed)
Total Urine output pre-Cisplatin = 600cc  Total Urine output post- Cipsplatin = 500cc

## 2013-09-10 ENCOUNTER — Encounter: Payer: Self-pay | Admitting: *Deleted

## 2013-09-10 ENCOUNTER — Telehealth: Payer: Self-pay | Admitting: *Deleted

## 2013-09-10 NOTE — Telephone Encounter (Signed)
Patient's daughter called requesting a letter stating Brenna is currently undergoing chemo. They are trying to get her some assistance.

## 2013-09-13 ENCOUNTER — Other Ambulatory Visit: Payer: Medicare Other

## 2013-09-14 ENCOUNTER — Other Ambulatory Visit (HOSPITAL_BASED_OUTPATIENT_CLINIC_OR_DEPARTMENT_OTHER): Payer: Medicare Other

## 2013-09-14 DIAGNOSIS — C787 Secondary malignant neoplasm of liver and intrahepatic bile duct: Secondary | ICD-10-CM

## 2013-09-14 DIAGNOSIS — C3490 Malignant neoplasm of unspecified part of unspecified bronchus or lung: Secondary | ICD-10-CM

## 2013-09-14 DIAGNOSIS — C3492 Malignant neoplasm of unspecified part of left bronchus or lung: Secondary | ICD-10-CM

## 2013-09-14 LAB — CBC WITH DIFFERENTIAL/PLATELET
BASO%: 0 % (ref 0.0–2.0)
Basophils Absolute: 0 10*3/uL (ref 0.0–0.1)
EOS%: 1.2 % (ref 0.0–7.0)
Eosinophils Absolute: 0 10*3/uL (ref 0.0–0.5)
HCT: 35.3 % (ref 34.8–46.6)
HEMOGLOBIN: 11.9 g/dL (ref 11.6–15.9)
LYMPH%: 20.8 % (ref 14.0–49.7)
MCH: 29.7 pg (ref 25.1–34.0)
MCHC: 33.7 g/dL (ref 31.5–36.0)
MCV: 88 fL (ref 79.5–101.0)
MONO#: 0.4 10*3/uL (ref 0.1–0.9)
MONO%: 11.3 % (ref 0.0–14.0)
NEUT#: 2.3 10*3/uL (ref 1.5–6.5)
NEUT%: 66.7 % (ref 38.4–76.8)
PLATELETS: 224 10*3/uL (ref 145–400)
RBC: 4.01 10*6/uL (ref 3.70–5.45)
RDW: 12.5 % (ref 11.2–14.5)
WBC: 3.5 10*3/uL — ABNORMAL LOW (ref 3.9–10.3)
lymph#: 0.7 10*3/uL — ABNORMAL LOW (ref 0.9–3.3)

## 2013-09-14 LAB — COMPREHENSIVE METABOLIC PANEL (CC13)
ALT: 20 U/L (ref 0–55)
ANION GAP: 6 meq/L (ref 3–11)
AST: 19 U/L (ref 5–34)
Albumin: 3.5 g/dL (ref 3.5–5.0)
Alkaline Phosphatase: 103 U/L (ref 40–150)
BILIRUBIN TOTAL: 0.26 mg/dL (ref 0.20–1.20)
BUN: 14.4 mg/dL (ref 7.0–26.0)
CALCIUM: 9.2 mg/dL (ref 8.4–10.4)
CO2: 28 meq/L (ref 22–29)
CREATININE: 1 mg/dL (ref 0.6–1.1)
Chloride: 96 mEq/L — ABNORMAL LOW (ref 98–109)
Glucose: 72 mg/dl (ref 70–140)
Potassium: 4.3 mEq/L (ref 3.5–5.1)
Sodium: 131 mEq/L — ABNORMAL LOW (ref 136–145)
Total Protein: 6.6 g/dL (ref 6.4–8.3)

## 2013-09-14 LAB — MAGNESIUM (CC13): Magnesium: 1.9 mg/dl (ref 1.5–2.5)

## 2013-09-20 ENCOUNTER — Other Ambulatory Visit (HOSPITAL_BASED_OUTPATIENT_CLINIC_OR_DEPARTMENT_OTHER): Payer: Medicare Other

## 2013-09-20 ENCOUNTER — Ambulatory Visit: Payer: Medicare Other | Admitting: Internal Medicine

## 2013-09-20 ENCOUNTER — Telehealth: Payer: Self-pay | Admitting: Internal Medicine

## 2013-09-20 ENCOUNTER — Other Ambulatory Visit: Payer: Medicare Other

## 2013-09-20 ENCOUNTER — Ambulatory Visit (HOSPITAL_BASED_OUTPATIENT_CLINIC_OR_DEPARTMENT_OTHER): Payer: Medicare Other

## 2013-09-20 ENCOUNTER — Encounter: Payer: Self-pay | Admitting: Internal Medicine

## 2013-09-20 ENCOUNTER — Ambulatory Visit (HOSPITAL_BASED_OUTPATIENT_CLINIC_OR_DEPARTMENT_OTHER): Payer: Medicare Other | Admitting: Internal Medicine

## 2013-09-20 VITALS — BP 142/72 | HR 74 | Temp 97.9°F | Resp 19 | Ht 62.0 in | Wt 114.4 lb

## 2013-09-20 DIAGNOSIS — C3492 Malignant neoplasm of unspecified part of left bronchus or lung: Secondary | ICD-10-CM

## 2013-09-20 DIAGNOSIS — C3490 Malignant neoplasm of unspecified part of unspecified bronchus or lung: Secondary | ICD-10-CM

## 2013-09-20 DIAGNOSIS — C7931 Secondary malignant neoplasm of brain: Secondary | ICD-10-CM

## 2013-09-20 DIAGNOSIS — C7949 Secondary malignant neoplasm of other parts of nervous system: Secondary | ICD-10-CM

## 2013-09-20 DIAGNOSIS — Z5111 Encounter for antineoplastic chemotherapy: Secondary | ICD-10-CM

## 2013-09-20 DIAGNOSIS — C787 Secondary malignant neoplasm of liver and intrahepatic bile duct: Secondary | ICD-10-CM

## 2013-09-20 LAB — COMPREHENSIVE METABOLIC PANEL (CC13)
ALT: 15 U/L (ref 0–55)
AST: 18 U/L (ref 5–34)
Albumin: 3.5 g/dL (ref 3.5–5.0)
Alkaline Phosphatase: 92 U/L (ref 40–150)
Anion Gap: 7 mEq/L (ref 3–11)
BUN: 16.6 mg/dL (ref 7.0–26.0)
CALCIUM: 9.2 mg/dL (ref 8.4–10.4)
CO2: 27 mEq/L (ref 22–29)
CREATININE: 1.2 mg/dL — AB (ref 0.6–1.1)
Chloride: 101 mEq/L (ref 98–109)
Glucose: 77 mg/dl (ref 70–140)
Potassium: 4.4 mEq/L (ref 3.5–5.1)
Sodium: 135 mEq/L — ABNORMAL LOW (ref 136–145)
Total Bilirubin: 0.25 mg/dL (ref 0.20–1.20)
Total Protein: 6.4 g/dL (ref 6.4–8.3)

## 2013-09-20 LAB — CBC WITH DIFFERENTIAL/PLATELET
BASO%: 0.6 % (ref 0.0–2.0)
Basophils Absolute: 0 10*3/uL (ref 0.0–0.1)
EOS ABS: 0.1 10*3/uL (ref 0.0–0.5)
EOS%: 3.8 % (ref 0.0–7.0)
HCT: 34.5 % — ABNORMAL LOW (ref 34.8–46.6)
HEMOGLOBIN: 11.4 g/dL — AB (ref 11.6–15.9)
LYMPH%: 22.4 % (ref 14.0–49.7)
MCH: 29.6 pg (ref 25.1–34.0)
MCHC: 33.1 g/dL (ref 31.5–36.0)
MCV: 89.3 fL (ref 79.5–101.0)
MONO#: 0.4 10*3/uL (ref 0.1–0.9)
MONO%: 12.6 % (ref 0.0–14.0)
NEUT%: 60.6 % (ref 38.4–76.8)
NEUTROS ABS: 1.9 10*3/uL (ref 1.5–6.5)
PLATELETS: 207 10*3/uL (ref 145–400)
RBC: 3.87 10*6/uL (ref 3.70–5.45)
RDW: 13.2 % (ref 11.2–14.5)
WBC: 3.1 10*3/uL — ABNORMAL LOW (ref 3.9–10.3)
lymph#: 0.7 10*3/uL — ABNORMAL LOW (ref 0.9–3.3)

## 2013-09-20 LAB — MAGNESIUM (CC13): Magnesium: 2 mg/dl (ref 1.5–2.5)

## 2013-09-20 MED ORDER — DEXTROSE-NACL 5-0.45 % IV SOLN
Freq: Once | INTRAVENOUS | Status: AC
  Administered 2013-09-20: 11:00:00 via INTRAVENOUS
  Filled 2013-09-20: qty 10

## 2013-09-20 MED ORDER — CISPLATIN CHEMO INJECTION 100MG/100ML
25.0000 mg/m2 | Freq: Once | INTRAVENOUS | Status: AC
  Administered 2013-09-20: 36 mg via INTRAVENOUS
  Filled 2013-09-20: qty 36

## 2013-09-20 MED ORDER — IRINOTECAN HCL CHEMO INJECTION 100 MG/5ML
65.0000 mg/m2 | Freq: Once | INTRAVENOUS | Status: AC
  Administered 2013-09-20: 94 mg via INTRAVENOUS
  Filled 2013-09-20: qty 4.7

## 2013-09-20 MED ORDER — ATROPINE SULFATE 1 MG/ML IJ SOLN
INTRAMUSCULAR | Status: AC
  Filled 2013-09-20: qty 1

## 2013-09-20 MED ORDER — SODIUM CHLORIDE 0.9 % IV SOLN
150.0000 mg | Freq: Once | INTRAVENOUS | Status: AC
  Administered 2013-09-20: 150 mg via INTRAVENOUS
  Filled 2013-09-20: qty 5

## 2013-09-20 MED ORDER — SODIUM CHLORIDE 0.9 % IV SOLN
Freq: Once | INTRAVENOUS | Status: AC
  Administered 2013-09-20: 10:00:00 via INTRAVENOUS

## 2013-09-20 MED ORDER — PALONOSETRON HCL INJECTION 0.25 MG/5ML
0.2500 mg | Freq: Once | INTRAVENOUS | Status: AC
  Administered 2013-09-20: 0.25 mg via INTRAVENOUS

## 2013-09-20 MED ORDER — DEXAMETHASONE SODIUM PHOSPHATE 20 MG/5ML IJ SOLN
INTRAMUSCULAR | Status: AC
  Filled 2013-09-20: qty 5

## 2013-09-20 MED ORDER — ATROPINE SULFATE 1 MG/ML IJ SOLN
0.5000 mg | Freq: Once | INTRAMUSCULAR | Status: AC | PRN
  Administered 2013-09-20: 0.5 mg via INTRAVENOUS

## 2013-09-20 MED ORDER — DEXAMETHASONE SODIUM PHOSPHATE 20 MG/5ML IJ SOLN
12.0000 mg | Freq: Once | INTRAMUSCULAR | Status: AC
  Administered 2013-09-20: 12 mg via INTRAVENOUS

## 2013-09-20 MED ORDER — SODIUM CHLORIDE 0.9 % IJ SOLN
10.0000 mL | INTRAMUSCULAR | Status: DC | PRN
  Administered 2013-09-20: 10 mL
  Filled 2013-09-20: qty 10

## 2013-09-20 MED ORDER — HEPARIN SOD (PORK) LOCK FLUSH 100 UNIT/ML IV SOLN
500.0000 [IU] | Freq: Once | INTRAVENOUS | Status: AC | PRN
  Administered 2013-09-20: 500 [IU]
  Filled 2013-09-20: qty 5

## 2013-09-20 MED ORDER — PALONOSETRON HCL INJECTION 0.25 MG/5ML
INTRAVENOUS | Status: AC
  Filled 2013-09-20: qty 5

## 2013-09-20 NOTE — Telephone Encounter (Signed)
gv and printed appt sched and avs for pt for Sept...sed added tx....emailed MW to add tx for 9.10

## 2013-09-20 NOTE — Patient Instructions (Signed)
[facility] Discharge Instructions for Patients Receiving Chemotherapy  Today you received the following chemotherapy agents: Cisplatin and Irinotecan.  To help prevent nausea and vomiting after your treatment, we encourage you to take your nausea medication as prescribed.   If you develop nausea and vomiting that is not controlled by your nausea medication, call the clinic.   BELOW ARE SYMPTOMS THAT SHOULD BE REPORTED IMMEDIATELY:  *FEVER GREATER THAN 100.5 F  *CHILLS WITH OR WITHOUT FEVER  NAUSEA AND VOMITING THAT IS NOT CONTROLLED WITH YOUR NAUSEA MEDICATION  *UNUSUAL SHORTNESS OF BREATH  *UNUSUAL BRUISING OR BLEEDING  TENDERNESS IN MOUTH AND THROAT WITH OR WITHOUT PRESENCE OF ULCERS  *URINARY PROBLEMS  *BOWEL PROBLEMS  UNUSUAL RASH Items with * indicate a potential emergency and should be followed up as soon as possible.  Feel free to call the clinic you have any questions or concerns. The clinic phone number is [phone].

## 2013-09-20 NOTE — Progress Notes (Signed)
[place] [phone]   [phone]  OFFICE PROGRESS NOTE   DIAGNOSIS AND STAGE: Extensive stage small cell lung cancer with large obstructing Center left lung mass with large left pleural effusion, liver and brain metastasis diagnosed in June of 2014   PRIOR THERAPY:  1) Status post whole brain irradiation under the care of Dr. Isidore Moos completed 09/11/2012.  2) whole brain irradiation under the care of Dr. Isidore Moos expected to be completed on 09/11/2012.  3) Systemic chemotherapy with carboplatin for AUC of 5 on day 1 and etoposide 120 mg/M2 on days 1, 2 and 3 with Neulasta support on day 4, status post 4 cycles, last cycle was given on 10/11/2012 with partial response.   CURRENT THERAPY:  Systemic chemotherapy with cisplatin 30 mg/M2 and irinotecan 65 mg/M2 on days 1 and 8 every 3 weeks, status post 7 cycles. First dose 01/16/2013.  CHEMOTHERAPY INTENT: Palliative  CURRENT # OF CHEMOTHERAPY CYCLES: 0 CURRENT ANTIEMETICS: Zofran, dexamethasone and Compazine  CURRENT SMOKING STATUS: Former smoker  ORAL CHEMOTHERAPY AND CONSENT: None  CURRENT BISPHOSPHONATES USE: None  PAIN MANAGEMENT: 0/10 on Vicodin  NARCOTICS INDUCED CONSTIPATION: None  LIVING WILL AND CODE STATUS: no CODE BLUE   INTERVAL HISTORY: Cheryl Nielsen 73 y.o. female returns to the clinic today for follow up visit accompanied by a friend. She resumed systemic chemotherapy with cisplatin and irinotecan. She tolerated the first cycle of this treatment well. She continues to have some dizzy spells and inbalance in her gait. She is scheduled for MRI of the brain next week. She denied having any significant chest pain, shortness of breath, cough or hemoptysis. She has no weight loss or night sweats. She has no nausea or vomiting. She has no fever or chills.   MEDICAL HISTORY: Past Medical History  Diagnosis Date  . Pneumonia, organism unspecified   . Status post chemotherapy  10/11/2012       carboplatin  . S/P radiation therapy 08/24/2012-09/11/2012    Whole Brain  / 32.5 Gy in 13 fractions  . Lung cancer 07/05/12    Left Mainstem Bronchus- Small Cell Carcinoma    ALLERGIES:  is allergic to asa; codeine; and tramadol.  MEDICATIONS:  Current Outpatient Prescriptions  Medication Sig Dispense Refill  . acetaminophen (TYLENOL) 500 MG tablet Take 1,000 mg by mouth every 6 (six) hours as needed (For headache.).      Marland Kitchen diphenoxylate-atropine (LOMOTIL) 2.5-0.025 MG per tablet Take 1 tablet by mouth 2 (two) times daily as needed for diarrhea or loose stools.  60 tablet  1  . glucosamine-chondroitin 500-400 MG tablet Take 1 tablet by mouth daily with lunch.       . lidocaine-prilocaine (EMLA) cream Apply 1 application topically daily as needed (Applies to port-a-cath.).  30 g  0  . Multiple Vitamin (MULTIVITAMIN WITH MINERALS) TABS Take 1 tablet by mouth daily with lunch. She takes Dance movement psychotherapist Adult 50+.      . Omega-3 Fatty Acids (FISH OIL CONCENTRATE PO) Take by mouth. Pt unsure of dose, takes daily      . ondansetron (ZOFRAN) 8 MG tablet Take 1 tablet (8 mg total) by mouth every 8 (eight) hours as needed for nausea or vomiting.  30 tablet  0   No current facility-administered medications for this visit.    SURGICAL HISTORY:  Past Surgical History  Procedure Laterality Date  . Nasal sinus surgery    . Video bronchoscopy Bilateral 07/05/2012  Procedure: VIDEO BRONCHOSCOPY WITHOUT FLUORO;  Surgeon: Tanda Rockers, MD;  Location: [facility];  Service: Cardiopulmonary;  Laterality: Bilateral;  . Portacath placement      REVIEW OF SYSTEMS:  Constitutional: positive for fatigue Eyes: negative Ears, nose, mouth, throat, and face: negative Respiratory: positive for dyspnea on exertion Cardiovascular: negative Gastrointestinal: negative Genitourinary:negative Integument/breast: negative Hematologic/lymphatic: negative Musculoskeletal:negative Neurological:  negative Behavioral/Psych: negative Endocrine: negative Allergic/Immunologic: negative   PHYSICAL EXAMINATION: General appearance: alert, cooperative, fatigued and no distress Head: Normocephalic, without obvious abnormality, atraumatic Neck: no adenopathy, no JVD, supple, symmetrical, trachea midline and thyroid not enlarged, symmetric, no tenderness/mass/nodules Lymph nodes: Cervical, supraclavicular, and axillary nodes normal. Resp: clear to auscultation bilaterally Back: symmetric, no curvature. ROM normal. No CVA tenderness. Cardio: regular rate and rhythm, S1, S2 normal, no murmur, click, rub or gallop GI: soft, non-tender; bowel sounds normal; no masses,  no organomegaly Extremities: extremities normal, atraumatic, no cyanosis or edema Neurologic: Alert and oriented X 3, normal strength and tone. Normal symmetric reflexes. Normal coordination and gait  ECOG PERFORMANCE STATUS: 1 - Symptomatic but completely ambulatory  Blood pressure 142/72, pulse 74, temperature 97.9 F (36.6 C), temperature source Oral, resp. rate 19, height 5\' 2"  (1.575 m), weight 114 lb 6.4 oz (51.891 kg).  LABORATORY DATA: Lab Results  Component Value Date   WBC 3.1* 09/20/2013   HGB 11.4* 09/20/2013   HCT 34.5* 09/20/2013   MCV 89.3 09/20/2013   PLT 207 09/20/2013      Chemistry      Component Value Date/Time   NA 135* 09/20/2013 0908   NA 132* 11/17/2012 1228   K 4.4 09/20/2013 0908   K 4.6 11/17/2012 1228   CL 96 11/17/2012 1228   CL 93* 07/10/2012 1544   CO2 27 09/20/2013 0908   CO2 26 11/17/2012 1228   BUN 16.6 09/20/2013 0908   BUN 16 11/17/2012 1228   CREATININE 1.2* 09/20/2013 0908   CREATININE 1.05 11/17/2012 1228      Component Value Date/Time   CALCIUM 9.2 09/20/2013 0908   CALCIUM 9.9 11/17/2012 1228   ALKPHOS 92 09/20/2013 0908   AST 18 09/20/2013 0908   ALT 15 09/20/2013 0908   BILITOT 0.25 09/20/2013 0908       RADIOGRAPHIC STUDIES:   ASSESSMENT AND PLAN: this is a very pleasant 73 years old  white female with extensive stage small cell lung cancer status post 4 cycles of systemic chemotherapy with carboplatin and etoposide with significant improvement in her disease. She was on observation for few months and the patient had evidence for disease progression on restaging scan. She was started on second line chemotherapy with cisplatin and irinotecan currently status post 7 cycles. She tolerated the last cycle of her treatment well with no significant adverse effects. I recommended for the patient to proceed with cycle #8 today as scheduled. She would come back for followup visit in 3 weeks with the start of cycle #9. She was advised to call immediately if she has any concerning symptoms in the interval. The patient voices understanding of current disease status and treatment options and is in agreement with the current care plan.  Disclaimer: This note was dictated with voice recognition software. Similar sounding words can inadvertently be transcribed and may not be corrected upon review.

## 2013-09-20 NOTE — Telephone Encounter (Signed)
lvm for pt regarding to Sept MRI 9.10 and 9.11 appt.Marland KitchenMarland Kitchen

## 2013-09-21 ENCOUNTER — Telehealth: Payer: Self-pay | Admitting: *Deleted

## 2013-09-21 NOTE — Telephone Encounter (Signed)
Per staff message and POF I have scheduled appts. Advised scheduler of appts. Advised scheduler no available on 9/10 moved to 9/11   JMW

## 2013-09-27 ENCOUNTER — Other Ambulatory Visit: Payer: Medicare Other

## 2013-09-28 ENCOUNTER — Other Ambulatory Visit (HOSPITAL_BASED_OUTPATIENT_CLINIC_OR_DEPARTMENT_OTHER): Payer: Medicare Other

## 2013-09-28 ENCOUNTER — Other Ambulatory Visit: Payer: Medicare Other

## 2013-09-28 ENCOUNTER — Ambulatory Visit (HOSPITAL_BASED_OUTPATIENT_CLINIC_OR_DEPARTMENT_OTHER): Payer: Medicare Other

## 2013-09-28 VITALS — BP 144/82 | HR 86 | Temp 97.8°F | Resp 18

## 2013-09-28 DIAGNOSIS — C3492 Malignant neoplasm of unspecified part of left bronchus or lung: Secondary | ICD-10-CM

## 2013-09-28 DIAGNOSIS — C7949 Secondary malignant neoplasm of other parts of nervous system: Secondary | ICD-10-CM

## 2013-09-28 DIAGNOSIS — C3490 Malignant neoplasm of unspecified part of unspecified bronchus or lung: Secondary | ICD-10-CM

## 2013-09-28 DIAGNOSIS — C787 Secondary malignant neoplasm of liver and intrahepatic bile duct: Secondary | ICD-10-CM

## 2013-09-28 DIAGNOSIS — C7931 Secondary malignant neoplasm of brain: Secondary | ICD-10-CM

## 2013-09-28 DIAGNOSIS — Z5111 Encounter for antineoplastic chemotherapy: Secondary | ICD-10-CM

## 2013-09-28 LAB — COMPREHENSIVE METABOLIC PANEL (CC13)
ALK PHOS: 91 U/L (ref 40–150)
ALT: 10 U/L (ref 0–55)
AST: 16 U/L (ref 5–34)
Albumin: 3.6 g/dL (ref 3.5–5.0)
Anion Gap: 9 mEq/L (ref 3–11)
BILIRUBIN TOTAL: 0.22 mg/dL (ref 0.20–1.20)
BUN: 17.7 mg/dL (ref 7.0–26.0)
CO2: 27 mEq/L (ref 22–29)
CREATININE: 1.2 mg/dL — AB (ref 0.6–1.1)
Calcium: 9.3 mg/dL (ref 8.4–10.4)
Chloride: 100 mEq/L (ref 98–109)
Glucose: 75 mg/dl (ref 70–140)
Potassium: 4.3 mEq/L (ref 3.5–5.1)
SODIUM: 135 meq/L — AB (ref 136–145)
Total Protein: 6.5 g/dL (ref 6.4–8.3)

## 2013-09-28 LAB — CBC WITH DIFFERENTIAL/PLATELET
BASO%: 0.9 % (ref 0.0–2.0)
BASOS ABS: 0 10*3/uL (ref 0.0–0.1)
EOS ABS: 0 10*3/uL (ref 0.0–0.5)
EOS%: 0.9 % (ref 0.0–7.0)
HCT: 35.6 % (ref 34.8–46.6)
HEMOGLOBIN: 11.7 g/dL (ref 11.6–15.9)
LYMPH#: 0.8 10*3/uL — AB (ref 0.9–3.3)
LYMPH%: 28.4 % (ref 14.0–49.7)
MCH: 29.2 pg (ref 25.1–34.0)
MCHC: 32.9 g/dL (ref 31.5–36.0)
MCV: 88.8 fL (ref 79.5–101.0)
MONO#: 0.5 10*3/uL (ref 0.1–0.9)
MONO%: 20.6 % — ABNORMAL HIGH (ref 0.0–14.0)
NEUT%: 49.2 % (ref 38.4–76.8)
NEUTROS ABS: 1.3 10*3/uL — AB (ref 1.5–6.5)
Platelets: 186 10*3/uL (ref 145–400)
RBC: 4.01 10*6/uL (ref 3.70–5.45)
RDW: 13.6 % (ref 11.2–14.5)
WBC: 2.6 10*3/uL — ABNORMAL LOW (ref 3.9–10.3)

## 2013-09-28 LAB — MAGNESIUM (CC13): MAGNESIUM: 2.2 mg/dL (ref 1.5–2.5)

## 2013-09-28 MED ORDER — POTASSIUM CHLORIDE 2 MEQ/ML IV SOLN
Freq: Once | INTRAVENOUS | Status: AC
  Administered 2013-09-28: 11:00:00 via INTRAVENOUS
  Filled 2013-09-28: qty 10

## 2013-09-28 MED ORDER — PALONOSETRON HCL INJECTION 0.25 MG/5ML
INTRAVENOUS | Status: AC
  Filled 2013-09-28: qty 5

## 2013-09-28 MED ORDER — SODIUM CHLORIDE 0.9 % IV SOLN
150.0000 mg | Freq: Once | INTRAVENOUS | Status: AC
  Administered 2013-09-28: 150 mg via INTRAVENOUS
  Filled 2013-09-28: qty 5

## 2013-09-28 MED ORDER — ATROPINE SULFATE 1 MG/ML IJ SOLN
INTRAMUSCULAR | Status: AC
Start: 2013-09-28 — End: 2013-09-28
  Filled 2013-09-28: qty 1

## 2013-09-28 MED ORDER — DEXAMETHASONE SODIUM PHOSPHATE 20 MG/5ML IJ SOLN
INTRAMUSCULAR | Status: AC
  Filled 2013-09-28: qty 5

## 2013-09-28 MED ORDER — CISPLATIN CHEMO INJECTION 100MG/100ML
25.0000 mg/m2 | Freq: Once | INTRAVENOUS | Status: AC
  Administered 2013-09-28: 36 mg via INTRAVENOUS
  Filled 2013-09-28: qty 36

## 2013-09-28 MED ORDER — HEPARIN SOD (PORK) LOCK FLUSH 100 UNIT/ML IV SOLN
500.0000 [IU] | Freq: Once | INTRAVENOUS | Status: AC | PRN
  Administered 2013-09-28: 500 [IU]
  Filled 2013-09-28: qty 5

## 2013-09-28 MED ORDER — PALONOSETRON HCL INJECTION 0.25 MG/5ML
0.2500 mg | Freq: Once | INTRAVENOUS | Status: AC
  Administered 2013-09-28: 0.25 mg via INTRAVENOUS

## 2013-09-28 MED ORDER — SODIUM CHLORIDE 0.9 % IJ SOLN
10.0000 mL | INTRAMUSCULAR | Status: DC | PRN
  Administered 2013-09-28: 10 mL
  Filled 2013-09-28: qty 10

## 2013-09-28 MED ORDER — SODIUM CHLORIDE 0.9 % IV SOLN
Freq: Once | INTRAVENOUS | Status: AC
  Administered 2013-09-28: 11:00:00 via INTRAVENOUS

## 2013-09-28 MED ORDER — IRINOTECAN HCL CHEMO INJECTION 100 MG/5ML
65.0000 mg/m2 | Freq: Once | INTRAVENOUS | Status: AC
  Administered 2013-09-28: 94 mg via INTRAVENOUS
  Filled 2013-09-28: qty 4.7

## 2013-09-28 MED ORDER — DEXAMETHASONE SODIUM PHOSPHATE 20 MG/5ML IJ SOLN
12.0000 mg | Freq: Once | INTRAMUSCULAR | Status: AC
  Administered 2013-09-28: 12 mg via INTRAVENOUS

## 2013-09-28 MED ORDER — ATROPINE SULFATE 1 MG/ML IJ SOLN
0.5000 mg | Freq: Once | INTRAMUSCULAR | Status: AC | PRN
  Administered 2013-09-28: 0.5 mg via INTRAVENOUS

## 2013-09-28 NOTE — Progress Notes (Signed)
Okay to treat with ANC 1.3 per Dr. Julien Nordmann.

## 2013-09-28 NOTE — Patient Instructions (Signed)
[facility] Discharge Instructions for Patients Receiving Chemotherapy  Today you received the following chemotherapy agents: Irinotecan and Cisplatin  To help prevent nausea and vomiting after your treatment, we encourage you to take your nausea medication as prescribed.   If you develop nausea and vomiting that is not controlled by your nausea medication, call the clinic.   BELOW ARE SYMPTOMS THAT SHOULD BE REPORTED IMMEDIATELY:  *FEVER GREATER THAN 100.5 F  *CHILLS WITH OR WITHOUT FEVER  NAUSEA AND VOMITING THAT IS NOT CONTROLLED WITH YOUR NAUSEA MEDICATION  *UNUSUAL SHORTNESS OF BREATH  *UNUSUAL BRUISING OR BLEEDING  TENDERNESS IN MOUTH AND THROAT WITH OR WITHOUT PRESENCE OF ULCERS  *URINARY PROBLEMS  *BOWEL PROBLEMS  UNUSUAL RASH Items with * indicate a potential emergency and should be followed up as soon as possible.  Feel free to call the clinic you have any questions or concerns. The clinic phone number is [phone].

## 2013-10-01 ENCOUNTER — Ambulatory Visit: Payer: Medicare Other | Admitting: Radiation Oncology

## 2013-10-02 ENCOUNTER — Other Ambulatory Visit: Payer: Medicare Other

## 2013-10-03 ENCOUNTER — Ambulatory Visit: Payer: Medicare Other | Admitting: Radiation Oncology

## 2013-10-04 ENCOUNTER — Other Ambulatory Visit (HOSPITAL_BASED_OUTPATIENT_CLINIC_OR_DEPARTMENT_OTHER): Payer: Medicare Other

## 2013-10-04 DIAGNOSIS — C3492 Malignant neoplasm of unspecified part of left bronchus or lung: Secondary | ICD-10-CM

## 2013-10-04 DIAGNOSIS — C7949 Secondary malignant neoplasm of other parts of nervous system: Secondary | ICD-10-CM

## 2013-10-04 DIAGNOSIS — C7931 Secondary malignant neoplasm of brain: Secondary | ICD-10-CM

## 2013-10-04 DIAGNOSIS — C787 Secondary malignant neoplasm of liver and intrahepatic bile duct: Secondary | ICD-10-CM

## 2013-10-04 DIAGNOSIS — C3490 Malignant neoplasm of unspecified part of unspecified bronchus or lung: Secondary | ICD-10-CM

## 2013-10-04 LAB — CBC WITH DIFFERENTIAL/PLATELET
BASO%: 0.6 % (ref 0.0–2.0)
BASOS ABS: 0 10*3/uL (ref 0.0–0.1)
EOS ABS: 0 10*3/uL (ref 0.0–0.5)
EOS%: 0.4 % (ref 0.0–7.0)
HEMATOCRIT: 34.8 % (ref 34.8–46.6)
HGB: 11.4 g/dL — ABNORMAL LOW (ref 11.6–15.9)
LYMPH%: 22.1 % (ref 14.0–49.7)
MCH: 29.1 pg (ref 25.1–34.0)
MCHC: 32.8 g/dL (ref 31.5–36.0)
MCV: 88.6 fL (ref 79.5–101.0)
MONO#: 0.4 10*3/uL (ref 0.1–0.9)
MONO%: 11.3 % (ref 0.0–14.0)
NEUT%: 65.6 % (ref 38.4–76.8)
NEUTROS ABS: 2.2 10*3/uL (ref 1.5–6.5)
Platelets: 205 10*3/uL (ref 145–400)
RBC: 3.93 10*6/uL (ref 3.70–5.45)
RDW: 13.5 % (ref 11.2–14.5)
WBC: 3.4 10*3/uL — ABNORMAL LOW (ref 3.9–10.3)
lymph#: 0.7 10*3/uL — ABNORMAL LOW (ref 0.9–3.3)

## 2013-10-04 LAB — COMPREHENSIVE METABOLIC PANEL
ALBUMIN: 3.9 g/dL (ref 3.5–5.2)
ALT: 17 U/L (ref 0–35)
AST: 19 U/L (ref 0–37)
Alkaline Phosphatase: 84 U/L (ref 39–117)
BUN: 20 mg/dL (ref 6–23)
CALCIUM: 8.9 mg/dL (ref 8.4–10.5)
CHLORIDE: 97 meq/L (ref 96–112)
CO2: 26 mEq/L (ref 19–32)
Creatinine, Ser: 1.04 mg/dL (ref 0.50–1.10)
Glucose, Bld: 86 mg/dL (ref 70–99)
POTASSIUM: 4.7 meq/L (ref 3.5–5.3)
Sodium: 131 mEq/L — ABNORMAL LOW (ref 135–145)
TOTAL PROTEIN: 6.3 g/dL (ref 6.0–8.3)
Total Bilirubin: 0.2 mg/dL (ref 0.2–1.2)

## 2013-10-04 LAB — MAGNESIUM: MAGNESIUM: 1.7 mg/dL (ref 1.5–2.5)

## 2013-10-08 ENCOUNTER — Ambulatory Visit
Admission: RE | Admit: 2013-10-08 | Discharge: 2013-10-08 | Disposition: A | Discharge status: Home / Self Care | Payer: Medicare Other | Source: Ambulatory Visit | Attending: Radiation Oncology | Admitting: Radiation Oncology

## 2013-10-08 DIAGNOSIS — C7949 Secondary malignant neoplasm of other parts of nervous system: Principal | ICD-10-CM

## 2013-10-08 DIAGNOSIS — C7931 Secondary malignant neoplasm of brain: Secondary | ICD-10-CM

## 2013-10-08 MED ORDER — GADOBENATE DIMEGLUMINE 529 MG/ML IV SOLN
10.0000 mL | Freq: Once | INTRAVENOUS | Status: AC | PRN
  Administered 2013-10-08: 10 mL via INTRAVENOUS

## 2013-10-09 ENCOUNTER — Encounter: Payer: Self-pay | Admitting: Radiation Oncology

## 2013-10-09 NOTE — Progress Notes (Signed)
Radiation Oncology         [phone] ________________________________  Name: NADRA HRITZ MRN: [id number]  Date: 10/10/2013  DOB: 11-27-1940  Follow-Up Visit Note  CC: No PCP Per Patient  No ref. 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  She denies any headaches, blurred vision nor nausea and vomiting, but still has unsteady gait and sometimes needs minimal assistance while ambulating.   Denies any SOB She is  on second line chemotherapy with cisplatin and irinotecan.                       ALLERGIES:  is allergic to asa; codeine; and tramadol.  Meds: Current Outpatient Prescriptions  Medication Sig Dispense Refill  . acetaminophen (TYLENOL) 500 MG tablet Take 1,000 mg by mouth every 6 (six) hours as needed (For headache.).      Marland Kitchen diphenoxylate-atropine (LOMOTIL) 2.5-0.025 MG per tablet Take 1 tablet by mouth 2 (two) times daily as needed for diarrhea or loose stools.  60 tablet  1  . glucosamine-chondroitin 500-400 MG tablet Take 1 tablet by mouth daily with lunch.       . lidocaine-prilocaine (EMLA) cream Apply 1 application topically daily as needed (Applies to port-a-cath.).  30 g  0  . Multiple Vitamin (MULTIVITAMIN WITH MINERALS) TABS Take 1 tablet by mouth daily with lunch. She takes Dance movement psychotherapist Adult 50+.      . Omega-3 Fatty Acids (FISH OIL CONCENTRATE PO) Take by mouth. Pt unsure of dose, takes daily      . ondansetron (ZOFRAN) 8 MG tablet Take 1 tablet (8 mg total) by mouth every 8 (eight) hours as needed for nausea or vomiting.  30 tablet  0   No current facility-administered medications for this encounter.    Physical  Findings: The patient is in no acute distress. Patient is alert and oriented.  height is 5\' 2"  (1.575 m) and weight is 116 lb 4.8 oz (52.753 kg). Her temperature is 97.7 F (36.5 C). Her blood pressure is 125/78 and her pulse is 96. .  General: Alert and oriented, in no acute distress HEENT: Head is normocephalic.  Extraocular movements are intact. Oropharynx is clear. Extremities: No cyanosis or edema. Musculoskeletal: symmetric strength and muscle tone throughout. Ambulatory. Walks straight, slowly. Neurologic: Cranial nerves II through XII are grossly intact. No obvious focalities. Speech is fluent. Coordination is intact. Psychiatric: Judgment and insight are intact. Affect is appropriate.    Lab Findings: Lab Results  Component Value Date   WBC 3.4* 10/04/2013   HGB 11.4* 10/04/2013   HCT 34.8 10/04/2013   MCV 88.6 10/04/2013   PLT 205 10/04/2013    No results found for this basename: TSH    Radiographic Findings: Mr Kizzie Fantasia Contrast  10/08/2013   CLINICAL DATA:  73 year old female with metastatic lung cancer status post stereotactic radiosurgery. Restaging. Subsequent encounter.  EXAM: MRI HEAD WITHOUT AND WITH CONTRAST  TECHNIQUE: Multiplanar, multiecho pulse sequences of the brain and surrounding structures were obtained without and with intravenous contrast.  CONTRAST:  76mL MULTIHANCE GADOBENATE DIMEGLUMINE 529 MG/ML IV SOLN  COMPARISON:  05/18/2013 and earlier.  FINDINGS: Faint focus  of enhancement in the right superior frontal gyrus on series 10, image 119, corresponding to the treated brain metastasis at that site which was last visible on 02/23/2013.  Probable small venous structure seen on series 10, image 100 just above the right lateral ventricle. No prior metastasis in this region.  There is a new small focus of chronic hemorrhage in the right superior parietal lobe (on series 5, image 23) with no associated enhancement or mass effect.  No new or active brain metastasis  identified.  Vividly enhancing the bone metastases near the torcula (series 10, image 53) and at the anterior left middle cranial fossa (image 39) have not significantly changed.  Cannot exclude a small focus of restricted diffusion in the right posterior temporal lobe on series 4, image 14. No associated enhancement. There may be faint associated T2 hyperintensity (series 6, image 13). Major intracranial vascular flow voids are stable. No other restricted diffusion. Negative pituitary, cervicomedullary junction and visualized cervical spine. No acute intracranial hemorrhage identified.  Visible internal auditory structures appear normal. Mastoids are clear. Stable minor paranasal sinus mucosal thickening. Visualized orbit soft tissues are within normal limits. Visualized scalp soft tissues are within normal limits.  IMPRESSION: 1. No definite new or active metastatic disease to the brain; - Faint visualization of the treated metastasis in the right superior frontal gyrus (series 10, image 119). - Probable normal venous structure on series 10, image 100. - Possible tiny lacunar infarct posterior right temporal lobe with no enhancement (series 4, image 14). - Small focus of hemorrhage in the right superior parietal lobe is new from prior studies, no associated enhancement.  2. No significant change in vividly enhancing bone metastases near the torcula, and at the left middle cranial fossa.   Electronically Signed   By: Lars Pinks M.D.   On: 10/08/2013 13:15    Impression/Plan:  MRI brain negative for progressive or new metastases.  We discussed the tiny infarct and hemorrhage that I doubt are symptomatic.  I offered referral to neurology for assessment and risk management to prevent strokes. She declines this.  But, she is enthusiastic about a PT/OT home health referral. I will order this.  Also, TSH will be ordered in light of prior brain RT 1 year ago.  F/u with repeat MRI in 4  mo.  _____________________________________   Eppie Gibson, MD

## 2013-10-10 ENCOUNTER — Ambulatory Visit
Admission: RE | Admit: 2013-10-10 | Discharge: 2013-10-10 | Disposition: A | Discharge status: Home / Self Care | Payer: Medicare Other | Source: Ambulatory Visit | Attending: Radiation Oncology | Admitting: Radiation Oncology

## 2013-10-10 ENCOUNTER — Encounter: Payer: Self-pay | Admitting: Radiation Oncology

## 2013-10-10 VITALS — BP 125/78 | HR 96 | Temp 97.7°F | Ht 62.0 in | Wt 116.3 lb

## 2013-10-10 DIAGNOSIS — C7931 Secondary malignant neoplasm of brain: Secondary | ICD-10-CM

## 2013-10-10 DIAGNOSIS — R5381 Other malaise: Secondary | ICD-10-CM

## 2013-10-10 DIAGNOSIS — C7949 Secondary malignant neoplasm of other parts of nervous system: Principal | ICD-10-CM

## 2013-10-10 DIAGNOSIS — R5383 Other fatigue: Secondary | ICD-10-CM

## 2013-10-10 NOTE — Progress Notes (Addendum)
Cheryl Nielsen is here for reassessment sp radiation therapy to her brain which completed in August 2014.  She denies any headaches, blurred vision nor nausea and vomiting, but note unsteady gait and needs minimal assistance while ambulating. Also had prior treatment to her Left hilum in 2014.  Denies any SOB.

## 2013-10-11 ENCOUNTER — Ambulatory Visit (HOSPITAL_BASED_OUTPATIENT_CLINIC_OR_DEPARTMENT_OTHER): Payer: Medicare Other | Admitting: Physician Assistant

## 2013-10-11 ENCOUNTER — Ambulatory Visit (HOSPITAL_BASED_OUTPATIENT_CLINIC_OR_DEPARTMENT_OTHER): Payer: Medicare Other

## 2013-10-11 ENCOUNTER — Other Ambulatory Visit: Payer: Medicare Other

## 2013-10-11 ENCOUNTER — Telehealth: Payer: Self-pay | Admitting: Internal Medicine

## 2013-10-11 ENCOUNTER — Encounter: Payer: Self-pay | Admitting: Physician Assistant

## 2013-10-11 ENCOUNTER — Encounter: Payer: Self-pay | Admitting: Radiation Oncology

## 2013-10-11 ENCOUNTER — Other Ambulatory Visit (HOSPITAL_BASED_OUTPATIENT_CLINIC_OR_DEPARTMENT_OTHER): Payer: Medicare Other

## 2013-10-11 VITALS — BP 149/84 | HR 86 | Temp 97.6°F | Resp 18 | Ht 62.0 in | Wt 114.8 lb

## 2013-10-11 DIAGNOSIS — R350 Frequency of micturition: Secondary | ICD-10-CM

## 2013-10-11 DIAGNOSIS — C7949 Secondary malignant neoplasm of other parts of nervous system: Secondary | ICD-10-CM

## 2013-10-11 DIAGNOSIS — C3492 Malignant neoplasm of unspecified part of left bronchus or lung: Secondary | ICD-10-CM

## 2013-10-11 DIAGNOSIS — R269 Unspecified abnormalities of gait and mobility: Secondary | ICD-10-CM

## 2013-10-11 DIAGNOSIS — C7931 Secondary malignant neoplasm of brain: Secondary | ICD-10-CM

## 2013-10-11 DIAGNOSIS — R5383 Other fatigue: Secondary | ICD-10-CM

## 2013-10-11 DIAGNOSIS — C787 Secondary malignant neoplasm of liver and intrahepatic bile duct: Secondary | ICD-10-CM

## 2013-10-11 DIAGNOSIS — C3490 Malignant neoplasm of unspecified part of unspecified bronchus or lung: Secondary | ICD-10-CM

## 2013-10-11 DIAGNOSIS — Z5111 Encounter for antineoplastic chemotherapy: Secondary | ICD-10-CM

## 2013-10-11 DIAGNOSIS — R5381 Other malaise: Secondary | ICD-10-CM

## 2013-10-11 LAB — COMPREHENSIVE METABOLIC PANEL (CC13)
ALT: 10 U/L (ref 0–55)
ANION GAP: 9 meq/L (ref 3–11)
AST: 15 U/L (ref 5–34)
Albumin: 3.6 g/dL (ref 3.5–5.0)
Alkaline Phosphatase: 94 U/L (ref 40–150)
BUN: 24.3 mg/dL (ref 7.0–26.0)
CO2: 27 mEq/L (ref 22–29)
CREATININE: 1.1 mg/dL (ref 0.6–1.1)
Calcium: 9.7 mg/dL (ref 8.4–10.4)
Chloride: 102 mEq/L (ref 98–109)
Glucose: 69 mg/dl — ABNORMAL LOW (ref 70–140)
Potassium: 4.2 mEq/L (ref 3.5–5.1)
Sodium: 137 mEq/L (ref 136–145)
Total Bilirubin: 0.27 mg/dL (ref 0.20–1.20)
Total Protein: 6.7 g/dL (ref 6.4–8.3)

## 2013-10-11 LAB — CBC WITH DIFFERENTIAL/PLATELET
BASO%: 0.4 % (ref 0.0–2.0)
Basophils Absolute: 0 10*3/uL (ref 0.0–0.1)
EOS%: 1.6 % (ref 0.0–7.0)
Eosinophils Absolute: 0 10*3/uL (ref 0.0–0.5)
HCT: 34.2 % — ABNORMAL LOW (ref 34.8–46.6)
HEMOGLOBIN: 11.3 g/dL — AB (ref 11.6–15.9)
LYMPH%: 29.5 % (ref 14.0–49.7)
MCH: 29.2 pg (ref 25.1–34.0)
MCHC: 33 g/dL (ref 31.5–36.0)
MCV: 88.4 fL (ref 79.5–101.0)
MONO#: 0.4 10*3/uL (ref 0.1–0.9)
MONO%: 15.2 % — ABNORMAL HIGH (ref 0.0–14.0)
NEUT%: 53.3 % (ref 38.4–76.8)
NEUTROS ABS: 1.3 10*3/uL — AB (ref 1.5–6.5)
PLATELETS: 200 10*3/uL (ref 145–400)
RBC: 3.87 10*6/uL (ref 3.70–5.45)
RDW: 14.6 % — ABNORMAL HIGH (ref 11.2–14.5)
WBC: 2.4 10*3/uL — ABNORMAL LOW (ref 3.9–10.3)
lymph#: 0.7 10*3/uL — ABNORMAL LOW (ref 0.9–3.3)

## 2013-10-11 LAB — URINALYSIS, MICROSCOPIC - CHCC
BILIRUBIN (URINE): NEGATIVE
Blood: NEGATIVE
Glucose: NEGATIVE mg/dL
Ketones: NEGATIVE mg/dL
LEUKOCYTE ESTERASE: NEGATIVE
NITRITE: NEGATIVE
Protein: NEGATIVE mg/dL
RBC / HPF: NEGATIVE (ref 0–2)
Specific Gravity, Urine: 1.015 (ref 1.003–1.035)
Urobilinogen, UR: 0.2 mg/dL (ref 0.2–1)
WBC UA: NEGATIVE (ref 0–2)
pH: 6 (ref 4.6–8.0)

## 2013-10-11 LAB — TSH CHCC: TSH: 3.29 m(IU)/L (ref 0.308–3.960)

## 2013-10-11 LAB — T4, FREE: Free T4: 1.04 ng/dL (ref 0.80–1.80)

## 2013-10-11 LAB — MAGNESIUM (CC13): MAGNESIUM: 2.1 mg/dL (ref 1.5–2.5)

## 2013-10-11 MED ORDER — DEXAMETHASONE SODIUM PHOSPHATE 20 MG/5ML IJ SOLN
12.0000 mg | Freq: Once | INTRAMUSCULAR | Status: AC
  Administered 2013-10-11: 13:00:00 via INTRAVENOUS

## 2013-10-11 MED ORDER — IRINOTECAN HCL CHEMO INJECTION 100 MG/5ML
65.0000 mg/m2 | Freq: Once | INTRAVENOUS | Status: AC
  Administered 2013-10-11: 94 mg via INTRAVENOUS
  Filled 2013-10-11: qty 4.7

## 2013-10-11 MED ORDER — PALONOSETRON HCL INJECTION 0.25 MG/5ML
0.2500 mg | Freq: Once | INTRAVENOUS | Status: AC
  Administered 2013-10-11: 0.25 mg via INTRAVENOUS

## 2013-10-11 MED ORDER — HEPARIN SOD (PORK) LOCK FLUSH 100 UNIT/ML IV SOLN
500.0000 [IU] | Freq: Once | INTRAVENOUS | Status: AC | PRN
  Administered 2013-10-11: 500 [IU]
  Filled 2013-10-11: qty 5

## 2013-10-11 MED ORDER — PALONOSETRON HCL INJECTION 0.25 MG/5ML
INTRAVENOUS | Status: AC
  Filled 2013-10-11: qty 5

## 2013-10-11 MED ORDER — POTASSIUM CHLORIDE 2 MEQ/ML IV SOLN
Freq: Once | INTRAVENOUS | Status: AC
  Administered 2013-10-11: 11:00:00 via INTRAVENOUS
  Filled 2013-10-11: qty 10

## 2013-10-11 MED ORDER — SODIUM CHLORIDE 0.9 % IJ SOLN
10.0000 mL | INTRAMUSCULAR | Status: DC | PRN
  Administered 2013-10-11: 10 mL
  Filled 2013-10-11: qty 10

## 2013-10-11 MED ORDER — SODIUM CHLORIDE 0.9 % IV SOLN
150.0000 mg | Freq: Once | INTRAVENOUS | Status: AC
  Administered 2013-10-11: 150 mg via INTRAVENOUS
  Filled 2013-10-11: qty 5

## 2013-10-11 MED ORDER — SODIUM CHLORIDE 0.9 % IV SOLN
Freq: Once | INTRAVENOUS | Status: AC
  Administered 2013-10-11: 13:00:00 via INTRAVENOUS

## 2013-10-11 MED ORDER — SODIUM CHLORIDE 0.9 % IV SOLN
25.0000 mg/m2 | Freq: Once | INTRAVENOUS | Status: AC
  Administered 2013-10-11: 36 mg via INTRAVENOUS
  Filled 2013-10-11: qty 36

## 2013-10-11 MED ORDER — DEXAMETHASONE SODIUM PHOSPHATE 20 MG/5ML IJ SOLN
INTRAMUSCULAR | Status: AC
  Filled 2013-10-11: qty 5

## 2013-10-11 NOTE — Telephone Encounter (Signed)
gv and printed appt sched and avs for pt for Sept and OCT....gv pt barium....sent pt to lab

## 2013-10-11 NOTE — Patient Instructions (Signed)
[facility] Discharge Instructions for Patients Receiving Chemotherapy  Today you received the following chemotherapy agents Camptosar and cisplatin  To help prevent nausea and vomiting after your treatment, we encourage you to take your nausea medication as directed.   If you develop nausea and vomiting that is not controlled by your nausea medication, call the clinic.   BELOW ARE SYMPTOMS THAT SHOULD BE REPORTED IMMEDIATELY:  *FEVER GREATER THAN 100.5 F  *CHILLS WITH OR WITHOUT FEVER  NAUSEA AND VOMITING THAT IS NOT CONTROLLED WITH YOUR NAUSEA MEDICATION  *UNUSUAL SHORTNESS OF BREATH  *UNUSUAL BRUISING OR BLEEDING  TENDERNESS IN MOUTH AND THROAT WITH OR WITHOUT PRESENCE OF ULCERS  *URINARY PROBLEMS  *BOWEL PROBLEMS  UNUSUAL RASH Items with * indicate a potential emergency and should be followed up as soon as possible.  Feel free to call the clinic you have any questions or concerns. The clinic phone number is [phone].

## 2013-10-11 NOTE — Progress Notes (Signed)
[place] [phone]   [phone]  OFFICE PROGRESS NOTE   DIAGNOSIS AND STAGE: Extensive stage small cell lung cancer with large obstructing Center left lung mass with large left pleural effusion, liver and brain metastasis diagnosed in June of 2014   PRIOR THERAPY:  1) Status post whole brain irradiation under the care of Dr. Isidore Moos completed 09/11/2012.  2) whole brain irradiation under the care of Dr. Isidore Moos expected to be completed on 09/11/2012.  3) Systemic chemotherapy with carboplatin for AUC of 5 on day 1 and etoposide 120 mg/M2 on days 1, 2 and 3 with Neulasta support on day 4, status post 4 cycles, last cycle was given on 10/11/2012 with partial response.   CURRENT THERAPY:  Systemic chemotherapy with cisplatin 30 mg/M2 and irinotecan 65 mg/M2 on days 1 and 8 every 3 weeks, status post 8 cycles. First dose 01/16/2013. Restarted 08/30/2013  CHEMOTHERAPY INTENT: Palliative  CURRENT # OF CHEMOTHERAPY CYCLES: 9 CURRENT ANTIEMETICS: Zofran, dexamethasone and Compazine  CURRENT SMOKING STATUS: Former smoker  ORAL CHEMOTHERAPY AND CONSENT: None  CURRENT BISPHOSPHONATES USE: None  PAIN MANAGEMENT: 0/10 on Vicodin  NARCOTICS INDUCED CONSTIPATION: None  LIVING WILL AND CODE STATUS: no CODE BLUE   INTERVAL HISTORY: NDIA SAMPATH 73 y.o. female returns to the clinic today for follow up visit accompanied by a friend. She resumed systemic chemotherapy with cisplatin and irinotecan. She tolerated the first two cycles of this treatment well. She continues to have some dizzy spells and inbalance in her gait.  She denied having any significant chest pain, shortness of breath, cough or hemoptysis. She has no weight loss or night sweats. She has no nausea or vomiting. She has no fever or chills.   MEDICAL HISTORY: Past Medical History  Diagnosis Date  . Pneumonia, organism unspecified   . Status post chemotherapy  10/11/2012     carboplatin  . S/P  radiation therapy 08/24/2012-09/11/2012    Whole Brain  / 32.5 Gy in 13 fractions  . 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 S/P radiation therapy  07/24/2012-08/10/2012      Mediastinum and Left Hilum / 35 Gy in 14 fractions      ALLERGIES:  is allergic to asa; codeine; and tramadol.  MEDICATIONS:  Current Outpatient Prescriptions  Medication Sig Dispense Refill  . diphenoxylate-atropine (LOMOTIL) 2.5-0.025 MG per tablet Take 1 tablet by mouth 2 (two) times daily as needed for diarrhea or loose stools.  60 tablet  1  . glucosamine-chondroitin 500-400 MG tablet Take 1 tablet by mouth daily with lunch.       . lidocaine-prilocaine (EMLA) cream Apply 1 application topically daily as needed (Applies to port-a-cath.).  30 g  0  . Multiple Vitamin (MULTIVITAMIN WITH MINERALS) TABS Take 1 tablet by mouth daily with lunch. She takes Dance movement psychotherapist Adult 50+.      . Omega-3 Fatty Acids (FISH OIL CONCENTRATE PO) Take by mouth. Pt unsure of dose, takes daily      . acetaminophen (TYLENOL) 500 MG tablet Take 1,000 mg by mouth every 6 (six) hours as needed (For headache.).      Marland Kitchen ondansetron (ZOFRAN) 8 MG tablet Take 1 tablet (8 mg total) by mouth every 8 (eight) hours as needed for nausea or vomiting.  30 tablet  0   No current facility-administered medications for this visit.   Facility-Administered Medications Ordered in Other Visits  Medication Dose Route  Frequency Provider Last Rate Last Dose  . CISplatin (PLATINOL) 36 mg in sodium chloride 0.9 % 250 mL chemo infusion  25 mg/m2 (Treatment Plan Actual) Intravenous Once [employer], PA-C   36 mg at 10/11/13 1601  . heparin lock flush 100 unit/mL  500 Units Intracatheter Once PRN Danylle Ouk E Demetric Dunnaway, PA-C      . sodium chloride 0.9 % injection 10 mL  10 mL Intracatheter PRN Carlton Adam, PA-C        SURGICAL HISTORY:  Past Surgical History  Procedure Laterality Date  . Nasal sinus surgery    . Video bronchoscopy  Bilateral 07/05/2012    Procedure: VIDEO BRONCHOSCOPY WITHOUT FLUORO;  Surgeon: Tanda Rockers, MD;  Location: [facility];  Service: Cardiopulmonary;  Laterality: Bilateral;  . Portacath placement      REVIEW OF SYSTEMS:  Constitutional: positive for fatigue Eyes: negative Ears, nose, mouth, throat, and face: negative Respiratory: positive for dyspnea on exertion Cardiovascular: negative Gastrointestinal: negative Genitourinary:negative Integument/breast: negative Hematologic/lymphatic: negative Musculoskeletal:negative Neurological: negative Behavioral/Psych: negative Endocrine: negative Allergic/Immunologic: negative   PHYSICAL EXAMINATION: General appearance: alert, cooperative, fatigued and no distress Head: Normocephalic, without obvious abnormality, atraumatic Neck: no adenopathy, no JVD, supple, symmetrical, trachea midline and thyroid not enlarged, symmetric, no tenderness/mass/nodules Lymph nodes: Cervical, supraclavicular, and axillary nodes normal. Resp: clear to auscultation bilaterally Back: symmetric, no curvature. ROM normal. No CVA tenderness. Cardio: regular rate and rhythm, S1, S2 normal, no murmur, click, rub or gallop GI: soft, non-tender; bowel sounds normal; no masses,  no organomegaly Extremities: extremities normal, atraumatic, no cyanosis or edema Neurologic: Alert and oriented X 3, normal strength and tone. Normal symmetric reflexes. Normal coordination and gait  ECOG PERFORMANCE STATUS: 1 - Symptomatic but completely ambulatory  Blood pressure 149/84, pulse 86, temperature 97.6 F (36.4 C), temperature source Oral, resp. rate 18, height 5\' 2"  (1.575 m), weight 114 lb 12.8 oz (52.073 kg).  LABORATORY DATA: Lab Results  Component Value Date   WBC 2.4* 10/11/2013   HGB 11.3* 10/11/2013   HCT 34.2* 10/11/2013   MCV 88.4 10/11/2013   PLT 200 10/11/2013      Chemistry      Component Value Date/Time   NA 137 10/11/2013 0840   NA 131* 10/04/2013 1347    K 4.2 10/11/2013 0840   K 4.7 10/04/2013 1347   CL 97 10/04/2013 1347   CL 93* 07/10/2012 1544   CO2 27 10/11/2013 0840   CO2 26 10/04/2013 1347   BUN 24.3 10/11/2013 0840   BUN 20 10/04/2013 1347   CREATININE 1.1 10/11/2013 0840   CREATININE 1.04 10/04/2013 1347      Component Value Date/Time   CALCIUM 9.7 10/11/2013 0840   CALCIUM 8.9 10/04/2013 1347   ALKPHOS 94 10/11/2013 0840   ALKPHOS 84 10/04/2013 1347   AST 15 10/11/2013 0840   AST 19 10/04/2013 1347   ALT 10 10/11/2013 0840   ALT 17 10/04/2013 1347   BILITOT 0.27 10/11/2013 0840   BILITOT 0.2 10/04/2013 1347       RADIOGRAPHIC STUDIES:   ASSESSMENT AND PLAN: this is a very pleasant 73 years old white female with extensive stage small cell lung cancer status post 4 cycles of systemic chemotherapy with carboplatin and etoposide with significant improvement in her disease. She was on observation for few months and the patient had evidence for disease progression on restaging scan. She was started on second line chemotherapy with cisplatin and irinotecan currently status post 8 cycles. She  tolerated the last cycle of her treatment well with no significant adverse effects. I recommended for the patient to proceed with cycle #89 today as scheduled. She will follow up with Dr.  Julien Nordmann in 3 weeks with a restaging CT scan of her chest, abdomen and pelvis with contrast to re-evaluate her disease. She was advised to call immediately if she has any concerning symptoms in the interval. The patient voices understanding of current disease status and treatment options and is in agreement with the current care plan.  Carlton Adam, PA-C   Disclaimer: This note was dictated with voice recognition software. Similar sounding words can inadvertently be transcribed and may not be corrected upon review.

## 2013-10-12 LAB — URINE CULTURE

## 2013-10-15 NOTE — Patient Instructions (Signed)
Continue labs and chemotherapy as scheduled Follow up in 3 weeks with a restaging CT scan of your chest, abdomen and pelvis to re-evaluate your disease

## 2013-10-18 ENCOUNTER — Other Ambulatory Visit: Payer: Medicare Other

## 2013-10-18 ENCOUNTER — Other Ambulatory Visit (HOSPITAL_BASED_OUTPATIENT_CLINIC_OR_DEPARTMENT_OTHER): Payer: Medicare Other

## 2013-10-18 ENCOUNTER — Ambulatory Visit (HOSPITAL_BASED_OUTPATIENT_CLINIC_OR_DEPARTMENT_OTHER): Payer: Medicare Other

## 2013-10-18 VITALS — BP 162/84 | HR 87 | Temp 97.9°F | Resp 16

## 2013-10-18 DIAGNOSIS — C3492 Malignant neoplasm of unspecified part of left bronchus or lung: Secondary | ICD-10-CM

## 2013-10-18 DIAGNOSIS — Z5111 Encounter for antineoplastic chemotherapy: Secondary | ICD-10-CM

## 2013-10-18 LAB — CBC WITH DIFFERENTIAL/PLATELET
BASO%: 0.4 % (ref 0.0–2.0)
BASOS ABS: 0 10*3/uL (ref 0.0–0.1)
EOS ABS: 0 10*3/uL (ref 0.0–0.5)
EOS%: 0.7 % (ref 0.0–7.0)
HCT: 33.1 % — ABNORMAL LOW (ref 34.8–46.6)
HEMOGLOBIN: 10.9 g/dL — AB (ref 11.6–15.9)
LYMPH#: 0.7 10*3/uL — AB (ref 0.9–3.3)
LYMPH%: 28.9 % (ref 14.0–49.7)
MCH: 29.4 pg (ref 25.1–34.0)
MCHC: 33 g/dL (ref 31.5–36.0)
MCV: 89.1 fL (ref 79.5–101.0)
MONO#: 0.4 10*3/uL (ref 0.1–0.9)
MONO%: 18.1 % — ABNORMAL HIGH (ref 0.0–14.0)
NEUT%: 51.9 % (ref 38.4–76.8)
NEUTROS ABS: 1.2 10*3/uL — AB (ref 1.5–6.5)
Platelets: 173 10*3/uL (ref 145–400)
RBC: 3.71 10*6/uL (ref 3.70–5.45)
RDW: 15.1 % — AB (ref 11.2–14.5)
WBC: 2.4 10*3/uL — ABNORMAL LOW (ref 3.9–10.3)

## 2013-10-18 LAB — COMPREHENSIVE METABOLIC PANEL (CC13)
ALBUMIN: 3.4 g/dL — AB (ref 3.5–5.0)
ALT: 13 U/L (ref 0–55)
AST: 16 U/L (ref 5–34)
Alkaline Phosphatase: 86 U/L (ref 40–150)
Anion Gap: 7 mEq/L (ref 3–11)
BUN: 19 mg/dL (ref 7.0–26.0)
CALCIUM: 9.2 mg/dL (ref 8.4–10.4)
CHLORIDE: 101 meq/L (ref 98–109)
CO2: 28 mEq/L (ref 22–29)
Creatinine: 1.2 mg/dL — ABNORMAL HIGH (ref 0.6–1.1)
GLUCOSE: 72 mg/dL (ref 70–140)
POTASSIUM: 4.2 meq/L (ref 3.5–5.1)
Sodium: 135 mEq/L — ABNORMAL LOW (ref 136–145)
Total Bilirubin: 0.24 mg/dL (ref 0.20–1.20)
Total Protein: 6.2 g/dL — ABNORMAL LOW (ref 6.4–8.3)

## 2013-10-18 LAB — MAGNESIUM (CC13): MAGNESIUM: 2 mg/dL (ref 1.5–2.5)

## 2013-10-18 MED ORDER — DEXAMETHASONE SODIUM PHOSPHATE 20 MG/5ML IJ SOLN
12.0000 mg | Freq: Once | INTRAMUSCULAR | Status: AC
  Administered 2013-10-18: 12 mg via INTRAVENOUS

## 2013-10-18 MED ORDER — POTASSIUM CHLORIDE 2 MEQ/ML IV SOLN
Freq: Once | INTRAVENOUS | Status: AC
  Administered 2013-10-18: 11:00:00 via INTRAVENOUS
  Filled 2013-10-18: qty 10

## 2013-10-18 MED ORDER — CISPLATIN CHEMO INJECTION 100MG/100ML
25.0000 mg/m2 | Freq: Once | INTRAVENOUS | Status: AC
  Administered 2013-10-18: 36 mg via INTRAVENOUS
  Filled 2013-10-18: qty 36

## 2013-10-18 MED ORDER — IRINOTECAN HCL CHEMO INJECTION 100 MG/5ML
65.0000 mg/m2 | Freq: Once | INTRAVENOUS | Status: AC
  Administered 2013-10-18: 94 mg via INTRAVENOUS
  Filled 2013-10-18: qty 4.7

## 2013-10-18 MED ORDER — SODIUM CHLORIDE 0.9 % IV SOLN
Freq: Once | INTRAVENOUS | Status: AC
  Administered 2013-10-18: 11:00:00 via INTRAVENOUS

## 2013-10-18 MED ORDER — PALONOSETRON HCL INJECTION 0.25 MG/5ML
INTRAVENOUS | Status: AC
  Filled 2013-10-18: qty 5

## 2013-10-18 MED ORDER — SODIUM CHLORIDE 0.9 % IJ SOLN
10.0000 mL | INTRAMUSCULAR | Status: DC | PRN
  Administered 2013-10-18: 10 mL
  Filled 2013-10-18: qty 10

## 2013-10-18 MED ORDER — DEXAMETHASONE SODIUM PHOSPHATE 20 MG/5ML IJ SOLN
INTRAMUSCULAR | Status: AC
  Filled 2013-10-18: qty 5

## 2013-10-18 MED ORDER — SODIUM CHLORIDE 0.9 % IV SOLN
150.0000 mg | Freq: Once | INTRAVENOUS | Status: AC
  Administered 2013-10-18: 150 mg via INTRAVENOUS
  Filled 2013-10-18: qty 5

## 2013-10-18 MED ORDER — HEPARIN SOD (PORK) LOCK FLUSH 100 UNIT/ML IV SOLN
500.0000 [IU] | Freq: Once | INTRAVENOUS | Status: AC | PRN
  Administered 2013-10-18: 500 [IU]
  Filled 2013-10-18: qty 5

## 2013-10-18 MED ORDER — PALONOSETRON HCL INJECTION 0.25 MG/5ML
0.2500 mg | Freq: Once | INTRAVENOUS | Status: AC
  Administered 2013-10-18: 0.25 mg via INTRAVENOUS

## 2013-10-18 NOTE — Progress Notes (Signed)
Per Dr. Julien Nordmann.  Ok to treat today with ANC of 1.2.

## 2013-10-18 NOTE — Patient Instructions (Signed)
[facility] Discharge Instructions for Patients Receiving Chemotherapy  Today you received the following chemotherapy agents Cisplatin, VP16  To help prevent nausea and vomiting after your treatment, we encourage you to take your nausea medication as directed.   If you develop nausea and vomiting that is not controlled by your nausea medication, call the clinic.   BELOW ARE SYMPTOMS THAT SHOULD BE REPORTED IMMEDIATELY:  *FEVER GREATER THAN 100.5 F  *CHILLS WITH OR WITHOUT FEVER  NAUSEA AND VOMITING THAT IS NOT CONTROLLED WITH YOUR NAUSEA MEDICATION  *UNUSUAL SHORTNESS OF BREATH  *UNUSUAL BRUISING OR BLEEDING  TENDERNESS IN MOUTH AND THROAT WITH OR WITHOUT PRESENCE OF ULCERS  *URINARY PROBLEMS  *BOWEL PROBLEMS  UNUSUAL RASH Items with * indicate a potential emergency and should be followed up as soon as possible.  Feel free to call the clinic you have any questions or concerns. The clinic phone number is [phone].

## 2013-10-25 ENCOUNTER — Other Ambulatory Visit: Payer: Self-pay | Admitting: *Deleted

## 2013-10-25 ENCOUNTER — Other Ambulatory Visit: Payer: Medicare Other

## 2013-10-25 DIAGNOSIS — C3490 Malignant neoplasm of unspecified part of unspecified bronchus or lung: Secondary | ICD-10-CM

## 2013-10-26 ENCOUNTER — Ambulatory Visit (HOSPITAL_COMMUNITY)
Admission: RE | Admit: 2013-10-26 | Discharge: 2013-10-26 | Disposition: A | Discharge status: Home / Self Care | Payer: Medicare Other | Source: Ambulatory Visit | Attending: Physician Assistant | Admitting: Physician Assistant

## 2013-10-26 ENCOUNTER — Other Ambulatory Visit (HOSPITAL_BASED_OUTPATIENT_CLINIC_OR_DEPARTMENT_OTHER): Payer: Medicare Other

## 2013-10-26 ENCOUNTER — Encounter (HOSPITAL_COMMUNITY): Payer: Self-pay

## 2013-10-26 DIAGNOSIS — Z9221 Personal history of antineoplastic chemotherapy: Secondary | ICD-10-CM | POA: Insufficient documentation

## 2013-10-26 DIAGNOSIS — C7931 Secondary malignant neoplasm of brain: Secondary | ICD-10-CM | POA: Diagnosis not present

## 2013-10-26 DIAGNOSIS — C3492 Malignant neoplasm of unspecified part of left bronchus or lung: Secondary | ICD-10-CM

## 2013-10-26 DIAGNOSIS — C3490 Malignant neoplasm of unspecified part of unspecified bronchus or lung: Secondary | ICD-10-CM | POA: Insufficient documentation

## 2013-10-26 DIAGNOSIS — Z923 Personal history of irradiation: Secondary | ICD-10-CM | POA: Insufficient documentation

## 2013-10-26 LAB — CBC WITH DIFFERENTIAL/PLATELET
BASO%: 0.5 % (ref 0.0–2.0)
Basophils Absolute: 0 10*3/uL (ref 0.0–0.1)
EOS%: 0.6 % (ref 0.0–7.0)
Eosinophils Absolute: 0 10*3/uL (ref 0.0–0.5)
HEMATOCRIT: 32.8 % — AB (ref 34.8–46.6)
HGB: 10.8 g/dL — ABNORMAL LOW (ref 11.6–15.9)
LYMPH%: 24 % (ref 14.0–49.7)
MCH: 29.4 pg (ref 25.1–34.0)
MCHC: 33 g/dL (ref 31.5–36.0)
MCV: 88.9 fL (ref 79.5–101.0)
MONO#: 0.6 10*3/uL (ref 0.1–0.9)
MONO%: 19.2 % — AB (ref 0.0–14.0)
NEUT#: 1.7 10*3/uL (ref 1.5–6.5)
NEUT%: 55.7 % (ref 38.4–76.8)
PLATELETS: 218 10*3/uL (ref 145–400)
RBC: 3.69 10*6/uL — AB (ref 3.70–5.45)
RDW: 16 % — ABNORMAL HIGH (ref 11.2–14.5)
WBC: 3 10*3/uL — ABNORMAL LOW (ref 3.9–10.3)
lymph#: 0.7 10*3/uL — ABNORMAL LOW (ref 0.9–3.3)

## 2013-10-26 LAB — COMPREHENSIVE METABOLIC PANEL (CC13)
ALK PHOS: 93 U/L (ref 40–150)
ALT: 14 U/L (ref 0–55)
ANION GAP: 6 meq/L (ref 3–11)
AST: 17 U/L (ref 5–34)
Albumin: 3.8 g/dL (ref 3.5–5.0)
BILIRUBIN TOTAL: 0.26 mg/dL (ref 0.20–1.20)
BUN: 22.4 mg/dL (ref 7.0–26.0)
CO2: 28 meq/L (ref 22–29)
CREATININE: 1.1 mg/dL (ref 0.6–1.1)
Calcium: 9.5 mg/dL (ref 8.4–10.4)
Chloride: 101 mEq/L (ref 98–109)
Glucose: 88 mg/dl (ref 70–140)
Potassium: 5 mEq/L (ref 3.5–5.1)
SODIUM: 135 meq/L — AB (ref 136–145)
TOTAL PROTEIN: 6.7 g/dL (ref 6.4–8.3)

## 2013-10-26 MED ORDER — IOHEXOL 300 MG/ML  SOLN
100.0000 mL | Freq: Once | INTRAMUSCULAR | Status: AC | PRN
  Administered 2013-10-26: 100 mL via INTRAVENOUS

## 2013-10-26 MED ORDER — IOHEXOL 300 MG/ML  SOLN
50.0000 mL | Freq: Once | INTRAMUSCULAR | Status: AC | PRN
  Administered 2013-10-26: 50 mL via ORAL

## 2013-11-01 ENCOUNTER — Other Ambulatory Visit: Payer: Medicare Other

## 2013-11-01 ENCOUNTER — Encounter: Payer: Self-pay | Admitting: Physician Assistant

## 2013-11-01 ENCOUNTER — Telehealth: Payer: Self-pay | Admitting: Internal Medicine

## 2013-11-01 ENCOUNTER — Telehealth: Payer: Self-pay | Admitting: Medical Oncology

## 2013-11-01 ENCOUNTER — Ambulatory Visit (HOSPITAL_BASED_OUTPATIENT_CLINIC_OR_DEPARTMENT_OTHER): Payer: Medicare Other | Admitting: Physician Assistant

## 2013-11-01 ENCOUNTER — Ambulatory Visit: Payer: Medicare Other

## 2013-11-01 ENCOUNTER — Other Ambulatory Visit (HOSPITAL_BASED_OUTPATIENT_CLINIC_OR_DEPARTMENT_OTHER): Payer: Medicare Other

## 2013-11-01 VITALS — BP 159/85 | HR 92 | Temp 98.0°F | Resp 18 | Ht 62.0 in | Wt 115.9 lb

## 2013-11-01 DIAGNOSIS — R350 Frequency of micturition: Secondary | ICD-10-CM

## 2013-11-01 DIAGNOSIS — C7931 Secondary malignant neoplasm of brain: Secondary | ICD-10-CM

## 2013-11-01 DIAGNOSIS — R5383 Other fatigue: Secondary | ICD-10-CM

## 2013-11-01 DIAGNOSIS — C787 Secondary malignant neoplasm of liver and intrahepatic bile duct: Secondary | ICD-10-CM

## 2013-11-01 DIAGNOSIS — C3492 Malignant neoplasm of unspecified part of left bronchus or lung: Secondary | ICD-10-CM

## 2013-11-01 DIAGNOSIS — C3402 Malignant neoplasm of left main bronchus: Secondary | ICD-10-CM

## 2013-11-01 DIAGNOSIS — C3490 Malignant neoplasm of unspecified part of unspecified bronchus or lung: Secondary | ICD-10-CM

## 2013-11-01 LAB — COMPREHENSIVE METABOLIC PANEL (CC13)
ALBUMIN: 3.6 g/dL (ref 3.5–5.0)
ALK PHOS: 94 U/L (ref 40–150)
ALT: 13 U/L (ref 0–55)
AST: 17 U/L (ref 5–34)
Anion Gap: 8 mEq/L (ref 3–11)
BUN: 21.2 mg/dL (ref 7.0–26.0)
CALCIUM: 9.5 mg/dL (ref 8.4–10.4)
CHLORIDE: 100 meq/L (ref 98–109)
CO2: 26 mEq/L (ref 22–29)
Creatinine: 1.3 mg/dL — ABNORMAL HIGH (ref 0.6–1.1)
Glucose: 72 mg/dl (ref 70–140)
POTASSIUM: 4.2 meq/L (ref 3.5–5.1)
SODIUM: 134 meq/L — AB (ref 136–145)
TOTAL PROTEIN: 6.7 g/dL (ref 6.4–8.3)
Total Bilirubin: 0.3 mg/dL (ref 0.20–1.20)

## 2013-11-01 LAB — CBC WITH DIFFERENTIAL/PLATELET
BASO%: 0.4 % (ref 0.0–2.0)
BASOS ABS: 0 10*3/uL (ref 0.0–0.1)
EOS%: 0.8 % (ref 0.0–7.0)
Eosinophils Absolute: 0 10*3/uL (ref 0.0–0.5)
HEMATOCRIT: 33.6 % — AB (ref 34.8–46.6)
HEMOGLOBIN: 11.1 g/dL — AB (ref 11.6–15.9)
LYMPH#: 0.6 10*3/uL — AB (ref 0.9–3.3)
LYMPH%: 24.5 % (ref 14.0–49.7)
MCH: 29.3 pg (ref 25.1–34.0)
MCHC: 33 g/dL (ref 31.5–36.0)
MCV: 88.7 fL (ref 79.5–101.0)
MONO#: 0.6 10*3/uL (ref 0.1–0.9)
MONO%: 21.7 % — ABNORMAL HIGH (ref 0.0–14.0)
NEUT#: 1.3 10*3/uL — ABNORMAL LOW (ref 1.5–6.5)
NEUT%: 52.6 % (ref 38.4–76.8)
PLATELETS: 200 10*3/uL (ref 145–400)
RBC: 3.79 10*6/uL (ref 3.70–5.45)
RDW: 16.6 % — ABNORMAL HIGH (ref 11.2–14.5)
WBC: 2.5 10*3/uL — ABNORMAL LOW (ref 3.9–10.3)

## 2013-11-01 LAB — MAGNESIUM (CC13): Magnesium: 2.3 mg/dl (ref 1.5–2.5)

## 2013-11-01 MED ORDER — SULFAMETHOXAZOLE-TRIMETHOPRIM 800-160 MG PO TABS: ORAL_TABLET | ORAL | Status: DC

## 2013-11-01 MED ORDER — TEMOZOLOMIDE 250 MG PO CAPS: ORAL_CAPSULE | ORAL | Status: DC

## 2013-11-01 NOTE — Telephone Encounter (Signed)
Daughter asking about back up for bactrim in case she gets nauseated. PEr Adrena I instructed [name] to have her mom take it with food or with nausea med. I also instructed her to take Temodar on an empty stomach.

## 2013-11-01 NOTE — Progress Notes (Addendum)
[facility] [phone]   [phone]  SHARED VISIT PROGRESS NOTE   DIAGNOSIS AND STAGE: Extensive stage small cell lung cancer with large obstructing Center left lung mass with large left pleural effusion, liver and brain metastasis diagnosed in June of 2014   PRIOR THERAPY:  1) Status post whole brain irradiation under the care of Dr. Isidore Moos completed 09/11/2012.  2) whole brain irradiation under the care of Dr. Isidore Moos expected to be completed on 09/11/2012.  3) Systemic chemotherapy with carboplatin for AUC of 5 on day 1 and etoposide 120 mg/M2 on days 1, 2 and 3 with Neulasta support on day 4, status post 4 cycles, last cycle was given on 10/11/2012 with partial response.   CURRENT THERAPY:  Systemic chemotherapy with cisplatin 30 mg/M2 and irinotecan 65 mg/M2 on days 1 and 8 every 3 weeks, status post 9 cycles. First dose 01/16/2013. Restarted 08/30/2013  CHEMOTHERAPY INTENT: Palliative  CURRENT # OF CHEMOTHERAPY CYCLES: 9 CURRENT ANTIEMETICS: Zofran, dexamethasone and Compazine  CURRENT SMOKING STATUS: Former smoker  ORAL CHEMOTHERAPY AND CONSENT: None  CURRENT BISPHOSPHONATES USE: None  PAIN MANAGEMENT: 0/10 on Vicodin  NARCOTICS INDUCED CONSTIPATION: None  LIVING WILL AND CODE STATUS: no CODE BLUE   INTERVAL HISTORY: Cheryl Nielsen 73 y.o. female returns to the clinic today for follow up visit accompanied by a friend. She resumed systemic chemotherapy with cisplatin and irinotecan and is status post 3 cycles. She denied any issues with diarrhea or constipation, no nausea or vomiting, no fever or chills. She does report some occasional sudden shortness of breath that is brief and can occur either at rest or with exertion. She complains of not being in the sleep secondary to frequent urination at night. She would like to be referred to a urologist for further evaluation.  She denied having any significant chest pain, cough or hemoptysis. She has  no weight loss or night sweats.   MEDICAL HISTORY: Past Medical History  Diagnosis Date  . Pneumonia, organism unspecified   . Status post chemotherapy  10/11/2012     carboplatin  . S/P radiation therapy 08/24/2012-09/11/2012    Whole Brain  / 32.5 Gy in 13 fractions  . 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 S/P radiation therapy  07/24/2012-08/10/2012      Mediastinum and Left Hilum / 35 Gy in 14 fractions      ALLERGIES:  is allergic to asa; codeine; and tramadol.  MEDICATIONS:  Current Outpatient Prescriptions  Medication Sig Dispense Refill  . acetaminophen (TYLENOL) 500 MG tablet Take 1,000 mg by mouth every 6 (six) hours as needed (For headache.).      Marland Kitchen glucosamine-chondroitin 500-400 MG tablet Take 1 tablet by mouth daily with lunch.       . lidocaine-prilocaine (EMLA) cream Apply 1 application topically daily as needed (Applies to port-a-cath.).  30 g  0  . Multiple Vitamin (MULTIVITAMIN WITH MINERALS) TABS Take 1 tablet by mouth daily with lunch. She takes Dance movement psychotherapist Adult 50+.      . Omega-3 Fatty Acids (FISH OIL CONCENTRATE PO) Take by mouth. Pt unsure of dose, takes daily      . diphenoxylate-atropine (LOMOTIL) 2.5-0.025 MG per tablet Take 1 tablet by mouth 2 (two) times daily as needed for diarrhea or loose stools.  60 tablet  1  . ondansetron (ZOFRAN) 8 MG tablet Take 1 tablet (8 mg total) by mouth every 8 (eight)  hours as needed for nausea or vomiting.  30 tablet  0  . sulfamethoxazole-trimethoprim (BACTRIM DS,SEPTRA DS) 800-160 MG per tablet Take 1 tablet by mouth twice a day on Mondays and Thursdays  30 tablet  1  . temozolomide (TEMODAR) 250 MG capsule Take 1 capsule (250 mg total) by mouth daily for 5 days every 4 weeks.  5 capsule  2   No current facility-administered medications for this visit.    SURGICAL HISTORY:  Past Surgical History  Procedure Laterality Date  . Nasal sinus surgery    . Video bronchoscopy Bilateral  07/05/2012    Procedure: VIDEO BRONCHOSCOPY WITHOUT FLUORO;  Surgeon: Tanda Rockers, MD;  Location: [facility];  Service: Cardiopulmonary;  Laterality: Bilateral;  . Portacath placement      REVIEW OF SYSTEMS:  Constitutional: positive for fatigue Eyes: negative Ears, nose, mouth, throat, and face: negative Respiratory: positive for dyspnea on exertion Cardiovascular: negative Gastrointestinal: negative Genitourinary:negative Integument/breast: negative Hematologic/lymphatic: negative Musculoskeletal:negative Neurological: negative Behavioral/Psych: negative Endocrine: negative Allergic/Immunologic: negative   PHYSICAL EXAMINATION: General appearance: alert, cooperative, fatigued and no distress Head: Normocephalic, without obvious abnormality, atraumatic Neck: no adenopathy, no JVD, supple, symmetrical, trachea midline and thyroid not enlarged, symmetric, no tenderness/mass/nodules Lymph nodes: Cervical, supraclavicular, and axillary nodes normal. Resp: clear to auscultation bilaterally Back: symmetric, no curvature. ROM normal. No CVA tenderness. Cardio: regular rate and rhythm, S1, S2 normal, no murmur, click, rub or gallop GI: soft, non-tender; bowel sounds normal; no masses,  no organomegaly Extremities: extremities normal, atraumatic, no cyanosis or edema Neurologic: Alert and oriented X 3, normal strength and tone. Normal symmetric reflexes. Normal coordination and gait  ECOG PERFORMANCE STATUS: 1 - Symptomatic but completely ambulatory  Blood pressure 159/85, pulse 92, temperature 98 F (36.7 C), temperature source Oral, resp. rate 18, height 5\' 2"  (1.575 m), weight 115 lb 14.4 oz (52.572 kg), SpO2 100.00%.  LABORATORY DATA: Lab Results  Component Value Date   WBC 2.5* 11/01/2013   HGB 11.1* 11/01/2013   HCT 33.6* 11/01/2013   MCV 88.7 11/01/2013   PLT 200 11/01/2013      Chemistry      Component Value Date/Time   NA 134* 11/01/2013 0936   NA 131* 10/04/2013  1347   K 4.2 11/01/2013 0936   K 4.7 10/04/2013 1347   CL 97 10/04/2013 1347   CL 93* 07/10/2012 1544   CO2 26 11/01/2013 0936   CO2 26 10/04/2013 1347   BUN 21.2 11/01/2013 0936   BUN 20 10/04/2013 1347   CREATININE 1.3* 11/01/2013 0936   CREATININE 1.04 10/04/2013 1347      Component Value Date/Time   CALCIUM 9.5 11/01/2013 0936   CALCIUM 8.9 10/04/2013 1347   ALKPHOS 94 11/01/2013 0936   ALKPHOS 84 10/04/2013 1347   AST 17 11/01/2013 0936   AST 19 10/04/2013 1347   ALT 13 11/01/2013 0936   ALT 17 10/04/2013 1347   BILITOT 0.30 11/01/2013 0936   BILITOT 0.2 10/04/2013 1347       RADIOGRAPHIC STUDIES:  Ct Chest W Contrast  10/26/2013   CLINICAL DATA:  Subsequent encounter, Small cell lung cancer, unspecified laterality C34.90 (ICD-10-CM). Brain metastases. Radiation therapy completed 2014. Chemotherapy in progress. Restaging.  EXAM: CT CHEST, ABDOMEN, AND PELVIS WITH CONTRAST  TECHNIQUE: Multidetector CT imaging of the chest, abdomen and pelvis was performed following the standard protocol during bolus administration of intravenous contrast.  CONTRAST:  21mL OMNIPAQUE IOHEXOL 300 MG/ML SOLN, 111mL OMNIPAQUE IOHEXOL 300 MG/ML SOLN  COMPARISON:  08/22/2013.  FINDINGS: CT CHEST FINDINGS  Right IJ Port-A-Cath terminates in the low SVC. Supraclavicular and low internal jugular lymph nodes measure up to 6 mm in short axis on the left (previously 8 mm). Mediastinal adenopathy has decreased slightly in size in the interval. Index high right paratracheal lymph node measures 1.6 x 2.5 cm (image 18), previously 2.1 x 3.0 cm. No discrete hilar or axillary adenopathy. Atherosclerotic calcification of the arterial vasculature. Heart is at the upper limits of normal in size. Small pericardial fluid, similar. Lymph nodes adjacent to the distal descending thoracic aorta measure up to 7 mm, similar to the prior exam.  Mild centrilobular emphysema. Nodular opacity in the left upper lobe is grossly stable in size,  measuring 1.3 x 2.8 cm (image 20), with surrounding architectural distortion and volume loss. Previously measured soft tissue density in the medial left upper lobe is less prominent and therefore difficult to measure (corresponding image 24 on the current study). Previously measured nodular density in the left lower lobe is no longer are identified. Mild nodularity in the left lower lobe, less prominent as well. Probable irregular scarring with a central calcified granuloma in the apical segment right upper lobe (series 5, image 13), stable. Scattered tiny pulmonary nodular densities in the right lung are unchanged. No pleural fluid. Narrowing of the left upper lobe bronchi. Airway is otherwise unremarkable.  CT ABDOMEN AND PELVIS FINDINGS  Hepatobiliary: Liver measures 17.9 cm, at the upper limits of normal in size. A heterogeneous lesion in the right hepatic lobe has enlarged, measuring 1.9 x 2.4 cm (image 68), previously 1.1 x 1.2 cm. There are scattered discrete low-attenuation lesions in the liver, measuring up to 2.1 cm in the left hepatic lobe, as before. A mixed attenuation lesion in the adjacent left hepatic lobe measures 1.7 cm (series 2, image 54), stable from baseline examination of 07/14/2012. Gallbladder is unremarkable. No biliary ductal dilatation.  Pancreas: Negative.  Spleen: Negative.  Adrenals/Urinary Tract: Right adrenal nodule measures 1.3 x 2.4 cm, stable. Left adrenal nodule measures 1.3 x 1.5 cm (previously 1.5 x 2.2 cm). Low-attenuation lesions in the kidneys measure up to 11 mm in the upper pole right kidney, stable and likely cysts. Ureters are decompressed. Bladder is unremarkable.  Stomach/Bowel: The stomach is decompressed. Small bowel, appendix and colon are unremarkable.  Vascular/Lymphatic: Atherosclerotic calcification of the arterial vasculature without abdominal aortic aneurysm. Circumaortic left renal vein. No pathologically enlarged lymph nodes.  Reproductive: Uterus and  ovaries are visualized.  Other: No free fluid.  Musculoskeletal: Multiple scattered sclerotic lesions in the spine, pelvis and proximal right femur are stable from 08/22/2013 but new from baseline examination of 07/14/2012. No pathologic fracture.  IMPRESSION: 1. Mixed response to therapy. Slight decrease in size of low neck and mediastinal lymph nodes and decreased prominence of areas of confluent soft tissue in the left upper lobe. 2. Enlarging right hepatic lobe metastasis. 3. Stable right adrenal metastasis. Slight decrease in size of left adrenal metastasis. 4. Stable osseous metastatic disease.   Electronically Signed   By: Lorin Picket M.D.   On: 10/26/2013 13:15   [name] ER Contrast  10/08/2013   CLINICAL DATA:  73 year old female with metastatic lung cancer status post stereotactic radiosurgery. Restaging. Subsequent encounter.  EXAM: MRI HEAD WITHOUT AND WITH CONTRAST  TECHNIQUE: Multiplanar, multiecho pulse sequences of the brain and surrounding structures were obtained without and with intravenous contrast.  CONTRAST:  25mL MULTIHANCE GADOBENATE DIMEGLUMINE 529 MG/ML IV SOLN  COMPARISON:  05/18/2013 and earlier.  FINDINGS: Faint focus of enhancement in the right superior frontal gyrus on series 10, image 119, corresponding to the treated brain metastasis at that site which was last visible on 02/23/2013.  Probable small venous structure seen on series 10, image 100 just above the right lateral ventricle. No prior metastasis in this region.  There is a new small focus of chronic hemorrhage in the right superior parietal lobe (on series 5, image 23) with no associated enhancement or mass effect.  No new or active brain metastasis identified.  Vividly enhancing the bone metastases near the torcula (series 10, image 53) and at the anterior left middle cranial fossa (image 39) have not significantly changed.  Cannot exclude a small focus of restricted diffusion in the right posterior temporal lobe  on series 4, image 14. No associated enhancement. There may be faint associated T2 hyperintensity (series 6, image 13). Major intracranial vascular flow voids are stable. No other restricted diffusion. Negative pituitary, cervicomedullary junction and visualized cervical spine. No acute intracranial hemorrhage identified.  Visible internal auditory structures appear normal. Mastoids are clear. Stable minor paranasal sinus mucosal thickening. Visualized orbit soft tissues are within normal limits. Visualized scalp soft tissues are within normal limits.  IMPRESSION: 1. No definite new or active metastatic disease to the brain; - Faint visualization of the treated metastasis in the right superior frontal gyrus (series 10, image 119). - Probable normal venous structure on series 10, image 100. - Possible tiny lacunar infarct posterior right temporal lobe with no enhancement (series 4, image 14). - Small focus of hemorrhage in the right superior parietal lobe is new from prior studies, no associated enhancement.  2. No significant change in vividly enhancing bone metastases near the torcula, and at the left middle cranial fossa.   Electronically Signed   By: Lars Pinks M.D.   On: 10/08/2013 13:15   Ct Abdomen Pelvis W Contrast  10/26/2013   CLINICAL DATA:  Subsequent encounter, Small cell lung cancer, unspecified laterality C34.90 (ICD-10-CM). Brain metastases. Radiation therapy completed 2014. Chemotherapy in progress. Restaging.  EXAM: CT CHEST, ABDOMEN, AND PELVIS WITH CONTRAST  TECHNIQUE: Multidetector CT imaging of the chest, abdomen and pelvis was performed following the standard protocol during bolus administration of intravenous contrast.  CONTRAST:  80mL OMNIPAQUE IOHEXOL 300 MG/ML SOLN, 146mL OMNIPAQUE IOHEXOL 300 MG/ML SOLN  COMPARISON:  08/22/2013.  FINDINGS: CT CHEST FINDINGS  Right IJ Port-A-Cath terminates in the low SVC. Supraclavicular and low internal jugular lymph nodes measure up to 6 mm in short  axis on the left (previously 8 mm). Mediastinal adenopathy has decreased slightly in size in the interval. Index high right paratracheal lymph node measures 1.6 x 2.5 cm (image 18), previously 2.1 x 3.0 cm. No discrete hilar or axillary adenopathy. Atherosclerotic calcification of the arterial vasculature. Heart is at the upper limits of normal in size. Small pericardial fluid, similar. Lymph nodes adjacent to the distal descending thoracic aorta measure up to 7 mm, similar to the prior exam.  Mild centrilobular emphysema. Nodular opacity in the left upper lobe is grossly stable in size, measuring 1.3 x 2.8 cm (image 20), with surrounding architectural distortion and volume loss. Previously measured soft tissue density in the medial left upper lobe is less prominent and therefore difficult to measure (corresponding image 24 on the current study). Previously measured nodular density in the left lower lobe is no longer are identified. Mild nodularity in the left lower lobe, less prominent as well.  Probable irregular scarring with a central calcified granuloma in the apical segment right upper lobe (series 5, image 13), stable. Scattered tiny pulmonary nodular densities in the right lung are unchanged. No pleural fluid. Narrowing of the left upper lobe bronchi. Airway is otherwise unremarkable.  CT ABDOMEN AND PELVIS FINDINGS  Hepatobiliary: Liver measures 17.9 cm, at the upper limits of normal in size. A heterogeneous lesion in the right hepatic lobe has enlarged, measuring 1.9 x 2.4 cm (image 68), previously 1.1 x 1.2 cm. There are scattered discrete low-attenuation lesions in the liver, measuring up to 2.1 cm in the left hepatic lobe, as before. A mixed attenuation lesion in the adjacent left hepatic lobe measures 1.7 cm (series 2, image 54), stable from baseline examination of 07/14/2012. Gallbladder is unremarkable. No biliary ductal dilatation.  Pancreas: Negative.  Spleen: Negative.  Adrenals/Urinary Tract:  Right adrenal nodule measures 1.3 x 2.4 cm, stable. Left adrenal nodule measures 1.3 x 1.5 cm (previously 1.5 x 2.2 cm). Low-attenuation lesions in the kidneys measure up to 11 mm in the upper pole right kidney, stable and likely cysts. Ureters are decompressed. Bladder is unremarkable.  Stomach/Bowel: The stomach is decompressed. Small bowel, appendix and colon are unremarkable.  Vascular/Lymphatic: Atherosclerotic calcification of the arterial vasculature without abdominal aortic aneurysm. Circumaortic left renal vein. No pathologically enlarged lymph nodes.  Reproductive: Uterus and ovaries are visualized.  Other: No free fluid.  Musculoskeletal: Multiple scattered sclerotic lesions in the spine, pelvis and proximal right femur are stable from 08/22/2013 but new from baseline examination of 07/14/2012. No pathologic fracture.  IMPRESSION: 1. Mixed response to therapy. Slight decrease in size of low neck and mediastinal lymph nodes and decreased prominence of areas of confluent soft tissue in the left upper lobe. 2. Enlarging right hepatic lobe metastasis. 3. Stable right adrenal metastasis. Slight decrease in size of left adrenal metastasis. 4. Stable osseous metastatic disease.   Electronically Signed   By: Lorin Picket M.D.   On: 10/26/2013 13:15   ASSESSMENT AND PLAN: this is a very pleasant 73 years old white female with extensive stage small cell lung cancer status post 4 cycles of systemic chemotherapy with carboplatin and etoposide with significant improvement in her disease. She was on observation for few months and the patient had evidence for disease progression on restaging scan. She was started on second line chemotherapy with cisplatin and irinotecan currently status a total post 9 cycles ( status post 3 cycles of resumed chemotherapy). Patient was discussed with and also seen by Dr. Julien Nordmann. Her recent restaging CT scan revealed evidence for disease progression. We'll discontinue the  chemotherapy with cisplatin and irinotecan. Patient will be started on Temodar at 250 mg by mouth daily for 5 days every 4 weeks. Prescription for the Temodar as well as a prescription for Bactrim double strength one tablet twice daily on Mondays and Thursdays total of 30 with 1 refill was sent to the [facility]. She will followup in approximately 2 weeks for a symptom management visit.  She was advised to call immediately if she has any concerning symptoms in the interval. The patient voices understanding of current disease status and treatment options and is in agreement with the current care plan.  Carlton Adam PA-C  ADDENDUM: Hematology/Oncology Attending: I had a face to face encounter with the patient. I recommended her care plan. This is a very pleasant 73 years old white female with extensive stage small cell lung cancer diagnosed in June of  2014 status post several chemotherapy regimens as well as whole brain irradiation. Most recently she was treated with systemic chemotherapy in the form of cisplatin and irinotecan status post 9 cycles. She has been tolerating her treatment fairly well with no significant adverse effects except for mild fatigue. Unfortunately the recent CT scan of the chest, abdomen and pelvis showed mixed response with improvement in her disease in the chest but she has evidence for disease progression in the liver. I discussed the scan results with the patient today. I recommended for her to discontinue her current treatment with cisplatin and irinotecan. I discussed with the patient other treatment options including treatment with single agent paclitaxel or gemcitabine versus treatment with oral Temodar 150-200 mg/M2 for 5 days every 4 week. I discussed with the patient adverse effect of this treatment including myelosuppression, nausea and vomiting, alopecia, and increased risk for opportunistic infection like PCP.  The patient is interested in  treatment with Temodar. Her close calculated dose was 250 mg by mouth daily for 5 days every 4 week. We will send her prescription to [facility] outpatient pharmacy. We'll also started the patient on PCP prophylaxis with Bactrim DS on Monday and Thursday. She would come back for followup visit in 2 weeks for reevaluation and management any adverse effect of her treatment. She was advised to call immediately if she has any concerning symptoms in the interval.  Disclaimer: This note was dictated with voice recognition software. Similar sounding words can inadvertently be transcribed and may not be corrected upon review. Eilleen Kempf., MD 11/04/2013

## 2013-11-01 NOTE — Telephone Encounter (Signed)
Gave avs & apt for 11/15/13. Canceled all Labs& Tx per 11/01/13 [facility]

## 2013-11-03 NOTE — Patient Instructions (Signed)
Take Temodar 250 mg by mouth for 5 days every 4 weeks Take Bactrim one tablet twice daily on Mondays and Thursdays as prescribed Followup in 2 weeks for a symptom management visit

## 2013-11-05 ENCOUNTER — Telehealth: Payer: Self-pay | Admitting: *Deleted

## 2013-11-05 NOTE — Telephone Encounter (Signed)
[facility] faxed confirmation of facsimile receipt for Temodar prescription referral.  [facility] will verify insurance and make delivery arrangements with patient.

## 2013-11-07 ENCOUNTER — Telehealth: Payer: Self-pay

## 2013-11-07 NOTE — Telephone Encounter (Signed)
[facility] calling in regards to pt medication for Temodar, in need of ICD 10 code. Informed pt of ICD code c34.90. No further questions at this time.

## 2013-11-08 ENCOUNTER — Other Ambulatory Visit: Payer: Medicare Other

## 2013-11-08 ENCOUNTER — Telehealth: Payer: Self-pay | Admitting: *Deleted

## 2013-11-08 ENCOUNTER — Ambulatory Visit: Payer: Medicare Other

## 2013-11-08 NOTE — Telephone Encounter (Signed)
Pt's daughter called stating that she feels that "[facility] is giving them the run around" with the Temodar.  Called [facility].  They did not fully process both her [employer], only the medicare and this was resulting in a $1,500 co-pay.  They cannot afford a $1,500 co-pay.  They will process [employer] and see if this helps decrease the amount but they will also look into a reimbursement service as well.  Asked they call pt's daughter [name] with the information.  Called and left msg with [name] with information.

## 2013-11-12 ENCOUNTER — Encounter: Payer: Self-pay | Admitting: Internal Medicine

## 2013-11-12 ENCOUNTER — Telehealth: Payer: Self-pay | Admitting: Medical Oncology

## 2013-11-12 NOTE — Telephone Encounter (Signed)
CC phone message to [facility] for copay assistance.

## 2013-11-12 NOTE — Progress Notes (Signed)
Called [facility] @ [phone], per the rep the patient's copay is 0 and they will contact her to set up shipment.  Called patient's daughter and left this information on vm.

## 2013-11-14 ENCOUNTER — Other Ambulatory Visit: Payer: Self-pay | Admitting: Medical Oncology

## 2013-11-14 DIAGNOSIS — C3490 Malignant neoplasm of unspecified part of unspecified bronchus or lung: Secondary | ICD-10-CM

## 2013-11-14 MED ORDER — TEMOZOLOMIDE 250 MG PO CAPS: ORAL_CAPSULE | ORAL | Status: DC

## 2013-11-15 ENCOUNTER — Ambulatory Visit (HOSPITAL_BASED_OUTPATIENT_CLINIC_OR_DEPARTMENT_OTHER): Payer: Medicare Other | Admitting: Internal Medicine

## 2013-11-15 ENCOUNTER — Telehealth: Payer: Self-pay | Admitting: *Deleted

## 2013-11-15 ENCOUNTER — Telehealth: Payer: Self-pay | Admitting: Physician Assistant

## 2013-11-15 ENCOUNTER — Encounter: Payer: Self-pay | Admitting: Internal Medicine

## 2013-11-15 ENCOUNTER — Other Ambulatory Visit (HOSPITAL_BASED_OUTPATIENT_CLINIC_OR_DEPARTMENT_OTHER): Payer: Medicare Other

## 2013-11-15 VITALS — BP 151/91 | HR 111 | Temp 97.7°F | Resp 17 | Ht 62.0 in | Wt 112.8 lb

## 2013-11-15 DIAGNOSIS — C3492 Malignant neoplasm of unspecified part of left bronchus or lung: Secondary | ICD-10-CM

## 2013-11-15 DIAGNOSIS — C3402 Malignant neoplasm of left main bronchus: Secondary | ICD-10-CM

## 2013-11-15 LAB — CBC WITH DIFFERENTIAL/PLATELET
BASO%: 0.2 % (ref 0.0–2.0)
Basophils Absolute: 0 10*3/uL (ref 0.0–0.1)
EOS%: 0.2 % (ref 0.0–7.0)
Eosinophils Absolute: 0 10*3/uL (ref 0.0–0.5)
HEMATOCRIT: 36 % (ref 34.8–46.6)
HGB: 12.2 g/dL (ref 11.6–15.9)
LYMPH%: 19 % (ref 14.0–49.7)
MCH: 30.3 pg (ref 25.1–34.0)
MCHC: 33.9 g/dL (ref 31.5–36.0)
MCV: 89.3 fL (ref 79.5–101.0)
MONO#: 0.6 10*3/uL (ref 0.1–0.9)
MONO%: 12.6 % (ref 0.0–14.0)
NEUT#: 3.1 10*3/uL (ref 1.5–6.5)
NEUT%: 68 % (ref 38.4–76.8)
Platelets: 196 10*3/uL (ref 145–400)
RBC: 4.03 10*6/uL (ref 3.70–5.45)
RDW: 16.7 % — ABNORMAL HIGH (ref 11.2–14.5)
WBC: 4.5 10*3/uL (ref 3.9–10.3)
lymph#: 0.9 10*3/uL (ref 0.9–3.3)

## 2013-11-15 LAB — COMPREHENSIVE METABOLIC PANEL (CC13)
ALT: 17 U/L (ref 0–55)
AST: 24 U/L (ref 5–34)
Albumin: 4 g/dL (ref 3.5–5.0)
Alkaline Phosphatase: 86 U/L (ref 40–150)
Anion Gap: 9 mEq/L (ref 3–11)
BILIRUBIN TOTAL: 0.36 mg/dL (ref 0.20–1.20)
BUN: 24.5 mg/dL (ref 7.0–26.0)
CO2: 28 mEq/L (ref 22–29)
Calcium: 9.9 mg/dL (ref 8.4–10.4)
Chloride: 97 mEq/L — ABNORMAL LOW (ref 98–109)
Creatinine: 1.4 mg/dL — ABNORMAL HIGH (ref 0.6–1.1)
Glucose: 143 mg/dl — ABNORMAL HIGH (ref 70–140)
Potassium: 3.8 mEq/L (ref 3.5–5.1)
SODIUM: 134 meq/L — AB (ref 136–145)
Total Protein: 6.9 g/dL (ref 6.4–8.3)

## 2013-11-15 LAB — MAGNESIUM (CC13): Magnesium: 2.1 mg/dl (ref 1.5–2.5)

## 2013-11-15 MED ORDER — OXYCODONE-ACETAMINOPHEN 5-325 MG PO TABS: 1.0000 | ORAL_TABLET | Freq: Four times a day (QID) | ORAL | Status: DC | PRN

## 2013-11-15 MED ORDER — OLANZAPINE 10 MG PO TABS: 10.0000 mg | ORAL_TABLET | Freq: Every day | ORAL | Status: DC

## 2013-11-15 MED ORDER — ONDANSETRON HCL 8 MG PO TABS: 8.0000 mg | ORAL_TABLET | Freq: Three times a day (TID) | ORAL | Status: DC | PRN

## 2013-11-15 NOTE — Progress Notes (Signed)
[place] [phone]   [phone]  OFFICE PROGRESS NOTE   DIAGNOSIS AND STAGE: Extensive stage small cell lung cancer with large obstructing Center left lung mass with large left pleural effusion, liver and brain metastasis diagnosed in June of 2014   PRIOR THERAPY:  1) Status post whole brain irradiation under the care of Dr. Isidore Moos completed 09/11/2012.  2) whole brain irradiation under the care of Dr. Isidore Moos expected to be completed on 09/11/2012.  3) Systemic chemotherapy with carboplatin for AUC of 5 on day 1 and etoposide 120 mg/M2 on days 1, 2 and 3 with Neulasta support on day 4, status post 4 cycles, last cycle was given on 10/11/2012 with partial response.  4) Systemic chemotherapy with cisplatin 30 mg/M2 and irinotecan 65 mg/M2 on days 1 and 8 every 3 weeks, status post 9 cycles. First dose 01/16/2013. Last dose was given 10/18/2013.  CURRENT THERAPY:  Temodar 150 mg/M2 on days 1-5 every 4 weeks. She started the first dose of her treatment 11/14/2013.  CHEMOTHERAPY INTENT: Palliative  CURRENT # OF CHEMOTHERAPY CYCLES: 0 CURRENT ANTIEMETICS: Zofran, dexamethasone and Compazine  CURRENT SMOKING STATUS: Former smoker  ORAL CHEMOTHERAPY AND CONSENT: Temodar 250 mg for 5 days every 4 weeks. First dose 11/14/2013. CURRENT BISPHOSPHONATES USE: None  PAIN MANAGEMENT: 0/10 on Vicodin  NARCOTICS INDUCED CONSTIPATION: None  LIVING WILL AND CODE STATUS: no CODE BLUE   INTERVAL HISTORY: Cheryl Nielsen 73 y.o. female returns to the clinic today for follow up visit accompanied by her daughter Truman Hayward. The patient started yesterday the first dose of oral chemotherapy with Temodar 250 mg. She had significant nausea and mild vomiting after the first dose of her treatment. She took Compazine have in our before Temodar but was not helping. She is feeling a little bit better today. She denied having any significant fever or chills. She denied having any  significant chest pain, shortness of breath, cough or hemoptysis. She has no weight loss or night sweats. She has no fever or chills.   MEDICAL HISTORY: Past Medical History  Diagnosis Date  . Pneumonia, organism unspecified   . Status post chemotherapy  10/11/2012     carboplatin  . S/P radiation therapy 08/24/2012-09/11/2012    Whole Brain  / 32.5 Gy in 13 fractions  . 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 S/P radiation therapy  07/24/2012-08/10/2012      Mediastinum and Left Hilum / 35 Gy in 14 fractions      ALLERGIES:  is allergic to asa; codeine; and tramadol.  MEDICATIONS:  Current Outpatient Prescriptions  Medication Sig Dispense Refill  . diphenoxylate-atropine (LOMOTIL) 2.5-0.025 MG per tablet Take 1 tablet by mouth 2 (two) times daily as needed for diarrhea or loose stools.  60 tablet  1  . glucosamine-chondroitin 500-400 MG tablet Take 1 tablet by mouth daily with lunch.       . lidocaine-prilocaine (EMLA) cream Apply 1 application topically daily as needed (Applies to port-a-cath.).  30 g  0  . Multiple Vitamin (MULTIVITAMIN WITH MINERALS) TABS Take 1 tablet by mouth daily with lunch. She takes Dance movement psychotherapist Adult 50+.      . Omega-3 Fatty Acids (FISH OIL CONCENTRATE PO) Take by mouth. Pt unsure of dose, takes daily      . prochlorperazine (COMPAZINE) 10 MG tablet Take 10 mg by mouth every 6 (six) hours as needed for nausea or vomiting.      Marland Kitchen  temozolomide (TEMODAR) 250 MG capsule Take 1 capsule (250 mg total) by mouth daily for 5 days every 4 weeks.  5 capsule  2  . acetaminophen (TYLENOL) 500 MG tablet Take 1,000 mg by mouth every 6 (six) hours as needed (For headache.).      Marland Kitchen ondansetron (ZOFRAN) 8 MG tablet Take 1 tablet (8 mg total) by mouth every 8 (eight) hours as needed for nausea or vomiting.  30 tablet  0  . sulfamethoxazole-trimethoprim (BACTRIM DS,SEPTRA DS) 800-160 MG per tablet Take 1 tablet by mouth twice a day on Mondays and  Thursdays  30 tablet  1   No current facility-administered medications for this visit.    SURGICAL HISTORY:  Past Surgical History  Procedure Laterality Date  . Nasal sinus surgery    . Video bronchoscopy Bilateral 07/05/2012    Procedure: VIDEO BRONCHOSCOPY WITHOUT FLUORO;  Surgeon: Tanda Rockers, MD;  Location: [facility];  Service: Cardiopulmonary;  Laterality: Bilateral;  . Portacath placement      REVIEW OF SYSTEMS:  Constitutional: positive for fatigue Eyes: negative Ears, nose, mouth, throat, and face: negative Respiratory: positive for dyspnea on exertion Cardiovascular: negative Gastrointestinal: negative Genitourinary:negative Integument/breast: negative Hematologic/lymphatic: negative Musculoskeletal:negative Neurological: negative Behavioral/Psych: negative Endocrine: negative Allergic/Immunologic: negative   PHYSICAL EXAMINATION: General appearance: alert, cooperative, fatigued and no distress Head: Normocephalic, without obvious abnormality, atraumatic Neck: no adenopathy, no JVD, supple, symmetrical, trachea midline and thyroid not enlarged, symmetric, no tenderness/mass/nodules Lymph nodes: Cervical, supraclavicular, and axillary nodes normal. Resp: clear to auscultation bilaterally Back: symmetric, no curvature. ROM normal. No CVA tenderness. Cardio: regular rate and rhythm, S1, S2 normal, no murmur, click, rub or gallop GI: soft, non-tender; bowel sounds normal; no masses,  no organomegaly Extremities: extremities normal, atraumatic, no cyanosis or edema Neurologic: Alert and oriented X 3, normal strength and tone. Normal symmetric reflexes. Normal coordination and gait  ECOG PERFORMANCE STATUS: 1 - Symptomatic but completely ambulatory  Blood pressure 151/91, pulse 111, temperature 97.7 F (36.5 C), temperature source Oral, resp. rate 17, height 5\' 2"  (1.575 m), weight 112 lb 12.8 oz (51.166 kg), SpO2 100.00%.  LABORATORY DATA: Lab Results    Component Value Date   WBC 4.5 11/15/2013   HGB 12.2 11/15/2013   HCT 36.0 11/15/2013   MCV 89.3 11/15/2013   PLT 196 11/15/2013      Chemistry      Component Value Date/Time   NA 134* 11/15/2013 1124   NA 131* 10/04/2013 1347   K 3.8 11/15/2013 1124   K 4.7 10/04/2013 1347   CL 97 10/04/2013 1347   CL 93* 07/10/2012 1544   CO2 28 11/15/2013 1124   CO2 26 10/04/2013 1347   BUN 24.5 11/15/2013 1124   BUN 20 10/04/2013 1347   CREATININE 1.4* 11/15/2013 1124   CREATININE 1.04 10/04/2013 1347      Component Value Date/Time   CALCIUM 9.9 11/15/2013 1124   CALCIUM 8.9 10/04/2013 1347   ALKPHOS 86 11/15/2013 1124   ALKPHOS 84 10/04/2013 1347   AST 24 11/15/2013 1124   AST 19 10/04/2013 1347   ALT 17 11/15/2013 1124   ALT 17 10/04/2013 1347   BILITOT 0.36 11/15/2013 1124   BILITOT 0.2 10/04/2013 1347       RADIOGRAPHIC STUDIES:   ASSESSMENT AND PLAN: this is a very pleasant 73 years old white female with extensive stage small cell lung cancer status post 4 cycles of systemic chemotherapy with carboplatin and etoposide with significant improvement in her disease.  She was on observation for few months and the patient had evidence for disease progression on restaging scan. She was started on second line chemotherapy with cisplatin and irinotecan status post 9 cycles. This was discontinued secondary to disease progression. She is currently on treatment with Temodar 150 mg/M2 for 5 days every 4 weeks status post 1 dose yesterday. This was complicated with nausea and mild vomiting. I discussed with the patient changing the Antiemetic to Zofran 8 mg by mouth every 8 hours and to start the first dose of Zofran for nausea before her dose of Temodar. If no improvement in her nausea and vomiting I would consider her for treatment with either Ativan or Zyprexa. She would come back for followup visit in 2 weeks for reevaluation and management any adverse effect of her treatment. She was advised to  call immediately if she has any concerning symptoms in the interval. The patient voices understanding of current disease status and treatment options and is in agreement with the current care plan.  Disclaimer: This note was dictated with voice recognition software. Similar sounding words can inadvertently be transcribed and may not be corrected upon review.

## 2013-11-15 NOTE — Telephone Encounter (Signed)
Pt confirmed labs/ov per 10/29 POF, tried to call [facility] no answer will c/b to r/s pt, advised pt I would call her once I got this scheduled... gave pt AVS...Marland KitchenMarland KitchenMarland Kitchen KJ

## 2013-11-15 NOTE — Telephone Encounter (Signed)
Responded to a voice message from Barnabas Lister, Physical Therapist for [facility].  He stated that Ms. Dao was receiving weekly in home physical therapy, but no longer wishes to continue. Her assistive device is a walker with a seat.  He also relayed that she is experiencing increased pain in her neck and lower back.  This information was relayed, via phone, to Abelina Bachelor, RN for Dr. Julien Nordmann since Ms. Delpino has an appointment with him today.

## 2013-11-16 ENCOUNTER — Other Ambulatory Visit: Payer: Self-pay | Admitting: Medical Oncology

## 2013-11-16 DIAGNOSIS — C7949 Secondary malignant neoplasm of other parts of nervous system: Principal | ICD-10-CM

## 2013-11-16 DIAGNOSIS — C7931 Secondary malignant neoplasm of brain: Secondary | ICD-10-CM

## 2013-11-16 MED ORDER — PROCHLORPERAZINE MALEATE 10 MG PO TABS: 10.0000 mg | ORAL_TABLET | Freq: Four times a day (QID) | ORAL | Status: DC | PRN

## 2013-11-16 MED ORDER — ONDANSETRON HCL 8 MG PO TABS: 8.0000 mg | ORAL_TABLET | Freq: Three times a day (TID) | ORAL | Status: DC | PRN

## 2013-11-16 NOTE — Progress Notes (Addendum)
Daughter called and said local pharmacy did onto have any order for antiemetics. I resent to correct pharmacy.Daughter notified.

## 2013-11-20 ENCOUNTER — Other Ambulatory Visit: Payer: Self-pay | Admitting: Radiation Therapy

## 2013-11-20 ENCOUNTER — Telehealth: Payer: Self-pay | Admitting: Internal Medicine

## 2013-11-20 DIAGNOSIS — C7931 Secondary malignant neoplasm of brain: Secondary | ICD-10-CM

## 2013-11-20 NOTE — Telephone Encounter (Signed)
Lft msg for pt confirming MD visit at [facility] w/Dr. Louis Meckel on 12/03 at 1:15 per [place] ...... Sent in basket to Tiff/Kim for notes to be faxed over to MD @[facility] ...Marland KitchenMarland KitchenMarland Kitchen KJ

## 2013-11-21 ENCOUNTER — Telehealth: Payer: Self-pay | Admitting: Internal Medicine

## 2013-11-21 NOTE — Telephone Encounter (Signed)
FAXED PT Cheryl Nielsen

## 2013-12-03 ENCOUNTER — Other Ambulatory Visit (HOSPITAL_BASED_OUTPATIENT_CLINIC_OR_DEPARTMENT_OTHER): Payer: Medicare Other

## 2013-12-03 ENCOUNTER — Ambulatory Visit (HOSPITAL_BASED_OUTPATIENT_CLINIC_OR_DEPARTMENT_OTHER): Payer: Medicare Other | Admitting: Internal Medicine

## 2013-12-03 ENCOUNTER — Encounter: Payer: Self-pay | Admitting: Internal Medicine

## 2013-12-03 ENCOUNTER — Telehealth: Payer: Self-pay | Admitting: Internal Medicine

## 2013-12-03 VITALS — BP 120/67 | HR 107 | Temp 98.4°F | Resp 18 | Ht 62.0 in | Wt 114.0 lb

## 2013-12-03 DIAGNOSIS — C3492 Malignant neoplasm of unspecified part of left bronchus or lung: Secondary | ICD-10-CM

## 2013-12-03 DIAGNOSIS — C3402 Malignant neoplasm of left main bronchus: Secondary | ICD-10-CM

## 2013-12-03 LAB — COMPREHENSIVE METABOLIC PANEL (CC13)
ALT: 21 U/L (ref 0–55)
AST: 25 U/L (ref 5–34)
Albumin: 3.8 g/dL (ref 3.5–5.0)
Alkaline Phosphatase: 84 U/L (ref 40–150)
Anion Gap: 8 mEq/L (ref 3–11)
BUN: 26.4 mg/dL — ABNORMAL HIGH (ref 7.0–26.0)
CO2: 27 mEq/L (ref 22–29)
Calcium: 9.4 mg/dL (ref 8.4–10.4)
Chloride: 101 mEq/L (ref 98–109)
Creatinine: 1.6 mg/dL — ABNORMAL HIGH (ref 0.6–1.1)
Glucose: 98 mg/dl (ref 70–140)
POTASSIUM: 4.4 meq/L (ref 3.5–5.1)
Sodium: 136 mEq/L (ref 136–145)
Total Bilirubin: 0.26 mg/dL (ref 0.20–1.20)
Total Protein: 6.8 g/dL (ref 6.4–8.3)

## 2013-12-03 LAB — CBC WITH DIFFERENTIAL/PLATELET
BASO%: 0.4 % (ref 0.0–2.0)
BASOS ABS: 0 10*3/uL (ref 0.0–0.1)
EOS%: 1.1 % (ref 0.0–7.0)
Eosinophils Absolute: 0.1 10*3/uL (ref 0.0–0.5)
HCT: 36.1 % (ref 34.8–46.6)
HEMOGLOBIN: 11.7 g/dL (ref 11.6–15.9)
LYMPH#: 0.7 10*3/uL — AB (ref 0.9–3.3)
LYMPH%: 15.5 % (ref 14.0–49.7)
MCH: 30.3 pg (ref 25.1–34.0)
MCHC: 32.6 g/dL (ref 31.5–36.0)
MCV: 92.9 fL (ref 79.5–101.0)
MONO#: 0.7 10*3/uL (ref 0.1–0.9)
MONO%: 14.3 % — ABNORMAL HIGH (ref 0.0–14.0)
NEUT#: 3.2 10*3/uL (ref 1.5–6.5)
NEUT%: 68.7 % (ref 38.4–76.8)
Platelets: 264 10*3/uL (ref 145–400)
RBC: 3.88 10*6/uL (ref 3.70–5.45)
RDW: 17.4 % — ABNORMAL HIGH (ref 11.2–14.5)
WBC: 4.7 10*3/uL (ref 3.9–10.3)

## 2013-12-03 NOTE — Progress Notes (Signed)
[place] [phone]   [phone]  OFFICE PROGRESS NOTE   DIAGNOSIS AND STAGE: Extensive stage small cell lung cancer with large obstructing Center left lung mass with large left pleural effusion, liver and brain metastasis diagnosed in June of 2014   PRIOR THERAPY:  1) Status post whole brain irradiation under the care of Dr. Isidore Moos completed 09/11/2012.  2) whole brain irradiation under the care of Dr. Isidore Moos expected to be completed on 09/11/2012.  3) Systemic chemotherapy with carboplatin for AUC of 5 on day 1 and etoposide 120 mg/M2 on days 1, 2 and 3 with Neulasta support on day 4, status post 4 cycles, last cycle was given on 10/11/2012 with partial response.  4) Systemic chemotherapy with cisplatin 30 mg/M2 and irinotecan 65 mg/M2 on days 1 and 8 every 3 weeks, status post 9 cycles. First dose 01/16/2013. Last dose was given 10/18/2013.  CURRENT THERAPY:  Temodar 150 mg/M2 on days 1-5 every 4 weeks. She started the first dose of her treatment 11/14/2013.  CHEMOTHERAPY INTENT: Palliative  CURRENT # OF CHEMOTHERAPY CYCLES: 0 CURRENT ANTIEMETICS: Zofran, dexamethasone and Compazine  CURRENT SMOKING STATUS: Former smoker  ORAL CHEMOTHERAPY AND CONSENT: Temodar 250 mg for 5 days every 4 weeks. First dose 11/14/2013. CURRENT BISPHOSPHONATES USE: None  PAIN MANAGEMENT: 0/10 on Vicodin  NARCOTICS INDUCED CONSTIPATION: None  LIVING WILL AND CODE STATUS: no CODE BLUE   INTERVAL HISTORY: Cheryl Nielsen 73 y.o. female returns to the clinic today for follow up visit accompanied by her daughter Cheryl Nielsen. The patient started treatment with oral chemotherapy with Temodar 250 mg on 11/14/2013. She had significant nausea and mild vomiting after the first dose of her treatment but she did fine after that when she took her until atraumatic half an hour before Temodar. She is feeling fine today. She denied having any significant fever or chills. She denied  having any significant chest pain, shortness of breath, cough or hemoptysis. She has no weight loss or night sweats. She has no fever or chills.   MEDICAL HISTORY: Past Medical History  Diagnosis Date  . Pneumonia, organism unspecified   . Status post chemotherapy  10/11/2012     carboplatin  . S/P radiation therapy 08/24/2012-09/11/2012    Whole Brain  / 32.5 Gy in 13 fractions  . 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 S/P radiation therapy  07/24/2012-08/10/2012      Mediastinum and Left Hilum / 35 Gy in 14 fractions      ALLERGIES:  is allergic to asa; codeine; and tramadol.  MEDICATIONS:  Current Outpatient Prescriptions  Medication Sig Dispense Refill  . acetaminophen (TYLENOL) 500 MG tablet Take 1,000 mg by mouth every 6 (six) hours as needed (For headache.).    Marland Kitchen glucosamine-chondroitin 500-400 MG tablet Take 1 tablet by mouth daily with lunch.     . lidocaine-prilocaine (EMLA) cream Apply 1 application topically daily as needed (Applies to port-a-cath.). 30 g 0  . Multiple Vitamin (MULTIVITAMIN WITH MINERALS) TABS Take 1 tablet by mouth daily with lunch. She takes Dance movement psychotherapist Adult 50+.    . Omega-3 Fatty Acids (FISH OIL CONCENTRATE PO) Take by mouth. Pt unsure of dose, takes daily    . ondansetron (ZOFRAN) 8 MG tablet Take 1 tablet (8 mg total) by mouth every 8 (eight) hours as needed for nausea or vomiting. 20 tablet 0  . sulfamethoxazole-trimethoprim (BACTRIM DS,SEPTRA DS) 800-160 MG per  tablet Take 1 tablet by mouth twice a day on Mondays and Thursdays 30 tablet 1  . temozolomide (TEMODAR) 250 MG capsule Take 1 capsule (250 mg total) by mouth daily for 5 days every 4 weeks. 5 capsule 2  . diphenoxylate-atropine (LOMOTIL) 2.5-0.025 MG per tablet Take 1 tablet by mouth 2 (two) times daily as needed for diarrhea or loose stools. 60 tablet 1  . oxyCODONE-acetaminophen (PERCOCET/ROXICET) 5-325 MG per tablet Take 1 tablet by mouth every 6 (six) hours  as needed for severe pain. 40 tablet 0  . prochlorperazine (COMPAZINE) 10 MG tablet Take 1 tablet (10 mg total) by mouth every 6 (six) hours as needed for nausea or vomiting. 30 tablet 0   No current facility-administered medications for this visit.    SURGICAL HISTORY:  Past Surgical History  Procedure Laterality Date  . Nasal sinus surgery    . Video bronchoscopy Bilateral 07/05/2012    Procedure: VIDEO BRONCHOSCOPY WITHOUT FLUORO;  Surgeon: Tanda Rockers, MD;  Location: [facility];  Service: Cardiopulmonary;  Laterality: Bilateral;  . Portacath placement      REVIEW OF SYSTEMS:  A comprehensive review of systems was negative except for: Constitutional: positive for fatigue   PHYSICAL EXAMINATION: General appearance: alert, cooperative, fatigued and no distress Head: Normocephalic, without obvious abnormality, atraumatic Neck: no adenopathy, no JVD, supple, symmetrical, trachea midline and thyroid not enlarged, symmetric, no tenderness/mass/nodules Lymph nodes: Cervical, supraclavicular, and axillary nodes normal. Resp: clear to auscultation bilaterally Back: symmetric, no curvature. ROM normal. No CVA tenderness. Cardio: regular rate and rhythm, S1, S2 normal, no murmur, click, rub or gallop GI: soft, non-tender; bowel sounds normal; no masses,  no organomegaly Extremities: extremities normal, atraumatic, no cyanosis or edema Neurologic: Alert and oriented X 3, normal strength and tone. Normal symmetric reflexes. Normal coordination and gait  ECOG PERFORMANCE STATUS: 1 - Symptomatic but completely ambulatory  Blood pressure 120/67, pulse 107, temperature 98.4 F (36.9 C), temperature source Oral, resp. rate 18, height 5\' 2"  (1.575 m), weight 114 lb (51.71 kg), SpO2 100 %.  LABORATORY DATA: Lab Results  Component Value Date   WBC 4.7 12/03/2013   HGB 11.7 12/03/2013   HCT 36.1 12/03/2013   MCV 92.9 12/03/2013   PLT 264 12/03/2013      Chemistry      Component Value  Date/Time   NA 136 12/03/2013 1504   NA 131* 10/04/2013 1347   K 4.4 12/03/2013 1504   K 4.7 10/04/2013 1347   CL 97 10/04/2013 1347   CL 93* 07/10/2012 1544   CO2 27 12/03/2013 1504   CO2 26 10/04/2013 1347   BUN 26.4* 12/03/2013 1504   BUN 20 10/04/2013 1347   CREATININE 1.6* 12/03/2013 1504   CREATININE 1.04 10/04/2013 1347      Component Value Date/Time   CALCIUM 9.4 12/03/2013 1504   CALCIUM 8.9 10/04/2013 1347   ALKPHOS 84 12/03/2013 1504   ALKPHOS 84 10/04/2013 1347   AST 25 12/03/2013 1504   AST 19 10/04/2013 1347   ALT 21 12/03/2013 1504   ALT 17 10/04/2013 1347   BILITOT 0.26 12/03/2013 1504   BILITOT 0.2 10/04/2013 1347       RADIOGRAPHIC STUDIES:   ASSESSMENT AND PLAN: this is a very pleasant 73 years old white female with extensive stage small cell lung cancer status post 4 cycles of systemic chemotherapy with carboplatin and etoposide with significant improvement in her disease. She was on observation for few months and the patient had  evidence for disease progression on restaging scan. She was started on second line chemotherapy with cisplatin and irinotecan status post 9 cycles. This was discontinued secondary to disease progression. She is currently on treatment with Temodar 150 mg/M2 for 5 days every 4 weeks status post one cycle. She tolerated it well except for one day of nausea after the first dose of her treatment.  She will start the second cycle of her treatment with Temodar on 12/12/2013. She would come back for followup visit in 2 weeks for reevaluation and management any adverse effect of her treatment. She was advised to call immediately if she has any concerning symptoms in the interval. The patient voices understanding of current disease status and treatment options and is in agreement with the current care plan.  Disclaimer: This note was dictated with voice recognition software. Similar sounding words can inadvertently be transcribed and may not  be corrected upon review.

## 2013-12-03 NOTE — Telephone Encounter (Signed)
Gave avs & cal for Dec. Pt wanted to wait till next appt to sch flush appt.

## 2013-12-12 ENCOUNTER — Telehealth: Payer: Self-pay | Admitting: *Deleted

## 2013-12-12 ENCOUNTER — Other Ambulatory Visit: Payer: Self-pay | Admitting: *Deleted

## 2013-12-12 DIAGNOSIS — C3492 Malignant neoplasm of unspecified part of left bronchus or lung: Secondary | ICD-10-CM

## 2013-12-12 MED ORDER — OXYCODONE-ACETAMINOPHEN 5-325 MG PO TABS: 1.0000 | ORAL_TABLET | Freq: Four times a day (QID) | ORAL | Status: DC | PRN

## 2013-12-12 MED ORDER — METHYLPREDNISOLONE (PAK) 4 MG PO TABS: ORAL_TABLET | ORAL | Status: DC

## 2013-12-12 NOTE — Telephone Encounter (Signed)
Pt's daughter Marliss Czar called.  She is not on the HIPPA form, pt got on the phone and gave verbal permission to speak with Leigh.  Marliss Czar states that pt needs a rx for her pain medication and she does not have an appetite.  Per Dr Vista Mink, okay to refill percocet rx and call in a medrol dosepack.  Leigh verbalized understanding.

## 2013-12-18 ENCOUNTER — Other Ambulatory Visit (HOSPITAL_BASED_OUTPATIENT_CLINIC_OR_DEPARTMENT_OTHER): Payer: Medicare Other

## 2013-12-18 ENCOUNTER — Ambulatory Visit (HOSPITAL_BASED_OUTPATIENT_CLINIC_OR_DEPARTMENT_OTHER): Payer: Medicare Other

## 2013-12-18 ENCOUNTER — Telehealth: Payer: Self-pay | Admitting: Physician Assistant

## 2013-12-18 ENCOUNTER — Ambulatory Visit (HOSPITAL_BASED_OUTPATIENT_CLINIC_OR_DEPARTMENT_OTHER): Payer: Medicare Other | Admitting: Physician Assistant

## 2013-12-18 ENCOUNTER — Encounter: Payer: Self-pay | Admitting: Physician Assistant

## 2013-12-18 VITALS — BP 110/75 | HR 105 | Temp 97.8°F | Resp 18 | Ht 62.0 in | Wt 112.7 lb

## 2013-12-18 DIAGNOSIS — C787 Secondary malignant neoplasm of liver and intrahepatic bile duct: Secondary | ICD-10-CM

## 2013-12-18 DIAGNOSIS — C3492 Malignant neoplasm of unspecified part of left bronchus or lung: Secondary | ICD-10-CM

## 2013-12-18 DIAGNOSIS — C3402 Malignant neoplasm of left main bronchus: Secondary | ICD-10-CM

## 2013-12-18 DIAGNOSIS — C7931 Secondary malignant neoplasm of brain: Secondary | ICD-10-CM

## 2013-12-18 DIAGNOSIS — Z95828 Presence of other vascular implants and grafts: Secondary | ICD-10-CM

## 2013-12-18 LAB — COMPREHENSIVE METABOLIC PANEL (CC13)
ALK PHOS: 96 U/L (ref 40–150)
ALT: 37 U/L (ref 0–55)
AST: 44 U/L — ABNORMAL HIGH (ref 5–34)
Albumin: 4 g/dL (ref 3.5–5.0)
Anion Gap: 11 mEq/L (ref 3–11)
BILIRUBIN TOTAL: 0.3 mg/dL (ref 0.20–1.20)
BUN: 25.6 mg/dL (ref 7.0–26.0)
CO2: 26 mEq/L (ref 22–29)
Calcium: 9.7 mg/dL (ref 8.4–10.4)
Chloride: 98 mEq/L (ref 98–109)
Creatinine: 1.8 mg/dL — ABNORMAL HIGH (ref 0.6–1.1)
Glucose: 127 mg/dl (ref 70–140)
Potassium: 4.6 mEq/L (ref 3.5–5.1)
SODIUM: 134 meq/L — AB (ref 136–145)
TOTAL PROTEIN: 7 g/dL (ref 6.4–8.3)

## 2013-12-18 LAB — CBC WITH DIFFERENTIAL/PLATELET
BASO%: 0.3 % (ref 0.0–2.0)
Basophils Absolute: 0 10*3/uL (ref 0.0–0.1)
EOS%: 0.6 % (ref 0.0–7.0)
Eosinophils Absolute: 0 10*3/uL (ref 0.0–0.5)
HCT: 37.3 % (ref 34.8–46.6)
HGB: 12.4 g/dL (ref 11.6–15.9)
LYMPH%: 8.9 % — ABNORMAL LOW (ref 14.0–49.7)
MCH: 30.6 pg (ref 25.1–34.0)
MCHC: 33.2 g/dL (ref 31.5–36.0)
MCV: 92.1 fL (ref 79.5–101.0)
MONO#: 0.6 10*3/uL (ref 0.1–0.9)
MONO%: 9.2 % (ref 0.0–14.0)
NEUT#: 5.2 10*3/uL (ref 1.5–6.5)
NEUT%: 81 % — ABNORMAL HIGH (ref 38.4–76.8)
NRBC: 0 % (ref 0–0)
Platelets: 221 10*3/uL (ref 145–400)
RBC: 4.05 10*6/uL (ref 3.70–5.45)
RDW: 14.6 % — AB (ref 11.2–14.5)
WBC: 6.4 10*3/uL (ref 3.9–10.3)
lymph#: 0.6 10*3/uL — ABNORMAL LOW (ref 0.9–3.3)

## 2013-12-18 MED ORDER — HEPARIN SOD (PORK) LOCK FLUSH 100 UNIT/ML IV SOLN
500.0000 [IU] | Freq: Once | INTRAVENOUS | Status: AC
  Administered 2013-12-18: 500 [IU] via INTRAVENOUS
  Filled 2013-12-18: qty 5

## 2013-12-18 MED ORDER — SODIUM CHLORIDE 0.9 % IJ SOLN
10.0000 mL | INTRAMUSCULAR | Status: DC | PRN
  Administered 2013-12-18: 10 mL via INTRAVENOUS
  Filled 2013-12-18: qty 10

## 2013-12-18 NOTE — Patient Instructions (Signed)
For the constipation, take the magnesium citrate as instructed Take Miralax and Senokot-S regularly starting a few days before starting the Temodar and while taking the Temodar Follow up in 3 weeks with a restaging CT scan of yoyur chest, abdomen and pelvis to re-evaluate your disease

## 2013-12-18 NOTE — Progress Notes (Addendum)
[place] [phone]   [phone]  OFFICE PROGRESS NOTE   DIAGNOSIS AND STAGE: Extensive stage small cell lung cancer with large obstructing Center left lung mass with large left pleural effusion, liver and brain metastasis diagnosed in June of 2014   PRIOR THERAPY:  1) Status post whole brain irradiation under the care of Dr. Isidore Moos completed 09/11/2012.  2) whole brain irradiation under the care of Dr. Isidore Moos expected to be completed on 09/11/2012.  3) Systemic chemotherapy with carboplatin for AUC of 5 on day 1 and etoposide 120 mg/M2 on days 1, 2 and 3 with Neulasta support on day 4, status post 4 cycles, last cycle was given on 10/11/2012 with partial response.  4) Systemic chemotherapy with cisplatin 30 mg/M2 and irinotecan 65 mg/M2 on days 1 and 8 every 3 weeks, status post 9 cycles. First dose 01/16/2013. Last dose was given 10/18/2013.  CURRENT THERAPY:  Temodar 150 mg/M2 on days 1-5 every 4 weeks. She started the first dose of her treatment 11/14/2013. Status post 2 cycles  CHEMOTHERAPY INTENT: Palliative  CURRENT # OF CHEMOTHERAPY CYCLES: 0 CURRENT ANTIEMETICS: Zofran, dexamethasone and Compazine  CURRENT SMOKING STATUS: Former smoker  ORAL CHEMOTHERAPY AND CONSENT: Temodar 250 mg for 5 days every 4 weeks. First dose 11/14/2013. CURRENT BISPHOSPHONATES USE: None  PAIN MANAGEMENT: 0/10 on Vicodin  NARCOTICS INDUCED CONSTIPATION: None  LIVING WILL AND CODE STATUS: no CODE BLUE   INTERVAL HISTORY: Cheryl Nielsen 73 y.o. female returns to the clinic today for follow up visit accompanied by her daughter Cheryl Nielsen. The patient started treatment her second cycle with oral chemotherapy with Temodar 250 mg on 12/12/2013. She reports significant constipation. It has been 3 days since her last bowel movement. She took 3 Senokot S and a dose of MiraLax yesterday. She reports her appetite and fluid intake is good. She is otherwise feeling fine today.  She denied having any significant fever or chills. She denied having any significant chest pain, shortness of breath, cough or hemoptysis. She has no weight loss or night sweats. She has no fever or chills.   MEDICAL HISTORY: Past Medical History  Diagnosis Date  . Pneumonia, organism unspecified   . Status post chemotherapy  10/11/2012     carboplatin  . S/P radiation therapy 08/24/2012-09/11/2012    Whole Brain  / 32.5 Gy in 13 fractions  . 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 S/P radiation therapy  07/24/2012-08/10/2012      Mediastinum and Left Hilum / 35 Gy in 14 fractions      ALLERGIES:  is allergic to asa; codeine; and tramadol.  MEDICATIONS:  Current Outpatient Prescriptions  Medication Sig Dispense Refill  . acetaminophen (TYLENOL) 500 MG tablet Take 1,000 mg by mouth every 6 (six) hours as needed (For headache.).    Marland Kitchen glucosamine-chondroitin 500-400 MG tablet Take 1 tablet by mouth daily with lunch.     . lidocaine-prilocaine (EMLA) cream Apply 1 application topically daily as needed (Applies to port-a-cath.). 30 g 0  . Multiple Vitamin (MULTIVITAMIN WITH MINERALS) TABS Take 1 tablet by mouth daily with lunch. She takes Dance movement psychotherapist Adult 50+.    . Omega-3 Fatty Acids (FISH OIL CONCENTRATE PO) Take by mouth. Pt unsure of dose, takes daily    . oxyCODONE-acetaminophen (PERCOCET/ROXICET) 5-325 MG per tablet Take 1 tablet by mouth every 6 (six) hours as needed for severe pain. 40 tablet 0  .  prochlorperazine (COMPAZINE) 10 MG tablet Take 1 tablet (10 mg total) by mouth every 6 (six) hours as needed for nausea or vomiting. 30 tablet 0  . sulfamethoxazole-trimethoprim (BACTRIM DS,SEPTRA DS) 800-160 MG per tablet Take 1 tablet by mouth twice a day on Mondays and Thursdays 30 tablet 1  . temozolomide (TEMODAR) 250 MG capsule Take 1 capsule (250 mg total) by mouth daily for 5 days every 4 weeks. 5 capsule 2  . diphenoxylate-atropine (LOMOTIL) 2.5-0.025  MG per tablet Take 1 tablet by mouth 2 (two) times daily as needed for diarrhea or loose stools. (Patient not taking: Reported on 12/18/2013) 60 tablet 1  . methylPREDNIsolone (MEDROL DOSPACK) 4 MG tablet follow package directions (Patient not taking: Reported on 12/18/2013) 21 tablet 0  . ondansetron (ZOFRAN) 8 MG tablet Take 1 tablet (8 mg total) by mouth every 8 (eight) hours as needed for nausea or vomiting. (Patient not taking: Reported on 12/18/2013) 20 tablet 0   No current facility-administered medications for this visit.    SURGICAL HISTORY:  Past Surgical History  Procedure Laterality Date  . Nasal sinus surgery    . Video bronchoscopy Bilateral 07/05/2012    Procedure: VIDEO BRONCHOSCOPY WITHOUT FLUORO;  Surgeon: Tanda Rockers, MD;  Location: [facility];  Service: Cardiopulmonary;  Laterality: Bilateral;  . Portacath placement      REVIEW OF SYSTEMS:  A comprehensive review of systems was negative except for: Constitutional: positive for fatigue Gastrointestinal: positive for constipation   PHYSICAL EXAMINATION: General appearance: alert, cooperative, fatigued and no distress Head: Normocephalic, without obvious abnormality, atraumatic Neck: no adenopathy, no JVD, supple, symmetrical, trachea midline and thyroid not enlarged, symmetric, no tenderness/mass/nodules Lymph nodes: Cervical, supraclavicular, and axillary nodes normal. Resp: clear to auscultation bilaterally Back: symmetric, no curvature. ROM normal. No CVA tenderness. Cardio: regular rate and rhythm, S1, S2 normal, no murmur, click, rub or gallop GI: soft, non-tender; bowel sounds normal; no masses,  no organomegaly Extremities: extremities normal, atraumatic, no cyanosis or edema Neurologic: Alert and oriented X 3, normal strength and tone. Normal symmetric reflexes. Normal coordination and gait  ECOG PERFORMANCE STATUS: 1 - Symptomatic but completely ambulatory  Blood pressure 110/75, pulse 105, temperature 97.8  F (36.6 C), temperature source Oral, resp. rate 18, height 5\' 2"  (1.575 m), weight 112 lb 11.2 oz (51.12 kg).  LABORATORY DATA: Lab Results  Component Value Date   WBC 6.4 12/18/2013   HGB 12.4 12/18/2013   HCT 37.3 12/18/2013   MCV 92.1 12/18/2013   PLT 221 12/18/2013      Chemistry      Component Value Date/Time   NA 134* 12/18/2013 1419   NA 131* 10/04/2013 1347   K 4.6 12/18/2013 1419   K 4.7 10/04/2013 1347   CL 97 10/04/2013 1347   CL 93* 07/10/2012 1544   CO2 26 12/18/2013 1419   CO2 26 10/04/2013 1347   BUN 25.6 12/18/2013 1419   BUN 20 10/04/2013 1347   CREATININE 1.8* 12/18/2013 1419   CREATININE 1.04 10/04/2013 1347      Component Value Date/Time   CALCIUM 9.7 12/18/2013 1419   CALCIUM 8.9 10/04/2013 1347   ALKPHOS 96 12/18/2013 1419   ALKPHOS 84 10/04/2013 1347   AST 44* 12/18/2013 1419   AST 19 10/04/2013 1347   ALT 37 12/18/2013 1419   ALT 17 10/04/2013 1347   BILITOT 0.30 12/18/2013 1419   BILITOT 0.2 10/04/2013 1347       RADIOGRAPHIC STUDIES:   ASSESSMENT AND PLAN:  this is a very pleasant 73 years old white female with extensive stage small cell lung cancer status post 4 cycles of systemic chemotherapy with carboplatin and etoposide with significant improvement in her disease. She was on observation for few months and the patient had evidence for disease progression on restaging scan. She was started on second line chemotherapy with cisplatin and irinotecan status post 9 cycles. This was discontinued secondary to disease progression. She is currently on treatment with Temodar 150 mg/M2 for 5 days every 4 weeks status post 2 cycles. She tolerated it well except for one day of nausea after the first dose of her treatment and constipation.  The patient was discussed with and also seen by Dr. Julien Nordmann. She will follow up in 3 weeks with a restaging Ct scan of the chest, abdomen and pelvis with contrast to re-evaluate her disease prior to the start of  cycle #3. She will start the third cycle of her treatment with Temodar on 01/09/2014. For the constipation, the patient was advised to drink magnesium citrate as instructed. She is to take Miralax andSenokot S regularly starting a few days before the Temodar and while taking the Temodar to avoid the severe constipation with subsequent cycles. She was advised to call immediately if she has any concerning symptoms in the interval. The patient voices understanding of current disease status and treatment options and is in agreement with the current care plan.  Carlton Adam, PA-C 12/18/2013  ADDENDUM: Hematology/Oncology Attending: I had a face to face encounter with the patient today. I recommended her care plan. This is a very pleasant 73 years old white female with extensive stage small cell lung cancer status post treatment with several regimens of chemotherapy and currently on treatment with Temodar status post 2 cycles. She is supposed to start cycle #3 on 01/09/2014. I recommended for the patient to have repeat CT scan of the chest, abdomen and pelvis before starting the next cycle of her treatment for restaging of her disease. She would come back for follow-up visit at that time. For constipation she was advised to drink magnesium citrate for severe constipation. The patient was advised to call immediately if she has any concerning symptoms in the interval.  Disclaimer: This note was dictated with voice recognition software. Similar sounding words can inadvertently be transcribed and may not be corrected upon review. Eilleen Kempf., MD 12/18/2013

## 2013-12-18 NOTE — Patient Instructions (Signed)

## 2013-12-18 NOTE — Telephone Encounter (Signed)
Pt confirmed labs/ov per 12/01 POF, gave pt AVS.... KJ

## 2013-12-19 ENCOUNTER — Telehealth: Payer: Self-pay | Admitting: *Deleted

## 2013-12-19 NOTE — Telephone Encounter (Signed)
Pt called stating that per AJ at office visit yesterday she needs to use magnesium citrate for constipation.  Pt states that she drank the whole bottle and did not have a BM.  Per Dr Vista Mink, pt needs to try an enema and call us back with the results.  She verbalized understanding.

## 2013-12-24 ENCOUNTER — Telehealth: Payer: Self-pay | Admitting: Medical Oncology

## 2013-12-24 NOTE — Telephone Encounter (Signed)
temodar dispensed 12/10/13

## 2013-12-26 ENCOUNTER — Other Ambulatory Visit: Payer: Self-pay | Admitting: Medical Oncology

## 2013-12-26 DIAGNOSIS — C3492 Malignant neoplasm of unspecified part of left bronchus or lung: Secondary | ICD-10-CM

## 2013-12-26 MED ORDER — OXYCODONE-ACETAMINOPHEN 5-325 MG PO TABS: 1.0000 | ORAL_TABLET | Freq: Four times a day (QID) | ORAL | Status: DC | PRN

## 2013-12-26 NOTE — Progress Notes (Signed)
Daughter called for pt who requested refill for percocet.rx locked in injection room.

## 2014-01-04 ENCOUNTER — Ambulatory Visit (HOSPITAL_COMMUNITY)
Admission: RE | Admit: 2014-01-04 | Discharge: 2014-01-04 | Disposition: A | Discharge status: Home / Self Care | Payer: Medicare Other | Source: Ambulatory Visit | Attending: Physician Assistant | Admitting: Physician Assistant

## 2014-01-04 ENCOUNTER — Other Ambulatory Visit: Payer: Self-pay | Admitting: Physician Assistant

## 2014-01-04 DIAGNOSIS — C3492 Malignant neoplasm of unspecified part of left bronchus or lung: Secondary | ICD-10-CM

## 2014-01-04 DIAGNOSIS — Z923 Personal history of irradiation: Secondary | ICD-10-CM | POA: Insufficient documentation

## 2014-01-04 DIAGNOSIS — Z79899 Other long term (current) drug therapy: Secondary | ICD-10-CM | POA: Diagnosis not present

## 2014-01-04 DIAGNOSIS — R0602 Shortness of breath: Secondary | ICD-10-CM | POA: Insufficient documentation

## 2014-01-04 MED ORDER — IOHEXOL 300 MG/ML  SOLN
50.0000 mL | Freq: Once | INTRAMUSCULAR | Status: AC | PRN
  Administered 2014-01-04: 50 mL via ORAL

## 2014-01-08 ENCOUNTER — Ambulatory Visit (HOSPITAL_BASED_OUTPATIENT_CLINIC_OR_DEPARTMENT_OTHER): Payer: Medicare Other | Admitting: Lab

## 2014-01-08 ENCOUNTER — Encounter: Payer: Self-pay | Admitting: Internal Medicine

## 2014-01-08 ENCOUNTER — Ambulatory Visit (HOSPITAL_BASED_OUTPATIENT_CLINIC_OR_DEPARTMENT_OTHER): Payer: Medicare Other | Admitting: Internal Medicine

## 2014-01-08 VITALS — BP 128/83 | HR 106 | Temp 97.8°F | Resp 18 | Ht 62.0 in | Wt 111.2 lb

## 2014-01-08 DIAGNOSIS — C3492 Malignant neoplasm of unspecified part of left bronchus or lung: Secondary | ICD-10-CM

## 2014-01-08 DIAGNOSIS — C3402 Malignant neoplasm of left main bronchus: Secondary | ICD-10-CM

## 2014-01-08 DIAGNOSIS — C7931 Secondary malignant neoplasm of brain: Secondary | ICD-10-CM

## 2014-01-08 DIAGNOSIS — C787 Secondary malignant neoplasm of liver and intrahepatic bile duct: Secondary | ICD-10-CM

## 2014-01-08 DIAGNOSIS — J90 Pleural effusion, not elsewhere classified: Secondary | ICD-10-CM

## 2014-01-08 LAB — CBC WITH DIFFERENTIAL/PLATELET
BASO%: 0.6 % (ref 0.0–2.0)
Basophils Absolute: 0 10*3/uL (ref 0.0–0.1)
EOS%: 0.7 % (ref 0.0–7.0)
Eosinophils Absolute: 0 10*3/uL (ref 0.0–0.5)
HCT: 37.8 % (ref 34.8–46.6)
HGB: 12.2 g/dL (ref 11.6–15.9)
LYMPH%: 14.4 % (ref 14.0–49.7)
MCH: 30.3 pg (ref 25.1–34.0)
MCHC: 32.2 g/dL (ref 31.5–36.0)
MCV: 94.2 fL (ref 79.5–101.0)
MONO#: 0.6 10*3/uL (ref 0.1–0.9)
MONO%: 13 % (ref 0.0–14.0)
NEUT#: 3.5 10*3/uL (ref 1.5–6.5)
NEUT%: 71.3 % (ref 38.4–76.8)
Platelets: 206 10*3/uL (ref 145–400)
RBC: 4.01 10*6/uL (ref 3.70–5.45)
RDW: 13.7 % (ref 11.2–14.5)
WBC: 4.9 10*3/uL (ref 3.9–10.3)
lymph#: 0.7 10*3/uL — ABNORMAL LOW (ref 0.9–3.3)

## 2014-01-08 LAB — COMPREHENSIVE METABOLIC PANEL (CC13)
ALT: 39 U/L (ref 0–55)
AST: 42 U/L — ABNORMAL HIGH (ref 5–34)
Albumin: 3.8 g/dL (ref 3.5–5.0)
Alkaline Phosphatase: 116 U/L (ref 40–150)
Anion Gap: 10 mEq/L (ref 3–11)
BUN: 25.5 mg/dL (ref 7.0–26.0)
CO2: 29 mEq/L (ref 22–29)
Calcium: 9.6 mg/dL (ref 8.4–10.4)
Chloride: 98 mEq/L (ref 98–109)
Creatinine: 1.7 mg/dL — ABNORMAL HIGH (ref 0.6–1.1)
EGFR: 29 mL/min/{1.73_m2} — ABNORMAL LOW (ref >90)
Glucose: 112 mg/dl (ref 70–140)
Potassium: 4.2 mEq/L (ref 3.5–5.1)
SODIUM: 136 meq/L (ref 136–145)
Total Protein: 6.9 g/dL (ref 6.4–8.3)

## 2014-01-08 LAB — MAGNESIUM (CC13): Magnesium: 2.4 mg/dl (ref 1.5–2.5)

## 2014-01-08 MED ORDER — OXYCODONE-ACETAMINOPHEN 5-325 MG PO TABS: 1.0000 | ORAL_TABLET | Freq: Four times a day (QID) | ORAL | Status: DC | PRN

## 2014-01-08 NOTE — Progress Notes (Signed)
[place] [phone]   [phone]  OFFICE PROGRESS NOTE   DIAGNOSIS AND STAGE: Extensive stage small cell lung cancer with large obstructing Center left lung mass with large left pleural effusion, liver and brain metastasis diagnosed in June of 2014   PRIOR THERAPY:  1) Status post whole brain irradiation under the care of Dr. Isidore Moos completed 09/11/2012.  2) whole brain irradiation under the care of Dr. Isidore Moos expected to be completed on 09/11/2012.  3) Systemic chemotherapy with carboplatin for AUC of 5 on day 1 and etoposide 120 mg/M2 on days 1, 2 and 3 with Neulasta support on day 4, status post 4 cycles, last cycle was given on 10/11/2012 with partial response.  4) Systemic chemotherapy with cisplatin 30 mg/M2 and irinotecan 65 mg/M2 on days 1 and 8 every 3 weeks, status post 9 cycles. First dose 01/16/2013. Last dose was given 10/18/2013. 5) Temodar 150 mg/M2 on days 1-5 every 4 weeks. She started the first dose of her treatment 11/14/2013. Status post 2 months of treatment discontinued today secondary to disease progression  CURRENT THERAPY:  Systemic chemotherapy again with carboplatin for AUC of 5 on day 1 and etoposide 100 MG/M2 on days 1, 2 and 3 with Neulasta support on day 4. First cycle expected on 01/15/2014.  CHEMOTHERAPY INTENT: Palliative  CURRENT # OF CHEMOTHERAPY CYCLES: 0 CURRENT ANTIEMETICS: Zofran, dexamethasone and Compazine  CURRENT SMOKING STATUS: Former smoker  ORAL CHEMOTHERAPY AND CONSENT: Temodar 250 mg for 5 days every 4 weeks. First dose 11/14/2013. CURRENT BISPHOSPHONATES USE: None  PAIN MANAGEMENT: 0/10 on Vicodin  NARCOTICS INDUCED CONSTIPATION: None  LIVING WILL AND CODE STATUS: no CODE BLUE   INTERVAL HISTORY: Cheryl Nielsen 73 y.o. female returns to the clinic today for follow up visit accompanied by her daughter [name]. The patient tolerated the last 2 cycles of her oral chemotherapy with Temodar fairly  well except for initial nausea and vomiting that resolved after she received until Zofran before the Temodar doses. She is feeling fine today. She denied having any significant fever or chills. She denied having any significant chest pain, shortness of breath, cough or hemoptysis. She has no weight loss or night sweats. The patient had repeat CT scan of the chest, abdomen and pelvis performed recently and she is here for evaluation and discussion of her scan results.  MEDICAL HISTORY: Past Medical History  Diagnosis Date  . Pneumonia, organism unspecified   . Status post chemotherapy  10/11/2012     carboplatin  . S/P radiation therapy 08/24/2012-09/11/2012    Whole Brain  / 32.5 Gy in 13 fractions  . 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 S/P radiation therapy  07/24/2012-08/10/2012      Mediastinum and Left Hilum / 35 Gy in 14 fractions      ALLERGIES:  is allergic to asa; codeine; and tramadol.  MEDICATIONS:  Current Outpatient Prescriptions  Medication Sig Dispense Refill  . acetaminophen (TYLENOL) 500 MG tablet Take 1,000 mg by mouth every 6 (six) hours as needed (For headache.).    Marland Kitchen glucosamine-chondroitin 500-400 MG tablet Take 1 tablet by mouth daily with lunch.     . lidocaine-prilocaine (EMLA) cream Apply 1 application topically daily as needed (Applies to port-a-cath.). 30 g 0  . mirabegron ER (MYRBETRIQ) 25 MG TB24 tablet Take 25 mg by mouth daily.    . Multiple Vitamin (MULTIVITAMIN WITH MINERALS) TABS Take 1 tablet  by mouth daily with lunch. She takes Dance movement psychotherapist Adult 50+.    . Omega-3 Fatty Acids (FISH OIL CONCENTRATE PO) Take by mouth. Pt unsure of dose, takes daily    . ondansetron (ZOFRAN) 8 MG tablet Take 1 tablet (8 mg total) by mouth every 8 (eight) hours as needed for nausea or vomiting. 20 tablet 0  . oxyCODONE-acetaminophen (PERCOCET/ROXICET) 5-325 MG per tablet Take 1 tablet by mouth every 6 (six) hours as needed for severe pain. 40  tablet 0  . polyethylene glycol (MIRALAX / GLYCOLAX) packet Take 17 g by mouth daily as needed.    . prochlorperazine (COMPAZINE) 10 MG tablet Take 1 tablet (10 mg total) by mouth every 6 (six) hours as needed for nausea or vomiting. 30 tablet 0  . senna (SENOKOT) 8.6 MG TABS tablet Take 1 tablet by mouth daily as needed for mild constipation.    . sulfamethoxazole-trimethoprim (BACTRIM DS,SEPTRA DS) 800-160 MG per tablet Take 1 tablet by mouth twice a day on Mondays and Thursdays 30 tablet 1  . temozolomide (TEMODAR) 250 MG capsule Take 1 capsule (250 mg total) by mouth daily for 5 days every 4 weeks. 5 capsule 2  . diphenoxylate-atropine (LOMOTIL) 2.5-0.025 MG per tablet Take 1 tablet by mouth 2 (two) times daily as needed for diarrhea or loose stools. (Patient not taking: Reported on 12/18/2013) 60 tablet 1   No current facility-administered medications for this visit.    SURGICAL HISTORY:  Past Surgical History  Procedure Laterality Date  . Nasal sinus surgery    . Video bronchoscopy Bilateral 07/05/2012    Procedure: VIDEO BRONCHOSCOPY WITHOUT FLUORO;  Surgeon: Tanda Rockers, MD;  Location: [facility];  Service: Cardiopulmonary;  Laterality: Bilateral;  . Portacath placement      REVIEW OF SYSTEMS:  Constitutional: positive for fatigue Eyes: negative Ears, nose, mouth, throat, and face: negative Respiratory: positive for dyspnea on exertion Cardiovascular: negative Gastrointestinal: negative Genitourinary:negative Integument/breast: negative Hematologic/lymphatic: negative Musculoskeletal:negative Neurological: negative Behavioral/Psych: negative Endocrine: negative Allergic/Immunologic: negative   PHYSICAL EXAMINATION: General appearance: alert, cooperative, fatigued and no distress Head: Normocephalic, without obvious abnormality, atraumatic Neck: no adenopathy, no JVD, supple, symmetrical, trachea midline and thyroid not enlarged, symmetric, no  tenderness/mass/nodules Lymph nodes: Cervical, supraclavicular, and axillary nodes normal. Resp: clear to auscultation bilaterally Back: symmetric, no curvature. ROM normal. No CVA tenderness. Cardio: regular rate and rhythm, S1, S2 normal, no murmur, click, rub or gallop GI: soft, non-tender; bowel sounds normal; no masses,  no organomegaly Extremities: extremities normal, atraumatic, no cyanosis or edema Neurologic: Alert and oriented X 3, normal strength and tone. Normal symmetric reflexes. Normal coordination and gait  ECOG PERFORMANCE STATUS: 1 - Symptomatic but completely ambulatory  Blood pressure 128/83, pulse 106, temperature 97.8 F (36.6 C), temperature source Oral, resp. rate 18, height 5\' 2"  (1.575 m), weight 111 lb 3.2 oz (50.44 kg), SpO2 99 %.  LABORATORY DATA: Lab Results  Component Value Date   WBC 4.9 01/08/2014   HGB 12.2 01/08/2014   HCT 37.8 01/08/2014   MCV 94.2 01/08/2014   PLT 206 01/08/2014      Chemistry      Component Value Date/Time   NA 134* 12/18/2013 1419   NA 131* 10/04/2013 1347   K 4.6 12/18/2013 1419   K 4.7 10/04/2013 1347   CL 97 10/04/2013 1347   CL 93* 07/10/2012 1544   CO2 26 12/18/2013 1419   CO2 26 10/04/2013 1347   BUN 25.6 12/18/2013 1419   BUN  20 10/04/2013 1347   CREATININE 1.8* 12/18/2013 1419   CREATININE 1.04 10/04/2013 1347      Component Value Date/Time   CALCIUM 9.7 12/18/2013 1419   CALCIUM 8.9 10/04/2013 1347   ALKPHOS 96 12/18/2013 1419   ALKPHOS 84 10/04/2013 1347   AST 44* 12/18/2013 1419   AST 19 10/04/2013 1347   ALT 37 12/18/2013 1419   ALT 17 10/04/2013 1347   BILITOT 0.30 12/18/2013 1419   BILITOT 0.2 10/04/2013 1347       RADIOGRAPHIC STUDIES:  Ct Abdomen Pelvis Wo Contrast  01/04/2014   CLINICAL DATA:  Followup metastatic left small cell lung carcinoma. Shortness of breath. Ongoing chemotherapy. Previous radiation delivery. Restaging.  EXAM: CT CHEST, ABDOMEN AND PELVIS WITHOUT CONTRAST   TECHNIQUE: Multidetector CT imaging of the chest, abdomen and pelvis was performed following the standard protocol without IV contrast.  COMPARISON:  10/26/2013  FINDINGS: CT CHEST FINDINGS  Mediastinum/Hilar Regions: Increased superior mediastinal lymphadenopathy is seen encasing the great vessels. This measures 2.3 cm in short axis on image 13/series 2 compared to 1.6 cm previously.  Other Thoracic Lymphadenopathy: Mild increase in bilateral supraclavicular/ low jugular lymphadenopathy also noted. No axillary lymphadenopathy identified.  Lungs: Post treatment changes in the left upper lobe again noted. Several ill-defined masslike areas opacity in the peripheral left upper lobe show increased prominence since previous study. Largest currently measures 1.9 cm on image 23/series for and is new since previous study. This is consistent with increased tumor. A nodular density in the lingula on image 28/series for is also increased, currently measuring 0.8 x 1.2 cm compared to 0.6 x 1.0 cm previously. Tiny sub-cm pulmonary nodules in the left lower lobe show no significant change.  Pleura:  No evidence of effusion or mass.  Vascular/Cardiac:  No acute findings identified.  Other:  None.  Musculoskeletal: Small sclerotic bone metastases in the thoracic spine show no significant change.  CT ABDOMEN AND PELVIS FINDINGS  Hepatobiliary: Few tiny hepatic cysts again noted. A low attenuation mass in the central right hepatic lobe has increased in size since previous study, currently measuring 5.4 x 6.4 cm on image 63/series 2 compared to 1.9 x 2.4 cm previously.  Pancreas: No mass or inflammatory process visualized on this non-contrast exam.  Spleen:  Within normal limits in size.  Adrenal Glands: Right adrenal mass has increased in size, currently measuring 2.4 x 2.7 cm on image 53 compared to 1.3 x 2.4 cm previously. Left adrenal mass also shows mild increase in size, currently measuring 1.6 x 2.1 cm compared to 1.3 x 1.5 cm  previously.  Kidneys/Urinary tract: No evidence of urolithiasis or hydronephrosis.  Stomach/Bowel/Peritoneum:  Unremarkable.  Vascular/Lymphatic: No pathologically enlarged lymph nodes identified. Sub-cm right retrocrural lymph nodes are stable. No other significant abnormality identified.  Reproductive:  No mass or other significant abnormality noted.  Other:  None.  Musculoskeletal: Sclerotic bone metastases in the spine, pelvis, and right hip show no significant change.  IMPRESSION: Increased superior mediastinal and low cervical lymphadenopathy.  Increased ill-defined masslike opacities in the left upper lobe and lingula, consistent with carcinoma.  Increased size of right hepatic lobe metastasis.  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  Stable appearance of sclerotic bone metastases.   Electronically Signed   By: Earle Gell M.D.   On: 01/04/2014 16:19   Ct Chest Wo Contrast  01/04/2014   CLINICAL DATA:  Followup metastatic left small cell lung carcinoma. Shortness of breath. Ongoing chemotherapy. Previous radiation  delivery. Restaging.  EXAM: CT CHEST, ABDOMEN AND PELVIS WITHOUT CONTRAST  TECHNIQUE: Multidetector CT imaging of the chest, abdomen and pelvis was performed following the standard protocol without IV contrast.  COMPARISON:  10/26/2013  FINDINGS: CT CHEST FINDINGS  Mediastinum/Hilar Regions: Increased superior mediastinal lymphadenopathy is seen encasing the great vessels. This measures 2.3 cm in short axis on image 13/series 2 compared to 1.6 cm previously.  Other Thoracic Lymphadenopathy: Mild increase in bilateral supraclavicular/ low jugular lymphadenopathy also noted. No axillary lymphadenopathy identified.  Lungs: Post treatment changes in the left upper lobe again noted. Several ill-defined masslike areas opacity in the peripheral left upper lobe show increased prominence since previous study. Largest currently measures 1.9 cm on image 23/series for and is new since  previous study. This is consistent with increased tumor. A nodular density in the lingula on image 28/series for is also increased, currently measuring 0.8 x 1.2 cm compared to 0.6 x 1.0 cm previously. Tiny sub-cm pulmonary nodules in the left lower lobe show no significant change.  Pleura:  No evidence of effusion or mass.  Vascular/Cardiac:  No acute findings identified.  Other:  None.  Musculoskeletal: Small sclerotic bone metastases in the thoracic spine show no significant change.  CT ABDOMEN AND PELVIS FINDINGS  Hepatobiliary: Few tiny hepatic cysts again noted. A low attenuation mass in the central right hepatic lobe has increased in size since previous study, currently measuring 5.4 x 6.4 cm on image 63/series 2 compared to 1.9 x 2.4 cm previously.  Pancreas: No mass or inflammatory process visualized on this non-contrast exam.  Spleen:  Within normal limits in size.  Adrenal Glands: Right adrenal mass has increased in size, currently measuring 2.4 x 2.7 cm on image 53 compared to 1.3 x 2.4 cm previously. Left adrenal mass also shows mild increase in size, currently measuring 1.6 x 2.1 cm compared to 1.3 x 1.5 cm previously.  Kidneys/Urinary tract: No evidence of urolithiasis or hydronephrosis.  Stomach/Bowel/Peritoneum:  Unremarkable.  Vascular/Lymphatic: No pathologically enlarged lymph nodes identified. Sub-cm right retrocrural lymph nodes are stable. No other significant abnormality identified.  Reproductive:  No mass or other significant abnormality noted.  Other:  None.  Musculoskeletal: Sclerotic bone metastases in the spine, pelvis, and right hip show no significant change.  IMPRESSION: Increased superior mediastinal and low cervical lymphadenopathy.  Increased ill-defined masslike opacities in the left upper lobe and lingula, consistent with carcinoma.  Increased size of right hepatic lobe metastasis.  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  Stable appearance of sclerotic bone  metastases.   Electronically Signed   By: Earle Gell M.D.   On: 01/04/2014 16:19    ASSESSMENT AND PLAN: this is a very pleasant 73 years old white female with extensive stage small cell lung cancer status post 4 cycles of systemic chemotherapy with carboplatin and etoposide with significant improvement in her disease. She was on observation for few months and the patient had evidence for disease progression on restaging scan. She was started on second line chemotherapy with cisplatin and irinotecan status post 9 cycles. This was discontinued secondary to disease progression. She was also treated with 2 cycles of oral Temodar but discontinued today secondary to disease progression. I had a lengthy discussion with the patient and her daughter today about her current scan results and treatment options. I explained to the patient that she has evidence for disease progression in the lung as well as the liver and the bilateral adrenal metastasis. We discussed several  treatment options including resuming treatment with carboplatin and etoposide that was getting more than 15 months ago and the patient had partial response at that time for almost a year. We also discussed other options including single agent gemcitabine or paclitaxel. The patient and her daughter aren't interested in proceeding with the systemic chemotherapy with carboplatin and etoposide. I discussed with the patient adverse effect of this treatment which include but not limited to alopecia, myelosuppression, nausea and vomiting, peripheral neuropathy, liver or renal dysfunction. She is expected to start the first cycle of this treatment next week. She will come back for follow-up visit in 2 weeks for reevaluation and management of any adverse effect of her treatment. She was advised to call immediately if she has any concerning symptoms in the interval. The patient voices understanding of current disease status and treatment options and is  in agreement with the current care plan. I spent 20 minutes of face-to-face counseling with the patient and her daughter out of the total visit time 25 minutes.  Disclaimer: This note was dictated with voice recognition software. Similar sounding words can inadvertently be transcribed and may not be corrected upon review.

## 2014-01-09 ENCOUNTER — Telehealth: Payer: Self-pay | Admitting: *Deleted

## 2014-01-09 ENCOUNTER — Telehealth: Payer: Self-pay | Admitting: Internal Medicine

## 2014-01-09 NOTE — Telephone Encounter (Signed)
Per staff message and POF I have scheduled appts. Advised scheduler of appts. JMW  

## 2014-01-09 NOTE — Telephone Encounter (Signed)
Confirm appt for Dec.

## 2014-01-10 ENCOUNTER — Other Ambulatory Visit: Payer: Self-pay | Admitting: Physician Assistant

## 2014-01-15 ENCOUNTER — Ambulatory Visit (HOSPITAL_BASED_OUTPATIENT_CLINIC_OR_DEPARTMENT_OTHER): Payer: Medicare Other

## 2014-01-15 ENCOUNTER — Other Ambulatory Visit (HOSPITAL_BASED_OUTPATIENT_CLINIC_OR_DEPARTMENT_OTHER): Payer: Medicare Other

## 2014-01-15 DIAGNOSIS — C3492 Malignant neoplasm of unspecified part of left bronchus or lung: Secondary | ICD-10-CM

## 2014-01-15 DIAGNOSIS — C3402 Malignant neoplasm of left main bronchus: Secondary | ICD-10-CM

## 2014-01-15 DIAGNOSIS — Z5111 Encounter for antineoplastic chemotherapy: Secondary | ICD-10-CM

## 2014-01-15 LAB — CBC WITH DIFFERENTIAL/PLATELET
BASO%: 0.5 % (ref 0.0–2.0)
Basophils Absolute: 0 10*3/uL (ref 0.0–0.1)
EOS ABS: 0 10*3/uL (ref 0.0–0.5)
EOS%: 0.9 % (ref 0.0–7.0)
HEMATOCRIT: 39.8 % (ref 34.8–46.6)
HGB: 12.7 g/dL (ref 11.6–15.9)
LYMPH%: 10.8 % — AB (ref 14.0–49.7)
MCH: 29.9 pg (ref 25.1–34.0)
MCHC: 31.9 g/dL (ref 31.5–36.0)
MCV: 93.8 fL (ref 79.5–101.0)
MONO#: 0.4 10*3/uL (ref 0.1–0.9)
MONO%: 7.2 % (ref 0.0–14.0)
NEUT#: 4.1 10*3/uL (ref 1.5–6.5)
NEUT%: 80.6 % — AB (ref 38.4–76.8)
PLATELETS: 236 10*3/uL (ref 145–400)
RBC: 4.24 10*6/uL (ref 3.70–5.45)
RDW: 13.4 % (ref 11.2–14.5)
WBC: 5.1 10*3/uL (ref 3.9–10.3)
lymph#: 0.5 10*3/uL — ABNORMAL LOW (ref 0.9–3.3)

## 2014-01-15 LAB — COMPREHENSIVE METABOLIC PANEL (CC13)
ALT: 51 U/L (ref 0–55)
AST: 54 U/L — ABNORMAL HIGH (ref 5–34)
Albumin: 3.8 g/dL (ref 3.5–5.0)
Alkaline Phosphatase: 120 U/L (ref 40–150)
Anion Gap: 12 mEq/L — ABNORMAL HIGH (ref 3–11)
BILIRUBIN TOTAL: 0.39 mg/dL (ref 0.20–1.20)
BUN: 22.6 mg/dL (ref 7.0–26.0)
CALCIUM: 9.6 mg/dL (ref 8.4–10.4)
CHLORIDE: 96 meq/L — AB (ref 98–109)
CO2: 27 mEq/L (ref 22–29)
CREATININE: 1.5 mg/dL — AB (ref 0.6–1.1)
EGFR: 34 mL/min/{1.73_m2} — ABNORMAL LOW (ref >90)
GLUCOSE: 175 mg/dL — AB (ref 70–140)
Potassium: 4.2 mEq/L (ref 3.5–5.1)
Sodium: 135 mEq/L — ABNORMAL LOW (ref 136–145)
TOTAL PROTEIN: 7 g/dL (ref 6.4–8.3)

## 2014-01-15 LAB — MAGNESIUM (CC13): Magnesium: 2.4 mg/dl (ref 1.5–2.5)

## 2014-01-15 MED ORDER — DEXAMETHASONE SODIUM PHOSPHATE 20 MG/5ML IJ SOLN
INTRAMUSCULAR | Status: AC
  Filled 2014-01-15: qty 5

## 2014-01-15 MED ORDER — ONDANSETRON 16 MG/50ML IVPB (CHCC)
INTRAVENOUS | Status: AC
  Filled 2014-01-15: qty 16

## 2014-01-15 MED ORDER — SODIUM CHLORIDE 0.9 % IV SOLN
100.0000 mg/m2 | Freq: Once | INTRAVENOUS | Status: AC
  Administered 2014-01-15: 150 mg via INTRAVENOUS
  Filled 2014-01-15: qty 7.5

## 2014-01-15 MED ORDER — DEXAMETHASONE SODIUM PHOSPHATE 20 MG/5ML IJ SOLN
20.0000 mg | Freq: Once | INTRAMUSCULAR | Status: AC
  Administered 2014-01-15: 20 mg via INTRAVENOUS

## 2014-01-15 MED ORDER — SODIUM CHLORIDE 0.9 % IV SOLN
Freq: Once | INTRAVENOUS | Status: AC
  Administered 2014-01-15: 15:00:00 via INTRAVENOUS

## 2014-01-15 MED ORDER — SODIUM CHLORIDE 0.9 % IV SOLN
240.5000 mg | Freq: Once | INTRAVENOUS | Status: AC
  Administered 2014-01-15: 240 mg via INTRAVENOUS
  Filled 2014-01-15: qty 24

## 2014-01-15 MED ORDER — SODIUM CHLORIDE 0.9 % IJ SOLN
10.0000 mL | INTRAMUSCULAR | Status: DC | PRN
  Administered 2014-01-15: 10 mL
  Filled 2014-01-15: qty 10

## 2014-01-15 MED ORDER — ONDANSETRON 16 MG/50ML IVPB (CHCC)
16.0000 mg | Freq: Once | INTRAVENOUS | Status: AC
  Administered 2014-01-15: 16 mg via INTRAVENOUS

## 2014-01-15 MED ORDER — HEPARIN SOD (PORK) LOCK FLUSH 100 UNIT/ML IV SOLN
500.0000 [IU] | Freq: Once | INTRAVENOUS | Status: AC | PRN
  Administered 2014-01-15: 500 [IU]
  Filled 2014-01-15: qty 5

## 2014-01-15 NOTE — Patient Instructions (Signed)
[facility] Discharge Instructions for Patients Receiving Chemotherapy  Today you received the following chemotherapy agents: Carboplatin, Etoposide  To help prevent nausea and vomiting after your treatment, we encourage you to take your nausea medication as prescribed by your physician.   If you develop nausea and vomiting that is not controlled by your nausea medication, call the clinic.   BELOW ARE SYMPTOMS THAT SHOULD BE REPORTED IMMEDIATELY:  *FEVER GREATER THAN 100.5 F  *CHILLS WITH OR WITHOUT FEVER  NAUSEA AND VOMITING THAT IS NOT CONTROLLED WITH YOUR NAUSEA MEDICATION  *UNUSUAL SHORTNESS OF BREATH  *UNUSUAL BRUISING OR BLEEDING  TENDERNESS IN MOUTH AND THROAT WITH OR WITHOUT PRESENCE OF ULCERS  *URINARY PROBLEMS  *BOWEL PROBLEMS  UNUSUAL RASH Items with * indicate a potential emergency and should be followed up as soon as possible.  Feel free to call the clinic you have any questions or concerns. The clinic phone number is [phone].

## 2014-01-15 NOTE — Progress Notes (Signed)
Per Dr. Julien Nordmann, okay to tx with 01/08/14 CMET results; creatinine reviewed with MD, okay to tx.

## 2014-01-16 ENCOUNTER — Ambulatory Visit (HOSPITAL_BASED_OUTPATIENT_CLINIC_OR_DEPARTMENT_OTHER): Payer: Medicare Other

## 2014-01-16 DIAGNOSIS — Z5111 Encounter for antineoplastic chemotherapy: Secondary | ICD-10-CM

## 2014-01-16 DIAGNOSIS — C3402 Malignant neoplasm of left main bronchus: Secondary | ICD-10-CM

## 2014-01-16 DIAGNOSIS — C3492 Malignant neoplasm of unspecified part of left bronchus or lung: Secondary | ICD-10-CM

## 2014-01-16 MED ORDER — HEPARIN SOD (PORK) LOCK FLUSH 100 UNIT/ML IV SOLN
500.0000 [IU] | Freq: Once | INTRAVENOUS | Status: AC | PRN
  Administered 2014-01-16: 500 [IU]
  Filled 2014-01-16: qty 5

## 2014-01-16 MED ORDER — SODIUM CHLORIDE 0.9 % IJ SOLN
10.0000 mL | INTRAMUSCULAR | Status: DC | PRN
  Administered 2014-01-16: 10 mL
  Filled 2014-01-16: qty 10

## 2014-01-16 MED ORDER — PROCHLORPERAZINE MALEATE 10 MG PO TABS
ORAL_TABLET | ORAL | Status: AC
  Filled 2014-01-16: qty 1

## 2014-01-16 MED ORDER — SODIUM CHLORIDE 0.9 % IV SOLN
100.0000 mg/m2 | Freq: Once | INTRAVENOUS | Status: AC
  Administered 2014-01-16: 150 mg via INTRAVENOUS
  Filled 2014-01-16: qty 7.5

## 2014-01-16 MED ORDER — SODIUM CHLORIDE 0.9 % IV SOLN
Freq: Once | INTRAVENOUS | Status: AC
  Administered 2014-01-16: 15:00:00 via INTRAVENOUS

## 2014-01-16 MED ORDER — PROCHLORPERAZINE MALEATE 10 MG PO TABS
10.0000 mg | ORAL_TABLET | Freq: Once | ORAL | Status: AC
  Administered 2014-01-16: 10 mg via ORAL

## 2014-01-16 NOTE — Patient Instructions (Signed)
[facility] Discharge Instructions for Patients Receiving Chemotherapy  Today you received the following chemotherapy agents etoposide  To help prevent nausea and vomiting after your treatment, we encourage you to take your nausea medication if needed   If you develop nausea and vomiting that is not controlled by your nausea medication, call the clinic.   BELOW ARE SYMPTOMS THAT SHOULD BE REPORTED IMMEDIATELY:  *FEVER GREATER THAN 100.5 F  *CHILLS WITH OR WITHOUT FEVER  NAUSEA AND VOMITING THAT IS NOT CONTROLLED WITH YOUR NAUSEA MEDICATION  *UNUSUAL SHORTNESS OF BREATH  *UNUSUAL BRUISING OR BLEEDING  TENDERNESS IN MOUTH AND THROAT WITH OR WITHOUT PRESENCE OF ULCERS  *URINARY PROBLEMS  *BOWEL PROBLEMS  UNUSUAL RASH Items with * indicate a potential emergency and should be followed up as soon as possible.  Feel free to call the clinic you have any questions or concerns. The clinic phone number is [phone].

## 2014-01-17 ENCOUNTER — Ambulatory Visit (HOSPITAL_BASED_OUTPATIENT_CLINIC_OR_DEPARTMENT_OTHER): Payer: Medicare Other

## 2014-01-17 ENCOUNTER — Telehealth: Payer: Self-pay | Admitting: Internal Medicine

## 2014-01-17 DIAGNOSIS — C3492 Malignant neoplasm of unspecified part of left bronchus or lung: Secondary | ICD-10-CM

## 2014-01-17 DIAGNOSIS — C787 Secondary malignant neoplasm of liver and intrahepatic bile duct: Secondary | ICD-10-CM

## 2014-01-17 DIAGNOSIS — C3402 Malignant neoplasm of left main bronchus: Secondary | ICD-10-CM

## 2014-01-17 DIAGNOSIS — Z5111 Encounter for antineoplastic chemotherapy: Secondary | ICD-10-CM

## 2014-01-17 DIAGNOSIS — C7931 Secondary malignant neoplasm of brain: Secondary | ICD-10-CM

## 2014-01-17 MED ORDER — HEPARIN SOD (PORK) LOCK FLUSH 100 UNIT/ML IV SOLN
500.0000 [IU] | Freq: Once | INTRAVENOUS | Status: AC | PRN
  Administered 2014-01-17: 500 [IU]
  Filled 2014-01-17: qty 5

## 2014-01-17 MED ORDER — SODIUM CHLORIDE 0.9 % IV SOLN
Freq: Once | INTRAVENOUS | Status: AC
  Administered 2014-01-17: 16:00:00 via INTRAVENOUS

## 2014-01-17 MED ORDER — PROCHLORPERAZINE MALEATE 10 MG PO TABS
ORAL_TABLET | ORAL | Status: AC
  Filled 2014-01-17: qty 1

## 2014-01-17 MED ORDER — SODIUM CHLORIDE 0.9 % IV SOLN
100.0000 mg/m2 | Freq: Once | INTRAVENOUS | Status: AC
  Administered 2014-01-17: 150 mg via INTRAVENOUS
  Filled 2014-01-17: qty 7.5

## 2014-01-17 MED ORDER — SODIUM CHLORIDE 0.9 % IJ SOLN
10.0000 mL | INTRAMUSCULAR | Status: DC | PRN
  Administered 2014-01-17: 10 mL
  Filled 2014-01-17: qty 10

## 2014-01-17 MED ORDER — PROCHLORPERAZINE MALEATE 10 MG PO TABS: 10.0000 mg | ORAL_TABLET | Freq: Once | ORAL | Status: DC

## 2014-01-17 NOTE — Telephone Encounter (Signed)
pt dtr stopped by to move 1/5 lb/fu to PM. dtr has new appt time and schedule.

## 2014-01-17 NOTE — Patient Instructions (Signed)
[facility] Discharge Instructions for Patients Receiving Chemotherapy  Today you received the following chemotherapy agents Etoposide  To help prevent nausea and vomiting after your treatment, we encourage you to take your nausea medication as directed   If you develop nausea and vomiting that is not controlled by your nausea medication, call the clinic.   BELOW ARE SYMPTOMS THAT SHOULD BE REPORTED IMMEDIATELY:  *FEVER GREATER THAN 100.5 F  *CHILLS WITH OR WITHOUT FEVER  NAUSEA AND VOMITING THAT IS NOT CONTROLLED WITH YOUR NAUSEA MEDICATION  *UNUSUAL SHORTNESS OF BREATH  *UNUSUAL BRUISING OR BLEEDING  TENDERNESS IN MOUTH AND THROAT WITH OR WITHOUT PRESENCE OF ULCERS  *URINARY PROBLEMS  *BOWEL PROBLEMS  UNUSUAL RASH Items with * indicate a potential emergency and should be followed up as soon as possible.  Feel free to call the clinic you have any questions or concerns. The clinic phone number is [phone].

## 2014-01-19 ENCOUNTER — Telehealth: Payer: Self-pay | Admitting: *Deleted

## 2014-01-19 ENCOUNTER — Ambulatory Visit (HOSPITAL_BASED_OUTPATIENT_CLINIC_OR_DEPARTMENT_OTHER): Payer: Medicare Other

## 2014-01-19 DIAGNOSIS — C3492 Malignant neoplasm of unspecified part of left bronchus or lung: Secondary | ICD-10-CM

## 2014-01-19 DIAGNOSIS — C3402 Malignant neoplasm of left main bronchus: Secondary | ICD-10-CM | POA: Diagnosis not present

## 2014-01-19 MED ORDER — PEGFILGRASTIM INJECTION 6 MG/0.6ML ~~LOC~~
6.0000 mg | PREFILLED_SYRINGE | Freq: Once | SUBCUTANEOUS | Status: AC
  Administered 2014-01-19: 6 mg via SUBCUTANEOUS

## 2014-01-19 NOTE — Telephone Encounter (Signed)
Pt states no new n/v, diarrhea, or pain.  No new issues to report.  Has nausea medication at home and knows to call if any new symptoms develop

## 2014-01-22 ENCOUNTER — Encounter: Payer: Self-pay | Admitting: Oncology

## 2014-01-22 ENCOUNTER — Other Ambulatory Visit (HOSPITAL_BASED_OUTPATIENT_CLINIC_OR_DEPARTMENT_OTHER): Payer: Medicare Other

## 2014-01-22 ENCOUNTER — Ambulatory Visit (HOSPITAL_BASED_OUTPATIENT_CLINIC_OR_DEPARTMENT_OTHER): Payer: Medicare Other | Admitting: Oncology

## 2014-01-22 VITALS — BP 113/74 | HR 100 | Temp 97.5°F | Resp 18 | Ht 62.0 in | Wt 111.2 lb

## 2014-01-22 DIAGNOSIS — C3492 Malignant neoplasm of unspecified part of left bronchus or lung: Secondary | ICD-10-CM

## 2014-01-22 DIAGNOSIS — C787 Secondary malignant neoplasm of liver and intrahepatic bile duct: Secondary | ICD-10-CM

## 2014-01-22 DIAGNOSIS — C3402 Malignant neoplasm of left main bronchus: Secondary | ICD-10-CM

## 2014-01-22 LAB — COMPREHENSIVE METABOLIC PANEL (CC13)
ALBUMIN: 3.6 g/dL (ref 3.5–5.0)
ALT: 51 U/L (ref 0–55)
ANION GAP: 10 meq/L (ref 3–11)
AST: 34 U/L (ref 5–34)
Alkaline Phosphatase: 115 U/L (ref 40–150)
BILIRUBIN TOTAL: 0.41 mg/dL (ref 0.20–1.20)
BUN: 27 mg/dL — ABNORMAL HIGH (ref 7.0–26.0)
CALCIUM: 9 mg/dL (ref 8.4–10.4)
CO2: 28 meq/L (ref 22–29)
Chloride: 99 mEq/L (ref 98–109)
Creatinine: 1.2 mg/dL — ABNORMAL HIGH (ref 0.6–1.1)
EGFR: 43 mL/min/{1.73_m2} — AB (ref >90)
GLUCOSE: 122 mg/dL (ref 70–140)
POTASSIUM: 4.1 meq/L (ref 3.5–5.1)
SODIUM: 136 meq/L (ref 136–145)
TOTAL PROTEIN: 6.5 g/dL (ref 6.4–8.3)

## 2014-01-22 LAB — CBC WITH DIFFERENTIAL/PLATELET
BASO%: 0.5 % (ref 0.0–2.0)
Basophils Absolute: 0 10*3/uL (ref 0.0–0.1)
EOS%: 0.3 % (ref 0.0–7.0)
Eosinophils Absolute: 0 10*3/uL (ref 0.0–0.5)
HEMATOCRIT: 34.1 % — AB (ref 34.8–46.6)
HEMOGLOBIN: 11.3 g/dL — AB (ref 11.6–15.9)
LYMPH#: 0.6 10*3/uL — AB (ref 0.9–3.3)
LYMPH%: 7.9 % — ABNORMAL LOW (ref 14.0–49.7)
MCH: 31 pg (ref 25.1–34.0)
MCHC: 33.2 g/dL (ref 31.5–36.0)
MCV: 93.3 fL (ref 79.5–101.0)
MONO#: 0.1 10*3/uL (ref 0.1–0.9)
MONO%: 1.9 % (ref 0.0–14.0)
NEUT#: 6.2 10*3/uL (ref 1.5–6.5)
NEUT%: 89.4 % — AB (ref 38.4–76.8)
Platelets: 137 10*3/uL — ABNORMAL LOW (ref 145–400)
RBC: 3.65 10*6/uL — ABNORMAL LOW (ref 3.70–5.45)
RDW: 12.8 % (ref 11.2–14.5)
WBC: 6.9 10*3/uL (ref 3.9–10.3)

## 2014-01-22 NOTE — Progress Notes (Signed)
[place] [phone]   [phone]  OFFICE PROGRESS NOTE   DIAGNOSIS AND STAGE: Extensive stage small cell lung cancer with large obstructing Center left lung mass with large left pleural effusion, liver and brain metastasis diagnosed in June of 2014   PRIOR THERAPY:  1) Status post whole brain irradiation under the care of Dr. Isidore Moos completed 09/11/2012.  2) whole brain irradiation under the care of Dr. Isidore Moos expected to be completed on 09/11/2012.  3) Systemic chemotherapy with carboplatin for AUC of 5 on day 1 and etoposide 120 mg/M2 on days 1, 2 and 3 with Neulasta support on day 4, status post 4 cycles, last cycle was given on 10/11/2012 with partial response.  4) Systemic chemotherapy with cisplatin 30 mg/M2 and irinotecan 65 mg/M2 on days 1 and 8 every 3 weeks, status post 9 cycles. First dose 01/16/2013. Last dose was given 10/18/2013. 5) Temodar 150 mg/M2 on days 1-5 every 4 weeks. She started the first dose of her treatment 11/14/2013. Status post 2 months of treatment discontinued today secondary to disease progression  CURRENT THERAPY:  Systemic chemotherapy again with carboplatin for AUC of 5 on day 1 and etoposide 100 MG/M2 on days 1, 2 and 3 with Neulasta support on day 4. First cycle started on 01/15/2014.  CHEMOTHERAPY INTENT: Palliative  CURRENT # OF CHEMOTHERAPY CYCLES: 1 CURRENT ANTIEMETICS: Zofran, dexamethasone and Compazine  CURRENT SMOKING STATUS: Former smoker  ORAL CHEMOTHERAPY AND CONSENT: Temodar 250 mg for 5 days every 4 weeks. First dose 11/14/2013. CURRENT BISPHOSPHONATES USE: None  PAIN MANAGEMENT: 0/10 on Vicodin  NARCOTICS INDUCED CONSTIPATION: None  LIVING WILL AND CODE STATUS: no CODE BLUE   INTERVAL HISTORY: Cheryl Nielsen 74 y.o. female returns to the clinic today for follow up visit accompanied by her daughter Cheryl Nielsen. She is here for symptom management visit. She tolerated her first cycle of carboplatin  and etoposide well. She is feeling fine today. Reports mild fatigue. She denied having any significant fever or chills. She denied having any significant chest pain, shortness of breath, cough or hemoptysis. She has no weight loss or night sweats.   MEDICAL HISTORY: Past Medical History  Diagnosis Date  . Pneumonia, organism unspecified   . Status post chemotherapy  10/11/2012     carboplatin  . S/P radiation therapy 08/24/2012-09/11/2012    Whole Brain  / 32.5 Gy in 13 fractions  . 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 S/P radiation therapy  07/24/2012-08/10/2012      Mediastinum and Left [place] / 35 Gy in 14 fractions      ALLERGIES:  is allergic to asa; codeine; and tramadol.  MEDICATIONS:  Current Outpatient Prescriptions  Medication Sig Dispense Refill  . acetaminophen (TYLENOL) 500 MG tablet Take 1,000 mg by mouth every 6 (six) hours as needed (For headache.).    Marland Kitchen diphenoxylate-atropine (LOMOTIL) 2.5-0.025 MG per tablet Take 1 tablet by mouth 2 (two) times daily as needed for diarrhea or loose stools. 60 tablet 1  . glucosamine-chondroitin 500-400 MG tablet Take 1 tablet by mouth daily with lunch.     . lidocaine-prilocaine (EMLA) cream Apply 1 application topically daily as needed (Applies to port-a-cath.). 30 g 0  . mirabegron ER (MYRBETRIQ) 25 MG TB24 tablet Take 25 mg by mouth daily.    . Multiple Vitamin (MULTIVITAMIN WITH MINERALS) TABS Take 1 tablet by mouth daily with lunch. She takes Dance movement psychotherapist Adult  50+.    . Omega-3 Fatty Acids (FISH OIL CONCENTRATE PO) Take by mouth. Pt unsure of dose, takes daily    . ondansetron (ZOFRAN) 8 MG tablet Take 1 tablet (8 mg total) by mouth every 8 (eight) hours as needed for nausea or vomiting. 20 tablet 0  . oxyCODONE-acetaminophen (PERCOCET/ROXICET) 5-325 MG per tablet Take 1 tablet by mouth every 6 (six) hours as needed for severe pain. 40 tablet 0  . polyethylene glycol (MIRALAX / GLYCOLAX) packet Take  17 g by mouth daily as needed.    . prochlorperazine (COMPAZINE) 10 MG tablet Take 1 tablet (10 mg total) by mouth every 6 (six) hours as needed for nausea or vomiting. 30 tablet 0  . senna (SENOKOT) 8.6 MG TABS tablet Take 1 tablet by mouth daily as needed for mild constipation.    . sulfamethoxazole-trimethoprim (BACTRIM DS,SEPTRA DS) 800-160 MG per tablet Take 1 tablet by mouth twice a day on Mondays and Thursdays (Patient not taking: Reported on 01/22/2014) 30 tablet 1  . temozolomide (TEMODAR) 250 MG capsule Take 1 capsule (250 mg total) by mouth daily for 5 days every 4 weeks. (Patient not taking: Reported on 01/22/2014) 5 capsule 2   No current facility-administered medications for this visit.   Facility-Administered Medications Ordered in Other Visits  Medication Dose Route Frequency Provider Last Rate Last Dose  . prochlorperazine (COMPAZINE) tablet 10 mg  10 mg Oral Once Curt Bears, MD      . sodium chloride 0.9 % injection 10 mL  10 mL Intracatheter PRN Curt Bears, MD   10 mL at 01/17/14 1726    SURGICAL HISTORY:  Past Surgical History  Procedure Laterality Date  . Nasal sinus surgery    . Video bronchoscopy Bilateral 07/05/2012    Procedure: VIDEO BRONCHOSCOPY WITHOUT FLUORO;  Surgeon: Tanda Rockers, MD;  Location: [facility];  Service: Cardiopulmonary;  Laterality: Bilateral;  . Portacath placement      REVIEW OF SYSTEMS:  Constitutional: positive for fatigue Eyes: negative Ears, nose, mouth, throat, and face: negative Respiratory: positive for dyspnea on exertion Cardiovascular: negative Gastrointestinal: negative Genitourinary:negative Integument/breast: negative Hematologic/lymphatic: negative Musculoskeletal:negative Neurological: negative Behavioral/Psych: negative Endocrine: negative Allergic/Immunologic: negative   PHYSICAL EXAMINATION: General appearance: alert, cooperative, fatigued and no distress Head: Normocephalic, without obvious abnormality,  atraumatic Neck: no adenopathy, no JVD, supple, symmetrical, trachea midline and thyroid not enlarged, symmetric, no tenderness/mass/nodules Lymph nodes: Cervical, supraclavicular, and axillary nodes normal. Resp: clear to auscultation bilaterally Back: symmetric, no curvature. ROM normal. No CVA tenderness. Cardio: regular rate and rhythm, S1, S2 normal, no murmur, click, rub or gallop GI: soft, non-tender; bowel sounds normal; no masses,  no organomegaly Extremities: extremities normal, atraumatic, no cyanosis or edema Neurologic: Alert and oriented X 3, normal strength and tone. Normal symmetric reflexes. Normal coordination and gait  ECOG PERFORMANCE STATUS: 1 - Symptomatic but completely ambulatory  Blood pressure 113/74, pulse 100, temperature 97.5 F (36.4 C), temperature source Oral, resp. rate 18, height 5\' 2"  (1.575 m), weight 111 lb 3.2 oz (50.44 kg), SpO2 100 %.  LABORATORY DATA: Lab Results  Component Value Date   WBC 6.9 01/22/2014   HGB 11.3* 01/22/2014   HCT 34.1* 01/22/2014   MCV 93.3 01/22/2014   PLT 137* 01/22/2014      Chemistry      Component Value Date/Time   NA 136 01/22/2014 1434   NA 131* 10/04/2013 1347   K 4.1 01/22/2014 1434   K 4.7 10/04/2013 1347   CL  97 10/04/2013 1347   CL 93* 07/10/2012 1544   CO2 28 01/22/2014 1434   CO2 26 10/04/2013 1347   BUN 27.0* 01/22/2014 1434   BUN 20 10/04/2013 1347   CREATININE 1.2* 01/22/2014 1434   CREATININE 1.04 10/04/2013 1347      Component Value Date/Time   CALCIUM 9.0 01/22/2014 1434   CALCIUM 8.9 10/04/2013 1347   ALKPHOS 115 01/22/2014 1434   ALKPHOS 84 10/04/2013 1347   AST 34 01/22/2014 1434   AST 19 10/04/2013 1347   ALT 51 01/22/2014 1434   ALT 17 10/04/2013 1347   BILITOT 0.41 01/22/2014 1434   BILITOT 0.2 10/04/2013 1347       RADIOGRAPHIC STUDIES:  Ct Abdomen Pelvis Wo Contrast  01/04/2014   CLINICAL DATA:  Followup metastatic left small cell lung carcinoma. Shortness of breath.  Ongoing chemotherapy. Previous radiation delivery. Restaging.  EXAM: CT CHEST, ABDOMEN AND PELVIS WITHOUT CONTRAST  TECHNIQUE: Multidetector CT imaging of the chest, abdomen and pelvis was performed following the standard protocol without IV contrast.  COMPARISON:  10/26/2013  FINDINGS: CT CHEST FINDINGS  Mediastinum/Hilar Regions: Increased superior mediastinal lymphadenopathy is seen encasing the great vessels. This measures 2.3 cm in short axis on image 13/series 2 compared to 1.6 cm previously.  Other Thoracic Lymphadenopathy: Mild increase in bilateral supraclavicular/ low jugular lymphadenopathy also noted. No axillary lymphadenopathy identified.  Lungs: Post treatment changes in the left upper lobe again noted. Several ill-defined masslike areas opacity in the peripheral left upper lobe show increased prominence since previous study. Largest currently measures 1.9 cm on image 23/series for and is new since previous study. This is consistent with increased tumor. A nodular density in the lingula on image 28/series for is also increased, currently measuring 0.8 x 1.2 cm compared to 0.6 x 1.0 cm previously. Tiny sub-cm pulmonary nodules in the left lower lobe show no significant change.  Pleura:  No evidence of effusion or mass.  Vascular/Cardiac:  No acute findings identified.  Other:  None.  Musculoskeletal: Small sclerotic bone metastases in the thoracic spine show no significant change.  CT ABDOMEN AND PELVIS FINDINGS  Hepatobiliary: Few tiny hepatic cysts again noted. A low attenuation mass in the central right hepatic lobe has increased in size since previous study, currently measuring 5.4 x 6.4 cm on image 63/series 2 compared to 1.9 x 2.4 cm previously.  Pancreas: No mass or inflammatory process visualized on this non-contrast exam.  Spleen:  Within normal limits in size.  Adrenal Glands: Right adrenal mass has increased in size, currently measuring 2.4 x 2.7 cm on image 53 compared to 1.3 x 2.4 cm  previously. Left adrenal mass also shows mild increase in size, currently measuring 1.6 x 2.1 cm compared to 1.3 x 1.5 cm previously.  Kidneys/Urinary tract: No evidence of urolithiasis or hydronephrosis.  Stomach/Bowel/Peritoneum:  Unremarkable.  Vascular/Lymphatic: No pathologically enlarged lymph nodes identified. Sub-cm right retrocrural lymph nodes are stable. No other significant abnormality identified.  Reproductive:  No mass or other significant abnormality noted.  Other:  None.  Musculoskeletal: Sclerotic bone metastases in the spine, pelvis, and right hip show no significant change.  IMPRESSION: Increased superior mediastinal and low cervical lymphadenopathy.  Increased ill-defined masslike opacities in the left upper lobe and lingula, consistent with carcinoma.  Increased size of right hepatic lobe metastasis.  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  Stable appearance of sclerotic bone metastases.   Electronically Signed   By: Sharrie Rothman.D.  On: 01/04/2014 16:19   Ct Chest Wo Contrast  01/04/2014   CLINICAL DATA:  Followup metastatic left small cell lung carcinoma. Shortness of breath. Ongoing chemotherapy. Previous radiation delivery. Restaging.  EXAM: CT CHEST, ABDOMEN AND PELVIS WITHOUT CONTRAST  TECHNIQUE: Multidetector CT imaging of the chest, abdomen and pelvis was performed following the standard protocol without IV contrast.  COMPARISON:  10/26/2013  FINDINGS: CT CHEST FINDINGS  Mediastinum/Hilar Regions: Increased superior mediastinal lymphadenopathy is seen encasing the great vessels. This measures 2.3 cm in short axis on image 13/series 2 compared to 1.6 cm previously.  Other Thoracic Lymphadenopathy: Mild increase in bilateral supraclavicular/ low jugular lymphadenopathy also noted. No axillary lymphadenopathy identified.  Lungs: Post treatment changes in the left upper lobe again noted. Several ill-defined masslike areas opacity in the peripheral left upper lobe show  increased prominence since previous study. Largest currently measures 1.9 cm on image 23/series for and is new since previous study. This is consistent with increased tumor. A nodular density in the lingula on image 28/series for is also increased, currently measuring 0.8 x 1.2 cm compared to 0.6 x 1.0 cm previously. Tiny sub-cm pulmonary nodules in the left lower lobe show no significant change.  Pleura:  No evidence of effusion or mass.  Vascular/Cardiac:  No acute findings identified.  Other:  None.  Musculoskeletal: Small sclerotic bone metastases in the thoracic spine show no significant change.  CT ABDOMEN AND PELVIS FINDINGS  Hepatobiliary: Few tiny hepatic cysts again noted. A low attenuation mass in the central right hepatic lobe has increased in size since previous study, currently measuring 5.4 x 6.4 cm on image 63/series 2 compared to 1.9 x 2.4 cm previously.  Pancreas: No mass or inflammatory process visualized on this non-contrast exam.  Spleen:  Within normal limits in size.  Adrenal Glands: Right adrenal mass has increased in size, currently measuring 2.4 x 2.7 cm on image 53 compared to 1.3 x 2.4 cm previously. Left adrenal mass also shows mild increase in size, currently measuring 1.6 x 2.1 cm compared to 1.3 x 1.5 cm previously.  Kidneys/Urinary tract: No evidence of urolithiasis or hydronephrosis.  Stomach/Bowel/Peritoneum:  Unremarkable.  Vascular/Lymphatic: No pathologically enlarged lymph nodes identified. Sub-cm right retrocrural lymph nodes are stable. No other significant abnormality identified.  Reproductive:  No mass or other significant abnormality noted.  Other:  None.  Musculoskeletal: Sclerotic bone metastases in the spine, pelvis, and right hip show no significant change.  IMPRESSION: Increased superior mediastinal and low cervical lymphadenopathy.  Increased ill-defined masslike opacities in the left upper lobe and lingula, consistent with carcinoma.  Increased size of right hepatic  lobe metastasis.  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  Stable appearance of sclerotic bone metastases.   Electronically Signed   By: Earle Gell M.D.   On: 01/04/2014 16:19    ASSESSMENT AND PLAN: this is a very pleasant 74 years old white female with extensive stage small cell lung cancer status post 4 cycles of systemic chemotherapy with carboplatin and etoposide with significant improvement in her disease. She was on observation for few months and the patient had evidence for disease progression on restaging scan. She was started on second line chemotherapy with cisplatin and irinotecan status post 9 cycles. This was discontinued secondary to disease progression. She was also treated with 2 cycles of oral Temodar but discontinued secondary to disease progression. CT scan showed evidence for disease progression in the lung as well as the liver and the bilateral adrenal  metastasis.  The patient is now status post one cycle of systemic chemotherapy with carboplatin and etoposide. She tolerated it quite well with the exception of mild fatigue. She will continue to have weekly labs. We will plan to bring her back on January 19 to begin her second cycle of chemotherapy.  She was advised to call immediately if she has any concerning symptoms in the interval.  The patient voices understanding of current disease status and treatment options and is in agreement with the current care plan.  The patient was seen and examined with Dr. Julien Nordmann.  Mikey Bussing, DNP, AGPCNP-BC, AOCNP   ADDENDUM: Hematology/Oncology Attending: I had a face to face encounter with the patient. I recommended her care plan. This is a very pleasant 74 years old white female with extensive stage small cell lung cancer diagnosed in June 2014 status post several chemotherapy regimens. She had evidence for disease progression and the patient was restarted again on systemic chemotherapy with carboplatin and  etoposide. She noticed improvement and the chest pain after starting the first cycle of her treatment. She denied having any significant complaints and tolerating her systemic chemotherapy fairly well. She denied having any nausea or vomiting, no fever or chills. I recommended for the patient to continue with her weekly blood work and she would come back for follow-up visit in 2 weeks for reevaluation before starting cycle #2. She was advised to call immediately if she has any concerning symptoms in the interval.  Disclaimer: This note was dictated with voice recognition software. Similar sounding words can inadvertently be transcribed and may be missed upon review. Eilleen Kempf., MD 01/22/2014

## 2014-01-24 ENCOUNTER — Encounter: Payer: Self-pay | Admitting: *Deleted

## 2014-01-24 NOTE — Progress Notes (Signed)
[place] Social Work  Clinical Social Work was referred by patient's daughter to review and complete healthcare advance directives.  Clinical Social Worker met with patient and daughter in [place] office.  The patient designated daughter Marliss Czar "[name]" Sherrell Puller as their primary healthcare agent and son Gardiner Ramus as their secondary agent.  Patient also completed healthcare living will.    Clinical Social Worker notarized documents and made copies for patient/family. Clinical Social Worker will send documents to medical records to be scanned into patient's chart. Clinical Social Worker encouraged patient/family to contact with any additional questions or concerns.  Polo Riley, MSW, [place] Worker [facility] [phone]

## 2014-01-25 ENCOUNTER — Other Ambulatory Visit: Payer: Self-pay | Admitting: *Deleted

## 2014-01-25 ENCOUNTER — Telehealth: Payer: Self-pay | Admitting: *Deleted

## 2014-01-25 DIAGNOSIS — C3492 Malignant neoplasm of unspecified part of left bronchus or lung: Secondary | ICD-10-CM

## 2014-01-25 MED ORDER — OXYCODONE-ACETAMINOPHEN 5-325 MG PO TABS: 1.0000 | ORAL_TABLET | Freq: Four times a day (QID) | ORAL | Status: DC | PRN

## 2014-01-25 NOTE — Telephone Encounter (Signed)
Per staff message and POF I have scheduled appts. Advised scheduler of appts. JMW  

## 2014-01-29 ENCOUNTER — Other Ambulatory Visit (HOSPITAL_BASED_OUTPATIENT_CLINIC_OR_DEPARTMENT_OTHER): Payer: Medicare Other

## 2014-01-29 DIAGNOSIS — C3492 Malignant neoplasm of unspecified part of left bronchus or lung: Secondary | ICD-10-CM

## 2014-01-29 LAB — COMPREHENSIVE METABOLIC PANEL (CC13)
ALBUMIN: 3.8 g/dL (ref 3.5–5.0)
ALT: 32 U/L (ref 0–55)
ANION GAP: 12 meq/L — AB (ref 3–11)
AST: 31 U/L (ref 5–34)
Alkaline Phosphatase: 131 U/L (ref 40–150)
BUN: 15.2 mg/dL (ref 7.0–26.0)
CALCIUM: 9.1 mg/dL (ref 8.4–10.4)
CHLORIDE: 99 meq/L (ref 98–109)
CO2: 26 meq/L (ref 22–29)
CREATININE: 1.3 mg/dL — AB (ref 0.6–1.1)
EGFR: 42 mL/min/{1.73_m2} — ABNORMAL LOW (ref >90)
Glucose: 122 mg/dl (ref 70–140)
POTASSIUM: 4.2 meq/L (ref 3.5–5.1)
Sodium: 138 mEq/L (ref 136–145)
Total Bilirubin: <0.2 mg/dL (ref 0.20–1.20)
Total Protein: 6.7 g/dL (ref 6.4–8.3)

## 2014-01-29 LAB — CBC WITH DIFFERENTIAL/PLATELET
BASO%: 0.4 % (ref 0.0–2.0)
BASOS ABS: 0 10*3/uL (ref 0.0–0.1)
EOS%: 0.2 % (ref 0.0–7.0)
Eosinophils Absolute: 0 10*3/uL (ref 0.0–0.5)
HCT: 36.1 % (ref 34.8–46.6)
HEMOGLOBIN: 11.7 g/dL (ref 11.6–15.9)
LYMPH%: 8.5 % — ABNORMAL LOW (ref 14.0–49.7)
MCH: 30.1 pg (ref 25.1–34.0)
MCHC: 32.3 g/dL (ref 31.5–36.0)
MCV: 93.2 fL (ref 79.5–101.0)
MONO#: 0.7 10*3/uL (ref 0.1–0.9)
MONO%: 7.1 % (ref 0.0–14.0)
NEUT%: 83.8 % — ABNORMAL HIGH (ref 38.4–76.8)
NEUTROS ABS: 7.9 10*3/uL — AB (ref 1.5–6.5)
PLATELETS: 117 10*3/uL — AB (ref 145–400)
RBC: 3.87 10*6/uL (ref 3.70–5.45)
RDW: 13.2 % (ref 11.2–14.5)
WBC: 9.4 10*3/uL (ref 3.9–10.3)
lymph#: 0.8 10*3/uL — ABNORMAL LOW (ref 0.9–3.3)

## 2014-02-05 ENCOUNTER — Other Ambulatory Visit (HOSPITAL_BASED_OUTPATIENT_CLINIC_OR_DEPARTMENT_OTHER): Payer: Medicare Other

## 2014-02-05 ENCOUNTER — Ambulatory Visit (HOSPITAL_BASED_OUTPATIENT_CLINIC_OR_DEPARTMENT_OTHER): Payer: Medicare Other

## 2014-02-05 ENCOUNTER — Other Ambulatory Visit: Payer: Self-pay | Admitting: Internal Medicine

## 2014-02-05 DIAGNOSIS — C3492 Malignant neoplasm of unspecified part of left bronchus or lung: Secondary | ICD-10-CM

## 2014-02-05 DIAGNOSIS — Z5111 Encounter for antineoplastic chemotherapy: Secondary | ICD-10-CM

## 2014-02-05 DIAGNOSIS — C3402 Malignant neoplasm of left main bronchus: Secondary | ICD-10-CM

## 2014-02-05 LAB — COMPREHENSIVE METABOLIC PANEL (CC13)
ALT: 23 U/L (ref 0–55)
ANION GAP: 10 meq/L (ref 3–11)
AST: 29 U/L (ref 5–34)
Albumin: 3.8 g/dL (ref 3.5–5.0)
Alkaline Phosphatase: 129 U/L (ref 40–150)
BUN: 19.9 mg/dL (ref 7.0–26.0)
CALCIUM: 9.5 mg/dL (ref 8.4–10.4)
CO2: 28 meq/L (ref 22–29)
Chloride: 97 mEq/L — ABNORMAL LOW (ref 98–109)
Creatinine: 1.3 mg/dL — ABNORMAL HIGH (ref 0.6–1.1)
EGFR: 39 mL/min/{1.73_m2} — ABNORMAL LOW (ref >90)
Glucose: 121 mg/dl (ref 70–140)
POTASSIUM: 4.6 meq/L (ref 3.5–5.1)
Sodium: 134 mEq/L — ABNORMAL LOW (ref 136–145)
TOTAL PROTEIN: 7.1 g/dL (ref 6.4–8.3)
Total Bilirubin: 0.2 mg/dL (ref 0.20–1.20)

## 2014-02-05 LAB — CBC WITH DIFFERENTIAL/PLATELET
BASO%: 0.5 % (ref 0.0–2.0)
Basophils Absolute: 0.1 10*3/uL (ref 0.0–0.1)
EOS ABS: 0 10*3/uL (ref 0.0–0.5)
EOS%: 0.2 % (ref 0.0–7.0)
HEMATOCRIT: 39.1 % (ref 34.8–46.6)
HGB: 12.5 g/dL (ref 11.6–15.9)
LYMPH%: 6.6 % — ABNORMAL LOW (ref 14.0–49.7)
MCH: 29.9 pg (ref 25.1–34.0)
MCHC: 32.1 g/dL (ref 31.5–36.0)
MCV: 93.2 fL (ref 79.5–101.0)
MONO#: 0.9 10*3/uL (ref 0.1–0.9)
MONO%: 9.6 % (ref 0.0–14.0)
NEUT#: 8.1 10*3/uL — ABNORMAL HIGH (ref 1.5–6.5)
NEUT%: 83.1 % — AB (ref 38.4–76.8)
Platelets: 263 10*3/uL (ref 145–400)
RBC: 4.19 10*6/uL (ref 3.70–5.45)
RDW: 13.4 % (ref 11.2–14.5)
WBC: 9.7 10*3/uL (ref 3.9–10.3)
lymph#: 0.6 10*3/uL — ABNORMAL LOW (ref 0.9–3.3)

## 2014-02-05 MED ORDER — DEXAMETHASONE SODIUM PHOSPHATE 20 MG/5ML IJ SOLN
20.0000 mg | Freq: Once | INTRAMUSCULAR | Status: AC
  Administered 2014-02-05: 20 mg via INTRAVENOUS

## 2014-02-05 MED ORDER — ONDANSETRON 16 MG/50ML IVPB (CHCC)
16.0000 mg | Freq: Once | INTRAVENOUS | Status: AC
  Administered 2014-02-05: 16 mg via INTRAVENOUS

## 2014-02-05 MED ORDER — ONDANSETRON 16 MG/50ML IVPB (CHCC)
INTRAVENOUS | Status: AC
  Filled 2014-02-05: qty 16

## 2014-02-05 MED ORDER — SODIUM CHLORIDE 0.9 % IV SOLN
Freq: Once | INTRAVENOUS | Status: AC
  Administered 2014-02-05: 15:00:00 via INTRAVENOUS

## 2014-02-05 MED ORDER — CARBOPLATIN CHEMO INJECTION 450 MG/45ML
276.0000 mg | Freq: Once | INTRAVENOUS | Status: AC
  Administered 2014-02-05: 280 mg via INTRAVENOUS
  Filled 2014-02-05: qty 28

## 2014-02-05 MED ORDER — SODIUM CHLORIDE 0.9 % IV SOLN
100.0000 mg/m2 | Freq: Once | INTRAVENOUS | Status: AC
  Administered 2014-02-05: 150 mg via INTRAVENOUS
  Filled 2014-02-05: qty 7.5

## 2014-02-05 MED ORDER — DEXAMETHASONE SODIUM PHOSPHATE 20 MG/5ML IJ SOLN
INTRAMUSCULAR | Status: AC
  Filled 2014-02-05: qty 5

## 2014-02-05 MED ORDER — SODIUM CHLORIDE 0.9 % IJ SOLN
10.0000 mL | INTRAMUSCULAR | Status: DC | PRN
  Administered 2014-02-05: 10 mL
  Filled 2014-02-05: qty 10

## 2014-02-05 MED ORDER — HEPARIN SOD (PORK) LOCK FLUSH 100 UNIT/ML IV SOLN
500.0000 [IU] | Freq: Once | INTRAVENOUS | Status: AC | PRN
  Administered 2014-02-05: 500 [IU]
  Filled 2014-02-05: qty 5

## 2014-02-05 NOTE — Patient Instructions (Signed)
[place] Discharge Instructions for Patients Receiving Chemotherapy  Today you received the following chemotherapy agents carboplatin/etoposide  To help prevent nausea and vomiting after your treatment, we encourage you to take your nausea medication as directed   If you develop nausea and vomiting that is not controlled by your nausea medication, call the clinic.   BELOW ARE SYMPTOMS THAT SHOULD BE REPORTED IMMEDIATELY:  *FEVER GREATER THAN 100.5 F  *CHILLS WITH OR WITHOUT FEVER  NAUSEA AND VOMITING THAT IS NOT CONTROLLED WITH YOUR NAUSEA MEDICATION  *UNUSUAL SHORTNESS OF BREATH  *UNUSUAL BRUISING OR BLEEDING  TENDERNESS IN MOUTH AND THROAT WITH OR WITHOUT PRESENCE OF ULCERS  *URINARY PROBLEMS  *BOWEL PROBLEMS  UNUSUAL RASH Items with * indicate a potential emergency and should be followed up as soon as possible.  Feel free to call the clinic you have any questions or concerns. The clinic phone number is [phone].

## 2014-02-06 ENCOUNTER — Other Ambulatory Visit: Payer: Medicare Other

## 2014-02-06 ENCOUNTER — Ambulatory Visit (HOSPITAL_BASED_OUTPATIENT_CLINIC_OR_DEPARTMENT_OTHER): Payer: Medicare Other

## 2014-02-06 ENCOUNTER — Telehealth: Payer: Self-pay | Admitting: Physician Assistant

## 2014-02-06 ENCOUNTER — Encounter: Payer: Self-pay | Admitting: Physician Assistant

## 2014-02-06 ENCOUNTER — Ambulatory Visit (HOSPITAL_BASED_OUTPATIENT_CLINIC_OR_DEPARTMENT_OTHER): Payer: Medicare Other | Admitting: Physician Assistant

## 2014-02-06 VITALS — BP 127/71 | HR 107 | Temp 98.0°F | Resp 18 | Ht 62.0 in | Wt 111.4 lb

## 2014-02-06 DIAGNOSIS — C787 Secondary malignant neoplasm of liver and intrahepatic bile duct: Secondary | ICD-10-CM

## 2014-02-06 DIAGNOSIS — C7931 Secondary malignant neoplasm of brain: Secondary | ICD-10-CM

## 2014-02-06 DIAGNOSIS — C3492 Malignant neoplasm of unspecified part of left bronchus or lung: Secondary | ICD-10-CM

## 2014-02-06 DIAGNOSIS — C3402 Malignant neoplasm of left main bronchus: Secondary | ICD-10-CM | POA: Diagnosis not present

## 2014-02-06 DIAGNOSIS — Z5111 Encounter for antineoplastic chemotherapy: Secondary | ICD-10-CM

## 2014-02-06 MED ORDER — HEPARIN SOD (PORK) LOCK FLUSH 100 UNIT/ML IV SOLN
500.0000 [IU] | Freq: Once | INTRAVENOUS | Status: DC | PRN
  Filled 2014-02-06: qty 5

## 2014-02-06 MED ORDER — PROCHLORPERAZINE MALEATE 10 MG PO TABS
ORAL_TABLET | ORAL | Status: AC
  Filled 2014-02-06: qty 1

## 2014-02-06 MED ORDER — PROCHLORPERAZINE MALEATE 10 MG PO TABS
10.0000 mg | ORAL_TABLET | Freq: Once | ORAL | Status: AC
  Administered 2014-02-06: 10 mg via ORAL

## 2014-02-06 MED ORDER — SODIUM CHLORIDE 0.9 % IV SOLN
100.0000 mg/m2 | Freq: Once | INTRAVENOUS | Status: AC
  Administered 2014-02-06: 150 mg via INTRAVENOUS
  Filled 2014-02-06: qty 7.5

## 2014-02-06 MED ORDER — SODIUM CHLORIDE 0.9 % IV SOLN
Freq: Once | INTRAVENOUS | Status: AC
  Administered 2014-02-06: 15:00:00 via INTRAVENOUS

## 2014-02-06 MED ORDER — SODIUM CHLORIDE 0.9 % IJ SOLN
10.0000 mL | INTRAMUSCULAR | Status: DC | PRN
  Filled 2014-02-06: qty 10

## 2014-02-06 NOTE — Patient Instructions (Signed)
[facility] [facility] Discharge Instructions for Patients Receiving Chemotherapy  Today you received the following chemotherapy agents Etopside  To help prevent nausea and vomiting after your treatment, we encourage you to take your nausea medication as directed.   If you develop nausea and vomiting that is not controlled by your nausea medication, call the clinic.   BELOW ARE SYMPTOMS THAT SHOULD BE REPORTED IMMEDIATELY:  *FEVER GREATER THAN 100.5 F  *CHILLS WITH OR WITHOUT FEVER  NAUSEA AND VOMITING THAT IS NOT CONTROLLED WITH YOUR NAUSEA MEDICATION  *UNUSUAL SHORTNESS OF BREATH  *UNUSUAL BRUISING OR BLEEDING  TENDERNESS IN MOUTH AND THROAT WITH OR WITHOUT PRESENCE OF ULCERS  *URINARY PROBLEMS  *BOWEL PROBLEMS  UNUSUAL RASH Items with * indicate a potential emergency and should be followed up as soon as possible.  Feel free to call the clinic you have any questions or concerns. The clinic phone number is [phone].

## 2014-02-06 NOTE — Progress Notes (Addendum)
[place] [phone]   [phone]  OFFICE PROGRESS NOTE   DIAGNOSIS AND STAGE: Extensive stage small cell lung cancer with large obstructing Center left lung mass with large left pleural effusion, liver and brain metastasis diagnosed in June of 2014   PRIOR THERAPY:  1) Status post whole brain irradiation under the care of Dr. Isidore Moos completed 09/11/2012.  2) whole brain irradiation under the care of Dr. Isidore Moos expected to be completed on 09/11/2012.  3) Systemic chemotherapy with carboplatin for AUC of 5 on day 1 and etoposide 120 mg/M2 on days 1, 2 and 3 with Neulasta support on day 4, status post 4 cycles, last cycle was given on 10/11/2012 with partial response.  4) Systemic chemotherapy with cisplatin 30 mg/M2 and irinotecan 65 mg/M2 on days 1 and 8 every 3 weeks, status post 9 cycles. First dose 01/16/2013. Last dose was given 10/18/2013. 5) Temodar 150 mg/M2 on days 1-5 every 4 weeks. She started the first dose of her treatment 11/14/2013. Status post 2 months of treatment discontinued today secondary to disease progression  CURRENT THERAPY:  Systemic chemotherapy again with carboplatin for AUC of 5 on day 1 and etoposide 100 MG/M2 on days 1, 2 and 3 with Neulasta support on day 4. First cycle started on 01/15/2014. Status post 1 cycle  CHEMOTHERAPY INTENT: Palliative  CURRENT # OF CHEMOTHERAPY CYCLES: 2 CURRENT ANTIEMETICS: Zofran, dexamethasone and Compazine  CURRENT SMOKING STATUS: Former smoker  ORAL CHEMOTHERAPY AND CONSENT: Temodar 250 mg for 5 days every 4 weeks. First dose 11/14/2013. CURRENT BISPHOSPHONATES USE: None  PAIN MANAGEMENT: 0/10 on Vicodin  NARCOTICS INDUCED CONSTIPATION: None  LIVING WILL AND CODE STATUS: no CODE BLUE   INTERVAL HISTORY: Cheryl Nielsen 74 y.o. female returns to the clinic today for follow up visit accompanied by her friend.  She tolerated her first cycle of carboplatin and etoposide well. She did  have some problems with nausea and vomiting but they were managed with her current antiemetic medication. She is feeling fine today. Reports mild fatigue. She denied having any significant fever or chills. She denied having any significant chest pain, shortness of breath, cough or hemoptysis. She has no weight loss or night sweats. She presents to proceed with cycle #2  MEDICAL HISTORY: Past Medical History  Diagnosis Date  . Pneumonia, organism unspecified   . Status post chemotherapy  10/11/2012     carboplatin  . S/P radiation therapy 08/24/2012-09/11/2012    Whole Brain  / 32.5 Gy in 13 fractions  . 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 S/P radiation therapy  07/24/2012-08/10/2012      Mediastinum and Left Hilum / 35 Gy in 14 fractions      ALLERGIES:  is allergic to asa; codeine; and tramadol.  MEDICATIONS:  Current Outpatient Prescriptions  Medication Sig Dispense Refill  . acetaminophen (TYLENOL) 500 MG tablet Take 1,000 mg by mouth every 6 (six) hours as needed (For headache.).    Marland Kitchen glucosamine-chondroitin 500-400 MG tablet Take 1 tablet by mouth daily with lunch.     . lidocaine-prilocaine (EMLA) cream Apply 1 application topically daily as needed (Applies to port-a-cath.). 30 g 0  . mirabegron ER (MYRBETRIQ) 25 MG TB24 tablet Take 25 mg by mouth daily.    . Multiple Vitamin (MULTIVITAMIN WITH MINERALS) TABS Take 1 tablet by mouth daily with lunch. She takes Dance movement psychotherapist Adult 50+.    . Omega-3  Fatty Acids (FISH OIL CONCENTRATE PO) Take by mouth. Pt unsure of dose, takes daily    . oxyCODONE-acetaminophen (PERCOCET/ROXICET) 5-325 MG per tablet Take 1 tablet by mouth every 6 (six) hours as needed for severe pain. 40 tablet 0  . polyethylene glycol (MIRALAX / GLYCOLAX) packet Take 17 g by mouth daily as needed.    . prochlorperazine (COMPAZINE) 10 MG tablet Take 1 tablet (10 mg total) by mouth every 6 (six) hours as needed for nausea or vomiting. 30  tablet 0  . senna (SENOKOT) 8.6 MG TABS tablet Take 1 tablet by mouth daily as needed for mild constipation.    . diphenoxylate-atropine (LOMOTIL) 2.5-0.025 MG per tablet Take 1 tablet by mouth 2 (two) times daily as needed for diarrhea or loose stools. (Patient not taking: Reported on 02/06/2014) 60 tablet 1  . ondansetron (ZOFRAN) 8 MG tablet Take 1 tablet (8 mg total) by mouth every 8 (eight) hours as needed for nausea or vomiting. (Patient not taking: Reported on 02/06/2014) 20 tablet 0  . sulfamethoxazole-trimethoprim (BACTRIM DS,SEPTRA DS) 800-160 MG per tablet Take 1 tablet by mouth twice a day on Mondays and Thursdays (Patient not taking: Reported on 02/06/2014) 30 tablet 1  . temozolomide (TEMODAR) 250 MG capsule Take 1 capsule (250 mg total) by mouth daily for 5 days every 4 weeks. (Patient not taking: Reported on 02/06/2014) 5 capsule 2   No current facility-administered medications for this visit.   Facility-Administered Medications Ordered in Other Visits  Medication Dose Route Frequency Provider Last Rate Last Dose  . heparin lock flush 100 unit/mL  500 Units Intracatheter Once PRN Curt Bears, MD      . prochlorperazine (COMPAZINE) tablet 10 mg  10 mg Oral Once Curt Bears, MD      . sodium chloride 0.9 % injection 10 mL  10 mL Intracatheter PRN Curt Bears, MD   10 mL at 01/17/14 1726  . sodium chloride 0.9 % injection 10 mL  10 mL Intracatheter PRN Curt Bears, MD        SURGICAL HISTORY:  Past Surgical History  Procedure Laterality Date  . Nasal sinus surgery    . Video bronchoscopy Bilateral 07/05/2012    Procedure: VIDEO BRONCHOSCOPY WITHOUT FLUORO;  Surgeon: Tanda Rockers, MD;  Location: [facility];  Service: Cardiopulmonary;  Laterality: Bilateral;  . Portacath placement      REVIEW OF SYSTEMS:  Constitutional: positive for fatigue Eyes: negative Ears, nose, mouth, throat, and face: negative Respiratory: positive for dyspnea on  exertion Cardiovascular: negative Gastrointestinal: positive for nausea and vomiting Genitourinary:negative Integument/breast: negative Hematologic/lymphatic: negative Musculoskeletal:negative Neurological: negative Behavioral/Psych: negative Endocrine: negative Allergic/Immunologic: negative   PHYSICAL EXAMINATION: General appearance: alert, cooperative, fatigued and no distress Head: Normocephalic, without obvious abnormality, atraumatic Neck: no adenopathy, no JVD, supple, symmetrical, trachea midline and thyroid not enlarged, symmetric, no tenderness/mass/nodules Lymph nodes: Cervical, supraclavicular, and axillary nodes normal. Resp: clear to auscultation bilaterally Back: symmetric, no curvature. ROM normal. No CVA tenderness. Cardio: regular rate and rhythm, S1, S2 normal, no murmur, click, rub or gallop GI: soft, non-tender; bowel sounds normal; no masses,  no organomegaly Extremities: extremities normal, atraumatic, no cyanosis or edema Neurologic: Alert and oriented X 3, normal strength and tone. Normal symmetric reflexes. Normal coordination and gait  ECOG PERFORMANCE STATUS: 1 - Symptomatic but completely ambulatory  Blood pressure 127/71, pulse 107, temperature 98 F (36.7 C), resp. rate 18, height 5\' 2"  (1.575 m), weight 111 lb 6.4 oz (50.531 kg), SpO2 98 %.  LABORATORY DATA: Lab Results  Component Value Date   WBC 9.7 02/05/2014   HGB 12.5 02/05/2014   HCT 39.1 02/05/2014   MCV 93.2 02/05/2014   PLT 263 02/05/2014      Chemistry      Component Value Date/Time   NA 134* 02/05/2014 1342   NA 131* 10/04/2013 1347   K 4.6 02/05/2014 1342   K 4.7 10/04/2013 1347   CL 97 10/04/2013 1347   CL 93* 07/10/2012 1544   CO2 28 02/05/2014 1342   CO2 26 10/04/2013 1347   BUN 19.9 02/05/2014 1342   BUN 20 10/04/2013 1347   CREATININE 1.3* 02/05/2014 1342   CREATININE 1.04 10/04/2013 1347      Component Value Date/Time   CALCIUM 9.5 02/05/2014 1342   CALCIUM 8.9  10/04/2013 1347   ALKPHOS 129 02/05/2014 1342   ALKPHOS 84 10/04/2013 1347   AST 29 02/05/2014 1342   AST 19 10/04/2013 1347   ALT 23 02/05/2014 1342   ALT 17 10/04/2013 1347   BILITOT 0.20 02/05/2014 1342   BILITOT 0.2 10/04/2013 1347       RADIOGRAPHIC STUDIES:  No results found.  ASSESSMENT AND PLAN: this is a very pleasant 74 years old white female with extensive stage small cell lung cancer status post 4 cycles of systemic chemotherapy with carboplatin and etoposide with significant improvement in her disease. She was on observation for few months and the patient had evidence for disease progression on restaging scan. She was started on second line chemotherapy with cisplatin and irinotecan status post 9 cycles. This was discontinued secondary to disease progression. She was also treated with 2 cycles of oral Temodar but discontinued secondary to disease progression. CT scan showed evidence for disease progression in the lung as well as the liver and the bilateral adrenal metastasis.  The patient is now status post one cycle of systemic chemotherapy with carboplatin and etoposide. She tolerated it quite well with the exception of mild fatigue. She also had some mild nausea and vomiting. Patient was discussed with and also seen by Dr. Julien Nordmann. She'll proceed with cycle #2 of her systemic chemotherapy with carboplatin and etoposide with Neulasta support. She will continue with weekly labs as scheduled. She'll follow-up in 3 weeks with a restaging CT scan of the chest, abdomen and pelvis without contrast to reevaluate her disease. The study is being done without contrast in deference to her mild renal insufficiency. She will continue to have weekly labs.  She was advised to call immediately if she has any concerning symptoms in the interval.  The patient voices understanding of current disease status and treatment options and is in agreement with the current care plan.   Carlton Adam, PA-C 02/06/2014    ADDENDUM: Hematology/Oncology Attending: I had a face to face encounter with the patient today. I recommended her care plan. This is a very pleasant 74 years old white female with extensive stage small cell lung cancer diagnosed in June 2014 status post several chemotherapy regimens and currently undergoing repeat course of systemic chemotherapy with carboplatin and etoposide status post 1 cycle. She tolerated the first cycle of her treatment fairly well with no significant adverse effects. She actually feels much better after starting her systemic chemotherapy with this regimen. I recommended for her to proceed with cycle #2 today as a scheduled. She would come back for follow-up visit in 3 weeks for reevaluation after repeating CT scan of the chest, abdomen and pelvis for restaging  of her disease. The patient was advised to call immediately if she has any concerning symptoms in the interval.  Disclaimer: This note was dictated with voice recognition software. Similar sounding words can inadvertently be transcribed and may be missed upon review. Eilleen Kempf., MD 02/06/2014

## 2014-02-06 NOTE — Telephone Encounter (Signed)
Gave avs & calendar for January/February. Sent message to schedule treatment.

## 2014-02-06 NOTE — Patient Instructions (Signed)
Continue weekly labs as scheduled Follow-up in 3 weeks with a restaging CT scan of your chest, abdomen and pelvis to reevaluate your disease, prior to your next scheduled cycle of chemotherapy

## 2014-02-07 ENCOUNTER — Telehealth: Payer: Self-pay | Admitting: *Deleted

## 2014-02-07 ENCOUNTER — Ambulatory Visit (HOSPITAL_BASED_OUTPATIENT_CLINIC_OR_DEPARTMENT_OTHER): Payer: Medicare Other

## 2014-02-07 DIAGNOSIS — C3492 Malignant neoplasm of unspecified part of left bronchus or lung: Secondary | ICD-10-CM

## 2014-02-07 DIAGNOSIS — Z5111 Encounter for antineoplastic chemotherapy: Secondary | ICD-10-CM

## 2014-02-07 DIAGNOSIS — C3402 Malignant neoplasm of left main bronchus: Secondary | ICD-10-CM

## 2014-02-07 DIAGNOSIS — Z5189 Encounter for other specified aftercare: Secondary | ICD-10-CM

## 2014-02-07 MED ORDER — SODIUM CHLORIDE 0.9 % IV SOLN
Freq: Once | INTRAVENOUS | Status: AC
  Administered 2014-02-07: 15:00:00 via INTRAVENOUS

## 2014-02-07 MED ORDER — HEPARIN SOD (PORK) LOCK FLUSH 100 UNIT/ML IV SOLN
500.0000 [IU] | Freq: Once | INTRAVENOUS | Status: AC | PRN
  Administered 2014-02-07: 500 [IU]
  Filled 2014-02-07: qty 5

## 2014-02-07 MED ORDER — PROCHLORPERAZINE MALEATE 10 MG PO TABS
10.0000 mg | ORAL_TABLET | Freq: Once | ORAL | Status: AC
Start: 2014-02-07 — End: 2014-02-07
  Administered 2014-02-07: 10 mg via ORAL

## 2014-02-07 MED ORDER — PEGFILGRASTIM 6 MG/0.6ML ~~LOC~~ PSKT
6.0000 mg | PREFILLED_SYRINGE | Freq: Once | SUBCUTANEOUS | Status: AC
  Administered 2014-02-07: 6 mg via SUBCUTANEOUS
  Filled 2014-02-07: qty 0.6

## 2014-02-07 MED ORDER — SODIUM CHLORIDE 0.9 % IV SOLN
100.0000 mg/m2 | Freq: Once | INTRAVENOUS | Status: AC
  Administered 2014-02-07: 150 mg via INTRAVENOUS
  Filled 2014-02-07: qty 7.5

## 2014-02-07 MED ORDER — PROCHLORPERAZINE MALEATE 10 MG PO TABS
ORAL_TABLET | ORAL | Status: AC
  Filled 2014-02-07: qty 1

## 2014-02-07 MED ORDER — SODIUM CHLORIDE 0.9 % IJ SOLN
10.0000 mL | INTRAMUSCULAR | Status: DC | PRN
  Administered 2014-02-07: 10 mL
  Filled 2014-02-07: qty 10

## 2014-02-07 NOTE — Patient Instructions (Signed)
[facility] Discharge Instructions for Patients Receiving Chemotherapy  Today you received the following chemotherapy agents etoposide  To help prevent nausea and vomiting after your treatment, we encourage you to take your nausea medication as directed   If you develop nausea and vomiting that is not controlled by your nausea medication, call the clinic.   BELOW ARE SYMPTOMS THAT SHOULD BE REPORTED IMMEDIATELY:  *FEVER GREATER THAN 100.5 F  *CHILLS WITH OR WITHOUT FEVER  NAUSEA AND VOMITING THAT IS NOT CONTROLLED WITH YOUR NAUSEA MEDICATION  *UNUSUAL SHORTNESS OF BREATH  *UNUSUAL BRUISING OR BLEEDING  TENDERNESS IN MOUTH AND THROAT WITH OR WITHOUT PRESENCE OF ULCERS  *URINARY PROBLEMS  *BOWEL PROBLEMS  UNUSUAL RASH Items with * indicate a potential emergency and should be followed up as soon as possible.  Feel free to call the clinic you have any questions or concerns. The clinic phone number is [phone].

## 2014-02-07 NOTE — Telephone Encounter (Signed)
Per staff message and POF I have scheduled appts. Advised scheduler of appts and to move injection appt by one day. Also advised scheduler that MD appt on 2/10 is to late for treatment. JMW

## 2014-02-08 ENCOUNTER — Other Ambulatory Visit: Payer: Self-pay | Admitting: Physician Assistant

## 2014-02-08 ENCOUNTER — Ambulatory Visit: Payer: Medicare Other

## 2014-02-08 ENCOUNTER — Other Ambulatory Visit: Payer: Self-pay | Admitting: *Deleted

## 2014-02-08 DIAGNOSIS — C3492 Malignant neoplasm of unspecified part of left bronchus or lung: Secondary | ICD-10-CM

## 2014-02-08 MED ORDER — OXYCODONE-ACETAMINOPHEN 5-325 MG PO TABS: 1.0000 | ORAL_TABLET | Freq: Four times a day (QID) | ORAL | Status: DC | PRN

## 2014-02-08 NOTE — Telephone Encounter (Signed)
Patient's daughter Truman Hayward [phone]) called requesting mom's refill at 0930.  Called Lee at 11:30 to inform her script is ready for pick up.

## 2014-02-09 ENCOUNTER — Ambulatory Visit: Payer: Medicare Other

## 2014-02-12 ENCOUNTER — Telehealth: Payer: Self-pay | Admitting: Internal Medicine

## 2014-02-12 ENCOUNTER — Ambulatory Visit
Admission: RE | Admit: 2014-02-12 | Discharge: 2014-02-12 | Disposition: A | Discharge status: Home / Self Care | Payer: Medicare Other | Source: Ambulatory Visit | Attending: Radiation Oncology | Admitting: Radiation Oncology

## 2014-02-12 DIAGNOSIS — C7931 Secondary malignant neoplasm of brain: Secondary | ICD-10-CM

## 2014-02-12 MED ORDER — GADOBENATE DIMEGLUMINE 529 MG/ML IV SOLN
5.0000 mL | Freq: Once | INTRAVENOUS | Status: AC | PRN
  Administered 2014-02-12: 5 mL via INTRAVENOUS

## 2014-02-12 NOTE — Telephone Encounter (Signed)
Left voice mail confirming all apointments. Advice patient to pick new calendar at next appointment.

## 2014-02-13 ENCOUNTER — Ambulatory Visit
Admission: RE | Admit: 2014-02-13 | Discharge: 2014-02-13 | Disposition: A | Discharge status: Home / Self Care | Payer: Medicare Other | Source: Ambulatory Visit | Attending: Radiation Oncology | Admitting: Radiation Oncology

## 2014-02-13 ENCOUNTER — Other Ambulatory Visit (HOSPITAL_BASED_OUTPATIENT_CLINIC_OR_DEPARTMENT_OTHER): Payer: Medicare Other

## 2014-02-13 ENCOUNTER — Encounter: Payer: Self-pay | Admitting: Radiation Oncology

## 2014-02-13 VITALS — BP 127/79 | HR 109 | Temp 97.6°F | Resp 20 | Wt 108.0 lb

## 2014-02-13 DIAGNOSIS — C3402 Malignant neoplasm of left main bronchus: Secondary | ICD-10-CM

## 2014-02-13 DIAGNOSIS — C7931 Secondary malignant neoplasm of brain: Secondary | ICD-10-CM

## 2014-02-13 DIAGNOSIS — C7949 Secondary malignant neoplasm of other parts of nervous system: Principal | ICD-10-CM

## 2014-02-13 DIAGNOSIS — C3492 Malignant neoplasm of unspecified part of left bronchus or lung: Secondary | ICD-10-CM

## 2014-02-13 DIAGNOSIS — C787 Secondary malignant neoplasm of liver and intrahepatic bile duct: Secondary | ICD-10-CM

## 2014-02-13 DIAGNOSIS — C7951 Secondary malignant neoplasm of bone: Secondary | ICD-10-CM

## 2014-02-13 LAB — CBC WITH DIFFERENTIAL/PLATELET
BASO%: 0.4 % (ref 0.0–2.0)
Basophils Absolute: 0 10*3/uL (ref 0.0–0.1)
EOS ABS: 0 10*3/uL (ref 0.0–0.5)
EOS%: 0 % (ref 0.0–7.0)
HCT: 34.1 % — ABNORMAL LOW (ref 34.8–46.6)
HGB: 11.1 g/dL — ABNORMAL LOW (ref 11.6–15.9)
LYMPH%: 5.3 % — ABNORMAL LOW (ref 14.0–49.7)
MCH: 30.1 pg (ref 25.1–34.0)
MCHC: 32.6 g/dL (ref 31.5–36.0)
MCV: 92.4 fL (ref 79.5–101.0)
MONO#: 1 10*3/uL — AB (ref 0.1–0.9)
MONO%: 9.8 % (ref 0.0–14.0)
NEUT#: 8.7 10*3/uL — ABNORMAL HIGH (ref 1.5–6.5)
NEUT%: 84.5 % — ABNORMAL HIGH (ref 38.4–76.8)
Platelets: 205 10*3/uL (ref 145–400)
RBC: 3.69 10*6/uL — AB (ref 3.70–5.45)
RDW: 13.6 % (ref 11.2–14.5)
WBC: 10.2 10*3/uL (ref 3.9–10.3)
lymph#: 0.5 10*3/uL — ABNORMAL LOW (ref 0.9–3.3)

## 2014-02-13 LAB — COMPREHENSIVE METABOLIC PANEL (CC13)
ALBUMIN: 3.6 g/dL (ref 3.5–5.0)
ALT: 29 U/L (ref 0–55)
ANION GAP: 10 meq/L (ref 3–11)
AST: 24 U/L (ref 5–34)
Alkaline Phosphatase: 160 U/L — ABNORMAL HIGH (ref 40–150)
BUN: 19.4 mg/dL (ref 7.0–26.0)
CALCIUM: 9.1 mg/dL (ref 8.4–10.4)
CO2: 25 meq/L (ref 22–29)
Chloride: 100 mEq/L (ref 98–109)
Creatinine: 1.1 mg/dL (ref 0.6–1.1)
EGFR: 50 mL/min/{1.73_m2} — ABNORMAL LOW (ref >90)
GLUCOSE: 78 mg/dL (ref 70–140)
POTASSIUM: 4.1 meq/L (ref 3.5–5.1)
Sodium: 136 mEq/L (ref 136–145)
TOTAL PROTEIN: 6.6 g/dL (ref 6.4–8.3)
Total Bilirubin: 0.43 mg/dL (ref 0.20–1.20)

## 2014-02-13 NOTE — Progress Notes (Signed)
Radiation Oncology         [phone] ________________________________  Name: Cheryl Nielsen MRN: [id number]  Date: 02/13/2014  DOB: [birth year]  Follow-Up Visit Note  CC: No PCP Per Patient  No ref. provider found Curt Bears MD     ICD-9-CM ICD-10-CM   1. Brain metastases 198.3 C79.31     Diagnosis and Prior Radiotherapy:   Stage IV Small Cell Lung Cancer with brain metastases  Radiation treatment dates: 08/24/2012-09/11/2012  Site/dose: Whole Brain / 32.5 Gy in 13 fractions  Radiation treatment dates: 07/24/2012-08/10/2012  Site/dose: Mediastinum and Left Hilum / 35 Gy in 14 fractions    Narrative:  The patient returns today for routine follow-up.  She denies new neurologic symptoms, but is sometimes forgetful and feels weaker; she is taking chemotherapy for salvage of progressive disease in her Chest and Abdomen.  MRI of brain on 1-26 showed many new sub centimeter metastases, reviewed today at tumor board.   ALLERGIES:  is allergic to asa; codeine; and tramadol.  Meds: Current Outpatient Prescriptions  Medication Sig Dispense Refill  . acetaminophen (TYLENOL) 500 MG tablet Take 1,000 mg by mouth every 6 (six) hours as needed (For headache.).    Marland Kitchen diphenoxylate-atropine (LOMOTIL) 2.5-0.025 MG per tablet Take 1 tablet by mouth 2 (two) times daily as needed for diarrhea or loose stools. 60 tablet 1  . glucosamine-chondroitin 500-400 MG tablet Take 1 tablet by mouth daily with lunch.     . lidocaine-prilocaine (EMLA) cream Apply 1 application topically daily as needed (Applies to port-a-cath.). 30 g 0  . mirabegron ER (MYRBETRIQ) 25 MG TB24 tablet Take 25 mg by mouth daily.    . Multiple Vitamin (MULTIVITAMIN WITH MINERALS) TABS Take 1 tablet by mouth daily with lunch. She takes Dance movement psychotherapist Adult 50+.    . Omega-3 Fatty Acids (FISH OIL CONCENTRATE PO) Take by mouth. Pt unsure of dose, takes daily    . ondansetron (ZOFRAN) 8 MG tablet Take 1 tablet (8 mg total) by mouth every 8  (eight) hours as needed for nausea or vomiting. 20 tablet 0  . oxyCODONE-acetaminophen (PERCOCET/ROXICET) 5-325 MG per tablet Take 1 tablet by mouth every 6 (six) hours as needed for severe pain. 40 tablet 0  . polyethylene glycol (MIRALAX / GLYCOLAX) packet Take 17 g by mouth daily as needed.    . prochlorperazine (COMPAZINE) 10 MG tablet Take 1 tablet (10 mg total) by mouth every 6 (six) hours as needed for nausea or vomiting. 30 tablet 0  . senna (SENOKOT) 8.6 MG TABS tablet Take 1 tablet by mouth daily as needed for mild constipation.    . sulfamethoxazole-trimethoprim (BACTRIM DS,SEPTRA DS) 800-160 MG per tablet Take 1 tablet by mouth twice a day on Mondays and Thursdays 30 tablet 1  . temozolomide (TEMODAR) 250 MG capsule Take 1 capsule (250 mg total) by mouth daily for 5 days every 4 weeks. 5 capsule 2   No current facility-administered medications for this encounter.   Facility-Administered Medications Ordered in Other Encounters  Medication Dose Route Frequency Provider Last Rate Last Dose  . prochlorperazine (COMPAZINE) tablet 10 mg  10 mg Oral Once Curt Bears, MD      . sodium chloride 0.9 % injection 10 mL  10 mL Intracatheter PRN Curt Bears, MD   10 mL at 01/17/14 1726    Physical Findings: The patient is in no acute distress. Patient is alert and oriented.  weight is 108 lb (48.988 kg). Her temperature is 97.6 F (  36.4 C). Her blood pressure is 127/79 and her pulse is 109. Her respiration is 20 and oxygen saturation is 100%. .  General: Alert and oriented, in no acute distress HEENT: Head is normocephalic.  Extraocular movements are intact. Oropharynx is clear. Extremities: No cyanosis or edema. HEART: RRR CHEST:good airflow bilaterally Musculoskeletal: symmetric strength and muscle tone throughout. Ambulatory. Neurologic: Cranial nerves II through XII are grossly intact. No obvious focalities. Speech is fluent. Coordination is intact. Psychiatric: Judgment and  insight are intact. Affect is appropriate.  KPS = 70  100 - Normal; no complaints; no evidence of disease. 90   - Able to carry on normal activity; minor signs or symptoms of disease. 80   - Normal activity with effort; some signs or symptoms of disease. 79   - Cares for self; unable to carry on normal activity or to do active work. 60   - Requires occasional assistance, but is able to care for most of his personal needs. 50   - Requires considerable assistance and frequent medical care. 62   - Disabled; requires special care and assistance. 86   - Severely disabled; hospital admission is indicated although death not imminent. 105   - Very sick; hospital admission necessary; active supportive treatment necessary. 10   - Moribund; fatal processes progressing rapidly. 0     - Dead  Karnofsky DA, Abelmann [place], Craver LS and Burchenal [facility] [phone]) The use of the nitrogen mustards in the palliative treatment of carcinoma: with particular reference to bronchogenic carcinoma Cancer 1 634-56  Lab Findings: Lab Results  Component Value Date   WBC 10.2 02/13/2014   HGB 11.1* 02/13/2014   HCT 34.1* 02/13/2014   MCV 92.4 02/13/2014   PLT 205 02/13/2014    Lab Results  Component Value Date   TSH 3.290 10/11/2013    Radiographic Findings: [name] UU Contrast  [name] VO Contrast  02/12/2014   CLINICAL DATA:  Small cell lung cancer with brain metastases. Whole-brain radiation in 08/2012. Ongoing chemotherapy.  EXAM: MRI HEAD WITHOUT AND WITH CONTRAST  TECHNIQUE: Multiplanar, multiecho pulse sequences of the brain and surrounding structures were obtained without and with intravenous contrast.  CONTRAST:  50mL MULTIHANCE GADOBENATE DIMEGLUMINE 529 MG/ML IV SOLN  COMPARISON:  10/08/2013  FINDINGS: There is no acute infarct, midline shift, or extra-axial fluid collection. Extensive confluent T2 hyperintensity in the cerebral white matter is stable to minimally increased from the prior study and  likely reflects post radiation changes. There is mild cerebral atrophy.  Small focus chronic hemorrhage in the superior right parietal lobe near the vertex is unchanged. There is a new focus chronic hemorrhage in the right superior temporal gyrus (series 5, image 10) with evidence of a punctate 2 mm focus of enhancement on sagittal T1 postcontrast images (series 12, image 5), not definitely confirmed on axial or coronal images. A similar new focus of chronic microhemorrhage is present in the posterior left frontal lobe in the precentral gyrus (series 5, image 20), also with 2 mm focus of enhancement (series 10, image 116 and series 12, image 28). There are 4 additional new foci of chronic hemorrhage without definite enhancement, located in the left superior frontal gyrus (series 5, image 20), in the inferior left frontal lobe (series 5, image 16), near the right temporoparietal junction (series 5, image 13), and in the left cerebellum just below the tentorium (series 5, image 8).  There are numerous new, small foci of subtle enhancement involving both  cerebellar hemispheres and vermis, most conspicuous on coronal images (for instance series 11, image 16), and measuring up to 3 mm in size. There is no associated edema.  A 3 mm enhancing focus is questioned in the right parietal lobe (series 10, image 89), not definitely confirmed in other planes. There is a 3 mm enhancing cortical lesion slightly more superiorly in the right parietal lobe (series 10, image 101 and series 12, image 11). Small enhancing right frontal lesions measure 2 mm (series 10, image 114 and series 12, image 13) and 4 mm (series 10, image 91 and and series 12, image 13). Punctate residual enhancing focus in the superior right frontal lobe on the prior study corresponding to a previously treated metastasis is not clearly identified on the current study.  There is a new 3 mm left occipital enhancing lesion (series 10, image 55 and series 11, image  8). Slightly more superiorly in the left occipital lobe is a new 5 mm enhancing lesion (series 10, image 64 and series 11, image 6).  Slightly increased conspicuity of vascular structure versus 2 mm metastases in the posterior left centrum semiovale (series 10 images 93 and 96). A lesion in the body of the left caudate has increased in size and demonstrates some intrinsic T1 hyperintensity but clear enhancement as well, measuring 4 mm (series 10, image 88). There is a smaller lesion in the body of the right caudate which measures 2 mm and also demonstrates intrinsic T1 hyperintensity, similar to the prior study and without definite enhancement.  There is a new 3 mm enhancing lesion in the inferior right lentiform nucleus (series 10, image 70 and series 12, image 11). New punctate 2 mm left precentral gyrus lesion demonstrates enhancement (series 10, image 123 and series 12, image 28). New 4 mm left temporoparietal lesion is also noted (series 10, image 78).  Orbits are unremarkable. Minimal posterior left ethmoid air cell mucosal thickening is noted. Mastoid air cells are clear. Major intracranial vascular flow voids are preserved. 7 mm enhancing osseous metastasis at the anterior aspect of the left middle cranial fossa (series 10, image 45) and 1.5 cm osseous metastasis adjacent to the torcula (series 10, image 45) are unchanged.  IMPRESSION: 1. At least 20 new, tiny metastases throughout both cerebral and cerebellar hemispheres measuring up to 4 mm in size. No associated edema. 2. Unchanged osseous metastases.   Electronically Signed   By: Logan Bores   On: 02/12/2014 15:03    Impression/Plan: Multiple new brain metastases, approximately 1.5 years after WBRT.  We discussed the option of salvage repeat WBRT versus no treatment.  She is asymptomatic.  She is interested in a palliative care consult, and leaning towards repeat WBRT to prevent symptoms from these tumors.  She understands that repeat WBRT carries a  risk of cognitive decline, but to reduce this risk, I would treat at 1.8 Gy/fraction to only 23.4 Gy total.    Plan for her to meet Wadie Lessen, NP of palliative care on 2/1 with simulation to follow.  She has a Chest /Abd CT on 2/5.  I will call her w/ the results.  IF this shows progression despite her chemotherapy, she may lean towards cancelling WBRT.  IF this shows improvement, she may decide that proceeding with WBRT for 2/8 (Monday) is worthwhile.  Consent is signed assuming she will proceed with treatment.    All questions were answered today, but I am happy to speak with her further as needed while  she makes her decision.  _____________________________________   Eppie Gibson, MD

## 2014-02-13 NOTE — Progress Notes (Signed)
Patient denies pain, HA, nausea, dizziness, vision changes, fatigue, loss of appetite. She has fallen twice in past 4 months; daughter states she has been weaker since chemo and will get in a hurry and fall. MRI brain completed on 02/12/14.

## 2014-02-16 ENCOUNTER — Other Ambulatory Visit: Payer: Self-pay | Admitting: Internal Medicine

## 2014-02-18 ENCOUNTER — Ambulatory Visit
Admission: RE | Admit: 2014-02-18 | Discharge: 2014-02-18 | Disposition: A | Discharge status: Home / Self Care | Payer: Medicare Other | Source: Ambulatory Visit | Attending: Internal Medicine | Admitting: Internal Medicine

## 2014-02-18 ENCOUNTER — Ambulatory Visit
Admission: RE | Admit: 2014-02-18 | Discharge: 2014-02-18 | Disposition: A | Discharge status: Home / Self Care | Payer: Medicare Other | Source: Ambulatory Visit | Attending: Radiation Oncology | Admitting: Radiation Oncology

## 2014-02-18 DIAGNOSIS — Z923 Personal history of irradiation: Secondary | ICD-10-CM | POA: Diagnosis not present

## 2014-02-18 DIAGNOSIS — R530 Neoplastic (malignant) related fatigue: Secondary | ICD-10-CM | POA: Insufficient documentation

## 2014-02-18 DIAGNOSIS — Z510 Encounter for antineoplastic radiation therapy: Secondary | ICD-10-CM | POA: Diagnosis present

## 2014-02-18 DIAGNOSIS — C7949 Secondary malignant neoplasm of other parts of nervous system: Secondary | ICD-10-CM

## 2014-02-18 DIAGNOSIS — C3492 Malignant neoplasm of unspecified part of left bronchus or lung: Secondary | ICD-10-CM | POA: Insufficient documentation

## 2014-02-18 DIAGNOSIS — Z7189 Other specified counseling: Secondary | ICD-10-CM | POA: Diagnosis not present

## 2014-02-18 DIAGNOSIS — C7931 Secondary malignant neoplasm of brain: Secondary | ICD-10-CM

## 2014-02-18 DIAGNOSIS — Z515 Encounter for palliative care: Secondary | ICD-10-CM | POA: Diagnosis not present

## 2014-02-18 DIAGNOSIS — Z9221 Personal history of antineoplastic chemotherapy: Secondary | ICD-10-CM | POA: Diagnosis not present

## 2014-02-18 NOTE — Consult Note (Signed)
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  Patient and family living with the physical, functional and financial stressors associated with life limiting disease.   Consult is for introduction to the concept of Palliative Medicine, clarification of Advanced Directives,  holistic support and symptom management as indicated.  This NP Wadie Lessen reviewed medical records, received report from team, assessed the patient and then meet with the patient and her daughter Cheryl Nielsen # [phone] in the outpatient radiation-oncology clinic to discuss diagnosis, prognosis, treatment options and overall GOC  Concept of Palliative Medicine was discussed  A discussion was had today regarding advanced directives.  Values and goals of care important to patient and family were attempted to be elicited.  Questions and concerns addressed.    PMT will continue to support holistically.    Goals of Care:  1.  Code Status:  DNR/DNI   2. Scope of Treatment:  At this time patient is open to all availble and offered medical interventions to prolong quality life.  She clearly expresses her feelings and emotions regarding the intention of all therapy is QUALITY of life,  and her daughter is able to verbalize her support and understanding.     4. Symptom Management:   1. Fatigue: -Pace yourself -Plan your day -Include naps and breaks -get a little exercise -fuel the body -consider complementary therapies   -deep breathing  -prayer/medication   -guided meditation -discussed importance of re-evaluating personal independence and consideration of need for outside support  *Discussed utilization of low dose Methylphenidate, will f/u next Monday with daughter to discuss in detail after f/u scans complete  2. Pain:  Continue with Percocet  5-325 mg every 6 hrs prn as prescribed by oncology  3. Bowel Regimen: Senna 8.6 mg daily prn  5. Psychosocial:  Emotional support offered to patient and daughter.  Both express "how hard it is" facing ones own mortality and very specific to Drianna is her fear of losing her independence.   She remains living at home with the help of her family.  Offered education of brain tumor support group.  6. Spiritual:  Strong community church support   Patient Documents Completed or Given: Document Given Completed  Advanced Directives Pkt    MOST X   DNR  X  Gone from My Sight    Hard Choices      Brief HPI:  Extensive stage small cell lung cancer with large obstructing center left lung mass with large pleural effusion and liver and brain metastasis diagnosed in June of 2014 Simulation today for planned whole brain radiation.  ROS: fatigue   PMH:  Past Medical History  Diagnosis Date  . Pneumonia, organism unspecified   . Status post chemotherapy  10/11/2012     carboplatin  . S/P radiation therapy 08/24/2012-09/11/2012    Whole Brain  / 32.5 Gy in 13 fractions  . 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 S/P radiation therapy  07/24/2012-08/10/2012      Mediastinum and Left Hilum / 35 Gy in 14 fractions       PSH: Past Surgical History  Procedure Laterality Date  . Nasal sinus surgery    . Video bronchoscopy Bilateral 07/05/2012    Procedure: VIDEO BRONCHOSCOPY WITHOUT FLUORO;  Surgeon: Tanda Rockers, MD;  Location: [facility];  Service: Cardiopulmonary;  Laterality: Bilateral;  . Portacath placement     I have reviewed the FH and SH and   If appropriate update it with new information. Allergies  Allergen Reactions  . Asa [Aspirin]     GI upset  . Codeine     syncope  . Tramadol Nausea And Vomiting   Scheduled Meds: Continuous Infusions: PRN Meds:.    There were no vitals taken for this visit.     No intake or output data in the 24 hours ending 02/18/14 1111   Physical Exam:  General: alert and oriented, meticulously groomed, fatigued HEENT:  moist buccal membranes  Skin: warm and dry  Labs: CBC    Component Value Date/Time   WBC 10.2 02/13/2014 1040   WBC 8.6 10/31/2012 1445   RBC 3.69* 02/13/2014 1040   RBC 4.01 10/31/2012 1445   HGB 11.1* 02/13/2014 1040   HGB 11.8* 10/31/2012 1445   HCT 34.1* 02/13/2014 1040   HCT 34.8* 10/31/2012 1445   PLT 205 02/13/2014 1040   PLT 333 10/31/2012 1445   MCV 92.4 02/13/2014 1040   MCV 86.8 10/31/2012 1445   MCH 30.1 02/13/2014 1040   MCH 29.4 10/31/2012 1445   MCHC 32.6 02/13/2014 1040   MCHC 33.9 10/31/2012 1445   RDW 13.6 02/13/2014 1040   RDW 17.8* 10/31/2012 1445   LYMPHSABS 0.5* 02/13/2014 1040   LYMPHSABS 1.0 10/31/2012 1445   MONOABS 1.0* 02/13/2014 1040   MONOABS 1.1* 10/31/2012 1445   EOSABS 0.0 02/13/2014 1040   EOSABS 0.0 10/31/2012 1445   BASOSABS 0.0 02/13/2014 1040   BASOSABS 0.0 10/31/2012 1445    BMET    Component Value Date/Time   NA 136 02/13/2014 1041   NA 131* 10/04/2013 1347   K 4.1 02/13/2014 1041   K 4.7 10/04/2013 1347   CL 97 10/04/2013 1347   CL 93* 07/10/2012 1544   CO2 25 02/13/2014 1041   CO2 26 10/04/2013 1347   GLUCOSE 78 02/13/2014 1041   GLUCOSE 86 10/04/2013 1347   GLUCOSE 134* 07/10/2012 1544   BUN 19.4 02/13/2014 1041   BUN 20 10/04/2013 1347   CREATININE 1.1 02/13/2014 1041   CREATININE 1.04 10/04/2013 1347   CALCIUM 9.1 02/13/2014 1041   CALCIUM 8.9 10/04/2013 1347   GFRNONAA 61* 10/31/2012 1445   GFRAA 71* 10/31/2012 1445    CMP     Component Value Date/Time   NA  136 02/13/2014 1041   NA  131* 10/04/2013 1347   K 4.1 02/13/2014 1041   K 4.7 10/04/2013 1347   CL 97 10/04/2013 1347   CL 93* 07/10/2012 1544   CO2 25 02/13/2014 1041   CO2 26 10/04/2013 1347   GLUCOSE 78 02/13/2014 1041   GLUCOSE 86 10/04/2013 1347   GLUCOSE 134* 07/10/2012 1544   BUN 19.4 02/13/2014 1041   BUN 20 10/04/2013 1347   CREATININE 1.1 02/13/2014 1041   CREATININE 1.04 10/04/2013 1347   CALCIUM 9.1 02/13/2014 1041   CALCIUM 8.9 10/04/2013 1347   PROT 6.6 02/13/2014 1041   PROT 6.3 10/04/2013 1347   ALBUMIN 3.6 02/13/2014 1041   ALBUMIN 3.9 10/04/2013 1347   AST 24 02/13/2014 1041   AST 19 10/04/2013 1347   ALT 29 02/13/2014 1041   ALT 17 10/04/2013 1347   ALKPHOS 160* 02/13/2014 1041   ALKPHOS 84 10/04/2013 1347   BILITOT 0.43 02/13/2014 1041   BILITOT 0.2 10/04/2013 1347   GFRNONAA 61* 10/31/2012 1445   GFRAA 71* 10/31/2012 1445   ECOG PERFORMANCE STATUS* (Eastern Cooperative Oncology Group)  0 Fully active, able to continue with all pre-disease activities without restriction. Pt score  1 Restricted in physically strenuous activity but ambulatory and able to carry out work of a light or sedentary nature, e.g., light house work, office work. 1  2 Ambulatory and capable of all self-care but unable to carry out any work activities. Up and about more than 50% of waking hours.    3 Capable of only limited self-care. Confined to bed or chair more than 50% of waking hours.   4 Completely disabled. Cannot carry on any self-care. Totally confined to bed or chair.   5 Dead.    As published in Am. J. Clin. Oncol.: Eustace Pen, M.M., Colon Flattery., M[place]annon, D.C., Horton, Sharen Hint., Drexel Iha, P.P.: Toxicity And Response Criteria Of The [facility]. [place] 5:035-465, 1982.  The ECOG Performance Status is in the public domain therefore available for public use. To duplicate the scale, please cite the reference above and credit the [facility], Tyler Pita M.D., Group Chair    Time In Time Out Total Time Spent with Patient Total Overall Time  1000 1115 70 min 75 min    Greater than 50%  of this time was spent counseling and coordinating care related to the above assessment and plan.   Wadie Lessen NP  Palliative Medicine Team Team Phone # [phone] Pager [phone]  Discussed with Dr Isidore Moos

## 2014-02-20 ENCOUNTER — Other Ambulatory Visit (HOSPITAL_BASED_OUTPATIENT_CLINIC_OR_DEPARTMENT_OTHER): Payer: Medicare Other

## 2014-02-20 DIAGNOSIS — Z510 Encounter for antineoplastic radiation therapy: Secondary | ICD-10-CM | POA: Diagnosis not present

## 2014-02-20 DIAGNOSIS — C3402 Malignant neoplasm of left main bronchus: Secondary | ICD-10-CM

## 2014-02-20 DIAGNOSIS — C3492 Malignant neoplasm of unspecified part of left bronchus or lung: Secondary | ICD-10-CM

## 2014-02-20 LAB — COMPREHENSIVE METABOLIC PANEL (CC13)
ALK PHOS: 152 U/L — AB (ref 40–150)
ALT: 23 U/L (ref 0–55)
ANION GAP: 9 meq/L (ref 3–11)
AST: 21 U/L (ref 5–34)
Albumin: 3.7 g/dL (ref 3.5–5.0)
BUN: 17.1 mg/dL (ref 7.0–26.0)
CALCIUM: 9.2 mg/dL (ref 8.4–10.4)
CHLORIDE: 101 meq/L (ref 98–109)
CO2: 27 mEq/L (ref 22–29)
Creatinine: 1.2 mg/dL — ABNORMAL HIGH (ref 0.6–1.1)
EGFR: 44 mL/min/{1.73_m2} — ABNORMAL LOW (ref >90)
GLUCOSE: 79 mg/dL (ref 70–140)
Potassium: 4.1 mEq/L (ref 3.5–5.1)
Sodium: 137 mEq/L (ref 136–145)
Total Protein: 6.9 g/dL (ref 6.4–8.3)

## 2014-02-20 LAB — CBC WITH DIFFERENTIAL/PLATELET
BASO%: 0.4 % (ref 0.0–2.0)
Basophils Absolute: 0 10*3/uL (ref 0.0–0.1)
EOS%: 0.1 % (ref 0.0–7.0)
Eosinophils Absolute: 0 10*3/uL (ref 0.0–0.5)
HEMATOCRIT: 34.5 % — AB (ref 34.8–46.6)
HEMOGLOBIN: 11.1 g/dL — AB (ref 11.6–15.9)
LYMPH%: 5.6 % — ABNORMAL LOW (ref 14.0–49.7)
MCH: 30 pg (ref 25.1–34.0)
MCHC: 32.3 g/dL (ref 31.5–36.0)
MCV: 92.8 fL (ref 79.5–101.0)
MONO#: 1.2 10*3/uL — AB (ref 0.1–0.9)
MONO%: 10.6 % (ref 0.0–14.0)
NEUT#: 9.8 10*3/uL — ABNORMAL HIGH (ref 1.5–6.5)
NEUT%: 83.3 % — AB (ref 38.4–76.8)
PLATELETS: 152 10*3/uL (ref 145–400)
RBC: 3.71 10*6/uL (ref 3.70–5.45)
RDW: 13.9 % (ref 11.2–14.5)
WBC: 11.7 10*3/uL — AB (ref 3.9–10.3)
lymph#: 0.7 10*3/uL — ABNORMAL LOW (ref 0.9–3.3)

## 2014-02-21 ENCOUNTER — Other Ambulatory Visit: Payer: Self-pay | Admitting: *Deleted

## 2014-02-21 DIAGNOSIS — C3492 Malignant neoplasm of unspecified part of left bronchus or lung: Secondary | ICD-10-CM

## 2014-02-21 MED ORDER — OXYCODONE-ACETAMINOPHEN 5-325 MG PO TABS: 1.0000 | ORAL_TABLET | Freq: Four times a day (QID) | ORAL | Status: DC | PRN

## 2014-02-22 ENCOUNTER — Ambulatory Visit (HOSPITAL_COMMUNITY)
Admission: RE | Admit: 2014-02-22 | Discharge: 2014-02-22 | Disposition: A | Discharge status: Home / Self Care | Payer: Medicare Other | Source: Ambulatory Visit | Attending: Physician Assistant | Admitting: Physician Assistant

## 2014-02-22 ENCOUNTER — Telehealth: Payer: Self-pay

## 2014-02-22 DIAGNOSIS — C3492 Malignant neoplasm of unspecified part of left bronchus or lung: Secondary | ICD-10-CM | POA: Insufficient documentation

## 2014-02-22 MED ORDER — IOHEXOL 300 MG/ML  SOLN
50.0000 mL | Freq: Once | INTRAMUSCULAR | Status: AC | PRN
  Administered 2014-02-22: 50 mL via ORAL

## 2014-02-22 NOTE — Telephone Encounter (Signed)
Daughter called to find out if Rx ready for pickup. Noted percocet was written yesterday and should be at desk for pickup.

## 2014-02-22 NOTE — Progress Notes (Signed)
  Radiation Oncology         [phone] ________________________________  Name: EVALIE HARGRAVES MRN: [id number]  Date: 02/18/2014  DOB: 04/18/1940  SIMULATION AND TREATMENT PLANNING NOTE Special Treatment Procedure Note: Outpatient  DIAGNOSIS:     ICD-9-CM ICD-10-CM   1. Brain metastases 198.3 C79.31     NARRATIVE:  The patient was brought to the [facility].  Identity was confirmed.  All relevant records and images related to the planned course of therapy were reviewed.  The patient freely provided informed written consent to proceed with treatment after reviewing the details related to the planned course of therapy. The consent form was witnessed and verified by the simulation staff.    Then, the patient was set-up in a stable reproducible  supine position for radiation therapy. Aquaplast mask was made.  CT images were obtained.  Surface markings were placed.  The CT images were loaded into the planning software.    TREATMENT PLANNING NOTE: Treatment planning then occurred.  The radiation prescription was entered and confirmed.    A total of 3 medically necessary complex treatment devices were fabricated and supervised by me. I have requested : MLC's. (2 fields, 1 mask)  The patient will receive 23.4 Gy in 13 fractions to her whole brain with 2 fields using MLCs.  Special Treatment Procedure Note: The patient received prior radiotherapy within current fields. There will be overlap of radiation dose.  Prior regional radiotherapy increases the risk of side effects from treatment. I have considered this in the treatment planning process.  This increases the complexity of this patient's treatment and therefore this constitutes a special treatment procedure. -----------------------------------  Eppie Gibson, MD

## 2014-02-25 ENCOUNTER — Telehealth: Payer: Self-pay | Admitting: *Deleted

## 2014-02-25 ENCOUNTER — Ambulatory Visit
Admission: RE | Admit: 2014-02-25 | Discharge: 2014-02-25 | Disposition: A | Discharge status: Home / Self Care | Payer: Medicare Other | Source: Ambulatory Visit | Attending: Radiation Oncology | Admitting: Radiation Oncology

## 2014-02-25 ENCOUNTER — Encounter: Payer: Self-pay | Admitting: Pharmacist

## 2014-02-25 ENCOUNTER — Encounter: Payer: Self-pay | Admitting: Radiation Oncology

## 2014-02-25 ENCOUNTER — Other Ambulatory Visit: Payer: Self-pay | Admitting: Internal Medicine

## 2014-02-25 VITALS — BP 149/90 | HR 103 | Temp 97.8°F | Resp 20 | Wt 109.3 lb

## 2014-02-25 DIAGNOSIS — Z7189 Other specified counseling: Secondary | ICD-10-CM | POA: Insufficient documentation

## 2014-02-25 DIAGNOSIS — R530 Neoplastic (malignant) related fatigue: Secondary | ICD-10-CM

## 2014-02-25 DIAGNOSIS — C7949 Secondary malignant neoplasm of other parts of nervous system: Principal | ICD-10-CM

## 2014-02-25 DIAGNOSIS — C7931 Secondary malignant neoplasm of brain: Secondary | ICD-10-CM

## 2014-02-25 DIAGNOSIS — R5383 Other fatigue: Secondary | ICD-10-CM | POA: Insufficient documentation

## 2014-02-25 DIAGNOSIS — Z515 Encounter for palliative care: Secondary | ICD-10-CM | POA: Insufficient documentation

## 2014-02-25 DIAGNOSIS — Z510 Encounter for antineoplastic radiation therapy: Secondary | ICD-10-CM | POA: Diagnosis not present

## 2014-02-25 MED ORDER — METHYLPHENIDATE HCL 5 MG PO TABS: 5.0000 mg | ORAL_TABLET | Freq: Two times a day (BID) | ORAL | Status: DC

## 2014-02-25 NOTE — Progress Notes (Signed)
   Weekly Management Note:  outpatient    ICD-9-CM ICD-10-CM   1. Brain metastases 198.3 C79.31     Current Dose:  1.8 Gy  Projected Dose: 23.4 Gy   Narrative:  The patient presents for routine under treatment assessment.  CBCT/MVCT images/Port film x-rays were reviewed.  The chart was checked. Slight HA, no other complaints  Physical Findings:  Wt Readings from Last 3 Encounters:  02/25/14 109 lb 4.8 oz (49.578 kg)  02/13/14 108 lb (48.988 kg)  02/12/14 106 lb (48.081 kg)    weight is 109 lb 4.8 oz (49.578 kg). Her temperature is 97.8 F (36.6 C). Her blood pressure is 149/90 and her pulse is 103. Her respiration is 20.  in [facility], NAD  CBC    Component Value Date/Time   WBC 11.7* 02/20/2014 1120   WBC 8.6 10/31/2012 1445   RBC 3.71 02/20/2014 1120   RBC 4.01 10/31/2012 1445   HGB 11.1* 02/20/2014 1120   HGB 11.8* 10/31/2012 1445   HCT 34.5* 02/20/2014 1120   HCT 34.8* 10/31/2012 1445   PLT 152 02/20/2014 1120   PLT 333 10/31/2012 1445   MCV 92.8 02/20/2014 1120   MCV 86.8 10/31/2012 1445   MCH 30.0 02/20/2014 1120   MCH 29.4 10/31/2012 1445   MCHC 32.3 02/20/2014 1120   MCHC 33.9 10/31/2012 1445   RDW 13.9 02/20/2014 1120   RDW 17.8* 10/31/2012 1445   LYMPHSABS 0.7* 02/20/2014 1120   LYMPHSABS 1.0 10/31/2012 1445   MONOABS 1.2* 02/20/2014 1120   MONOABS 1.1* 10/31/2012 1445   EOSABS 0.0 02/20/2014 1120   EOSABS 0.0 10/31/2012 1445   BASOSABS 0.0 02/20/2014 1120   BASOSABS 0.0 10/31/2012 1445     CMP     Component Value Date/Time   NA 137 02/20/2014 1120   NA 131* 10/04/2013 1347   K 4.1 02/20/2014 1120   K 4.7 10/04/2013 1347   CL 97 10/04/2013 1347   CL 93* 07/10/2012 1544   CO2 27 02/20/2014 1120   CO2 26 10/04/2013 1347   GLUCOSE 79 02/20/2014 1120   GLUCOSE 86 10/04/2013 1347   GLUCOSE 134* 07/10/2012 1544   BUN 17.1 02/20/2014 1120   BUN 20 10/04/2013 1347   CREATININE 1.2* 02/20/2014 1120   CREATININE 1.04 10/04/2013 1347   CALCIUM 9.2  02/20/2014 1120   CALCIUM 8.9 10/04/2013 1347   PROT 6.9 02/20/2014 1120   PROT 6.3 10/04/2013 1347   ALBUMIN 3.7 02/20/2014 1120   ALBUMIN 3.9 10/04/2013 1347   AST 21 02/20/2014 1120   AST 19 10/04/2013 1347   ALT 23 02/20/2014 1120   ALT 17 10/04/2013 1347   ALKPHOS 152* 02/20/2014 1120   ALKPHOS 84 10/04/2013 1347   BILITOT <0.20 02/20/2014 1120   BILITOT 0.2 10/04/2013 1347   GFRNONAA 61* 10/31/2012 1445   GFRAA 71* 10/31/2012 1445     Impression:  The patient is tolerating radiotherapy.   Plan:  Continue radiotherapy as planned.  Chemotherapy on hold x 3 wks  -----------------------------------  Eppie Gibson, MD

## 2014-02-25 NOTE — Telephone Encounter (Signed)
Called and left a msg with instructions that per Dr Vista Mink, pt should not come to appts this week for medical oncology due to starting radiation.

## 2014-02-25 NOTE — Progress Notes (Signed)
Patient states she has slight headache, denies other pain. She is not taking Oxycodone. She denies nausea but states she had nausea last night, attributes it to what she ate. She denies dizziness, unsteadiness, recent falls, vision changes.  Patient education completed with patient and friend. Pt has had whole brain radiation in past. Reviewed temporary side effects of fatigue, nausea, hair loss, scalp irritation/care, headache. Patient verbalized understanding.

## 2014-02-26 ENCOUNTER — Ambulatory Visit: Payer: Medicare Other | Admitting: Physician Assistant

## 2014-02-26 ENCOUNTER — Other Ambulatory Visit: Payer: Medicare Other

## 2014-02-26 ENCOUNTER — Ambulatory Visit
Admission: RE | Admit: 2014-02-26 | Discharge: 2014-02-26 | Disposition: A | Discharge status: Home / Self Care | Payer: Medicare Other | Source: Ambulatory Visit | Attending: Radiation Oncology | Admitting: Radiation Oncology

## 2014-02-26 ENCOUNTER — Telehealth: Payer: Self-pay | Admitting: Internal Medicine

## 2014-02-26 ENCOUNTER — Ambulatory Visit: Payer: Medicare Other

## 2014-02-26 DIAGNOSIS — Z510 Encounter for antineoplastic radiation therapy: Secondary | ICD-10-CM | POA: Diagnosis not present

## 2014-02-26 NOTE — Telephone Encounter (Signed)
s.w. pt and advised cx appt and 2.29 appt.Marland KitchenMarland KitchenMarland KitchenMarland Kitchenpt will get whole sched on that day....pt ok and aware

## 2014-02-27 ENCOUNTER — Ambulatory Visit
Admission: RE | Admit: 2014-02-27 | Discharge: 2014-02-27 | Disposition: A | Discharge status: Home / Self Care | Payer: Medicare Other | Source: Ambulatory Visit | Attending: Radiation Oncology | Admitting: Radiation Oncology

## 2014-02-27 ENCOUNTER — Ambulatory Visit: Payer: Medicare Other

## 2014-02-27 ENCOUNTER — Ambulatory Visit: Payer: Medicare Other | Admitting: Physician Assistant

## 2014-02-27 ENCOUNTER — Other Ambulatory Visit: Payer: Medicare Other

## 2014-02-27 DIAGNOSIS — Z510 Encounter for antineoplastic radiation therapy: Secondary | ICD-10-CM | POA: Diagnosis not present

## 2014-02-28 ENCOUNTER — Encounter: Payer: Self-pay | Admitting: Radiation Oncology

## 2014-02-28 ENCOUNTER — Ambulatory Visit
Admission: RE | Admit: 2014-02-28 | Discharge: 2014-02-28 | Disposition: A | Discharge status: Home / Self Care | Payer: Medicare Other | Source: Ambulatory Visit | Attending: Radiation Oncology | Admitting: Radiation Oncology

## 2014-02-28 ENCOUNTER — Ambulatory Visit: Payer: Medicare Other

## 2014-02-28 DIAGNOSIS — Z510 Encounter for antineoplastic radiation therapy: Secondary | ICD-10-CM | POA: Diagnosis not present

## 2014-03-01 ENCOUNTER — Encounter: Payer: Self-pay | Admitting: Radiation Therapy

## 2014-03-01 ENCOUNTER — Encounter (HOSPITAL_COMMUNITY): Payer: Self-pay | Admitting: Emergency Medicine

## 2014-03-01 ENCOUNTER — Ambulatory Visit: Payer: Medicare Other

## 2014-03-01 ENCOUNTER — Emergency Department (HOSPITAL_COMMUNITY): Payer: Medicare Other

## 2014-03-01 ENCOUNTER — Observation Stay (HOSPITAL_BASED_OUTPATIENT_CLINIC_OR_DEPARTMENT_OTHER)
Admission: EM | Admit: 2014-03-01 | Discharge: 2014-03-02 | Disposition: A | Discharge status: Home / Self Care | Payer: Medicare Other | Source: Home / Self Care | Attending: Emergency Medicine | Admitting: Emergency Medicine

## 2014-03-01 ENCOUNTER — Ambulatory Visit
Admission: RE | Admit: 2014-03-01 | Discharge: 2014-03-01 | Disposition: A | Discharge status: Home / Self Care | Payer: Medicare Other | Source: Ambulatory Visit | Attending: Radiation Oncology | Admitting: Radiation Oncology

## 2014-03-01 DIAGNOSIS — Z886 Allergy status to analgesic agent status: Secondary | ICD-10-CM

## 2014-03-01 DIAGNOSIS — R61 Generalized hyperhidrosis: Secondary | ICD-10-CM | POA: Insufficient documentation

## 2014-03-01 DIAGNOSIS — R079 Chest pain, unspecified: Secondary | ICD-10-CM | POA: Insufficient documentation

## 2014-03-01 DIAGNOSIS — Z87891 Personal history of nicotine dependence: Secondary | ICD-10-CM

## 2014-03-01 DIAGNOSIS — R0789 Other chest pain: Secondary | ICD-10-CM

## 2014-03-01 DIAGNOSIS — C7931 Secondary malignant neoplasm of brain: Secondary | ICD-10-CM | POA: Diagnosis present

## 2014-03-01 DIAGNOSIS — Z923 Personal history of irradiation: Secondary | ICD-10-CM

## 2014-03-01 DIAGNOSIS — E860 Dehydration: Secondary | ICD-10-CM | POA: Insufficient documentation

## 2014-03-01 DIAGNOSIS — R0602 Shortness of breath: Secondary | ICD-10-CM | POA: Insufficient documentation

## 2014-03-01 DIAGNOSIS — E871 Hypo-osmolality and hyponatremia: Secondary | ICD-10-CM

## 2014-03-01 DIAGNOSIS — C7949 Secondary malignant neoplasm of other parts of nervous system: Secondary | ICD-10-CM

## 2014-03-01 DIAGNOSIS — C3490 Malignant neoplasm of unspecified part of unspecified bronchus or lung: Secondary | ICD-10-CM | POA: Insufficient documentation

## 2014-03-01 DIAGNOSIS — R410 Disorientation, unspecified: Secondary | ICD-10-CM | POA: Insufficient documentation

## 2014-03-01 DIAGNOSIS — Z9221 Personal history of antineoplastic chemotherapy: Secondary | ICD-10-CM

## 2014-03-01 DIAGNOSIS — C3492 Malignant neoplasm of unspecified part of left bronchus or lung: Secondary | ICD-10-CM

## 2014-03-01 DIAGNOSIS — Z885 Allergy status to narcotic agent status: Secondary | ICD-10-CM

## 2014-03-01 DIAGNOSIS — Z7189 Other specified counseling: Secondary | ICD-10-CM

## 2014-03-01 LAB — URINALYSIS, ROUTINE W REFLEX MICROSCOPIC
BILIRUBIN URINE: NEGATIVE
GLUCOSE, UA: NEGATIVE mg/dL
HGB URINE DIPSTICK: NEGATIVE
KETONES UR: NEGATIVE mg/dL
Leukocytes, UA: NEGATIVE
Nitrite: NEGATIVE
PROTEIN: NEGATIVE mg/dL
Specific Gravity, Urine: 1.019 (ref 1.005–1.030)
Urobilinogen, UA: 0.2 mg/dL (ref 0.0–1.0)
pH: 7 (ref 5.0–8.0)

## 2014-03-01 LAB — BASIC METABOLIC PANEL
Anion gap: 11 (ref 5–15)
BUN: 22 mg/dL (ref 6–23)
CALCIUM: 9.4 mg/dL (ref 8.4–10.5)
CHLORIDE: 95 mmol/L — AB (ref 96–112)
CO2: 25 mmol/L (ref 19–32)
Creatinine, Ser: 1.16 mg/dL — ABNORMAL HIGH (ref 0.50–1.10)
GFR calc Af Amer: 52 mL/min — ABNORMAL LOW (ref >90)
GFR calc non Af Amer: 45 mL/min — ABNORMAL LOW (ref >90)
GLUCOSE: 119 mg/dL — AB (ref 70–99)
POTASSIUM: 4.3 mmol/L (ref 3.5–5.1)
Sodium: 131 mmol/L — ABNORMAL LOW (ref 135–145)

## 2014-03-01 LAB — CBC
HCT: 37 % (ref 36.0–46.0)
HEMOGLOBIN: 12.3 g/dL (ref 12.0–15.0)
MCH: 30.4 pg (ref 26.0–34.0)
MCHC: 33.2 g/dL (ref 30.0–36.0)
MCV: 91.4 fL (ref 78.0–100.0)
Platelets: 404 10*3/uL — ABNORMAL HIGH (ref 150–400)
RBC: 4.05 MIL/uL (ref 3.87–5.11)
RDW: 14.9 % (ref 11.5–15.5)
WBC: 9 10*3/uL (ref 4.0–10.5)

## 2014-03-01 LAB — BRAIN NATRIURETIC PEPTIDE: B Natriuretic Peptide: 84.7 pg/mL (ref 0.0–100.0)

## 2014-03-01 LAB — I-STAT TROPONIN, ED: Troponin i, poc: 0 ng/mL (ref 0.00–0.08)

## 2014-03-01 LAB — TROPONIN I

## 2014-03-01 MED ORDER — ONDANSETRON HCL 4 MG PO TABS: 4.0000 mg | ORAL_TABLET | Freq: Four times a day (QID) | ORAL | Status: DC | PRN

## 2014-03-01 MED ORDER — ACETAMINOPHEN 650 MG RE SUPP
650.0000 mg | Freq: Four times a day (QID) | RECTAL | Status: DC | PRN
Start: 2014-03-01 — End: 2014-03-02

## 2014-03-01 MED ORDER — OXYCODONE-ACETAMINOPHEN 5-325 MG PO TABS
1.0000 | ORAL_TABLET | Freq: Four times a day (QID) | ORAL | Status: DC | PRN
  Administered 2014-03-02: 1 via ORAL
  Filled 2014-03-01: qty 1

## 2014-03-01 MED ORDER — ALBUTEROL SULFATE (2.5 MG/3ML) 0.083% IN NEBU: 2.5000 mg | INHALATION_SOLUTION | RESPIRATORY_TRACT | Status: DC | PRN

## 2014-03-01 MED ORDER — DOCUSATE SODIUM 100 MG PO CAPS
100.0000 mg | ORAL_CAPSULE | Freq: Two times a day (BID) | ORAL | Status: DC
  Administered 2014-03-01 – 2014-03-02 (×2): 100 mg via ORAL
  Filled 2014-03-01 (×2): qty 1

## 2014-03-01 MED ORDER — MIRABEGRON ER 25 MG PO TB24
25.0000 mg | ORAL_TABLET | Freq: Every day | ORAL | Status: DC
  Administered 2014-03-02: 25 mg via ORAL
  Filled 2014-03-01: qty 1

## 2014-03-01 MED ORDER — MORPHINE SULFATE 2 MG/ML IJ SOLN: 1.0000 mg | INTRAMUSCULAR | Status: DC | PRN

## 2014-03-01 MED ORDER — METHYLPHENIDATE HCL 5 MG PO TABS
5.0000 mg | ORAL_TABLET | ORAL | Status: DC
  Administered 2014-03-02: 5 mg via ORAL
  Filled 2014-03-01: qty 1

## 2014-03-01 MED ORDER — POLYETHYLENE GLYCOL 3350 17 G PO PACK: 17.0000 g | PACK | Freq: Every day | ORAL | Status: DC | PRN

## 2014-03-01 MED ORDER — ONDANSETRON HCL 4 MG/2ML IJ SOLN: 4.0000 mg | Freq: Four times a day (QID) | INTRAMUSCULAR | Status: DC | PRN

## 2014-03-01 MED ORDER — SODIUM CHLORIDE 0.9 % IV BOLUS (SEPSIS)
1000.0000 mL | Freq: Once | INTRAVENOUS | Status: AC
  Administered 2014-03-01: 1000 mL via INTRAVENOUS

## 2014-03-01 MED ORDER — SODIUM CHLORIDE 0.9 % IJ SOLN
3.0000 mL | Freq: Two times a day (BID) | INTRAMUSCULAR | Status: DC
Start: 2014-03-01 — End: 2014-03-02
  Administered 2014-03-02: 3 mL via INTRAVENOUS

## 2014-03-01 MED ORDER — IOHEXOL 300 MG/ML  SOLN: 100.0000 mL | Freq: Once | INTRAMUSCULAR | Status: DC | PRN

## 2014-03-01 MED ORDER — IOHEXOL 350 MG/ML SOLN
100.0000 mL | Freq: Once | INTRAVENOUS | Status: AC | PRN
  Administered 2014-03-01: 100 mL via INTRAVENOUS

## 2014-03-01 MED ORDER — ACETAMINOPHEN 325 MG PO TABS: 650.0000 mg | ORAL_TABLET | Freq: Four times a day (QID) | ORAL | Status: DC | PRN

## 2014-03-01 MED ORDER — SODIUM CHLORIDE 0.9 % IV SOLN
INTRAVENOUS | Status: AC
  Administered 2014-03-01 – 2014-03-02 (×2): via INTRAVENOUS

## 2014-03-01 NOTE — ED Notes (Signed)
Bed: OI71 Expected date:  Expected time:  Means of arrival:  Comments: Chest pain triage

## 2014-03-01 NOTE — Progress Notes (Unsigned)
Ms. Cadenas daughter, [name], called me this afternoon asking what she should do about some changes in behavior she has noticed in her mother. Dhiya was scheduled for her radiation treatment earlier this morning. But she was having difficulty breathing and experiencing tightness in her chest. Ms. Mattie told her daughter that she wanted to go back to bed so the appointment was moved to later in the afternoon.     [name] helped her mother into the bath tub to get ready for her afternoon treatment appointment. When she went back in to help her out of the tub, her mother was unable to speak. She could not find the right words to say, and seemed confused. I asked [name] if this is a sudden change in her mother's behavior, she replied yes. I then instructed [name] to take her mother to the ED right away to access her mother's symptoms.   I have called the treatment machine to let them know that Yaritza will not be in for her radiation treatment this afternoon and aldo informed Dr. Pearlie Oyster nurse, Stanton Kidney about these happenings.   Mont Dutton R.T.(R).(T).

## 2014-03-01 NOTE — H&P (Signed)
[facility] History and Physical  Cheryl Nielsen QHU:[id number] DOB: 07-Jul-1940 DOA: 03/01/2014   PCP: Lujean Amel, MD  Specialists: Dr. Julien Nordmann is her oncologist. Dr. Isidore Moos is her radiation oncologist.  Chief Complaint: Chest pain and transient confusion  HPI: Cheryl Nielsen is a 74 y.o. female with a past medical history of metastatic lung cancer with brain metastases. She is undergoing chemotherapy as well as radiation treatment for the brain metastases. Patient was in her usual state of health till this morning when she got up and was getting out of bed to go to the bathroom when she started experiencing pain in the entire chest. Radiating to the back. She felt nauseous. She had an episode of emesis. She was short of breath. The pain is described as a sharp pain 10 out of 10 in intensity. Associated with dizziness. Denies any other radiation of the pain. Then she tells me that she's been having these symptoms on and off for the last few weeks. The pain appears to be positional. And then about noon time she had transient episode of confusion when she was saying words, which did not make any sense to the rest of the family. This lasted a few minutes and then resolved. Denies any focal weakness. Denies seizure type activity. Denies any fever or chills. No dysuria. Last bowel movement was 2 days ago.  Home Medications: Prior to Admission medications   Medication Sig Start Date End Date Taking? Authorizing Provider  acetaminophen (TYLENOL) 500 MG tablet Take 1,000 mg by mouth every 6 (six) hours as needed (For headache.).   Yes Historical Provider, MD  diphenoxylate-atropine (LOMOTIL) 2.5-0.025 MG per tablet Take 1 tablet by mouth 2 (two) times daily as needed for diarrhea or loose stools. 05/23/13  Yes Curt Bears, MD  glucosamine-chondroitin 500-400 MG tablet Take 1 tablet by mouth daily with lunch.    Yes Historical Provider, MD  lidocaine-prilocaine (EMLA) cream Apply 1 application  topically daily as needed (Applies to port-a-cath.). 05/10/13  Yes Curt Bears, MD  methylphenidate (RITALIN) 5 MG tablet Take 1 tablet (5 mg total) by mouth 2 (two) times daily. 02/25/14  Yes Eppie Gibson, MD  Multiple Vitamin (MULTIVITAMIN WITH MINERALS) TABS Take 1 tablet by mouth daily with lunch. She takes Dance movement psychotherapist Adult 50+.   Yes Historical Provider, MD  Omega-3 Fatty Acids (FISH OIL CONCENTRATE PO) Take 1 capsule by mouth every morning. Pt unsure of dose, takes daily   Yes Historical Provider, MD  ondansetron (ZOFRAN) 8 MG tablet Take 1 tablet (8 mg total) by mouth every 8 (eight) hours as needed for nausea or vomiting. 11/16/13  Yes Curt Bears, MD  oxyCODONE-acetaminophen (PERCOCET/ROXICET) 5-325 MG per tablet Take 1 tablet by mouth every 6 (six) hours as needed for severe pain. 02/21/14  Yes Curt Bears, MD  polyethylene glycol [facility] / GLYCOLAX) packet Take 17 g by mouth daily as needed.   Yes Historical Provider, MD  prochlorperazine (COMPAZINE) 10 MG tablet Take 1 tablet (10 mg total) by mouth every 6 (six) hours as needed for nausea or vomiting. 11/16/13  Yes Curt Bears, MD  senna (SENOKOT) 8.6 MG TABS tablet Take 1 tablet by mouth daily as needed for mild constipation.   Yes Historical Provider, MD  mirabegron ER (MYRBETRIQ) 25 MG TB24 tablet Take 25 mg by mouth daily.    Historical Provider, MD  temozolomide (TEMODAR) 250 MG capsule Take 1 capsule (250 mg total) by mouth daily for 5 days every 4 weeks. Patient not  taking: Reported on 03/01/2014 11/14/13   Curt Bears, MD    Allergies:  Allergies  Allergen Reactions  . Asa [Aspirin]     GI upset  . Codeine     syncope  . Tramadol Nausea And Vomiting    Past Medical History: Past Medical History  Diagnosis Date  . Pneumonia, organism unspecified   . Status post chemotherapy  10/11/2012     carboplatin  . S/P radiation therapy 08/24/2012-09/11/2012    Whole Brain  / 32.5 Gy in 13 fractions  . Lung  cancer 07/05/12    Left Mainstem Bronchus- Small Cell Carcinoma  . S/P radiation therapy  07/24/2012-08/10/2012      Mediastinum and Left Hilum / 35 Gy in 14 fractions      Past Surgical History  Procedure Laterality Date  . Nasal sinus surgery    . Video bronchoscopy Bilateral 07/05/2012    Procedure: VIDEO BRONCHOSCOPY WITHOUT FLUORO;  Surgeon: Tanda Rockers, MD;  Location: [facility];  Service: Cardiopulmonary;  Laterality: Bilateral;  . Portacath placement      Social History: She lives in [place] by herself, but she has 5 children who take care of her. She quit smoking 2 years ago when she was diagnosed with lung cancer. Denies any alcohol use. No illicit drug use. Independent with daily activities. Denies any weight loss.  Family History:  Family History  Problem Relation Age of Onset  . Heart disease Mother   . Heart disease Father     PVD  . Prostate cancer Father   . Lung cancer Mother 88    was a smoker  . Colon cancer Brother      Review of Systems - History obtained from the patient General ROS: positive for  - fatigue Psychological ROS: negative Ophthalmic ROS: negative ENT ROS: negative Allergy and Immunology ROS: negative Hematological and Lymphatic ROS: negative Endocrine ROS: negative Respiratory ROS: as in hpi Cardiovascular ROS: as in hpi Gastrointestinal ROS: no abdominal pain, change in bowel habits, or black or bloody stools Genito-Urinary ROS: no dysuria, trouble voiding, or hematuria Musculoskeletal ROS: negative Neurological ROS: as in hpi Dermatological ROS: negative  Physical Examination  Filed Vitals:   03/01/14 1508 03/01/14 1625 03/01/14 1740 03/01/14 1800  BP: 155/84 164/84 172/88 138/79  Pulse: 96  116 103  Temp:      TempSrc:      Resp: _0 SpO2: 100%  98% 98%    BP 138/79 mmHg  Pulse 103  Temp(Src) 97.6 F (36.4 C) (Oral)  Resp 20  SpO2 98%  General appearance: alert, cooperative, appears stated age and no  distress Head: Normocephalic, without obvious abnormality, atraumatic Eyes: conjunctivae/corneas clear. PERRL, EOM's intact. Throat: Dry mucous membrane Neck: no adenopathy, no carotid bruit, no JVD, supple, symmetrical, trachea midline and thyroid not enlarged, symmetric, no tenderness/mass/nodules Resp: clear to auscultation bilaterally Cardio: regular rate and rhythm, S1, S2 normal, no murmur, click, rub or gallop GI: soft, non-tender; bowel sounds normal; no masses,  no organomegaly Extremities: extremities normal, atraumatic, no cyanosis or edema Pulses: 2+ and symmetric Skin: Skin color, texture, turgor normal. No rashes or lesions Lymph nodes: Cervical, supraclavicular, and axillary nodes normal. Neurologic: Alert and oriented X 3, normal strength and tone. Normal symmetric reflexes. Normal coordination and gait  Laboratory Data: Results for orders placed or performed during the hospital encounter of 03/01/14 (from the past 48 hour(s))  Basic metabolic panel     Status: Abnormal  Collection Time: 03/01/14  2:02 PM  Result Value Ref Range   Sodium 131 (L) 135 - 145 mmol/L   Potassium 4.3 3.5 - 5.1 mmol/L   Chloride 95 (L) 96 - 112 mmol/L   CO2 25 19 - 32 mmol/L   Glucose, Bld 119 (H) 70 - 99 mg/dL   BUN 22 6 - 23 mg/dL   Creatinine, Ser 1.16 (H) 0.50 - 1.10 mg/dL   Calcium 9.4 8.4 - 10.5 mg/dL   GFR calc non Af Amer 45 (L) >90 mL/min   GFR calc Af Amer 52 (L) >90 mL/min    Comment: (NOTE) The eGFR has been calculated using the CKD EPI equation. This calculation has not been validated in all clinical situations. eGFR's persistently <90 mL/min signify possible Chronic Kidney Disease.    Anion gap 11 5 - 15  CBC     Status: Abnormal   Collection Time: 03/01/14  2:02 PM  Result Value Ref Range   WBC 9.0 4.0 - 10.5 K/uL   RBC 4.05 3.87 - 5.11 MIL/uL   Hemoglobin 12.3 12.0 - 15.0 g/dL   HCT 37.0 36.0 - 46.0 %   MCV 91.4 78.0 - 100.0 fL   MCH 30.4 26.0 - 34.0 pg   MCHC  33.2 30.0 - 36.0 g/dL   RDW 14.9 11.5 - 15.5 %   Platelets 404 (H) 150 - 400 K/uL  BNP (order ONLY if patient complains of dyspnea/SOB AND you have documented it for THIS visit)     Status: None   Collection Time: 03/01/14  2:02 PM  Result Value Ref Range   B Natriuretic Peptide 84.7 0.0 - 100.0 pg/mL  I-stat troponin, ED (not at [facility])     Status: None   Collection Time: 03/01/14  2:33 PM  Result Value Ref Range   Troponin i, poc 0.00 0.00 - 0.08 ng/mL   Comment 3            Comment: Due to the release kinetics of cTnI, a negative result within the first hours of the onset of symptoms does not rule out myocardial infarction with certainty. If myocardial infarction is still suspected, repeat the test at appropriate intervals.     Radiology Reports: Dg Chest 2 View  03/01/2014   CLINICAL DATA:  Chest pain that started yesterday. Shortness of breath. Patient currently undergoing radiation therapy. History lung cancer.  EXAM: CHEST  2 VIEW  COMPARISON:  Chest CT - 02/22/2014  FINDINGS: Grossly unchanged cardiac silhouette and mediastinal contours with left-sided volume loss and mild deviation of the cardiomediastinal structures to the left. Stable positioning of support apparatus. Grossly unchanged nodular heterogeneous airspace opacities within the left mid lung. The lungs remain hyperexpanded. No new focal airspace opacities. No pleural effusion or pneumothorax. No evidence of edema. No acute osseus abnormalities.  IMPRESSION: Grossly unchanged left mid lung heterogeneous opacities and volume loss compatible with provided history of lung cancer and undergoing radiation. No definite superimposed acute cardiopulmonary disease.   Electronically Signed   By: Sandi Mariscal M.D.   On: 03/01/2014 15:16   Ct Head Wo Contrast  03/01/2014   CLINICAL DATA:  74 year old with confusion. History of lung cancer with chest pain. History of intracranial metastatic disease.  EXAM: CT HEAD WITHOUT CONTRAST   TECHNIQUE: Contiguous axial images were obtained from the base of the skull through the vertex without contrast.  COMPARISON:  Brain MRI 02/12/2014  FINDINGS: There is diffuse low density throughout the periventricular and subcortical white  matter which is similar to the prior examination. Slightly asymmetric cerebral atrophy. No evidence for acute hemorrhage, large mass lesion, midline shift, hydrocephalus or large new infarct. Visualized sinuses are clear.  Patient has known calvarial metastatic lesions. Again noted is a 1.5 cm lucent lesion in the occipital bone. There is also a subtle bone lesion near the left middle cranial fossa.  IMPRESSION: No acute intracranial abnormality.  Patient has known multiple small brain metastasis which are not well appreciated on this examination. Evidence for osseous metastatic lesions.  Diffuse white matter disease is similar to the previous examination.   Electronically Signed   By: Markus Daft M.D.   On: 03/01/2014 16:23   Ct Angio Chest Aorta W/cm &/or Wo/cm  03/01/2014   CLINICAL DATA:  Shortness of breath with chest pain and diaphoresis common known history of carcinoma lung  EXAM: CT ANGIOGRAPHY CHEST WITH CONTRAST  TECHNIQUE: Multidetector CT imaging of the chest was performed using the standard protocol during bolus administration of intravenous contrast. Multiplanar CT image reconstructions and MIPs were obtained to evaluate the vascular anatomy.  CONTRAST:  19m OMNIPAQUE IOHEXOL 350 MG/ML SOLN  COMPARISON:  02/22/2014  FINDINGS: The lungs are well aerated bilaterally. Scarring is again noted in the right upper lobe. No other significant changes noted within the right lung. There are changes in the left upper lobe consistent with the given clinical history of lung carcinoma. The overall appearance is stable from the recent exam. A few small nodules are noted in the left lower lobe also stable from the prior exam. No sizable effusion or pneumothorax is noted.   Considerable lymphadenopathy is noted encasing the vascular structures just above the aortic arch. These changes are stable in appearance from the prior study. No significant hilar adenopathy is noted. The thoracic aorta shows no findings to suggest dissection or aneurysmal dilatation. Minimal atherosclerotic calcifications are seen. Although not optimized for pulmonary emboli evaluation no large central embolus is seen.  The upper abdomen there again noted changes consistent with hepatic metastatic disease. The dominant lesion in the right lobe is stable. There is suggestion of a smaller lesion in the lateral segment of left lobe of the liver posteriorly. Enlargement of the adrenal glands is noted bilaterally consistent with metastatic disease. Additionally large retrocrural lymph nodes are seen also consistent with localized metastatic disease. Sclerotic foci are again noted in the T8 and T9 vertebral bodies consistent with metastatic disease. Similar findings are noted in the L1, L3 and L4 vertebral bodies.  Review of the MIP images confirms the above findings.  IMPRESSION: No evidence of thoracic aortic dissection. No definitive pulmonary embolism is seen.  Changes consistent with the known history of left upper lobe lung carcinoma with metastatic disease to the mediastinal and retrocrural lymph nodes as well as multiple thoracic and lumbar vertebra, adrenal glands and liver. Multiple small nodules are again noted in the left lower lobe.  No acute abnormality is noted.   Electronically Signed   By: MInez CatalinaM.D.   On: 03/01/2014 16:27    Electrocardiogram: Sinus tachycardia at 110 bpm. Normal axis. RSR pattern. Intervals normal. Nonspecific T wave changes similar to previous EKG.  Problem List  Principal Problem:   Chest pain Active Problems:   Small cell lung cancer   Brain metastases   DNR (do not resuscitate) discussion   Transient confusion   Dehydration   Assessment: This is a  74year old Caucasian female who has metastatic lung cancer with brain metastases who  presents with the atypical chest pain and transient confusion. Her CT was negative for PE or dissection. EKG is nonischemic. Troponin is normal. It's quite possible that her pain is secondary to her cancer. She also had back pain and does have metastatic process in her vertebrae. Transient confusion could be related to her brain metastases. TIA is another possibility, but considering her comorbidities she's not a candidate for any kind of treatment or intervention. Seizure is another possibility but no tonic clonic activity was noted.  Plan: #1 chest pain: Most likely secondary to cancer. We will observe her overnight. Serial troponins. She is allergic to aspirin. Since we do not suspect this is cardiac in nature we will not place her on any antiplatelet agents. Utilize narcotics for pain.   #2 Transient confusion: Could be related to the brain metastases. It has resolved at this time. MRI brain was considered and discussed with the family and the patient. Patient hesitates having MRI since she had one just 2 weeks ago. Since MRI would not change management at this time, we will hold off. Continue to follow clinically. She does not have any focal neurological deficits currently. A UA is pending. EEG could be beneficial.  #3 Dehydration with mild acute renal failure and hyponatremia: She does report weakness which is generalized. We will give her IV fluids. Have her seen by PT and OT. Repeat labs in the morning.  #4 Small cell lung cancer with brain metastases: She is receiving chemotherapy and radiation treatment. She was seen by palliative care team recently and she is a DO NOT RESUSCITATE, which she confirmed to me. She can follow-up with her oncology team next week.   DVT Prophylaxis: SCDs Code Status: DO NOT RESUSCITATE Family Communication: Discussed with the patient and her sons  Disposition Plan: Observe to  telemetry   Further management decisions will depend on results of further testing and patient's response to treatment.   [facility]  [facility] Pager [phone]  If 7PM-7AM, please contact night-coverage [email or web address] Password [facility]  03/01/2014, 6:15 PM

## 2014-03-01 NOTE — ED Notes (Signed)
Spoke to pam...klj

## 2014-03-01 NOTE — ED Provider Notes (Signed)
CSN: [id number]     Arrival date & time 03/01/14  1353 History   First MD Initiated Contact with Patient 03/01/14 1500     Chief Complaint  Patient presents with  . Chest Pain     (Consider location/radiation/quality/duration/timing/severity/associated sxs/prior Treatment) HPI  74 year old female presents with chest pain became worse yesterday. Patient has been having chest pain for the past one month intermittently. It seems to be worse in the morning and then goes away as the day progresses. However over the last 24-48 hours the pain has been significantly worse. This morning around 9:30 family noted that she had severe chest pain, back pain, and was diaphoretic. She was clutching her chest at this time. Later in the afternoon couple hours ago she also seemed to be confused. She was saying normal words but they did not make any sense. This has resolved. The patient was not noted to be weak on one side or the other. Patient complains that her pain seems to go from her chest through to her back. The back pain is in between her scapula. No leg swelling. Patient states her pain is almost resolved at this time.  Past Medical History  Diagnosis Date  . Pneumonia, organism unspecified   . Status post chemotherapy  10/11/2012     carboplatin  . S/P radiation therapy 08/24/2012-09/11/2012    Whole Brain  / 32.5 Gy in 13 fractions  . 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 S/P radiation therapy  07/24/2012-08/10/2012      Mediastinum and Left Hilum / 35 Gy in 14 fractions     Past Surgical History  Procedure Laterality Date  . Nasal sinus surgery    . Video bronchoscopy Bilateral 07/05/2012    Procedure: VIDEO BRONCHOSCOPY WITHOUT FLUORO;  Surgeon: Tanda Rockers, MD;  Location: [facility];  Service: Cardiopulmonary;  Laterality: Bilateral;  . Portacath placement     Family History  Problem Relation Age of Onset  . Heart disease Mother   . Heart disease Father      PVD  . Prostate cancer Father   . Lung cancer Mother 30    was a smoker  . Colon cancer Brother    History  Substance Use Topics  . Smoking status: Former Smoker -- 1.00 packs/day for 40 years    Types: Cigarettes    Quit date: 04/18/2012  . Smokeless tobacco: Never Used  . Alcohol Use: No   OB History    No data available     Review of Systems  Constitutional: Positive for diaphoresis.  Respiratory: Positive for shortness of breath.   Cardiovascular: Positive for chest pain.  Gastrointestinal: Negative for vomiting.  Musculoskeletal: Positive for back pain.  Neurological: Positive for speech difficulty. Negative for weakness.  Psychiatric/Behavioral: Positive for confusion.  All other systems reviewed and are negative.     Allergies  Asa; Codeine; and Tramadol  Home Medications   Prior to Admission medications   Medication Sig Start Date End Date Taking? Authorizing Provider  acetaminophen (TYLENOL) 500 MG tablet Take 1,000 mg by mouth every 6 (six) hours as needed (For headache.).   Yes Historical Provider, MD  diphenoxylate-atropine (LOMOTIL) 2.5-0.025 MG per tablet Take 1 tablet by mouth 2 (two) times daily as needed for diarrhea or loose stools. 05/23/13  Yes Curt Bears, MD  glucosamine-chondroitin 500-400 MG tablet Take 1 tablet by mouth daily with lunch.    Yes Historical Provider, MD  lidocaine-prilocaine (  EMLA) cream Apply 1 application topically daily as needed (Applies to port-a-cath.). 05/10/13  Yes Curt Bears, MD  methylphenidate (RITALIN) 5 MG tablet Take 1 tablet (5 mg total) by mouth 2 (two) times daily. 02/25/14  Yes Eppie Gibson, MD  Multiple Vitamin (MULTIVITAMIN WITH MINERALS) TABS Take 1 tablet by mouth daily with lunch. She takes Dance movement psychotherapist Adult 50+.   Yes Historical Provider, MD  Omega-3 Fatty Acids (FISH OIL CONCENTRATE PO) Take 1 capsule by mouth every morning. Pt unsure of dose, takes daily   Yes Historical Provider, MD  ondansetron  (ZOFRAN) 8 MG tablet Take 1 tablet (8 mg total) by mouth every 8 (eight) hours as needed for nausea or vomiting. 11/16/13  Yes Curt Bears, MD  oxyCODONE-acetaminophen (PERCOCET/ROXICET) 5-325 MG per tablet Take 1 tablet by mouth every 6 (six) hours as needed for severe pain. 02/21/14  Yes Curt Bears, MD  polyethylene glycol [facility] / GLYCOLAX) packet Take 17 g by mouth daily as needed.   Yes Historical Provider, MD  prochlorperazine (COMPAZINE) 10 MG tablet Take 1 tablet (10 mg total) by mouth every 6 (six) hours as needed for nausea or vomiting. 11/16/13  Yes Curt Bears, MD  senna (SENOKOT) 8.6 MG TABS tablet Take 1 tablet by mouth daily as needed for mild constipation.   Yes Historical Provider, MD  mirabegron ER (MYRBETRIQ) 25 MG TB24 tablet Take 25 mg by mouth daily.    Historical Provider, MD  sulfamethoxazole-trimethoprim (BACTRIM DS,SEPTRA DS) 800-160 MG per tablet Take 1 tablet by mouth twice a day on Mondays and Thursdays Patient not taking: Reported on 03/01/2014 11/01/13   Adrena E Johnson, PA-C  temozolomide (TEMODAR) 250 MG capsule Take 1 capsule (250 mg total) by mouth daily for 5 days every 4 weeks. Patient not taking: Reported on 03/01/2014 11/14/13   Curt Bears, MD   BP 160/97 mmHg  Pulse 112  Temp(Src) 97.6 F (36.4 C) (Oral)  Resp 20  SpO2 100% Physical Exam  Constitutional: She is oriented to person, place, and time. She appears well-developed and well-nourished.  HENT:  Head: Normocephalic and atraumatic.  Right Ear: External ear normal.  Left Ear: External ear normal.  Nose: Nose normal.  Eyes: EOM are normal. Pupils are equal, round, and reactive to light. Right eye exhibits no discharge. Left eye exhibits no discharge.  Cardiovascular: Normal rate, regular rhythm and normal heart sounds.   Pulses:      Radial pulses are 2+ on the right side, and 2+ on the left side.  Pulmonary/Chest: Effort normal and breath sounds normal. She exhibits no  tenderness.  Abdominal: Soft. She exhibits no distension. There is no tenderness.  Musculoskeletal:       Cervical back: She exhibits no tenderness.       Thoracic back: She exhibits no tenderness.  Neurological: She is alert and oriented to person, place, and time.  CN 2-12 grossly intact. 5/5 strength in all 4 extremities. Grossly normal sensation  Skin: Skin is warm and dry.  Nursing note and vitals reviewed.   ED Course  Procedures (including critical care time) Labs Review Labs Reviewed  BASIC METABOLIC PANEL - Abnormal; Notable for the following:    Sodium 131 (*)    Chloride 95 (*)    Glucose, Bld 119 (*)    Creatinine, Ser 1.16 (*)    GFR calc non Af Amer 45 (*)    GFR calc Af Amer 52 (*)    All other components within normal limits  CBC - Abnormal; Notable for the following:    Platelets 404 (*)    All other components within normal limits  BRAIN NATRIURETIC PEPTIDE  URINALYSIS, ROUTINE W REFLEX MICROSCOPIC  I-STAT TROPOININ, ED    Imaging Review Dg Chest 2 View  03/01/2014   CLINICAL DATA:  Chest pain that started yesterday. Shortness of breath. Patient currently undergoing radiation therapy. History lung cancer.  EXAM: CHEST  2 VIEW  COMPARISON:  Chest CT - 02/22/2014  FINDINGS: Grossly unchanged cardiac silhouette and mediastinal contours with left-sided volume loss and mild deviation of the cardiomediastinal structures to the left. Stable positioning of support apparatus. Grossly unchanged nodular heterogeneous airspace opacities within the left mid lung. The lungs remain hyperexpanded. No new focal airspace opacities. No pleural effusion or pneumothorax. No evidence of edema. No acute osseus abnormalities.  IMPRESSION: Grossly unchanged left mid lung heterogeneous opacities and volume loss compatible with provided history of lung cancer and undergoing radiation. No definite superimposed acute cardiopulmonary disease.   Electronically Signed   By: Sandi Mariscal M.D.   On:  03/01/2014 15:16   Ct Head Wo Contrast  03/01/2014   CLINICAL DATA:  73 year old with confusion. History of lung cancer with chest pain. History of intracranial metastatic disease.  EXAM: CT HEAD WITHOUT CONTRAST  TECHNIQUE: Contiguous axial images were obtained from the base of the skull through the vertex without contrast.  COMPARISON:  Brain MRI 02/12/2014  FINDINGS: There is diffuse low density throughout the periventricular and subcortical white matter which is similar to the prior examination. Slightly asymmetric cerebral atrophy. No evidence for acute hemorrhage, large mass lesion, midline shift, hydrocephalus or large new infarct. Visualized sinuses are clear.  Patient has known calvarial metastatic lesions. Again noted is a 1.5 cm lucent lesion in the occipital bone. There is also a subtle bone lesion near the left middle cranial fossa.  IMPRESSION: No acute intracranial abnormality.  Patient has known multiple small brain metastasis which are not well appreciated on this examination. Evidence for osseous metastatic lesions.  Diffuse white matter disease is similar to the previous examination.   Electronically Signed   By: Markus Daft M.D.   On: 03/01/2014 16:23   Ct Angio Chest Aorta W/cm &/or Wo/cm  03/01/2014   CLINICAL DATA:  Shortness of breath with chest pain and diaphoresis common known history of carcinoma lung  EXAM: CT ANGIOGRAPHY CHEST WITH CONTRAST  TECHNIQUE: Multidetector CT imaging of the chest was performed using the standard protocol during bolus administration of intravenous contrast. Multiplanar CT image reconstructions and MIPs were obtained to evaluate the vascular anatomy.  CONTRAST:  138mL OMNIPAQUE IOHEXOL 350 MG/ML SOLN  COMPARISON:  02/22/2014  FINDINGS: The lungs are well aerated bilaterally. Scarring is again noted in the right upper lobe. No other significant changes noted within the right lung. There are changes in the left upper lobe consistent with the given clinical  history of lung carcinoma. The overall appearance is stable from the recent exam. A few small nodules are noted in the left lower lobe also stable from the prior exam. No sizable effusion or pneumothorax is noted.  Considerable lymphadenopathy is noted encasing the vascular structures just above the aortic arch. These changes are stable in appearance from the prior study. No significant hilar adenopathy is noted. The thoracic aorta shows no findings to suggest dissection or aneurysmal dilatation. Minimal atherosclerotic calcifications are seen. Although not optimized for pulmonary emboli evaluation no large central embolus is seen.  The upper abdomen there  again noted changes consistent with hepatic metastatic disease. The dominant lesion in the right lobe is stable. There is suggestion of a smaller lesion in the lateral segment of left lobe of the liver posteriorly. Enlargement of the adrenal glands is noted bilaterally consistent with metastatic disease. Additionally large retrocrural lymph nodes are seen also consistent with localized metastatic disease. Sclerotic foci are again noted in the T8 and T9 vertebral bodies consistent with metastatic disease. Similar findings are noted in the L1, L3 and L4 vertebral bodies.  Review of the MIP images confirms the above findings.  IMPRESSION: No evidence of thoracic aortic dissection. No definitive pulmonary embolism is seen.  Changes consistent with the known history of left upper lobe lung carcinoma with metastatic disease to the mediastinal and retrocrural lymph nodes as well as multiple thoracic and lumbar vertebra, adrenal glands and liver. Multiple small nodules are again noted in the left lower lobe.  No acute abnormality is noted.   Electronically Signed   By: Inez Catalina M.D.   On: 03/01/2014 16:27     EKG Interpretation   Date/Time:  Friday March 01 2014 14:17:40 EST Ventricular Rate:  110 PR Interval:  170 QRS Duration: 83 QT Interval:  364 QTC  Calculation: 492 R Axis:   -48 Text Interpretation:  Sinus tachycardia Right atrial enlargement RSR' in  V1 or V2, probably normal variant Inferior infarct, old no significant  change since Aug 2014 Confirmed by Regenia Skeeter  MD, [place] ([id number]) on 03/01/2014  3:03:01 PM      MDM   Final diagnoses:  Chest pain, unspecified chest pain type  Transient confusion    Given patient's hypertension and sharp chest pain going from chest to back, CT dissection protocol obtained. No obvious pulmonary embolism and no aortic dissection. EKG is unremarkable, however given patient's risk factors she will need observation for ACS rule out. Her neurologic exam is normal now though family is describing transient confusion. Her words were not slurred but she was not speaking correctly. Given her prior metastases to her brain she will likely need MRI as well. Admit to the hospitalist on telemetry.    Ephraim Hamburger, MD 03/01/14 [phone]

## 2014-03-01 NOTE — ED Notes (Signed)
Daughter states pt at 0930 was clutching chest c/o sob, back pain and nausea became diaphoretic and had dysarthria. Pt recalls event.

## 2014-03-01 NOTE — ED Notes (Signed)
Pt c/o chest pain that started yesterday morning, state pain has now moved to back. Pt c/o nausea. Pt is currently undergoing radiation, 4 x this week

## 2014-03-02 ENCOUNTER — Other Ambulatory Visit: Payer: Self-pay

## 2014-03-02 DIAGNOSIS — R079 Chest pain, unspecified: Secondary | ICD-10-CM | POA: Insufficient documentation

## 2014-03-02 LAB — COMPREHENSIVE METABOLIC PANEL
ALK PHOS: 104 U/L (ref 39–117)
ALT: 18 U/L (ref 0–35)
AST: 28 U/L (ref 0–37)
Albumin: 3.6 g/dL (ref 3.5–5.2)
Anion gap: 8 (ref 5–15)
BUN: 18 mg/dL (ref 6–23)
CALCIUM: 8.7 mg/dL (ref 8.4–10.5)
CO2: 26 mmol/L (ref 19–32)
Chloride: 98 mmol/L (ref 96–112)
Creatinine, Ser: 1.11 mg/dL — ABNORMAL HIGH (ref 0.50–1.10)
GFR calc Af Amer: 55 mL/min — ABNORMAL LOW (ref >90)
GFR calc non Af Amer: 48 mL/min — ABNORMAL LOW (ref >90)
GLUCOSE: 94 mg/dL (ref 70–99)
POTASSIUM: 3.4 mmol/L — AB (ref 3.5–5.1)
Sodium: 132 mmol/L — ABNORMAL LOW (ref 135–145)
TOTAL PROTEIN: 6.3 g/dL (ref 6.0–8.3)
Total Bilirubin: 0.3 mg/dL (ref 0.3–1.2)

## 2014-03-02 LAB — CBC
HCT: 33.5 % — ABNORMAL LOW (ref 36.0–46.0)
HEMOGLOBIN: 11 g/dL — AB (ref 12.0–15.0)
MCH: 29.9 pg (ref 26.0–34.0)
MCHC: 32.8 g/dL (ref 30.0–36.0)
MCV: 91 fL (ref 78.0–100.0)
PLATELETS: 359 10*3/uL (ref 150–400)
RBC: 3.68 MIL/uL — ABNORMAL LOW (ref 3.87–5.11)
RDW: 15.1 % (ref 11.5–15.5)
WBC: 9.9 10*3/uL (ref 4.0–10.5)

## 2014-03-02 LAB — TROPONIN I: Troponin I: <0.03 ng/mL (ref <0.031)

## 2014-03-02 MED ORDER — OXYCODONE-ACETAMINOPHEN 5-325 MG PO TABS: 1.0000 | ORAL_TABLET | Freq: Four times a day (QID) | ORAL | Status: DC | PRN

## 2014-03-02 MED ORDER — DOCUSATE SODIUM 100 MG PO CAPS: 100.0000 mg | ORAL_CAPSULE | Freq: Two times a day (BID) | ORAL | Status: DC

## 2014-03-02 MED ORDER — HYDRALAZINE HCL 20 MG/ML IJ SOLN
10.0000 mg | Freq: Once | INTRAMUSCULAR | Status: AC
  Administered 2014-03-02: 10 mg via INTRAVENOUS
  Filled 2014-03-02: qty 1

## 2014-03-02 MED ORDER — METOPROLOL TARTRATE 25 MG PO TABS: 25.0000 mg | ORAL_TABLET | Freq: Two times a day (BID) | ORAL | Status: DC

## 2014-03-02 MED ORDER — METOPROLOL TARTRATE 1 MG/ML IV SOLN: 5.0000 mg | Freq: Once | INTRAVENOUS | Status: DC

## 2014-03-02 MED ORDER — METOPROLOL TARTRATE 1 MG/ML IV SOLN
5.0000 mg | Freq: Once | INTRAVENOUS | Status: AC
  Administered 2014-03-02: 5 mg via INTRAVENOUS
  Filled 2014-03-02: qty 5

## 2014-03-02 MED ORDER — METOPROLOL TARTRATE 25 MG PO TABS
25.0000 mg | ORAL_TABLET | Freq: Two times a day (BID) | ORAL | Status: DC
  Administered 2014-03-02: 25 mg via ORAL
  Filled 2014-03-02: qty 1

## 2014-03-02 MED ORDER — NITROGLYCERIN 0.4 MG SL SUBL: 0.4000 mg | SUBLINGUAL_TABLET | SUBLINGUAL | Status: DC | PRN

## 2014-03-02 NOTE — Discharge Summary (Addendum)
[facility]  Physician Discharge Summary   Patient ID: BEXLEE BERGDOLL MRN: [id number] DOB/AGE: January 21, 1940 74 y.o.  Admit date: 03/01/2014 Discharge date: 03/02/2014  PCP: Lujean Amel, MD  DISCHARGE DIAGNOSES:  Principal Problem:   Chest pain Active Problems:   Small cell lung cancer   Brain metastases   DNR (do not resuscitate) discussion   Transient confusion   Dehydration   Pain in the chest   RECOMMENDATIONS FOR OUTPATIENT FOLLOW UP: 1. Follow-up with oncology 2. Ritalin has been discontinued due to new onset hypertension 3. May be able to get off of her metoprolol if blood pressure improves off of Ritalin 4. If she has further episodes of confusion, Consider EEG  DISCHARGE CONDITION: fair  Diet recommendation: Regular as tolerated  Filed Weights   03/01/14 1846  Weight: 48.4 kg (106 lb 11.2 oz)    INITIAL HISTORY: 74 year old Caucasian female with metastatic lung cancer with brain metastases, undergoing chemotherapy and radiation therapy. She came in with the complaints of chest pain as well as transient confusion. She was admitted for further evaluation.  Consultations:  None  Procedures:  None  HOSPITAL COURSE:   Chest pain This has been ongoing for many months. Troponins were normal. CT was negative for PE. EKG did not show any new changes. Most likely her pain is secondary to cancer. She has been asked to medicate herself before the pain gets worse. She was asked to take stool softener whenever she would take narcotics.  Elevated blood pressure Patient was started on Ritalin recently for fatigue. She's never had high blood pressure before. She was found to be hypertensive in the hospital with systolics in the 010X. She was given doses of hydralazine and metoprolol with improvement. She was asked to discontinue her Ritalin as elevated BP is a known side effect. She was given a prescription for metoprolol and asked to check her blood pressure  frequently. As blood pressure gets controlled on its own off of Ritalin, metoprolol could be discontinued.  Transient confusion Could be related to the brain metastases. It has resolved at this time. MRI brain was considered and discussed with the family and the patient. Patient hesitates having MRI since she had one just 2 weeks ago. Since MRI would not change management at this time, we will hold off. She did not have any further episodes. Even though she did not have any generalized tonic-clonic activity, seizure could be a possibility considering her mets. If she has recurrence of her symptoms EEG could be considered as an outpatient. UA was negative for infection.  Dehydration with mild acute renal failure and hyponatremia Was gently hydrated. There was some improvement in her renal function and sodium level.   Small cell lung cancer with brain metastases She is receiving chemotherapy and radiation treatment. She was seen by palliative care team recently and she is a DO NOT RESUSCITATE, which she confirmed to me. She can follow-up with her oncology team next week.  Long discussion with the patient and family regarding regarding use of narcotic medications. She hesitates using pain medicine. She was told that she should not hesitate and use the medicines before the pain gets worse. I also reassured her that she should not be worried about possible dependency on these medicines.   I also discussed with the patient and the family regarding her high blood pressure. It was my recommendation to discontinue the Ritalin. They were asked to check blood pressures closely and discuss discontinuation of metoprolol if blood pressure  comes down with discontinuation of Ritalin.   PERTINENT LABS:  The results of significant diagnostics from this hospitalization (including imaging, microbiology, ancillary and laboratory) are listed below for reference.     Labs: Basic Metabolic Panel:  Recent Labs Lab  03/01/14 1402 03/02/14 0127  NA 131* 132*  K 4.3 3.4*  CL 95* 98  CO2 25 26  GLUCOSE 119* 94  BUN 22 18  CREATININE 1.16* 1.11*  CALCIUM 9.4 8.7   Liver Function Tests:  Recent Labs Lab 03/02/14 0127  AST 28  ALT 18  ALKPHOS 104  BILITOT 0.3  PROT 6.3  ALBUMIN 3.6   CBC:  Recent Labs Lab 03/01/14 1402 03/02/14 0127  WBC 9.0 9.9  HGB 12.3 11.0*  HCT 37.0 33.5*  MCV 91.4 91.0  PLT 404* 359   Cardiac Enzymes:  Recent Labs Lab 03/01/14 1954 03/02/14 0127 03/02/14 0709  TROPONINI <0.03 <0.03 <0.03   BNP: BNP (last 3 results)  Recent Labs  03/01/14 1402  BNP 84.7    IMAGING STUDIES Dg Chest 2 View  03/01/2014   CLINICAL DATA:  Chest pain that started yesterday. Shortness of breath. Patient currently undergoing radiation therapy. History lung cancer.  EXAM: CHEST  2 VIEW  COMPARISON:  Chest CT - 02/22/2014  FINDINGS: Grossly unchanged cardiac silhouette and mediastinal contours with left-sided volume loss and mild deviation of the cardiomediastinal structures to the left. Stable positioning of support apparatus. Grossly unchanged nodular heterogeneous airspace opacities within the left mid lung. The lungs remain hyperexpanded. No new focal airspace opacities. No pleural effusion or pneumothorax. No evidence of edema. No acute osseus abnormalities.  IMPRESSION: Grossly unchanged left mid lung heterogeneous opacities and volume loss compatible with provided history of lung cancer and undergoing radiation. No definite superimposed acute cardiopulmonary disease.   Electronically Signed   By: Sandi Mariscal M.D.   On: 03/01/2014 15:16   Ct Head Wo Contrast  03/01/2014   CLINICAL DATA:  74 year old with confusion. History of lung cancer with chest pain. History of intracranial metastatic disease.  EXAM: CT HEAD WITHOUT CONTRAST  TECHNIQUE: Contiguous axial images were obtained from the base of the skull through the vertex without contrast.  COMPARISON:  Brain MRI 02/12/2014   FINDINGS: There is diffuse low density throughout the periventricular and subcortical white matter which is similar to the prior examination. Slightly asymmetric cerebral atrophy. No evidence for acute hemorrhage, large mass lesion, midline shift, hydrocephalus or large new infarct. Visualized sinuses are clear.  Patient has known calvarial metastatic lesions. Again noted is a 1.5 cm lucent lesion in the occipital bone. There is also a subtle bone lesion near the left middle cranial fossa.  IMPRESSION: No acute intracranial abnormality.  Patient has known multiple small brain metastasis which are not well appreciated on this examination. Evidence for osseous metastatic lesions.  Diffuse white matter disease is similar to the previous examination.   Electronically Signed   By: Markus Daft M.D.   On: 03/01/2014 16:23    Ct Angio Chest Aorta W/cm &/or Wo/cm  03/01/2014   CLINICAL DATA:  Shortness of breath with chest pain and diaphoresis common known history of carcinoma lung  EXAM: CT ANGIOGRAPHY CHEST WITH CONTRAST  TECHNIQUE: Multidetector CT imaging of the chest was performed using the standard protocol during bolus administration of intravenous contrast. Multiplanar CT image reconstructions and MIPs were obtained to evaluate the vascular anatomy.  CONTRAST:  146mL OMNIPAQUE IOHEXOL 350 MG/ML SOLN  COMPARISON:  02/22/2014  FINDINGS: The  lungs are well aerated bilaterally. Scarring is again noted in the right upper lobe. No other significant changes noted within the right lung. There are changes in the left upper lobe consistent with the given clinical history of lung carcinoma. The overall appearance is stable from the recent exam. A few small nodules are noted in the left lower lobe also stable from the prior exam. No sizable effusion or pneumothorax is noted.  Considerable lymphadenopathy is noted encasing the vascular structures just above the aortic arch. These changes are stable in appearance from the  prior study. No significant hilar adenopathy is noted. The thoracic aorta shows no findings to suggest dissection or aneurysmal dilatation. Minimal atherosclerotic calcifications are seen. Although not optimized for pulmonary emboli evaluation no large central embolus is seen.  The upper abdomen there again noted changes consistent with hepatic metastatic disease. The dominant lesion in the right lobe is stable. There is suggestion of a smaller lesion in the lateral segment of left lobe of the liver posteriorly. Enlargement of the adrenal glands is noted bilaterally consistent with metastatic disease. Additionally large retrocrural lymph nodes are seen also consistent with localized metastatic disease. Sclerotic foci are again noted in the T8 and T9 vertebral bodies consistent with metastatic disease. Similar findings are noted in the L1, L3 and L4 vertebral bodies.  Review of the MIP images confirms the above findings.  IMPRESSION: No evidence of thoracic aortic dissection. No definitive pulmonary embolism is seen.  Changes consistent with the known history of left upper lobe lung carcinoma with metastatic disease to the mediastinal and retrocrural lymph nodes as well as multiple thoracic and lumbar vertebra, adrenal glands and liver. Multiple small nodules are again noted in the left lower lobe.  No acute abnormality is noted.   Electronically Signed   By: Inez Catalina M.D.   On: 03/01/2014 16:27    DISCHARGE EXAMINATION: Filed Vitals:   03/02/14 0529 03/02/14 0653 03/02/14 1019 03/02/14 1302  BP:  168/101 178/110 135/70  Pulse: 101 83 83 76  Temp: 97.5 F (36.4 C)     TempSrc: Oral     Resp:      Height:      Weight:      SpO2: 99%      General appearance: alert, cooperative, appears stated age and no distress Resp: clear to auscultation bilaterally Cardio: regular rate and rhythm, S1, S2 normal, no murmur, click, rub or gallop GI: soft, non-tender; bowel sounds normal; no masses,  no  organomegaly  DISPOSITION: Home with family  Discharge Instructions    Call MD for:  difficulty breathing, headache or visual disturbances    Complete by:  As directed      Call MD for:  extreme fatigue    Complete by:  As directed      Call MD for:  persistant dizziness or light-headedness    Complete by:  As directed      Call MD for:  severe uncontrolled pain    Complete by:  As directed      Call MD for:  temperature >100.4    Complete by:  As directed      Diet general    Complete by:  As directed      Discharge instructions    Complete by:  As directed   Please stop taking Ritalin as it is the most likely reason for your high BP. BP should be checked frequently at home. If the upper BP (systolic) is less than  110, do not take the BP medication. Take pain medication before pain gets worse. Take stool softeners as instructed as pain meds can cause constipation. Your confusion could have been due to high BP but there could be another reason. If your confusion recurs you should talk to your doctor about getting an EEG (Electro-encephalogram) done as outpatient. The brain tumors can predispose you to seizures which can result in confusion as well.     Increase activity slowly    Complete by:  As directed            ALLERGIES:  Allergies  Allergen Reactions  . Asa [Aspirin]     GI upset  . Codeine     syncope  . Tramadol Nausea And Vomiting     Discharge Medication List as of 03/02/2014  1:30 PM    START taking these medications   Details  docusate sodium (COLACE) 100 MG capsule Take 1 capsule (100 mg total) by mouth 2 (two) times daily. If taking pain medications daily. Otherwise take as needed for constipation, Starting 03/02/2014, Until Discontinued, Print    metoprolol tartrate (LOPRESSOR) 25 MG tablet Take 1 tablet (25 mg total) by mouth 2 (two) times daily., Starting 03/02/2014, Until Discontinued, Print      CONTINUE these medications which have CHANGED   Details    oxyCODONE-acetaminophen (PERCOCET/ROXICET) 5-325 MG per tablet Take 1-2 tablets by mouth every 6 (six) hours as needed for severe pain., Starting 03/02/2014, Until Discontinued, Print      CONTINUE these medications which have NOT CHANGED   Details  acetaminophen (TYLENOL) 500 MG tablet Take 1,000 mg by mouth every 6 (six) hours as needed (For headache.)., Until Discontinued, Historical Med    diphenoxylate-atropine (LOMOTIL) 2.5-0.025 MG per tablet Take 1 tablet by mouth 2 (two) times daily as needed for diarrhea or loose stools., Starting 05/23/2013, Until Discontinued, Phone In    glucosamine-chondroitin 500-400 MG tablet Take 1 tablet by mouth daily with lunch. , Until Discontinued, Historical Med    lidocaine-prilocaine (EMLA) cream Apply 1 application topically daily as needed (Applies to port-a-cath.)., Starting 05/10/2013, Until Discontinued, Normal    Multiple Vitamin (MULTIVITAMIN WITH MINERALS) TABS Take 1 tablet by mouth daily with lunch. She takes Dance movement psychotherapist Adult 50+., Until Discontinued, Historical Med    Omega-3 Fatty Acids (FISH OIL CONCENTRATE PO) Take 1 capsule by mouth every morning. Pt unsure of dose, takes daily, Until Discontinued, Historical Med    ondansetron (ZOFRAN) 8 MG tablet Take 1 tablet (8 mg total) by mouth every 8 (eight) hours as needed for nausea or vomiting., Starting 11/16/2013, Until Discontinued, Normal    polyethylene glycol (MIRALAX / GLYCOLAX) packet Take 17 g by mouth daily as needed., Until Discontinued, Historical Med    prochlorperazine (COMPAZINE) 10 MG tablet Take 1 tablet (10 mg total) by mouth every 6 (six) hours as needed for nausea or vomiting., Starting 11/16/2013, Until Discontinued, Normal    senna (SENOKOT) 8.6 MG TABS tablet Take 1 tablet by mouth daily as needed for mild constipation., Until Discontinued, Historical Med    mirabegron ER (MYRBETRIQ) 25 MG TB24 tablet Take 25 mg by mouth daily., Until Discontinued, Historical Med     temozolomide (TEMODAR) 250 MG capsule Take 1 capsule (250 mg total) by mouth daily for 5 days every 4 weeks., Fax      STOP taking these medications     methylphenidate (RITALIN) 5 MG tablet        Follow-up Information  Follow up with Lujean Amel, MD.   Specialty:  Family Medicine   Why:  As needed   Contact information:   [address] [place] [address] [phone]       Follow up with Eilleen Kempf., MD.   Specialty:  Oncology   Why:  post hospitalization follow up   Contact information:   [place] [address] [place]: [address]  Mashal Slavick  [facility] Pager [phone]  03/02/2014, 2:39 PM

## 2014-03-02 NOTE — Progress Notes (Signed)
Pt with elevated BP as well as tachycardia with activity. Also when ambulated to the bathroom this am pt states that her CP has returned and is at an 8/10. Percocet given and pt assisted back to bed. HR returned to baseline of NSR-ST in 90-100s. CP resolved quickly with pt returning to bed. BP currently 189/101. NP on call paged and orders received. Will continue to monitor

## 2014-03-02 NOTE — Plan of Care (Signed)
Problem: Phase I Progression Outcomes Goal: Aspirin unless contraindicated Outcome: Not Applicable Date Met:  03/02/14 Pt with allergy to asa     

## 2014-03-02 NOTE — Progress Notes (Signed)
Utilization Review completed.  

## 2014-03-03 ENCOUNTER — Encounter (HOSPITAL_COMMUNITY): Payer: Self-pay | Admitting: Emergency Medicine

## 2014-03-03 ENCOUNTER — Emergency Department (HOSPITAL_COMMUNITY): Payer: Medicare Other

## 2014-03-03 ENCOUNTER — Inpatient Hospital Stay (HOSPITAL_COMMUNITY)
Admission: EM | Admit: 2014-03-03 | Discharge: 2014-03-09 | DRG: 054 | Disposition: A | Discharge status: Skilled Nursing Facility | Payer: Medicare Other | Attending: Internal Medicine | Admitting: Internal Medicine

## 2014-03-03 ENCOUNTER — Other Ambulatory Visit: Payer: Self-pay | Admitting: Internal Medicine

## 2014-03-03 DIAGNOSIS — Z515 Encounter for palliative care: Secondary | ICD-10-CM

## 2014-03-03 DIAGNOSIS — R531 Weakness: Secondary | ICD-10-CM

## 2014-03-03 DIAGNOSIS — E222 Syndrome of inappropriate secretion of antidiuretic hormone: Secondary | ICD-10-CM | POA: Diagnosis present

## 2014-03-03 DIAGNOSIS — C799 Secondary malignant neoplasm of unspecified site: Secondary | ICD-10-CM

## 2014-03-03 DIAGNOSIS — J189 Pneumonia, unspecified organism: Secondary | ICD-10-CM | POA: Diagnosis not present

## 2014-03-03 DIAGNOSIS — I638 Other cerebral infarction: Secondary | ICD-10-CM | POA: Diagnosis present

## 2014-03-03 DIAGNOSIS — C3492 Malignant neoplasm of unspecified part of left bronchus or lung: Secondary | ICD-10-CM

## 2014-03-03 DIAGNOSIS — G893 Neoplasm related pain (acute) (chronic): Secondary | ICD-10-CM | POA: Diagnosis present

## 2014-03-03 DIAGNOSIS — C7931 Secondary malignant neoplasm of brain: Secondary | ICD-10-CM

## 2014-03-03 DIAGNOSIS — E861 Hypovolemia: Secondary | ICD-10-CM | POA: Diagnosis present

## 2014-03-03 DIAGNOSIS — N179 Acute kidney failure, unspecified: Secondary | ICD-10-CM | POA: Diagnosis present

## 2014-03-03 DIAGNOSIS — E860 Dehydration: Secondary | ICD-10-CM | POA: Diagnosis present

## 2014-03-03 DIAGNOSIS — C7949 Secondary malignant neoplasm of other parts of nervous system: Secondary | ICD-10-CM

## 2014-03-03 DIAGNOSIS — Z9221 Personal history of antineoplastic chemotherapy: Secondary | ICD-10-CM

## 2014-03-03 DIAGNOSIS — I639 Cerebral infarction, unspecified: Secondary | ICD-10-CM

## 2014-03-03 DIAGNOSIS — Z681 Body mass index (BMI) 19 or less, adult: Secondary | ICD-10-CM

## 2014-03-03 DIAGNOSIS — D630 Anemia in neoplastic disease: Secondary | ICD-10-CM | POA: Diagnosis present

## 2014-03-03 DIAGNOSIS — T380X5A Adverse effect of glucocorticoids and synthetic analogues, initial encounter: Secondary | ICD-10-CM | POA: Diagnosis present

## 2014-03-03 DIAGNOSIS — Z8701 Personal history of pneumonia (recurrent): Secondary | ICD-10-CM

## 2014-03-03 DIAGNOSIS — C787 Secondary malignant neoplasm of liver and intrahepatic bile duct: Secondary | ICD-10-CM | POA: Diagnosis present

## 2014-03-03 DIAGNOSIS — R0600 Dyspnea, unspecified: Secondary | ICD-10-CM

## 2014-03-03 DIAGNOSIS — I10 Essential (primary) hypertension: Secondary | ICD-10-CM | POA: Diagnosis present

## 2014-03-03 DIAGNOSIS — D72829 Elevated white blood cell count, unspecified: Secondary | ICD-10-CM | POA: Diagnosis present

## 2014-03-03 DIAGNOSIS — Z87891 Personal history of nicotine dependence: Secondary | ICD-10-CM

## 2014-03-03 DIAGNOSIS — G9340 Encephalopathy, unspecified: Secondary | ICD-10-CM | POA: Diagnosis present

## 2014-03-03 DIAGNOSIS — Z801 Family history of malignant neoplasm of trachea, bronchus and lung: Secondary | ICD-10-CM

## 2014-03-03 DIAGNOSIS — C3490 Malignant neoplasm of unspecified part of unspecified bronchus or lung: Secondary | ICD-10-CM | POA: Diagnosis present

## 2014-03-03 DIAGNOSIS — C7951 Secondary malignant neoplasm of bone: Secondary | ICD-10-CM | POA: Diagnosis present

## 2014-03-03 DIAGNOSIS — Z923 Personal history of irradiation: Secondary | ICD-10-CM

## 2014-03-03 DIAGNOSIS — Z8042 Family history of malignant neoplasm of prostate: Secondary | ICD-10-CM

## 2014-03-03 DIAGNOSIS — Z66 Do not resuscitate: Secondary | ICD-10-CM | POA: Insufficient documentation

## 2014-03-03 DIAGNOSIS — C7972 Secondary malignant neoplasm of left adrenal gland: Secondary | ICD-10-CM | POA: Diagnosis present

## 2014-03-03 DIAGNOSIS — E43 Unspecified severe protein-calorie malnutrition: Secondary | ICD-10-CM | POA: Diagnosis present

## 2014-03-03 DIAGNOSIS — R5381 Other malaise: Secondary | ICD-10-CM | POA: Diagnosis present

## 2014-03-03 DIAGNOSIS — C7971 Secondary malignant neoplasm of right adrenal gland: Secondary | ICD-10-CM | POA: Diagnosis present

## 2014-03-03 DIAGNOSIS — E871 Hypo-osmolality and hyponatremia: Secondary | ICD-10-CM | POA: Insufficient documentation

## 2014-03-03 HISTORY — DX: Cerebral infarction, unspecified: I63.9

## 2014-03-03 LAB — COMPREHENSIVE METABOLIC PANEL
ALK PHOS: 109 U/L (ref 39–117)
ALT: 46 U/L — AB (ref 0–35)
ANION GAP: 12 (ref 5–15)
AST: 30 U/L (ref 0–37)
Albumin: 4.1 g/dL (ref 3.5–5.2)
BILIRUBIN TOTAL: 0.7 mg/dL (ref 0.3–1.2)
BUN: 24 mg/dL — AB (ref 6–23)
CALCIUM: 9.8 mg/dL (ref 8.4–10.5)
CHLORIDE: 92 mmol/L — AB (ref 96–112)
CO2: 25 mmol/L (ref 19–32)
Creatinine, Ser: 1 mg/dL (ref 0.50–1.10)
GFR calc Af Amer: 63 mL/min — ABNORMAL LOW (ref >90)
GFR, EST NON AFRICAN AMERICAN: 54 mL/min — AB (ref >90)
Glucose, Bld: 132 mg/dL — ABNORMAL HIGH (ref 70–99)
POTASSIUM: 3.6 mmol/L (ref 3.5–5.1)
Sodium: 129 mmol/L — ABNORMAL LOW (ref 135–145)
TOTAL PROTEIN: 7.3 g/dL (ref 6.0–8.3)

## 2014-03-03 LAB — CBC WITH DIFFERENTIAL/PLATELET
Basophils Absolute: 0 10*3/uL (ref 0.0–0.1)
Basophils Relative: 0 % (ref 0–1)
Eosinophils Absolute: 0 10*3/uL (ref 0.0–0.7)
Eosinophils Relative: 0 % (ref 0–5)
HEMATOCRIT: 37.2 % (ref 36.0–46.0)
Hemoglobin: 12.8 g/dL (ref 12.0–15.0)
LYMPHS PCT: 6 % — AB (ref 12–46)
Lymphs Abs: 0.4 10*3/uL — ABNORMAL LOW (ref 0.7–4.0)
MCH: 30.5 pg (ref 26.0–34.0)
MCHC: 34.4 g/dL (ref 30.0–36.0)
MCV: 88.8 fL (ref 78.0–100.0)
Monocytes Absolute: 0.2 10*3/uL (ref 0.1–1.0)
Monocytes Relative: 2 % — ABNORMAL LOW (ref 3–12)
NEUTROS PCT: 92 % — AB (ref 43–77)
Neutro Abs: 6.1 10*3/uL (ref 1.7–7.7)
Platelets: 391 10*3/uL (ref 150–400)
RBC: 4.19 MIL/uL (ref 3.87–5.11)
RDW: 14.8 % (ref 11.5–15.5)
WBC: 6.6 10*3/uL (ref 4.0–10.5)

## 2014-03-03 LAB — I-STAT TROPONIN, ED: TROPONIN I, POC: 0.01 ng/mL (ref 0.00–0.08)

## 2014-03-03 LAB — I-STAT CG4 LACTIC ACID, ED: LACTIC ACID, VENOUS: 0.94 mmol/L (ref 0.5–2.0)

## 2014-03-03 LAB — AMMONIA: Ammonia: 13 umol/L (ref 11–32)

## 2014-03-03 MED ORDER — DEXAMETHASONE SODIUM PHOSPHATE 10 MG/ML IJ SOLN
10.0000 mg | Freq: Once | INTRAMUSCULAR | Status: AC
  Administered 2014-03-03: 10 mg via INTRAVENOUS
  Filled 2014-03-03: qty 1

## 2014-03-03 MED ORDER — SODIUM CHLORIDE 0.9 % IV BOLUS (SEPSIS)
1000.0000 mL | Freq: Once | INTRAVENOUS | Status: AC
  Administered 2014-03-03: 1000 mL via INTRAVENOUS

## 2014-03-03 MED ORDER — IOHEXOL 300 MG/ML  SOLN
80.0000 mL | Freq: Once | INTRAMUSCULAR | Status: AC | PRN
  Administered 2014-03-03: 80 mL via INTRAVENOUS

## 2014-03-03 MED ORDER — SODIUM CHLORIDE 0.9 % IV SOLN
Freq: Once | INTRAVENOUS | Status: AC
  Administered 2014-03-04: 1000 mL via INTRAVENOUS

## 2014-03-03 MED ORDER — DEXAMETHASONE 4 MG PO TABS: 4.0000 mg | ORAL_TABLET | Freq: Three times a day (TID) | ORAL | Status: DC

## 2014-03-03 NOTE — ED Notes (Signed)
Pt arrived via EMS with report of generalized weakness, fatigue, nausea, poor appetite, and change noted in pt's condition. Pt normally OOB eating and walking.

## 2014-03-03 NOTE — ED Notes (Signed)
Patient transported to CT 

## 2014-03-03 NOTE — ED Notes (Signed)
Bed: [facility]70 Expected date:  Expected time:  Means of arrival:  Comments: EMS dec LOC, hx of lung and brain CA

## 2014-03-03 NOTE — ED Provider Notes (Signed)
CSN: [id number]     Arrival date & time 03/03/14  2143 History   First MD Initiated Contact with Patient 03/03/14 2151     Chief Complaint  Patient presents with  . Fatigue     (Consider location/radiation/quality/duration/timing/severity/associated sxs/prior Treatment) HPI Cheryl Nielsen is a 74 y.o. 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 Patient apparently was admitted 2 days ago for dysphagia and chest pain. She had extensive workup including CT angio chest, serial enzymes, CT of the brain. All came back unremarkable except for a patient's known metastatic cancer. She was discharged home yesterday. Family state that this morning patient seemed weak. Since then her symptoms have gradually worsened over the day. They called her oncologist earlier today and they were called in some steroids. They were told that she may have some "swelling on the brain from the radiation." She took a dose of prednisone this morning. She was posted take another dose tonight, but family states her condition has worsened to the point where she is unable to take anything by mouth. She is unable to get out of bed or even sit up in bed. She is unable to speak except for yes and no due to weakness. Family denies any fever. There is no cough or congestion. Patient denies any pain anywhere. States she just feels weak.  Past Medical History  Diagnosis Date  . Pneumonia, organism unspecified   . Status post chemotherapy  10/11/2012     carboplatin  . S/P radiation therapy 08/24/2012-09/11/2012    Whole Brain  / 32.5 Gy in 13 fractions  . 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 S/P radiation therapy  07/24/2012-08/10/2012      Mediastinum and Left Hilum / 35 Gy in 14 fractions     Past Surgical History  Procedure Laterality Date  . Nasal sinus surgery    . Video bronchoscopy Bilateral 07/05/2012     Procedure: VIDEO BRONCHOSCOPY WITHOUT FLUORO;  Surgeon: Tanda Rockers, MD;  Location: [facility];  Service: Cardiopulmonary;  Laterality: Bilateral;  . Portacath placement     Family History  Problem Relation Age of Onset  . Heart disease Mother   . Heart disease Father     PVD  . Prostate cancer Father   . Lung cancer Mother 38    was a smoker  . Colon cancer Brother    History  Substance Use Topics  . Smoking status: Former Smoker -- 1.00 packs/day for 40 years    Types: Cigarettes    Quit date: 04/18/2012  . Smokeless tobacco: Never Used  . Alcohol Use: No   OB History    No data available     Review of Systems  Unable to perform ROS: Mental status change  All other systems reviewed and are negative.     Allergies  Asa; Codeine; and Tramadol  Home Medications   Prior to Admission medications   Medication Sig Start Date End Date Taking? Authorizing Provider  acetaminophen (TYLENOL) 500 MG tablet Take 1,000 mg by mouth every 6 (six) hours as needed (For headache.).    Historical Provider, MD  dexamethasone (DECADRON) 4 MG tablet Take 1 tablet (4 mg total) by mouth 3 (three) times daily. 03/03/14   Curt Bears, MD  diphenoxylate-atropine (LOMOTIL) 2.5-0.025 MG per tablet Take 1 tablet by mouth 2 (two) times daily as needed for diarrhea or  loose stools. 05/23/13   Curt Bears, MD  docusate sodium (COLACE) 100 MG capsule Take 1 capsule (100 mg total) by mouth 2 (two) times daily. If taking pain medications daily. Otherwise take as needed for constipation 03/02/14   Bonnielee Haff, MD  glucosamine-chondroitin 500-400 MG tablet Take 1 tablet by mouth daily with lunch.     Historical Provider, MD  lidocaine-prilocaine (EMLA) cream Apply 1 application topically daily as needed (Applies to port-a-cath.). 05/10/13   Curt Bears, MD  metoprolol tartrate (LOPRESSOR) 25 MG tablet Take 1 tablet (25 mg total) by mouth 2 (two) times daily. 03/02/14   Bonnielee Haff, MD   mirabegron ER (MYRBETRIQ) 25 MG TB24 tablet Take 25 mg by mouth daily.    Historical Provider, MD  Multiple Vitamin (MULTIVITAMIN WITH MINERALS) TABS Take 1 tablet by mouth daily with lunch. She takes Dance movement psychotherapist Adult 50+.    Historical Provider, MD  Omega-3 Fatty Acids (FISH OIL CONCENTRATE PO) Take 1 capsule by mouth every morning. Pt unsure of dose, takes daily    Historical Provider, MD  ondansetron (ZOFRAN) 8 MG tablet Take 1 tablet (8 mg total) by mouth every 8 (eight) hours as needed for nausea or vomiting. 11/16/13   Curt Bears, MD  oxyCODONE-acetaminophen (PERCOCET/ROXICET) 5-325 MG per tablet Take 1-2 tablets by mouth every 6 (six) hours as needed for severe pain. 03/02/14   Bonnielee Haff, MD  polyethylene glycol (MIRALAX / GLYCOLAX) packet Take 17 g by mouth daily as needed.    Historical Provider, MD  prochlorperazine (COMPAZINE) 10 MG tablet Take 1 tablet (10 mg total) by mouth every 6 (six) hours as needed for nausea or vomiting. 11/16/13   Curt Bears, MD  senna (SENOKOT) 8.6 MG TABS tablet Take 1 tablet by mouth daily as needed for mild constipation.    Historical Provider, MD  temozolomide (TEMODAR) 250 MG capsule Take 1 capsule (250 mg total) by mouth daily for 5 days every 4 weeks. Patient not taking: Reported on 03/01/2014 11/14/13   Curt Bears, MD   BP 136/83 mmHg  Pulse 101  Temp(Src) 98.9 F (37.2 C) (Oral)  Resp 18  Ht 5\' 7"  (1.702 m)  Wt 106 lb (48.081 kg)  BMI 16.60 kg/m2  SpO2 96% Physical Exam  Constitutional: She appears well-developed and well-nourished.  Ill appearing  HENT:  Head: Normocephalic.  Oral mucosa is dry  Eyes: Conjunctivae and EOM are normal. Pupils are equal, round, and reactive to light.  Neck: Normal range of motion. Neck supple.  Cardiovascular: Normal rate, regular rhythm and normal heart sounds.   Pulmonary/Chest: Effort normal and breath sounds normal. No respiratory distress. She has no wheezes. She has no rales.   Abdominal: Soft. Bowel sounds are normal. She exhibits no distension. There is no tenderness. There is no rebound.  Musculoskeletal: She exhibits no edema.  DP pulses and radial pulses intact and equal bilaterally.  Neurological: She is alert. No cranial nerve deficit. She exhibits normal muscle tone.  Oriented to self and place. Unable to recall time or year. Moves all extremities bilaterally. Slight left grip weakness compared to the right. Unable to hold her arms in front of her more than 1 second.  Skin: Skin is warm and dry.  Psychiatric: She has a normal mood and affect. Her behavior is normal.  Nursing note and vitals reviewed.   ED Course  Procedures (including critical care time) Labs Review Labs Reviewed  CBC WITH DIFFERENTIAL/PLATELET - Abnormal; Notable for the following:  Neutrophils Relative % 92 (*)    Lymphocytes Relative 6 (*)    Lymphs Abs 0.4 (*)    Monocytes Relative 2 (*)    All other components within normal limits  COMPREHENSIVE METABOLIC PANEL - Abnormal; Notable for the following:    Sodium 129 (*)    Chloride 92 (*)    Glucose, Bld 132 (*)    BUN 24 (*)    ALT 46 (*)    GFR calc non Af Amer 54 (*)    GFR calc Af Amer 63 (*)    All other components within normal limits  AMMONIA  I-STAT CG4 LACTIC ACID, ED  Randolm Idol, ED    Imaging Review Ct Head W Wo Contrast  03/04/2014   CLINICAL DATA:  Weakness, change in behavior. History of metastatic lung cancer with treated brain metastasis.  EXAM: CT HEAD WITHOUT AND WITH CONTRAST  TECHNIQUE: Contiguous axial images were obtained from the base of the skull through the vertex without and with intravenous contrast  CONTRAST:  69mL OMNIPAQUE IOHEXOL 300 MG/ML  SOLN  COMPARISON:  CT of the head March 01, 2014 and MRI of the head October 08, 2013  FINDINGS: Moderate ventriculomegaly, likely on the basis of global parenchymal brain volume loss as there is overall commensurate enlargement of cerebral sulci  and cerebellar folia, unchanged. No intraparenchymal hemorrhage, mass effect, midline shift. Confluent supratentorial white matter hypodensities, similar to prior CT. No acute large vascular territory infarct. Faint enhancing 4 mm lesion in RIGHT frontal gray-white matter junction, axial 13 of 31, and appears new from prior MRI. Patient's known treated metastasis are difficult to discern on CT.  No abnormal extra-axial fluid collections or extra-axial enhancement, no definite extra-axial masses.  No paranasal sinus air-fluid levels. Mastoid air cells are well aerated. No skull fracture. Again noted is the lytic lesion within paracentral occipital calvarium. Ocular globes and orbital contents are nonsuspicious.  IMPRESSION: Enhancing 4 mm RIGHT frontal lobe lesion, concerning for new metastasis though, MRI of the brain with contrast would be more sensitive. Re- demonstration of osseous metastasis.  Moderate global parenchymal brain volume loss. Severe white matter changes, similar to prior examination may reflect sequelae of prior radiation.   Electronically Signed   By: Elon Alas   On: 03/04/2014 00:14     EKG Interpretation   Date/Time:  Sunday March 03 2014 22:38:28 EST Ventricular Rate:  97 PR Interval:  190 QRS Duration: 91 QT Interval:  368 QTC Calculation: 467 R Axis:   -66 Text Interpretation:  Sinus rhythm Left atrial enlargement RSR' in V1 or  V2, right VCD or RVH Inferior infarct, old Consider anterior infarct  Confirmed by YELVERTON  MD, DAVID ([id number]) on 03/04/2014 12:34:18 AM      MDM   Final diagnoses:  Weakness  Dehydration  Metastatic cancer     patient with recent admission for chest pain and some dysphagia which family states happened at home and resolved by the time she came to the hospital. Her CT of the head showed metastatic disease otherwise negative. Patient was worked up for her chest pain and discharged yesterday. He'll he states since the discharged  patient has progressively gotten weaker to the point where she cannot sit up or get out of the bed. She is unable to speak because of the weakness. She is not eating or drinking. Family brought patient back. Will get labs, fluids started, will try some IV steroids. We'll monitor.  12:32 AM CT w/contrast head  obtained which showed possible new metastasis to the frontal lobe, no other findings. Pt's vs remain normal. She continues to have weakness, somnolence. Spoke with [facility], will admit.   Filed Vitals:   03/03/14 2230 03/03/14 2242 03/03/14 2300 03/04/14 0000  BP: 143/82  145/84 135/79  Pulse: 96  101 99  Temp:  99 F (37.2 C)    TempSrc:  Rectal    Resp:   16 15  Height:      Weight:      SpO2: 95%  96% 89%     Renold Genta, PA-C 03/04/14 0034  Tanna Furry, MD 03/05/14 [phone]

## 2014-03-04 ENCOUNTER — Inpatient Hospital Stay (HOSPITAL_COMMUNITY): Payer: Medicare Other

## 2014-03-04 ENCOUNTER — Other Ambulatory Visit: Payer: Self-pay | Admitting: Radiation Oncology

## 2014-03-04 ENCOUNTER — Ambulatory Visit: Admit: 2014-03-04 | Payer: Medicare Other

## 2014-03-04 ENCOUNTER — Encounter (HOSPITAL_COMMUNITY): Payer: Self-pay | Admitting: Internal Medicine

## 2014-03-04 ENCOUNTER — Ambulatory Visit: Admission: RE | Admit: 2014-03-04 | Payer: Medicare Other | Source: Ambulatory Visit | Admitting: Radiation Oncology

## 2014-03-04 ENCOUNTER — Ambulatory Visit: Payer: Medicare Other

## 2014-03-04 DIAGNOSIS — C7931 Secondary malignant neoplasm of brain: Secondary | ICD-10-CM | POA: Diagnosis present

## 2014-03-04 DIAGNOSIS — C7971 Secondary malignant neoplasm of right adrenal gland: Secondary | ICD-10-CM | POA: Diagnosis present

## 2014-03-04 DIAGNOSIS — N179 Acute kidney failure, unspecified: Secondary | ICD-10-CM | POA: Diagnosis present

## 2014-03-04 DIAGNOSIS — Z8042 Family history of malignant neoplasm of prostate: Secondary | ICD-10-CM | POA: Diagnosis not present

## 2014-03-04 DIAGNOSIS — Z9221 Personal history of antineoplastic chemotherapy: Secondary | ICD-10-CM | POA: Diagnosis not present

## 2014-03-04 DIAGNOSIS — E43 Unspecified severe protein-calorie malnutrition: Secondary | ICD-10-CM | POA: Diagnosis present

## 2014-03-04 DIAGNOSIS — Z8701 Personal history of pneumonia (recurrent): Secondary | ICD-10-CM | POA: Diagnosis not present

## 2014-03-04 DIAGNOSIS — J189 Pneumonia, unspecified organism: Secondary | ICD-10-CM | POA: Diagnosis not present

## 2014-03-04 DIAGNOSIS — T380X5A Adverse effect of glucocorticoids and synthetic analogues, initial encounter: Secondary | ICD-10-CM | POA: Diagnosis present

## 2014-03-04 DIAGNOSIS — C7951 Secondary malignant neoplasm of bone: Secondary | ICD-10-CM | POA: Diagnosis present

## 2014-03-04 DIAGNOSIS — I638 Other cerebral infarction: Secondary | ICD-10-CM | POA: Diagnosis present

## 2014-03-04 DIAGNOSIS — D72829 Elevated white blood cell count, unspecified: Secondary | ICD-10-CM | POA: Diagnosis present

## 2014-03-04 DIAGNOSIS — Z515 Encounter for palliative care: Secondary | ICD-10-CM | POA: Diagnosis not present

## 2014-03-04 DIAGNOSIS — R5381 Other malaise: Secondary | ICD-10-CM | POA: Diagnosis present

## 2014-03-04 DIAGNOSIS — G893 Neoplasm related pain (acute) (chronic): Secondary | ICD-10-CM | POA: Diagnosis present

## 2014-03-04 DIAGNOSIS — E861 Hypovolemia: Secondary | ICD-10-CM | POA: Diagnosis present

## 2014-03-04 DIAGNOSIS — Z87891 Personal history of nicotine dependence: Secondary | ICD-10-CM | POA: Diagnosis not present

## 2014-03-04 DIAGNOSIS — I10 Essential (primary) hypertension: Secondary | ICD-10-CM | POA: Diagnosis present

## 2014-03-04 DIAGNOSIS — C787 Secondary malignant neoplasm of liver and intrahepatic bile duct: Secondary | ICD-10-CM | POA: Diagnosis present

## 2014-03-04 DIAGNOSIS — G9340 Encephalopathy, unspecified: Secondary | ICD-10-CM | POA: Diagnosis present

## 2014-03-04 DIAGNOSIS — D630 Anemia in neoplastic disease: Secondary | ICD-10-CM | POA: Diagnosis present

## 2014-03-04 DIAGNOSIS — Z681 Body mass index (BMI) 19 or less, adult: Secondary | ICD-10-CM | POA: Diagnosis not present

## 2014-03-04 DIAGNOSIS — Z66 Do not resuscitate: Secondary | ICD-10-CM | POA: Diagnosis present

## 2014-03-04 DIAGNOSIS — C3490 Malignant neoplasm of unspecified part of unspecified bronchus or lung: Secondary | ICD-10-CM | POA: Diagnosis present

## 2014-03-04 DIAGNOSIS — E222 Syndrome of inappropriate secretion of antidiuretic hormone: Secondary | ICD-10-CM | POA: Diagnosis present

## 2014-03-04 DIAGNOSIS — E860 Dehydration: Secondary | ICD-10-CM

## 2014-03-04 DIAGNOSIS — C7972 Secondary malignant neoplasm of left adrenal gland: Secondary | ICD-10-CM | POA: Diagnosis present

## 2014-03-04 DIAGNOSIS — Z801 Family history of malignant neoplasm of trachea, bronchus and lung: Secondary | ICD-10-CM | POA: Diagnosis not present

## 2014-03-04 DIAGNOSIS — Z923 Personal history of irradiation: Secondary | ICD-10-CM | POA: Diagnosis not present

## 2014-03-04 DIAGNOSIS — R531 Weakness: Secondary | ICD-10-CM

## 2014-03-04 DIAGNOSIS — E871 Hypo-osmolality and hyponatremia: Secondary | ICD-10-CM | POA: Diagnosis present

## 2014-03-04 LAB — SODIUM, URINE, RANDOM: Sodium, Ur: 32 mEq/L

## 2014-03-04 LAB — MAGNESIUM: Magnesium: 1.6 mg/dL (ref 1.5–2.5)

## 2014-03-04 LAB — CBC
HEMATOCRIT: 33.8 % — AB (ref 36.0–46.0)
Hemoglobin: 11.4 g/dL — ABNORMAL LOW (ref 12.0–15.0)
MCH: 30.1 pg (ref 26.0–34.0)
MCHC: 33.7 g/dL (ref 30.0–36.0)
MCV: 89.2 fL (ref 78.0–100.0)
Platelets: 372 10*3/uL (ref 150–400)
RBC: 3.79 MIL/uL — ABNORMAL LOW (ref 3.87–5.11)
RDW: 14.7 % (ref 11.5–15.5)
WBC: 9.2 10*3/uL (ref 4.0–10.5)

## 2014-03-04 LAB — COMPREHENSIVE METABOLIC PANEL
ALK PHOS: 88 U/L (ref 39–117)
ALT: 18 U/L (ref 0–35)
AST: 26 U/L (ref 0–37)
Albumin: 3.6 g/dL (ref 3.5–5.2)
Anion gap: 10 (ref 5–15)
BILIRUBIN TOTAL: 0.5 mg/dL (ref 0.3–1.2)
BUN: 23 mg/dL (ref 6–23)
CALCIUM: 8.7 mg/dL (ref 8.4–10.5)
CO2: 23 mmol/L (ref 19–32)
CREATININE: 1.02 mg/dL (ref 0.50–1.10)
Chloride: 93 mmol/L — ABNORMAL LOW (ref 96–112)
GFR, EST AFRICAN AMERICAN: 61 mL/min — AB (ref >90)
GFR, EST NON AFRICAN AMERICAN: 53 mL/min — AB (ref >90)
GLUCOSE: 134 mg/dL — AB (ref 70–99)
Potassium: 3.6 mmol/L (ref 3.5–5.1)
SODIUM: 126 mmol/L — AB (ref 135–145)
Total Protein: 6.5 g/dL (ref 6.0–8.3)

## 2014-03-04 LAB — URINALYSIS, ROUTINE W REFLEX MICROSCOPIC
Bilirubin Urine: NEGATIVE
GLUCOSE, UA: NEGATIVE mg/dL
HGB URINE DIPSTICK: NEGATIVE
Ketones, ur: 15 mg/dL — AB
Leukocytes, UA: NEGATIVE
Nitrite: NEGATIVE
PH: 5.5 (ref 5.0–8.0)
PROTEIN: NEGATIVE mg/dL
Specific Gravity, Urine: >1.046 — ABNORMAL HIGH (ref 1.005–1.030)
Urobilinogen, UA: 0.2 mg/dL (ref 0.0–1.0)

## 2014-03-04 LAB — I-STAT TROPONIN, ED: TROPONIN I, POC: 0.02 ng/mL (ref 0.00–0.08)

## 2014-03-04 LAB — TSH: TSH: 0.712 u[IU]/mL (ref 0.350–4.500)

## 2014-03-04 LAB — OSMOLALITY, URINE: Osmolality, Ur: 516 mOsm/kg (ref 390–1090)

## 2014-03-04 LAB — CREATININE, URINE, RANDOM: CREATININE, URINE: 100.5 mg/dL

## 2014-03-04 LAB — I-STAT CG4 LACTIC ACID, ED: Lactic Acid, Venous: 0.56 mmol/L (ref 0.5–2.0)

## 2014-03-04 LAB — PHOSPHORUS: Phosphorus: 4.1 mg/dL (ref 2.3–4.6)

## 2014-03-04 MED ORDER — ONDANSETRON HCL 4 MG PO TABS: 4.0000 mg | ORAL_TABLET | Freq: Four times a day (QID) | ORAL | Status: DC | PRN

## 2014-03-04 MED ORDER — DOCUSATE SODIUM 100 MG PO CAPS: 100.0000 mg | ORAL_CAPSULE | Freq: Two times a day (BID) | ORAL | Status: DC

## 2014-03-04 MED ORDER — DEXAMETHASONE SODIUM PHOSPHATE 10 MG/ML IJ SOLN
10.0000 mg | Freq: Four times a day (QID) | INTRAMUSCULAR | Status: DC
  Administered 2014-03-04 – 2014-03-05 (×5): 10 mg via INTRAVENOUS
  Filled 2014-03-04 (×9): qty 1

## 2014-03-04 MED ORDER — MIRABEGRON ER 25 MG PO TB24
25.0000 mg | ORAL_TABLET | Freq: Every day | ORAL | Status: DC
  Administered 2014-03-04 – 2014-03-09 (×5): 25 mg via ORAL
  Filled 2014-03-04 (×7): qty 1

## 2014-03-04 MED ORDER — ONDANSETRON HCL 4 MG/2ML IJ SOLN: 4.0000 mg | Freq: Four times a day (QID) | INTRAMUSCULAR | Status: DC | PRN

## 2014-03-04 MED ORDER — SODIUM CHLORIDE 0.9 % IV SOLN
INTRAVENOUS | Status: AC
  Administered 2014-03-04: 08:00:00 via INTRAVENOUS

## 2014-03-04 MED ORDER — OXYCODONE-ACETAMINOPHEN 5-325 MG PO TABS
1.0000 | ORAL_TABLET | Freq: Four times a day (QID) | ORAL | Status: DC | PRN
  Administered 2014-03-04 (×2): 1 via ORAL
  Filled 2014-03-04 (×2): qty 2

## 2014-03-04 MED ORDER — SENNA 8.6 MG PO TABS: 1.0000 | ORAL_TABLET | Freq: Every day | ORAL | Status: DC | PRN

## 2014-03-04 MED ORDER — ACETAMINOPHEN 325 MG PO TABS
650.0000 mg | ORAL_TABLET | Freq: Four times a day (QID) | ORAL | Status: DC | PRN
  Administered 2014-03-04 – 2014-03-06 (×2): 650 mg via ORAL
  Filled 2014-03-04 (×2): qty 2

## 2014-03-04 MED ORDER — DOCUSATE SODIUM 100 MG PO CAPS
100.0000 mg | ORAL_CAPSULE | Freq: Two times a day (BID) | ORAL | Status: DC
  Administered 2014-03-04 – 2014-03-05 (×4): 100 mg via ORAL
  Filled 2014-03-04 (×8): qty 1

## 2014-03-04 MED ORDER — GADOBENATE DIMEGLUMINE 529 MG/ML IV SOLN
10.0000 mL | Freq: Once | INTRAVENOUS | Status: AC | PRN
  Administered 2014-03-04: 9 mL via INTRAVENOUS

## 2014-03-04 MED ORDER — ACETAMINOPHEN 650 MG RE SUPP: 650.0000 mg | Freq: Four times a day (QID) | RECTAL | Status: DC | PRN

## 2014-03-04 NOTE — ED Notes (Signed)
Pt noted desating to 88-89% RA while sleeping with snoring resp. Resp even and unlabored. No audible adventitious breath sounds. Started O2 at 2lpm via [place] with increase in sats at 98%.

## 2014-03-04 NOTE — H&P (Signed)
PCP:  Lujean Amel, MD  Dr. Isidore Moos is her radiation oncologist. Oncology Mohamed Chief Complaint:  debility  HPI: Cheryl Nielsen is a 74 y.o. female   has a past medical history of Pneumonia, organism unspecified; Status post chemotherapy ( 10/11/2012 ); S/P radiation therapy (08/24/2012-09/11/2012); Lung cancer (07/05/12); and S/P radiation therapy ( 07/24/2012-08/10/2012  ).   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 She's currently been followed by Dr. Julien Nordmann. Patient is on chemotherapy as well as radiation therapy to the brain metastases Patient was admitted recently with chest pain and transient dysphagia. She has undergone workup including noncontrasted CT scan of the head and cycling cardiac enzymes. She was discharged to home but by the end of her hospital stay started to feel noticeably weaker. At home overall her condition continued to worsen. She stopped taking by mouth and at this point so weak she was unable to set up. She was also recently started on chemotherapy which has preceded the symptoms. Family called her oncologist who started patient on steroids thinking that she may have some postradiation brain edema. IN  emergency department today she had given a head done with contrast showing 4 mm enhancing right frontal lobe lesion possibly new metastasis. Spoke at length with daughter who is at bedside. Of note patient has 5 children altogether. Patient's daughter reiterated that patient is DO NOT RESUSCITATE DO NOT INTUBATE of weighted aggressive interventions. Family states that patient does not wish to continue radiation therapy was still would like to get an MRI to evaluate this further. Family is agreeable to administration of IV fluids and antibiotics avoid any aggressive care otherwise  Hospitalist was called for admission for hydration worsening performance status possible new brain  metastases  Review of Systems:    Pertinent positives include:  fatigue, poor by mouth intake  Constitutional:  No weight loss, night sweats, Fevers, chills, weight loss  HEENT:  No headaches, Difficulty swallowing,Tooth/dental problems,Sore throat,  No sneezing, itching, ear ache, nasal congestion, post nasal drip,  Cardio-vascular:  No chest pain, Orthopnea, PND, anasarca, dizziness, palpitations.no Bilateral lower extremity swelling  GI:  No heartburn, indigestion, abdominal pain, nausea, vomiting, diarrhea, change in bowel habits, loss of appetite, melena, blood in stool, hematemesis Resp:  no shortness of breath at rest. No dyspnea on exertion, No excess mucus, no productive cough, No non-productive cough, No coughing up of blood.No change in color of mucus.No wheezing. Skin:  no rash or lesions. No jaundice GU:  no dysuria, change in color of urine, no urgency or frequency. No straining to urinate.  No flank pain.  Musculoskeletal:  No joint pain or no joint swelling. No decreased range of motion. No back pain.  Psych:  No change in mood or affect. No depression or anxiety. No memory loss.  Neuro: no localizing neurological complaints, no tingling, no weakness, no double vision, no gait abnormality, no slurred speech, no confusion  Otherwise ROS are negative except for above, 10 systems were reviewed  Past Medical History: Past Medical History  Diagnosis Date  . Pneumonia, organism unspecified   . Status post chemotherapy  10/11/2012     carboplatin  . S/P radiation therapy 08/24/2012-09/11/2012    Whole Brain  / 32.5 Gy in 13 fractions  . 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 S/P radiation therapy  07/24/2012-08/10/2012      Mediastinum and Left Hilum /  35 Gy in 14 fractions     Past Surgical History  Procedure Laterality Date  . Nasal sinus surgery    . Video bronchoscopy Bilateral 07/05/2012    Procedure: VIDEO BRONCHOSCOPY WITHOUT  FLUORO;  Surgeon: Tanda Rockers, MD;  Location: [facility];  Service: Cardiopulmonary;  Laterality: Bilateral;  . Portacath placement       Medications: Prior to Admission medications   Medication Sig Start Date End Date Taking? Authorizing Provider  acetaminophen (TYLENOL) 500 MG tablet Take 1,000 mg by mouth every 6 (six) hours as needed (For headache.).    Historical Provider, MD  dexamethasone (DECADRON) 4 MG tablet Take 1 tablet (4 mg total) by mouth 3 (three) times daily. 03/03/14   Curt Bears, MD  diphenoxylate-atropine (LOMOTIL) 2.5-0.025 MG per tablet Take 1 tablet by mouth 2 (two) times daily as needed for diarrhea or loose stools. 05/23/13   Curt Bears, MD  docusate sodium (COLACE) 100 MG capsule Take 1 capsule (100 mg total) by mouth 2 (two) times daily. If taking pain medications daily. Otherwise take as needed for constipation 03/02/14   Bonnielee Haff, MD  glucosamine-chondroitin 500-400 MG tablet Take 1 tablet by mouth daily with lunch.     Historical Provider, MD  lidocaine-prilocaine (EMLA) cream Apply 1 application topically daily as needed (Applies to port-a-cath.). 05/10/13   Curt Bears, MD  metoprolol tartrate (LOPRESSOR) 25 MG tablet Take 1 tablet (25 mg total) by mouth 2 (two) times daily. 03/02/14   Bonnielee Haff, MD  mirabegron ER (MYRBETRIQ) 25 MG TB24 tablet Take 25 mg by mouth daily.    Historical Provider, MD  Multiple Vitamin (MULTIVITAMIN WITH MINERALS) TABS Take 1 tablet by mouth daily with lunch. She takes Dance movement psychotherapist Adult 50+.    Historical Provider, MD  Omega-3 Fatty Acids (FISH OIL CONCENTRATE PO) Take 1 capsule by mouth every morning. Pt unsure of dose, takes daily    Historical Provider, MD  ondansetron (ZOFRAN) 8 MG tablet Take 1 tablet (8 mg total) by mouth every 8 (eight) hours as needed for nausea or vomiting. 11/16/13   Curt Bears, MD  oxyCODONE-acetaminophen (PERCOCET/ROXICET) 5-325 MG per tablet Take 1-2 tablets by mouth every 6  (six) hours as needed for severe pain. 03/02/14   Bonnielee Haff, MD  polyethylene glycol (MIRALAX / GLYCOLAX) packet Take 17 g by mouth daily as needed.    Historical Provider, MD  prochlorperazine (COMPAZINE) 10 MG tablet Take 1 tablet (10 mg total) by mouth every 6 (six) hours as needed for nausea or vomiting. 11/16/13   Curt Bears, MD  senna (SENOKOT) 8.6 MG TABS tablet Take 1 tablet by mouth daily as needed for mild constipation.    Historical Provider, MD  temozolomide (TEMODAR) 250 MG capsule Take 1 capsule (250 mg total) by mouth daily for 5 days every 4 weeks. Patient not taking: Reported on 03/01/2014 11/14/13   Curt Bears, MD    Allergies:   Allergies  Allergen Reactions  . Asa [Aspirin]     GI upset  . Codeine     syncope  . Tramadol Nausea And Vomiting    Social History:  Ambulatory independently prior to few days ago Lives at home  With family     reports that she quit smoking about 22 months ago. Her smoking use included Cigarettes. She has a 40 pack-year smoking history. She has never used smokeless tobacco. She reports that she does not drink alcohol or use illicit drugs.    Family History:  family history includes Colon cancer in her brother; Heart disease in her father and mother; Lung cancer (age of onset: 43) in her mother; Prostate cancer in her father.    Physical Exam: Patient Vitals for the past 24 hrs:  BP Temp Temp src Pulse Resp SpO2 Height Weight  03/04/14 0000 135/79 mmHg - - 99 15 (!) 89 % - -  03/03/14 2300 145/84 mmHg - - 101 16 96 % - -  03/03/14 2242 - 99 F (37.2 C) Rectal - - - - -  03/03/14 2230 143/82 mmHg - - 96 - 95 % - -  03/03/14 2203 - - - - - 96 % - -  03/03/14 2202 136/83 mmHg 98.9 F (37.2 C) Oral 101 18 96 % 5\' 7"  (1.702 m) 48.081 kg (106 lb)  03/03/14 2200 136/83 mmHg - - 99 - 94 % - -    1. General:  in No Acute distress 2. Psychological: somnolent but arousale to verbal stimuli not Oriented, not following  comands 3. Head/ENT:     Dry Mucous Membranes                          Head Non traumatic, neck supple                          Normal   Dentition 4. SKIN:  decreased Skin turgor,  Skin clean Dry and intact no rash 5. Heart: Regular rate and rhythm no Murmur, Rub or gallop 6. Lungs:  no wheezes or crackles   7. Abdomen: Soft, non-tender, Non distended 8. Lower extremities: no clubbing, cyanosis, or edema 9. Neurologically Grossly intact, moving all 4 extremities equally 10. MSK: Normal range of motion  body mass index is 16.6 kg/(m^2).   Labs on Admission:   Results for orders placed or performed during the hospital encounter of 03/03/14 (from the past 24 hour(s))  CBC with Differential     Status: Abnormal   Collection Time: 03/03/14 10:11 PM  Result Value Ref Range   WBC 6.6 4.0 - 10.5 K/uL   RBC 4.19 3.87 - 5.11 MIL/uL   Hemoglobin 12.8 12.0 - 15.0 g/dL   HCT 37.2 36.0 - 46.0 %   MCV 88.8 78.0 - 100.0 fL   MCH 30.5 26.0 - 34.0 pg   MCHC 34.4 30.0 - 36.0 g/dL   RDW 14.8 11.5 - 15.5 %   Platelets 391 150 - 400 K/uL   Neutrophils Relative % 92 (H) 43 - 77 %   Neutro Abs 6.1 1.7 - 7.7 K/uL   Lymphocytes Relative 6 (L) 12 - 46 %   Lymphs Abs 0.4 (L) 0.7 - 4.0 K/uL   Monocytes Relative 2 (L) 3 - 12 %   Monocytes Absolute 0.2 0.1 - 1.0 K/uL   Eosinophils Relative 0 0 - 5 %   Eosinophils Absolute 0.0 0.0 - 0.7 K/uL   Basophils Relative 0 0 - 1 %   Basophils Absolute 0.0 0.0 - 0.1 K/uL  Comprehensive metabolic panel     Status: Abnormal   Collection Time: 03/03/14 10:11 PM  Result Value Ref Range   Sodium 129 (L) 135 - 145 mmol/L   Potassium 3.6 3.5 - 5.1 mmol/L   Chloride 92 (L) 96 - 112 mmol/L   CO2 25 19 - 32 mmol/L   Glucose, Bld 132 (H) 70 - 99 mg/dL   BUN 24 (H) 6 -  23 mg/dL   Creatinine, Ser 1.00 0.50 - 1.10 mg/dL   Calcium 9.8 8.4 - 10.5 mg/dL   Total Protein 7.3 6.0 - 8.3 g/dL   Albumin 4.1 3.5 - 5.2 g/dL   AST 30 0 - 37 U/L   ALT 46 (H) 0 - 35 U/L   Alkaline  Phosphatase 109 39 - 117 U/L   Total Bilirubin 0.7 0.3 - 1.2 mg/dL   GFR calc non Af Amer 54 (L) >90 mL/min   GFR calc Af Amer 63 (L) >90 mL/min   Anion gap 12 5 - 15  Ammonia     Status: None   Collection Time: 03/03/14 10:12 PM  Result Value Ref Range   Ammonia 13 11 - 32 umol/L  I-Stat Troponin, ED - 0, 3, 6 hours (not at [facility])     Status: None   Collection Time: 03/03/14 10:34 PM  Result Value Ref Range   Troponin i, poc 0.01 0.00 - 0.08 ng/mL   Comment 3          I-Stat CG4 Lactic Acid, ED     Status: None   Collection Time: 03/03/14 10:37 PM  Result Value Ref Range   Lactic Acid, Venous 0.94 0.5 - 2.0 mmol/L    UA not obtained in order  No results found for: HGBA1C  Estimated Creatinine Clearance: 37.5 mL/min (by C-G formula based on Cr of 1).  BNP (last 3 results) No results for input(s): PROBNP in the last 8760 hours.  Other results:  I have pearsonaly reviewed this: ECG REPORT  Rate:97   Rhythm: Sinus rhythm ST&T Change: No ischemia   Filed Weights   03/03/14 2202  Weight: 48.081 kg (106 lb)     Cultures:    Component Value Date/Time   SDES TRACHEAL ASPIRATE 07/05/2012 0827   SPECREQUEST Normal 07/05/2012 0827   CULT NO ACID FAST BACILLI ISOLATED IN 6 WEEKS 07/05/2012 0827   REPTSTATUS 08/17/2012 FINAL 07/05/2012 0827     Radiological Exams on Admission: Ct Head W Wo Contrast  03/04/2014   CLINICAL DATA:  Weakness, change in behavior. History of metastatic lung cancer with treated brain metastasis.  EXAM: CT HEAD WITHOUT AND WITH CONTRAST  TECHNIQUE: Contiguous axial images were obtained from the base of the skull through the vertex without and with intravenous contrast  CONTRAST:  96mL OMNIPAQUE IOHEXOL 300 MG/ML  SOLN  COMPARISON:  CT of the head March 01, 2014 and MRI of the head October 08, 2013  FINDINGS: Moderate ventriculomegaly, likely on the basis of global parenchymal brain volume loss as there is overall commensurate enlargement of  cerebral sulci and cerebellar folia, unchanged. No intraparenchymal hemorrhage, mass effect, midline shift. Confluent supratentorial white matter hypodensities, similar to prior CT. No acute large vascular territory infarct. Faint enhancing 4 mm lesion in RIGHT frontal gray-white matter junction, axial 13 of 31, and appears new from prior MRI. Patient's known treated metastasis are difficult to discern on CT.  No abnormal extra-axial fluid collections or extra-axial enhancement, no definite extra-axial masses.  No paranasal sinus air-fluid levels. Mastoid air cells are well aerated. No skull fracture. Again noted is the lytic lesion within paracentral occipital calvarium. Ocular globes and orbital contents are nonsuspicious.  IMPRESSION: Enhancing 4 mm RIGHT frontal lobe lesion, concerning for new metastasis though, MRI of the brain with contrast would be more sensitive. Re- demonstration of osseous metastasis.  Moderate global parenchymal brain volume loss. Severe white matter changes, similar to prior examination may reflect  sequelae of prior radiation.   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 Encephalopathy - possibly secondary to radiation induced brain edema versus dehydration or combination of Demerol. Will give IV fluids continue IV steroids patient unable to tolerate by mouth. Would benefit from discussion with oncology in the morning regarding overall goals of care. Will obtain UA to evaluate for any evidence of UTI as a contributing factor  . Small cell lung cancer  - as per oncology  . Dehydration IV fluids  . Brain metastases - CT scan worrisome for new lesion. Will obtain MRI in the morning as per family request  . Debility - likely multyfactorial, patient overall with a poor prognosis apparently has to compensated the past few days to see if improves with conservative measures    Prophylaxis: SCD  for now until MRI is done, Protonix  CODE STATUS:  DNR/DNI confirmed by family  Other plan as per orders.  I have spent a total of 55 min on this admission  Cheryl Nielsen 03/04/2014, 12:29 AM  [facility]  Pager [phone]   after 2 AM please page floor coverage PA If 7AM-7PM, please contact the day team taking care of the patient  [email or web address]  Password [email or web address]

## 2014-03-04 NOTE — Consult Note (Signed)
Patient [id number] R [place]      DOB: Dec 13, 1940      ION:[id number]     Consult Note from the Palliative Medicine Team at [facility] Requested by: Dr Sallyanne Havers     PCP: Lujean Amel, MD Reason for Consultation: Clarification of [place] and options     Phone [phone]  Assessment of patients Current state:   Continued physical, functional and codnitive decline 2/2 metastatic lung cancer diagnosis.  Patient and her family are faced with treatment option decisions and anticipatory care needs.  Consult is for review of medical treatment options, clarification of goals of care, disposition and options, and symptom recommendation as indicated  This NP Wadie Lessen reviewed medical records, received report from team, assessed the patient and then meet at the patient's bedside along with family  To include daughters [name] and Marliss Czar and to discuss diagnosis, prognosis, GOC,  disposition and options.  We discussed the difference between a aggressive medical intervention path  and a palliative comfort care path for this patient at this time was had.  Values and goals of care important to patient and family were attempted to be elicited.  Supportive   Concept of Hospice and Palliative Care were discussed  Questions and concerns addressed.  Family encouraged to call with questions or concerns.  PMT will continue to support holistically.   Goals of Care: 1.  Code Status: DNR/DNI   2. Scope of Treatment: At this time radiation is on hold. She has an appointment at the end of the month with Dr Earlie Server.   On dc she is hopeful for short term rehabilitation, "to build up her strength" to return home and continue with offered treatments if it makes sense within the context of her [place]  3. Disposition:  Hopeful for SNF for rehabilitation    4. Symptom Management:   1. Weakness:  PT as tolerated and safety as a priority, consider SNF for rehabilitation  2. Bowel Regimen:  (patient  has not has a BM in 5 days)        -dulcolax supp NOW        -Senna-s  One tablet po bid        -continue miralax  5. Psychosocial:  Emotional support offered to patient and family at bedside.  All understand the seriousness of her diagnosis and limited prognosis.  Priority is comfort and dignity and patient remains hopeful for more quality of time.  Patient and family are grateful for the opportunity and assistance with facilitating the difficult conversation and decision making related to her current medical situation.  6. Spiritual:  Strong community church support  Patient Documents Completed or Given: Document Given Completed  Advanced Directives Pkt    MOST X   DNR X   Gone from My Sight    Hard Choices      Brief HPI:   74 year old Caucasian female with metastatic lung cancer with brain metastases, undergoing chemotherapy and radiation treatment. She was admitted last week with chest pain and transient confusion. Pain was thought to be secondary to cancer. Confusion, resolved and she was discharged home on 03/02/2014. She presented 03/03/2014 with the worsening confusion. She was admitted for further management.    ROS: weakness, unsteady gait   PMH:  Past Medical History  Diagnosis Date  . Pneumonia, organism unspecified   . Status post chemotherapy  10/11/2012     carboplatin  . S/P radiation therapy 08/24/2012-09/11/2012    Whole Brain  /  32.5 Gy in 13 fractions  . 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 S/P radiation therapy  07/24/2012-08/10/2012      Mediastinum and Left Hilum / 35 Gy in 14 fractions       PSH: Past Surgical History  Procedure Laterality Date  . Nasal sinus surgery    . Video bronchoscopy Bilateral 07/05/2012    Procedure: VIDEO BRONCHOSCOPY WITHOUT FLUORO;  Surgeon: Tanda Rockers, MD;  Location: [facility];  Service: Cardiopulmonary;  Laterality: Bilateral;  . Portacath placement     I have reviewed the FH and SH  and  If appropriate update it with new information. Allergies  Allergen Reactions  . Asa [Aspirin]     GI upset  . Codeine     syncope  . Tramadol Nausea And Vomiting   Scheduled Meds: . dexamethasone  10 mg Intravenous 4 times per day  . docusate sodium  100 mg Oral BID  . mirabegron ER  25 mg Oral Daily   Continuous Infusions:  PRN Meds:.acetaminophen **OR** acetaminophen, ondansetron **OR** ondansetron (ZOFRAN) IV, oxyCODONE-acetaminophen, senna    BP 145/80 mmHg  Pulse 104  Temp(Src) 99 F (37.2 C) (Rectal)  Resp 14  Ht 5\' 7"  (1.702 m)  Wt 47.945 kg (105 lb 11.2 oz)  BMI 16.55 kg/m2  SpO2 100%   PPS: 40 % at best   Intake/Output Summary (Last 24 hours) at 03/04/14 1938 Last data filed at 03/04/14 1800  Gross per 24 hour  Intake   1150 ml  Output    300 ml  Net    850 ml   LBM: 5 days ago per patient                       Physical Exam:  General: chronically ill appearing, NAD HEENT:  Moist buccal membranes no exudate Chest:   CTA CVS: RRR Abdomen: soft NT +BS  Labs: CBC    Component Value Date/Time   WBC 9.2 03/04/2014 0446   WBC 11.7* 02/20/2014 1120   RBC 3.79* 03/04/2014 0446   RBC 3.71 02/20/2014 1120   HGB 11.4* 03/04/2014 0446   HGB 11.1* 02/20/2014 1120   HCT 33.8* 03/04/2014 0446   HCT 34.5* 02/20/2014 1120   PLT 372 03/04/2014 0446   PLT 152 02/20/2014 1120   MCV 89.2 03/04/2014 0446   MCV 92.8 02/20/2014 1120   MCH 30.1 03/04/2014 0446   MCH 30.0 02/20/2014 1120   MCHC 33.7 03/04/2014 0446   MCHC 32.3 02/20/2014 1120   RDW 14.7 03/04/2014 0446   RDW 13.9 02/20/2014 1120   LYMPHSABS 0.4* 03/03/2014 2211   LYMPHSABS 0.7* 02/20/2014 1120   MONOABS 0.2 03/03/2014 2211   MONOABS 1.2* 02/20/2014 1120   EOSABS 0.0 03/03/2014 2211   EOSABS 0.0 02/20/2014 1120   BASOSABS 0.0 03/03/2014 2211   BASOSABS 0.0 02/20/2014 1120    BMET    Component Value Date/Time   NA 126* 03/04/2014 0446   NA 137 02/20/2014 1120   K 3.6 03/04/2014  0446   K 4.1 02/20/2014 1120   CL 93* 03/04/2014 0446   CL 93* 07/10/2012 1544   CO2 23 03/04/2014 0446   CO2 27 02/20/2014 1120   GLUCOSE 134* 03/04/2014 0446   GLUCOSE 79 02/20/2014 1120   GLUCOSE 134* 07/10/2012 1544   BUN 23 03/04/2014 0446   BUN 17.1 02/20/2014 1120   CREATININE 1.02 03/04/2014 0446   CREATININE 1.2*  02/20/2014 1120   CALCIUM 8.7 03/04/2014 0446   CALCIUM 9.2 02/20/2014 1120   GFRNONAA 53* 03/04/2014 0446   GFRAA 61* 03/04/2014 0446    CMP     Component Value Date/Time   NA 126* 03/04/2014 0446   NA 137 02/20/2014 1120   K 3.6 03/04/2014 0446   K 4.1 02/20/2014 1120   CL 93* 03/04/2014 0446   CL 93* 07/10/2012 1544   CO2 23 03/04/2014 0446   CO2 27 02/20/2014 1120   GLUCOSE 134* 03/04/2014 0446   GLUCOSE 79 02/20/2014 1120   GLUCOSE 134* 07/10/2012 1544   BUN 23 03/04/2014 0446   BUN 17.1 02/20/2014 1120   CREATININE 1.02 03/04/2014 0446   CREATININE 1.2* 02/20/2014 1120   CALCIUM 8.7 03/04/2014 0446   CALCIUM 9.2 02/20/2014 1120   PROT 6.5 03/04/2014 0446   PROT 6.9 02/20/2014 1120   ALBUMIN 3.6 03/04/2014 0446   ALBUMIN 3.7 02/20/2014 1120   AST 26 03/04/2014 0446   AST 21 02/20/2014 1120   ALT 18 03/04/2014 0446   ALT 23 02/20/2014 1120   ALKPHOS 88 03/04/2014 0446   ALKPHOS 152* 02/20/2014 1120   BILITOT 0.5 03/04/2014 0446   BILITOT <0.20 02/20/2014 1120   GFRNONAA 53* 03/04/2014 0446   GFRAA 61* 03/04/2014 0446     Time In Time Out Total Time Spent with Patient Total Overall Time  0830 1000 80 min 90 min    Greater than 50%  of this time was spent counseling and coordinating care related to the above assessment and plan.   Wadie Lessen NP  Palliative Medicine Team Team Phone # [phone] Pager [phone]  Discussed with Dr Maryland Pink

## 2014-03-04 NOTE — Progress Notes (Signed)
Weekly Management Note:  Inpatient    ICD-9-CM ICD-10-CM   1. Dehydration 276.51 E86.0   2. Weakness 780.79 R53.1 CT Head W Wo Contrast     CT Head W Wo Contrast  3. Metastatic cancer 199.1 C80.1   4. Brain metastases 198.3 C79.31 MR Brain W Contrast     MR Brain W Contrast    Current Dose:  7.2 Gy  Projected Dose: 23.4 Gy   Narrative:  The patient presents for routine under treatment assessment.  CBCT/MVCT images/Port film x-rays were reviewed.  The chart was checked. I met with the patient twice today, and also visited with multiple family members.  Her mental status is improving but not back to baseline since admission. She started ritalin for fatigue a day after starting RT on 2/8, and around 2/12 she was out of sorts, with mental status waxing and waning during the weekend.  MRI of brain today, reviewed by me, shows little change in the metastases.  Physical Findings:  Wt Readings from Last 3 Encounters:  03/04/14 105 lb 11.2 oz (47.945 kg)  02/25/14 109 lb 4.8 oz (49.578 kg)  02/13/14 108 lb (48.988 kg)    height is 5' 7" (1.702 m) and weight is 105 lb 11.2 oz (47.945 kg). Her rectal temperature is 99 F (37.2 C). Her blood pressure is 145/80 and her pulse is 104. Her respiration is 14 and oxygen saturation is 100%.  no oral thrush. Lungs are grossly CTAB.  Alopecia; skin over scalp without desquamation. She has some slowing of speech, and confusion is less notable in the PM than the AM.   CBC    Component Value Date/Time   WBC 9.2 03/04/2014 0446   WBC 11.7* 02/20/2014 1120   RBC 3.79* 03/04/2014 0446   RBC 3.71 02/20/2014 1120   HGB 11.4* 03/04/2014 0446   HGB 11.1* 02/20/2014 1120   HCT 33.8* 03/04/2014 0446   HCT 34.5* 02/20/2014 1120   PLT 372 03/04/2014 0446   PLT 152 02/20/2014 1120   MCV 89.2 03/04/2014 0446   MCV 92.8 02/20/2014 1120   MCH 30.1 03/04/2014 0446   MCH 30.0 02/20/2014 1120   MCHC 33.7 03/04/2014 0446   MCHC 32.3 02/20/2014 1120   RDW 14.7  03/04/2014 0446   RDW 13.9 02/20/2014 1120   LYMPHSABS 0.4* 03/03/2014 2211   LYMPHSABS 0.7* 02/20/2014 1120   MONOABS 0.2 03/03/2014 2211   MONOABS 1.2* 02/20/2014 1120   EOSABS 0.0 03/03/2014 2211   EOSABS 0.0 02/20/2014 1120   BASOSABS 0.0 03/03/2014 2211   BASOSABS 0.0 02/20/2014 1120     CMP     Component Value Date/Time   NA 126* 03/04/2014 0446   NA 137 02/20/2014 1120   K 3.6 03/04/2014 0446   K 4.1 02/20/2014 1120   CL 93* 03/04/2014 0446   CL 93* 07/10/2012 1544   CO2 23 03/04/2014 0446   CO2 27 02/20/2014 1120   GLUCOSE 134* 03/04/2014 0446   GLUCOSE 79 02/20/2014 1120   GLUCOSE 134* 07/10/2012 1544   BUN 23 03/04/2014 0446   BUN 17.1 02/20/2014 1120   CREATININE 1.02 03/04/2014 0446   CREATININE 1.2* 02/20/2014 1120   CALCIUM 8.7 03/04/2014 0446   CALCIUM 9.2 02/20/2014 1120   PROT 6.5 03/04/2014 0446   PROT 6.9 02/20/2014 1120   ALBUMIN 3.6 03/04/2014 0446   ALBUMIN 3.7 02/20/2014 1120   AST 26 03/04/2014 0446   AST 21 02/20/2014 1120   ALT 18 03/04/2014 0446  ALT 23 02/20/2014 1120   ALKPHOS 88 03/04/2014 0446   ALKPHOS 152* 02/20/2014 1120   BILITOT 0.5 03/04/2014 0446   BILITOT <0.20 02/20/2014 1120   GFRNONAA 53* 03/04/2014 0446   GFRAA 61* 03/04/2014 0446   Dg Chest 2 View  03/01/2014   CLINICAL DATA:  Chest pain that started yesterday. Shortness of breath. Patient currently undergoing radiation therapy. History lung cancer.  EXAM: CHEST  2 VIEW  COMPARISON:  Chest CT - 02/22/2014  FINDINGS: Grossly unchanged cardiac silhouette and mediastinal contours with left-sided volume loss and mild deviation of the cardiomediastinal structures to the left. Stable positioning of support apparatus. Grossly unchanged nodular heterogeneous airspace opacities within the left mid lung. The lungs remain hyperexpanded. No new focal airspace opacities. No pleural effusion or pneumothorax. No evidence of edema. No acute osseus abnormalities.  IMPRESSION: Grossly  unchanged left mid lung heterogeneous opacities and volume loss compatible with provided history of lung cancer and undergoing radiation. No definite superimposed acute cardiopulmonary disease.   Electronically Signed   By: John  Watts M.D.   On: 03/01/2014 15:16   Ct Head Wo Contrast  03/01/2014   CLINICAL DATA:  73-year-old with confusion. History of lung cancer with chest pain. History of intracranial metastatic disease.  EXAM: CT HEAD WITHOUT CONTRAST  TECHNIQUE: Contiguous axial images were obtained from the base of the skull through the vertex without contrast.  COMPARISON:  Brain MRI 02/12/2014  FINDINGS: There is diffuse low density throughout the periventricular and subcortical white matter which is similar to the prior examination. Slightly asymmetric cerebral atrophy. No evidence for acute hemorrhage, large mass lesion, midline shift, hydrocephalus or large new infarct. Visualized sinuses are clear.  Patient has known calvarial metastatic lesions. Again noted is a 1.5 cm lucent lesion in the occipital bone. There is also a subtle bone lesion near the left middle cranial fossa.  IMPRESSION: No acute intracranial abnormality.  Patient has known multiple small brain metastasis which are not well appreciated on this examination. Evidence for osseous metastatic lesions.  Diffuse white matter disease is similar to the previous examination.   Electronically Signed   By: Adam  Henn M.D.   On: 03/01/2014 16:23   Ct Head W Wo Contrast  03/04/2014   CLINICAL DATA:  Weakness, change in behavior. History of metastatic lung cancer with treated brain metastasis.  EXAM: CT HEAD WITHOUT AND WITH CONTRAST  TECHNIQUE: Contiguous axial images were obtained from the base of the skull through the vertex without and with intravenous contrast  CONTRAST:  80mL OMNIPAQUE IOHEXOL 300 MG/ML  SOLN  COMPARISON:  CT of the head March 01, 2014 and MRI of the head October 08, 2013  FINDINGS: Moderate ventriculomegaly, likely  on the basis of global parenchymal brain volume loss as there is overall commensurate enlargement of cerebral sulci and cerebellar folia, unchanged. No intraparenchymal hemorrhage, mass effect, midline shift. Confluent supratentorial white matter hypodensities, similar to prior CT. No acute large vascular territory infarct. Faint enhancing 4 mm lesion in RIGHT frontal gray-white matter junction, axial 13 of 31, and appears new from prior MRI. Patient's known treated metastasis are difficult to discern on CT.  No abnormal extra-axial fluid collections or extra-axial enhancement, no definite extra-axial masses.  No paranasal sinus air-fluid levels. Mastoid air cells are well aerated. No skull fracture. Again noted is the lytic lesion within paracentral occipital calvarium. Ocular globes and orbital contents are nonsuspicious.  IMPRESSION: Enhancing 4 mm RIGHT frontal lobe lesion, concerning for new metastasis though,   MRI of the brain with contrast would be more sensitive. Re- demonstration of osseous metastasis.  Moderate global parenchymal brain volume loss. Severe white matter changes, similar to prior examination may reflect sequelae of prior radiation.   Electronically Signed   By: Courtnay  Bloomer   On: 03/04/2014 00:14   Mr Brain W Contrast  03/04/2014   CLINICAL DATA:  73-year-old female with metastatic lung cancer status post radiation therapy. Restaging. Subsequent encounter.  EXAM: MRI HEAD WITH CONTRAST  TECHNIQUE: Multiplanar, multiecho pulse sequences of the brain and surrounding structures were obtained with intravenous contrast.  COMPARISON:  Head CT 01/01/2015.  Brain MRI 02/12/2014.  CONTRAST:  9mL MULTIHANCE GADOBENATE DIMEGLUMINE 529 MG/ML IV SOLN  FINDINGS: Today's study a 1.5 Tesla using affect her post-contrast images than the 3 Tesla study on 07/08/2014.  Widely scattered small brain metastases re- identified. No definite new or progressed brain metastasis identified. Several of the smallest  metastases identified previously may have mildly regressed column a or may be less visible due to technical factors on today's study.  No associated intracranial mass affect. Confluent cerebral white matter T2 and FLAIR hyperintensity suggesting sequelae of prior whole brain radiation is stable. No new or increased cerebral edema.  Osseous metastatic disease to the skull not significantly changed. No dural thickening or hyper enhancement identified.  There is a punctate focus of restricted diffusion without enhancement at the anterior inferior left basal ganglia seen on series 5 image 21 and series 6, image 26. There is mild associated T2 and FLAIR hyperintensity which is new. Series 8, image 11. No other diffusion abnormality.  Major intracranial vascular flow voids are stable. No ventriculomegaly. Stable small micro hemorrhage at the right vertex. No acute intracranial hemorrhage identified. Negative pituitary. Negative visualized spinal cord. Orbits, paranasal sinuses, and visualized internal auditory structures appear stable. Visualized scalp soft tissues are within normal limits.  IMPRESSION: 1. Punctate nonenhancing focus of restricted diffusion in the inferior left basal ganglia, presumed acute lacunar infarct. No mass effect or hemorrhage. 2. No new or progressive brain metastasis identified. Stable or slight regression of metastatic Brain disease since 02/12/2014 allowing for technical differences between this exam and the prior. 3. No significant change in osseous metastatic disease to the skull.   Electronically Signed   By: H  Hall M.D.   On: 03/04/2014 14:50   Ct Angio Chest Aorta W/cm &/or Wo/cm  03/01/2014   CLINICAL DATA:  Shortness of breath with chest pain and diaphoresis common known history of carcinoma lung  EXAM: CT ANGIOGRAPHY CHEST WITH CONTRAST  TECHNIQUE: Multidetector CT imaging of the chest was performed using the standard protocol during bolus administration of intravenous contrast.  Multiplanar CT image reconstructions and MIPs were obtained to evaluate the vascular anatomy.  CONTRAST:  100mL OMNIPAQUE IOHEXOL 350 MG/ML SOLN  COMPARISON:  02/22/2014  FINDINGS: The lungs are well aerated bilaterally. Scarring is again noted in the right upper lobe. No other significant changes noted within the right lung. There are changes in the left upper lobe consistent with the given clinical history of lung carcinoma. The overall appearance is stable from the recent exam. A few small nodules are noted in the left lower lobe also stable from the prior exam. No sizable effusion or pneumothorax is noted.  Considerable lymphadenopathy is noted encasing the vascular structures just above the aortic arch. These changes are stable in appearance from the prior study. No significant hilar adenopathy is noted. The thoracic aorta shows no findings to suggest   dissection or aneurysmal dilatation. Minimal atherosclerotic calcifications are seen. Although not optimized for pulmonary emboli evaluation no large central embolus is seen.  The upper abdomen there again noted changes consistent with hepatic metastatic disease. The dominant lesion in the right lobe is stable. There is suggestion of a smaller lesion in the lateral segment of left lobe of the liver posteriorly. Enlargement of the adrenal glands is noted bilaterally consistent with metastatic disease. Additionally large retrocrural lymph nodes are seen also consistent with localized metastatic disease. Sclerotic foci are again noted in the T8 and T9 vertebral bodies consistent with metastatic disease. Similar findings are noted in the L1, L3 and L4 vertebral bodies.  Review of the MIP images confirms the above findings.  IMPRESSION: No evidence of thoracic aortic dissection. No definitive pulmonary embolism is seen.  Changes consistent with the known history of left upper lobe lung carcinoma with metastatic disease to the mediastinal and retrocrural lymph nodes as  well as multiple thoracic and lumbar vertebra, adrenal glands and liver. Multiple small nodules are again noted in the left lower lobe.  No acute abnormality is noted.   Electronically Signed   By: Mark  Lukens M.D.   On: 03/01/2014 16:27    Impression:  The patient has had AMS of unclear etiology.  This occurred a few days after starting brain re-irradiation and Ritalin.   Plan:   There is no significant new edema in brain - recommend stopping steroids. Continue to hold Ritalin - I instructed family not to resume this as an outpatient.    After a lengthy discussion, patient, family and I concur it is best to discontinue RT to her brain. Palliative care has been helpful and having their ongoing involvement is recommended.  They see Dr. Mohamed later this month and will see how she is doing at that point when deciding whether to continue chemotherapy or not.  Hospice was discussed as an alternative today.  I 'll scheduled f/u with me in ~ 1mo; sooner if needed.  -----------------------------------   , MD 

## 2014-03-04 NOTE — Progress Notes (Signed)
[facility] PROGRESS NOTE  SHANEEQUA BAHNER OMB:[id number] DOB: August 24, 1940 DOA: 03/03/2014  PCP: Lujean Amel, MD  Brief HPI: 74 year old Caucasian female with metastatic lung cancer with brain metastases, undergoing chemotherapy and radiation treatment. She was admitted last week with complaints of chest pain and transient confusion. Pain was thought to be secondary to cancer. Confusion, resolved after few minutes. She was discharged home on Saturday. She presented again yesterday with the worsening confusion. She was admitted for further management.  Past medical history:  Past Medical History  Diagnosis Date  . Pneumonia, organism unspecified   . Status post chemotherapy  10/11/2012     carboplatin  . S/P radiation therapy 08/24/2012-09/11/2012    Whole Brain  / 32.5 Gy in 13 fractions  . 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 S/P radiation therapy  07/24/2012-08/10/2012      Mediastinum and Left Hilum / 35 Gy in 14 fractions      Consultants: oncology, radiation oncologist  Procedures: none  Antibiotics: none  Subjective: Patient somewhat slow to respond this morning. But she could identify me. She knew where she was. She could tell me the year and the month.  Objective: Vital Signs  Filed Vitals:   03/03/14 2300 03/04/14 0000 03/04/14 0109 03/04/14 0143  BP: 145/84 135/79 156/87 145/80  Pulse: 101 99 102 104  Temp:      TempSrc:      Resp: 16 15 18 14   Height:    5\' 7"  (1.702 m)  Weight:    47.945 kg (105 lb 11.2 oz)  SpO2: 96% 89% 99% 100%    Intake/Output Summary (Last 24 hours) at 03/04/14 0756 Last data filed at 03/04/14 4536  Gross per 24 hour  Intake    550 ml  Output      0 ml  Net    550 ml   Filed Weights   03/03/14 2202 03/04/14 0143  Weight: 48.081 kg (106 lb) 47.945 kg (105 lb 11.2 oz)     General appearance: alert, cooperative and no distress Resp: clear to auscultation bilaterally Cardio: regular rate and  rhythm, S1, S2 normal, no murmur, click, rub or gallop GI: soft, non-tender; bowel sounds normal; no masses,  no organomegaly Extremities: extremities normal, atraumatic, no cyanosis or edema Neurologic: she is alert. Oriented to person, year, month, place. No obvious cranial nerve deficits. Motor strength equal bilateral upper and lower extremities.  Lab Results:  Basic Metabolic Panel:  Recent Labs Lab 03/01/14 1402 03/02/14 0127 03/03/14 2211 03/04/14 0446  NA 131* 132* 129* 126*  K 4.3 3.4* 3.6 3.6  CL 95* 98 92* 93*  CO2 25 26 25 23   GLUCOSE 119* 94 132* 134*  BUN 22 18 24* 23  CREATININE 1.16* 1.11* 1.00 1.02  CALCIUM 9.4 8.7 9.8 8.7  MG  --   --   --  1.6  PHOS  --   --   --  4.1   Liver Function Tests:  Recent Labs Lab 03/02/14 0127 03/03/14 2211 03/04/14 0446  AST 28 30 26   ALT 18 46* 18  ALKPHOS 104 109 88  BILITOT 0.3 0.7 0.5  PROT 6.3 7.3 6.5  ALBUMIN 3.6 4.1 3.6    Recent Labs Lab 03/03/14 2212  AMMONIA 13   CBC:  Recent Labs Lab 03/01/14 1402 03/02/14 0127 03/03/14 2211 03/04/14 0446  WBC 9.0 9.9 6.6 9.2  NEUTROABS  --   --  6.1  --  HGB 12.3 11.0* 12.8 11.4*  HCT 37.0 33.5* 37.2 33.8*  MCV 91.4 91.0 88.8 89.2  PLT 404* 359 391 372   Cardiac Enzymes:  Recent Labs Lab 03/01/14 1954 03/02/14 0127 03/02/14 0709  TROPONINI <0.03 <0.03 <0.03   BNP (last 3 results)  Recent Labs  03/01/14 1402  BNP 84.7     Studies/Results: Ct Head W Wo Contrast  03/04/2014   CLINICAL DATA:  Weakness, change in behavior. History of metastatic lung cancer with treated brain metastasis.  EXAM: CT HEAD WITHOUT AND WITH CONTRAST  TECHNIQUE: Contiguous axial images were obtained from the base of the skull through the vertex without and with intravenous contrast  CONTRAST:  77mL OMNIPAQUE IOHEXOL 300 MG/ML  SOLN  COMPARISON:  CT of the head March 01, 2014 and MRI of the head October 08, 2013  FINDINGS: Moderate ventriculomegaly, likely on the  basis of global parenchymal brain volume loss as there is overall commensurate enlargement of cerebral sulci and cerebellar folia, unchanged. No intraparenchymal hemorrhage, mass effect, midline shift. Confluent supratentorial white matter hypodensities, similar to prior CT. No acute large vascular territory infarct. Faint enhancing 4 mm lesion in RIGHT frontal gray-white matter junction, axial 13 of 31, and appears new from prior MRI. Patient's known treated metastasis are difficult to discern on CT.  No abnormal extra-axial fluid collections or extra-axial enhancement, no definite extra-axial masses.  No paranasal sinus air-fluid levels. Mastoid air cells are well aerated. No skull fracture. Again noted is the lytic lesion within paracentral occipital calvarium. Ocular globes and orbital contents are nonsuspicious.  IMPRESSION: Enhancing 4 mm RIGHT frontal lobe lesion, concerning for new metastasis though, MRI of the brain with contrast would be more sensitive. Re- demonstration of osseous metastasis.  Moderate global parenchymal brain volume loss. Severe white matter changes, similar to prior examination may reflect sequelae of prior radiation.   Electronically Signed   By: Elon Alas   On: 03/04/2014 00:14    Medications:  Scheduled: . dexamethasone  10 mg Intravenous 4 times per day  . docusate sodium  100 mg Oral BID  . mirabegron ER  25 mg Oral Daily   Continuous: . sodium chloride 75 mL/hr at 03/04/14 0759   YDX:AJOINOMVEHMCN **OR** acetaminophen, ondansetron **OR** ondansetron (ZOFRAN) IV, oxyCODONE-acetaminophen, senna  Assessment/Plan:  Active Problems:   Small cell lung cancer   Brain metastases   Dehydration   Debility   Weakness   Encephalopathy    Acute encephalopathy Most likely due to brain metastases. CT scan suggested possible new lesion. MRI brain is pending. She remains on steroids. No evidence for infection.   Small cell lung cancer with brain metastases She  is receiving chemotherapy and radiation treatment. She was seen by palliative care team recently and she is a DO NOT RESUSCITATE. Oncology is following. Radiation treatment ongoing. MRI of the brain is pending. Continue dexamethasone. Discussed with Dr. Julien Nordmann this morning.  Chest pain This has been ongoing for many months. She was hospitalized recently for same. She had a negative cardiac workup. CT scan was negative for PE. The medications as needed.  Elevated blood pressure Possibly due to Ritalin. Blood pressure is stable, though is elevated. Treat as needed. She was discharged recently on a beta blocker which had been held for now. Monitor.  Hyponatremia Continue IV fluids. 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 Monitor levels closely.   DVT Prophylaxis: SCDs    Code Status: DO NOT RESUSCITATE  Family Communication: discussed with the patient  and her daughter  Disposition Plan: not ready for discharge    LOS: 0 days   [facility] Pager [phone] 03/04/2014, 7:56 AM  If 7PM-7AM, please contact night-coverage at [email or web address], password [facility]

## 2014-03-04 NOTE — ED Notes (Signed)
Admitting MD at bedside.

## 2014-03-04 NOTE — ED Provider Notes (Signed)
Patient seen and evaluated. Discussed with PA. Patient admitted with an episode of aphasia 2 days ago also some chest pain. Noncontrast CT of the brain was normal. No abnormalities Her chest pain. She returned home yesterday and she was awake and alert and active and functional. However today she is intermittently become aphasic has recurrent weakness the point that she requires assist by 2 adult family members to be mandatory at home and is more somnolent and requires arousal to take even small amounts of by mouth.  Contrast CT shows no signs of acute edema or asymmetry. I discussed at length with family at any of her multiple metastases could explain some of her symptoms. She also did just started chemotherapy last Monday for one dose per day until Thursday, the day before her before symptoms started.      Tanna Furry, MD 03/04/14 Shelah Lewandowsky

## 2014-03-05 ENCOUNTER — Ambulatory Visit: Payer: Medicare Other

## 2014-03-05 ENCOUNTER — Ambulatory Visit
Admit: 2014-03-05 | Discharge: 2014-03-05 | Disposition: A | Discharge status: Home / Self Care | Payer: Medicare Other | Attending: Radiation Oncology | Admitting: Radiation Oncology

## 2014-03-05 ENCOUNTER — Telehealth: Payer: Self-pay | Admitting: *Deleted

## 2014-03-05 DIAGNOSIS — G939 Disorder of brain, unspecified: Secondary | ICD-10-CM

## 2014-03-05 DIAGNOSIS — G9340 Encephalopathy, unspecified: Secondary | ICD-10-CM | POA: Insufficient documentation

## 2014-03-05 DIAGNOSIS — E871 Hypo-osmolality and hyponatremia: Secondary | ICD-10-CM | POA: Insufficient documentation

## 2014-03-05 DIAGNOSIS — C3402 Malignant neoplasm of left main bronchus: Secondary | ICD-10-CM

## 2014-03-05 DIAGNOSIS — Z66 Do not resuscitate: Secondary | ICD-10-CM

## 2014-03-05 DIAGNOSIS — R531 Weakness: Secondary | ICD-10-CM

## 2014-03-05 DIAGNOSIS — C787 Secondary malignant neoplasm of liver and intrahepatic bile duct: Secondary | ICD-10-CM

## 2014-03-05 DIAGNOSIS — C7931 Secondary malignant neoplasm of brain: Principal | ICD-10-CM

## 2014-03-05 DIAGNOSIS — Z515 Encounter for palliative care: Secondary | ICD-10-CM

## 2014-03-05 DIAGNOSIS — J90 Pleural effusion, not elsewhere classified: Secondary | ICD-10-CM

## 2014-03-05 LAB — BASIC METABOLIC PANEL
Anion gap: 9 (ref 5–15)
BUN: 29 mg/dL — AB (ref 6–23)
CALCIUM: 8.8 mg/dL (ref 8.4–10.5)
CO2: 22 mmol/L (ref 19–32)
Chloride: 102 mmol/L (ref 96–112)
Creatinine, Ser: 1.2 mg/dL — ABNORMAL HIGH (ref 0.50–1.10)
GFR calc Af Amer: 50 mL/min — ABNORMAL LOW (ref >90)
GFR, EST NON AFRICAN AMERICAN: 43 mL/min — AB (ref >90)
GLUCOSE: 138 mg/dL — AB (ref 70–99)
POTASSIUM: 3.9 mmol/L (ref 3.5–5.1)
SODIUM: 133 mmol/L — AB (ref 135–145)

## 2014-03-05 LAB — CBC
HEMATOCRIT: 30.4 % — AB (ref 36.0–46.0)
Hemoglobin: 10.3 g/dL — ABNORMAL LOW (ref 12.0–15.0)
MCH: 30.7 pg (ref 26.0–34.0)
MCHC: 33.9 g/dL (ref 30.0–36.0)
MCV: 90.5 fL (ref 78.0–100.0)
Platelets: 334 10*3/uL (ref 150–400)
RBC: 3.36 MIL/uL — AB (ref 3.87–5.11)
RDW: 15 % (ref 11.5–15.5)
WBC: 13.5 10*3/uL — ABNORMAL HIGH (ref 4.0–10.5)

## 2014-03-05 MED ORDER — OXYCODONE-ACETAMINOPHEN 5-325 MG PO TABS
1.0000 | ORAL_TABLET | ORAL | Status: DC | PRN
Start: 2014-03-05 — End: 2014-03-09
  Administered 2014-03-06 – 2014-03-07 (×2): 2 via ORAL
  Administered 2014-03-07 – 2014-03-08 (×2): 1 via ORAL
  Administered 2014-03-08: 2 via ORAL
  Administered 2014-03-08: 1 via ORAL
  Filled 2014-03-05 (×3): qty 1
  Filled 2014-03-05: qty 2
  Filled 2014-03-05: qty 1
  Filled 2014-03-05: qty 2
  Filled 2014-03-05: qty 1

## 2014-03-05 MED ORDER — POLYETHYLENE GLYCOL 3350 17 G PO PACK
17.0000 g | PACK | Freq: Two times a day (BID) | ORAL | Status: DC
  Administered 2014-03-05 – 2014-03-08 (×5): 17 g via ORAL
  Filled 2014-03-05 (×10): qty 1

## 2014-03-05 MED ORDER — SODIUM CHLORIDE 0.9 % IV SOLN
INTRAVENOUS | Status: DC
  Administered 2014-03-05: 09:00:00 via INTRAVENOUS

## 2014-03-05 MED ORDER — SENNOSIDES-DOCUSATE SODIUM 8.6-50 MG PO TABS
1.0000 | ORAL_TABLET | Freq: Two times a day (BID) | ORAL | Status: DC
  Administered 2014-03-05 – 2014-03-09 (×7): 1 via ORAL
  Filled 2014-03-05 (×10): qty 1

## 2014-03-05 MED ORDER — SENNOSIDES-DOCUSATE SODIUM 8.6-50 MG PO TABS: 2.0000 | ORAL_TABLET | Freq: Every day | ORAL | Status: DC

## 2014-03-05 MED ORDER — BISACODYL 10 MG RE SUPP
10.0000 mg | RECTAL | Status: AC
  Administered 2014-03-05: 10 mg via RECTAL
  Filled 2014-03-05: qty 1

## 2014-03-05 MED ORDER — DEXAMETHASONE SODIUM PHOSPHATE 10 MG/ML IJ SOLN
4.0000 mg | Freq: Two times a day (BID) | INTRAMUSCULAR | Status: DC
  Administered 2014-03-05 – 2014-03-06 (×2): 4 mg via INTRAVENOUS
  Filled 2014-03-05: qty 1
  Filled 2014-03-05 (×2): qty 0.4

## 2014-03-05 MED ORDER — DEXAMETHASONE SODIUM PHOSPHATE 10 MG/ML IJ SOLN
4.0000 mg | Freq: Four times a day (QID) | INTRAMUSCULAR | Status: DC
  Administered 2014-03-05: 4 mg via INTRAVENOUS
  Filled 2014-03-05: qty 1

## 2014-03-05 NOTE — Evaluation (Signed)
Physical Therapy Evaluation Patient Details Name: Cheryl Nielsen MRN: [id number] DOB: 09/04/1940 Today's Date: 03/05/2014   History of Present Illness  74 yo female admitted with weakness, encephalopathy. Hx of lung cancer-undergoing chemo, brain mets.   Clinical Impression  On eval, pt required Min assist for mobility-able to walk ~60 feet with RW. Unsteady and at high risk for falls when standing/ambulating. Pt has poor awareness of deficits. Recommend SNF for rehab.     Follow Up Recommendations SNF;Supervision/Assistance - 24 hour    Equipment Recommendations  Rolling walker with 5" wheels    Recommendations for Other Services OT consult     Precautions / Restrictions Precautions Precautions: Fall Restrictions Weight Bearing Restrictions: No      Mobility  Bed Mobility Overal bed mobility: Needs Assistance Bed Mobility: Sit to Supine       Sit to supine: Min guard   General bed mobility comments: close guard for safety. Increased time.   Transfers Overall transfer level: Needs assistance   Transfers: Sit to/from Stand Sit to Stand: Min assist         General transfer comment: Assist to rise, stabilize, control descent. VCS safety, hand placement. LOB posteriorly especially with initial standing-external assist to prevent fall.   Ambulation/Gait Ambulation/Gait assistance: Min assist Ambulation Distance (Feet): 60 Feet Assistive device: Rolling walker (2 wheeled) Gait Pattern/deviations: Step-through pattern;Decreased stride length;Trunk flexed;Decreased step length - left;Decreased step length - right     General Gait Details: slow gait speed. Intermittent near shuffle gait pattern. Assist to stabilize and maneuver with RW.   Stairs            Wheelchair Mobility    Modified Rankin (Stroke Patients Only)       Balance Overall balance assessment: Needs assistance;History of Falls Sitting-balance support: No upper extremity supported;Feet  supported Sitting balance-Leahy Scale: Fair     Standing balance support: Bilateral upper extremity supported;During functional activity Standing balance-Leahy Scale: Poor                               Pertinent Vitals/Pain Pain Assessment: No/denies pain    Home Living Family/patient expects to be discharged to:: Private residence Living Arrangements: Alone Available Help at Discharge: Family Type of Home: House Home Access: Stairs to enter Entrance Stairs-Rails: Right Entrance Stairs-Number of Steps: 6 Home Layout: Two level;Bed/bath upstairs Home Equipment: Walker - 2 wheels      Prior Function Level of Independence: Independent               Hand Dominance        Extremity/Trunk Assessment   Upper Extremity Assessment: Defer to OT evaluation           Lower Extremity Assessment: Generalized weakness      Cervical / Trunk Assessment: Kyphotic  Communication   Communication: No difficulties  Cognition Arousal/Alertness: Awake/alert Behavior During Therapy: WFL for tasks assessed/performed   Area of Impairment: Memory;Problem solving;Safety/judgement;Following commands     Memory: Decreased short-term memory Following Commands: Follows one step commands with increased time Safety/Judgement: Decreased awareness of safety;Decreased awareness of deficits   Problem Solving: Slow processing;Requires tactile cues;Requires verbal cues      General Comments      Exercises        Assessment/Plan    PT Assessment Patient needs continued PT services  PT Diagnosis Difficulty walking;Generalized weakness;Altered mental status   PT Problem List Decreased strength;Decreased activity tolerance;Decreased balance;Decreased  mobility;Decreased knowledge of use of DME;Pain;Decreased safety awareness  PT Treatment Interventions DME instruction;Gait training;Functional mobility training;Therapeutic activities;Patient/family education;Balance  training   PT Goals (Current goals can be found in the Care Plan section) Acute Rehab PT Goals Patient Stated Goal: none stated PT Goal Formulation: With patient Time For Goal Achievement: 03/19/14 Potential to Achieve Goals: Good    Frequency Min 3X/week   Barriers to discharge        Co-evaluation               End of Session Equipment Utilized During Treatment: Gait belt Activity Tolerance: Patient limited by fatigue Patient left: in bed;with call bell/phone within reach           Time: 1415-1443 PT Time Calculation (min) (ACUTE ONLY): 28 min   Charges:   PT Evaluation $Initial PT Evaluation Tier I: 1 Procedure PT Treatments $Gait Training: 8-22 mins   PT G Codes:        Weston Anna, MPT Pager: [phone]

## 2014-03-05 NOTE — Progress Notes (Signed)
[facility] PROGRESS NOTE  Cheryl Nielsen YOV:[id number] DOB: 04/14/1940 DOA: 03/03/2014  PCP: Lujean Amel, MD  Brief HPI: 75 year old Caucasian female with metastatic lung cancer with brain metastases, undergoing chemotherapy and radiation treatment. She was admitted last week with complaints of chest pain and transient confusion. Pain was thought to be secondary to cancer. Confusion, resolved after few minutes. She was discharged home on Saturday. She presented again yesterday with the worsening confusion. She was admitted for further management.  Past medical history:  Past Medical History  Diagnosis Date  . Pneumonia, organism unspecified   . Status post chemotherapy  10/11/2012     carboplatin  . S/P radiation therapy 08/24/2012-09/11/2012    Whole Brain  / 32.5 Gy in 13 fractions  . 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 S/P radiation therapy  07/24/2012-08/10/2012      Mediastinum and Left Hilum / 35 Gy in 14 fractions      Consultants: oncology, radiation oncologist, Palliative medicine  Procedures: none  Antibiotics: none  Subjective: Patient slightly confused. Daughter is at bedside. She denies any pain currently. But did have a lot of pain in her left side last night. No nausea, vomiting.   Objective: Vital Signs  Filed Vitals:   03/04/14 0109 03/04/14 0143 03/04/14 2048 03/05/14 0510  BP: 156/87 145/80 164/83 137/72  Pulse: 102 104 110   Temp:   98 F (36.7 C) 98.2 F (36.8 C)  TempSrc:   Oral Oral  Resp: 18 14 14 14   Height:  5\' 7"  (1.702 m)    Weight:  47.945 kg (105 lb 11.2 oz)    SpO2: 99% 100% 98% 97%    Intake/Output Summary (Last 24 hours) at 03/05/14 0750 Last data filed at 03/04/14 2300  Gross per 24 hour  Intake    600 ml  Output    500 ml  Net    100 ml   Filed Weights   03/03/14 2202 03/04/14 0143  Weight: 48.081 kg (106 lb) 47.945 kg (105 lb 11.2 oz)     General appearance: alert, cooperative and  no distress Resp: clear to auscultation bilaterally Cardio: regular rate and rhythm, S1, S2 normal, no murmur, click, rub or gallop GI: soft, non-tender; bowel sounds normal; no masses,  no organomegaly Extremities: extremities normal, atraumatic, no cyanosis or edema Neurologic: she is alert but somewhat confused. Distracted and slow to respond.. Oriented to person, year, place. No obvious cranial nerve deficits. Motor strength equal bilateral upper and lower extremities.  Lab Results:  Basic Metabolic Panel:  Recent Labs Lab 03/01/14 1402 03/02/14 0127 03/03/14 2211 03/04/14 0446 03/05/14 0423  NA 131* 132* 129* 126* 133*  K 4.3 3.4* 3.6 3.6 3.9  CL 95* 98 92* 93* 102  CO2 25 26 25 23 22   GLUCOSE 119* 94 132* 134* 138*  BUN 22 18 24* 23 29*  CREATININE 1.16* 1.11* 1.00 1.02 1.20*  CALCIUM 9.4 8.7 9.8 8.7 8.8  MG  --   --   --  1.6  --   PHOS  --   --   --  4.1  --    Liver Function Tests:  Recent Labs Lab 03/02/14 0127 03/03/14 2211 03/04/14 0446  AST 28 30 26   ALT 18 46* 18  ALKPHOS 104 109 88  BILITOT 0.3 0.7 0.5  PROT 6.3 7.3 6.5  ALBUMIN 3.6 4.1 3.6    Recent Labs Lab 03/03/14 2212  AMMONIA 13  CBC:  Recent Labs Lab 03/01/14 1402 03/02/14 0127 03/03/14 2211 03/04/14 0446 03/05/14 0423  WBC 9.0 9.9 6.6 9.2 13.5*  NEUTROABS  --   --  6.1  --   --   HGB 12.3 11.0* 12.8 11.4* 10.3*  HCT 37.0 33.5* 37.2 33.8* 30.4*  MCV 91.4 91.0 88.8 89.2 90.5  PLT 404* 359 391 372 334   Cardiac Enzymes:  Recent Labs Lab 03/01/14 1954 03/02/14 0127 03/02/14 0709  TROPONINI <0.03 <0.03 <0.03   BNP (last 3 results)  Recent Labs  03/01/14 1402  BNP 84.7     Studies/Results: Ct Head W Wo Contrast  03/04/2014   CLINICAL DATA:  Weakness, change in behavior. History of metastatic lung cancer with treated brain metastasis.  EXAM: CT HEAD WITHOUT AND WITH CONTRAST  TECHNIQUE: Contiguous axial images were obtained from the base of the skull through the  vertex without and with intravenous contrast  CONTRAST:  56mL OMNIPAQUE IOHEXOL 300 MG/ML  SOLN  COMPARISON:  CT of the head March 01, 2014 and MRI of the head October 08, 2013  FINDINGS: Moderate ventriculomegaly, likely on the basis of global parenchymal brain volume loss as there is overall commensurate enlargement of cerebral sulci and cerebellar folia, unchanged. No intraparenchymal hemorrhage, mass effect, midline shift. Confluent supratentorial white matter hypodensities, similar to prior CT. No acute large vascular territory infarct. Faint enhancing 4 mm lesion in RIGHT frontal gray-white matter junction, axial 13 of 31, and appears new from prior MRI. Patient's known treated metastasis are difficult to discern on CT.  No abnormal extra-axial fluid collections or extra-axial enhancement, no definite extra-axial masses.  No paranasal sinus air-fluid levels. Mastoid air cells are well aerated. No skull fracture. Again noted is the lytic lesion within paracentral occipital calvarium. Ocular globes and orbital contents are nonsuspicious.  IMPRESSION: Enhancing 4 mm RIGHT frontal lobe lesion, concerning for new metastasis though, MRI of the brain with contrast would be more sensitive. Re- demonstration of osseous metastasis.  Moderate global parenchymal brain volume loss. Severe white matter changes, similar to prior examination may reflect sequelae of prior radiation.   Electronically Signed   By: Elon Alas   On: 03/04/2014 00:14   Mr Brain W Contrast  03/04/2014   CLINICAL DATA:  74 year old female with metastatic lung cancer status post radiation therapy. Restaging. Subsequent encounter.  EXAM: MRI HEAD WITH CONTRAST  TECHNIQUE: Multiplanar, multiecho pulse sequences of the brain and surrounding structures were obtained with intravenous contrast.  COMPARISON:  Head CT 01/01/2015.  Brain MRI 02/12/2014.  CONTRAST:  65mL MULTIHANCE GADOBENATE DIMEGLUMINE 529 MG/ML IV SOLN  FINDINGS: Today's study  a 1.5 Tesla using affect her post-contrast images than the 3 Tesla study on 07/08/2014.  Widely scattered small brain metastases re- identified. No definite new or progressed brain metastasis identified. Several of the smallest metastases identified previously may have mildly regressed column a or may be less visible due to technical factors on today's study.  No associated intracranial mass affect. Confluent cerebral white matter T2 and FLAIR hyperintensity suggesting sequelae of prior whole brain radiation is stable. No new or increased cerebral edema.  Osseous metastatic disease to the skull not significantly changed. No dural thickening or hyper enhancement identified.  There is a punctate focus of restricted diffusion without enhancement at the anterior inferior left basal ganglia seen on series 5 image 21 and series 6, image 26. There is mild associated T2 and FLAIR hyperintensity which is new. Series 8, image 11. No other  diffusion abnormality.  Major intracranial vascular flow voids are stable. No ventriculomegaly. Stable small micro hemorrhage at the right vertex. No acute intracranial hemorrhage identified. Negative pituitary. Negative visualized spinal cord. Orbits, paranasal sinuses, and visualized internal auditory structures appear stable. Visualized scalp soft tissues are within normal limits.  IMPRESSION: 1. Punctate nonenhancing focus of restricted diffusion in the inferior left basal ganglia, presumed acute lacunar infarct. No mass effect or hemorrhage. 2. No new or progressive brain metastasis identified. Stable or slight regression of metastatic Brain disease since 02/12/2014 allowing for technical differences between this exam and the prior. 3. No significant change in osseous metastatic disease to the skull.   Electronically Signed   By: Genevie Ann M.D.   On: 03/04/2014 14:50    Medications:  Scheduled: . dexamethasone  4 mg Intravenous 4 times per day  . docusate sodium  100 mg Oral BID  .  mirabegron ER  25 mg Oral Daily  . polyethylene glycol  17 g Oral BID   Continuous: . sodium chloride     WUJ:[id number] **OR** acetaminophen, ondansetron **OR** ondansetron (ZOFRAN) IV, oxyCODONE-acetaminophen, senna  Assessment/Plan:  Active Problems:   Small cell lung cancer   Brain metastases   Dehydration   Debility   Weakness   Encephalopathy   Brain metastases    Acute encephalopathy Most likely due to brain metastases. CT scan suggested possible new lesion. MRI brain does not reveal any new metastatic process. It did show an acute infarct in the basal ganglion. This could be the reason for change in mental status. However, she is not a candidate for further workup of the same. This could be a delayed complication of radiation or could be due to a de novo vascular process. Discussed with the patient and her family. They did not want further workup. There is no evidence for infection.   Small cell lung cancer with brain metastases She was receiving chemotherapy and radiation treatment. She was seen by palliative care team recently and she is a DO NOT RESUSCITATE. Oncology is following. She was also seen by radiation oncologist at this time. Radiation has been discontinued. MRI brain as above. No new lesions seen. Discussed the use of steroid with the oncology. Dr. Julien Nordmann feels that she may have some benefit by continuing dexamethasone. We will, however, decrease the dose.   Chest pain secondary to cancer This has been ongoing for many months. She was hospitalized recently for same. She had a negative cardiac workup. CT scan was negative for PE. Pain medications as needed.  Elevated blood pressure Possibly due to Ritalin. Blood pressure is stable. Treat as needed. She was discharged recently on a beta blocker which had been held for now. Monitor. Ritalin has been discontinued.  Hyponatremia/elevated BUN/creatinine Continue IV fluids. Sodium level has improved. Monitor renal  function closely. Monitor urine output. UA did show high specific gravity. 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   Elevated WBC, likely due to steroids.  DVT Prophylaxis: SCDs    Code Status: DO NOT RESUSCITATE  Family Communication: discussed with the patient and her daughter  Disposition Plan: PT and OT to evaluate. Patient and family want to pursue rehabilitation if indicated. Await palliative medicine input as well.    LOS: 1 day   [facility] Pager [phone] 03/05/2014, 7:50 AM  If 7PM-7AM, please contact night-coverage at [email or web address], password [facility]

## 2014-03-05 NOTE — Telephone Encounter (Signed)
CALLED PATIENT TO INFORM OF FU FOR 04-05-14 @ 4:20 PM, LVM FOR A RETURN CALL

## 2014-03-05 NOTE — Progress Notes (Signed)
Pt had a moderately-sized bowel movement after receiving sennakot and dulcolax suppository this afternoon

## 2014-03-05 NOTE — Progress Notes (Signed)
DIAGNOSIS AND STAGE: Extensive stage small cell lung cancer with large obstructing Center left lung mass with large left pleural effusion, liver and brain metastasis diagnosed in June of 2014   PRIOR THERAPY:  1) Status post whole brain irradiation under the care of Dr. Isidore Moos completed 09/11/2012.  2) whole brain irradiation under the care of Dr. Isidore Moos expected to be completed on 09/11/2012.  3) Systemic chemotherapy with carboplatin for AUC of 5 on day 1 and etoposide 120 mg/M2 on days 1, 2 and 3 with Neulasta support on day 4, status post 4 cycles, last cycle was given on 10/11/2012 with partial response.  4) Systemic chemotherapy with cisplatin 30 mg/M2 and irinotecan 65 mg/M2 on days 1 and 8 every 3 weeks, status post 9 cycles. First dose 01/16/2013. Last dose was given 10/18/2013. 5) Temodar 150 mg/M2 on days 1-5 every 4 weeks. She started the first dose of her treatment 11/14/2013. Status post 2 months of treatment discontinued today secondary to disease progression  CURRENT THERAPY: Systemic chemotherapy again with carboplatin for AUC of 5 on day 1 and etoposide 100 MG/M2 on days 1, 2 and 3 with Neulasta support on day 4. First cycle started on 01/15/2014. Status post 2 cycles  CHEMOTHERAPY INTENT: Palliative  CURRENT # OF CHEMOTHERAPY CYCLES: 2 CURRENT ANTIEMETICS: Zofran, dexamethasone and Compazine  CURRENT SMOKING STATUS: Former smoker  ORAL CHEMOTHERAPY AND CONSENT: Temodar 250 mg for 5 days every 4 weeks. First dose 11/14/2013. CURRENT BISPHOSPHONATES USE: None  PAIN MANAGEMENT: 0/10 on Vicodin  NARCOTICS INDUCED CONSTIPATION: None  LIVING WILL AND CODE STATUS: no CODE BLUE    Subjective: The patient is seen and examined today. She is feeling little bit better today. She was admitted over the weekend complaining of significant fatigue and weakness as well as confusion.  Recent CT angiogram of the chest on 03/01/2014 showed no evidence of pulmonary embolism or  thoracic aortic dissection and no significant evidence for disease progression. MRI of the brain performed yesterday showed punctate nonenhancing focus of restricted diffusion in the inferior left basal ganglia concerning for acute lacunar infarct with no mass effect or hemorrhage. No new or progressive pain metastasis identified. The patient was started empirically on Decadron 4 mg every 6 hours and she felt a little bit better. She is less confused this morning.  Objective: Vital signs in last 24 hours: Temp:  [98 F (36.7 C)-98.2 F (36.8 C)] 98.2 F (36.8 C) (02/16 0510) Pulse Rate:  [110] 110 (02/15 2048) Resp:  [14] 14 (02/16 0510) BP: (137-164)/(72-83) 137/72 mmHg (02/16 0510) SpO2:  [97 %-98 %] 97 % (02/16 0510) Weight:  [105 lb (47.628 kg)] 105 lb (47.628 kg) (02/15 1325)  Intake/Output from previous day: 02/15 0701 - 02/16 0700 In: 600  Out: 500 [Urine:500] Intake/Output this shift:    General appearance: alert, cooperative, fatigued and no distress Resp: clear to auscultation bilaterally Cardio: regular rate and rhythm, S1, S2 normal, no murmur, click, rub or gallop GI: soft, non-tender; bowel sounds normal; no masses,  no organomegaly Extremities: extremities normal, atraumatic, no cyanosis or edema  Lab Results:   Recent Labs  03/04/14 0446 03/05/14 0423  WBC 9.2 13.5*  HGB 11.4* 10.3*  HCT 33.8* 30.4*  PLT 372 334   BMET  Recent Labs  03/04/14 0446 03/05/14 0423  NA 126* 133*  K 3.6 3.9  CL 93* 102  CO2 23 22  GLUCOSE 134* 138*  BUN 23 29*  CREATININE 1.02 1.20*  CALCIUM 8.7 8.8  Studies/Results: Ct Head W Wo Contrast  03/04/2014   CLINICAL DATA:  Weakness, change in behavior. History of metastatic lung cancer with treated brain metastasis.  EXAM: CT HEAD WITHOUT AND WITH CONTRAST  TECHNIQUE: Contiguous axial images were obtained from the base of the skull through the vertex without and with intravenous contrast  CONTRAST:  89mL OMNIPAQUE  IOHEXOL 300 MG/ML  SOLN  COMPARISON:  CT of the head March 01, 2014 and MRI of the head October 08, 2013  FINDINGS: Moderate ventriculomegaly, likely on the basis of global parenchymal brain volume loss as there is overall commensurate enlargement of cerebral sulci and cerebellar folia, unchanged. No intraparenchymal hemorrhage, mass effect, midline shift. Confluent supratentorial white matter hypodensities, similar to prior CT. No acute large vascular territory infarct. Faint enhancing 4 mm lesion in RIGHT frontal gray-white matter junction, axial 13 of 31, and appears new from prior MRI. Patient's known treated metastasis are difficult to discern on CT.  No abnormal extra-axial fluid collections or extra-axial enhancement, no definite extra-axial masses.  No paranasal sinus air-fluid levels. Mastoid air cells are well aerated. No skull fracture. Again noted is the lytic lesion within paracentral occipital calvarium. Ocular globes and orbital contents are nonsuspicious.  IMPRESSION: Enhancing 4 mm RIGHT frontal lobe lesion, concerning for new metastasis though, MRI of the brain with contrast would be more sensitive. Re- demonstration of osseous metastasis.  Moderate global parenchymal brain volume loss. Severe white matter changes, similar to prior examination may reflect sequelae of prior radiation.   Electronically Signed   By: Elon Alas   On: 03/04/2014 00:14   Mr Brain W Contrast  03/04/2014   CLINICAL DATA:  74 year old female with metastatic lung cancer status post radiation therapy. Restaging. Subsequent encounter.  EXAM: MRI HEAD WITH CONTRAST  TECHNIQUE: Multiplanar, multiecho pulse sequences of the brain and surrounding structures were obtained with intravenous contrast.  COMPARISON:  Head CT 01/01/2015.  Brain MRI 02/12/2014.  CONTRAST:  53mL MULTIHANCE GADOBENATE DIMEGLUMINE 529 MG/ML IV SOLN  FINDINGS: Today's study a 1.5 Tesla using affect her post-contrast images than the 3 Tesla study  on 07/08/2014.  Widely scattered small brain metastases re- identified. No definite new or progressed brain metastasis identified. Several of the smallest metastases identified previously may have mildly regressed column a or may be less visible due to technical factors on today's study.  No associated intracranial mass affect. Confluent cerebral white matter T2 and FLAIR hyperintensity suggesting sequelae of prior whole brain radiation is stable. No new or increased cerebral edema.  Osseous metastatic disease to the skull not significantly changed. No dural thickening or hyper enhancement identified.  There is a punctate focus of restricted diffusion without enhancement at the anterior inferior left basal ganglia seen on series 5 image 21 and series 6, image 26. There is mild associated T2 and FLAIR hyperintensity which is new. Series 8, image 11. No other diffusion abnormality.  Major intracranial vascular flow voids are stable. No ventriculomegaly. Stable small micro hemorrhage at the right vertex. No acute intracranial hemorrhage identified. Negative pituitary. Negative visualized spinal cord. Orbits, paranasal sinuses, and visualized internal auditory structures appear stable. Visualized scalp soft tissues are within normal limits.  IMPRESSION: 1. Punctate nonenhancing focus of restricted diffusion in the inferior left basal ganglia, presumed acute lacunar infarct. No mass effect or hemorrhage. 2. No new or progressive brain metastasis identified. Stable or slight regression of metastatic Brain disease since 02/12/2014 allowing for technical differences between this exam and the prior. 3. No  significant change in osseous metastatic disease to the skull.   Electronically Signed   By: Genevie Ann M.D.   On: 03/04/2014 14:50    Medications: I have reviewed the patient's current medications.  CODE STATUS: No CODE BLUE  Assessment/Plan: 1) extensive stage small cell lung cancer status post several chemotherapy  regimens. She is currently on repeat course of carboplatin and etoposide status post 2 cycles. Her treatment is currently on hold until completion of the brain irradiation. She is expected to start the third cycle on 03/18/2014. 2) recurrent metastatic brain lesions: Currently undergoing whole brain irradiation.  3) confusion and mental status change: Secondary to her metastatic brain lesions as well as questionable new lacunar infarct. She felt much better after starting Decadron. We will adjust her dose to 4 mg every 12 hour. 4) weakness and fatigue: The patient may benefit from discharged to a rehabilitation facility for physical therapy. Thank you so much for taking good care of Mrs. Goedken, I will continue to follow the patient with you and assist in her management an as-needed basis.   LOS: 1 day    Jahmari Esbenshade K. 03/05/2014

## 2014-03-05 NOTE — Progress Notes (Signed)
Rn noted Pt had a port, offered to get it accessed but pt was very adamant and "said was just for her chemo" daughter was at bedside and told Rn not to access it.

## 2014-03-06 ENCOUNTER — Ambulatory Visit: Payer: Medicare Other

## 2014-03-06 LAB — BASIC METABOLIC PANEL
Anion gap: 10 (ref 5–15)
BUN: 31 mg/dL — ABNORMAL HIGH (ref 6–23)
CALCIUM: 8.8 mg/dL (ref 8.4–10.5)
CHLORIDE: 100 mmol/L (ref 96–112)
CO2: 26 mmol/L (ref 19–32)
CREATININE: 1.03 mg/dL (ref 0.50–1.10)
GFR calc Af Amer: 61 mL/min — ABNORMAL LOW (ref >90)
GFR calc non Af Amer: 52 mL/min — ABNORMAL LOW (ref >90)
Glucose, Bld: 106 mg/dL — ABNORMAL HIGH (ref 70–99)
POTASSIUM: 3.7 mmol/L (ref 3.5–5.1)
SODIUM: 136 mmol/L (ref 135–145)

## 2014-03-06 MED ORDER — HYDRALAZINE HCL 20 MG/ML IJ SOLN
10.0000 mg | Freq: Once | INTRAMUSCULAR | Status: AC
  Administered 2014-03-06: 10 mg via INTRAVENOUS
  Filled 2014-03-06: qty 1

## 2014-03-06 MED ORDER — KETOROLAC TROMETHAMINE 30 MG/ML IJ SOLN: 30.0000 mg | Freq: Once | INTRAMUSCULAR | Status: DC

## 2014-03-06 MED ORDER — HYDRALAZINE HCL 20 MG/ML IJ SOLN
10.0000 mg | Freq: Once | INTRAMUSCULAR | Status: AC
  Administered 2014-03-07: 10 mg via INTRAVENOUS
  Filled 2014-03-06: qty 1

## 2014-03-06 MED ORDER — DIPHENHYDRAMINE HCL 50 MG/ML IJ SOLN
25.0000 mg | Freq: Once | INTRAMUSCULAR | Status: AC
  Administered 2014-03-06: 25 mg via INTRAVENOUS
  Filled 2014-03-06: qty 1

## 2014-03-06 MED ORDER — METOCLOPRAMIDE HCL 5 MG/ML IJ SOLN
10.0000 mg | Freq: Once | INTRAMUSCULAR | Status: AC
  Administered 2014-03-06: 10 mg via INTRAVENOUS
  Filled 2014-03-06: qty 2

## 2014-03-06 MED ORDER — DEXAMETHASONE 4 MG PO TABS
4.0000 mg | ORAL_TABLET | Freq: Two times a day (BID) | ORAL | Status: DC
  Administered 2014-03-06 – 2014-03-09 (×6): 4 mg via ORAL
  Filled 2014-03-06 (×8): qty 1

## 2014-03-06 NOTE — Progress Notes (Signed)
Clinical Social Work Department BRIEF PSYCHOSOCIAL ASSESSMENT 03/06/2014  Patient:  Cheryl Nielsen, Cheryl Nielsen     Account Number:  [id number]     Admit date:  03/03/2014  Clinical Social Worker:  Maryln Manuel  Date/Time:  03/06/2014 10:30 AM  Referred by:  Physician  Date Referred:  03/06/2014 Referred for  SNF Placement   Other Referral:   Interview type:  Patient Other interview type:   and patient family at bedside    PSYCHOSOCIAL DATA Living Status:  ALONE Admitted from facility:   Level of care:   Primary support name:  [name] Ramsey/daughter/ HCPOA/[phone] Primary support relationship to patient:  CHILD, ADULT Degree of support available:   strong    CURRENT CONCERNS Current Concerns  Post-Acute Placement   Other Concerns:    SOCIAL WORK ASSESSMENT / PLAN CSW received referral for New SNF.    CSW met with pt, pt two daughters, pt son, and pt close friend at bedside. Pt expressed that she was comfortable with CSW having conversation with pt in family/friend presence. CSW introduced self and explained role. Pt expressed that she lives alone, but has 24 hour assistance at home. CSW discussed with pt recommendation for short term rehab at [facility]. CSW explored pt feelings surrounding going to short term rehab. Pt stated that she does not want to go to SNF and states, "I don't know how it will benefit me". CSW discussed with pt benefits of short term rehab especially surrounding pt getting stronger in order to undergo chemotherapy planned for 03/18/14. Pt discussed that she was made aware from Dr. Earlie Server yesterday of the plan to do chemotherapy on 2/29. Pt family expressed that they weren't sure about the chemotherapy, but now that they are aware of the plans for chemotherapy they definitely hope pt will be agreeable to SNF for rehab. Pt stated that she is agreeable to CSW exploring SNF options in order for pt to make a decision about SNF for rehab vs. home with home health. Pt family  supportive and discussed with pt that they will be able to tour facilities and assist pt with making decision. Palliative Medicine NP, Wadie Lessen entered room at this time to meet with pt and pt family.    CSW completed FL2 and initiated SNF search to [facility]. Pt and pt family had expressed interest in [facility] and [facility] contacted facility and left message. Awaiting call back.    CSW to follow up with pt and pt family re: SNF bed offers in order for pt and pt family to make decision about disposition plan.    CSW to continue to follow to provide support and assist with pt discharge planning needs.   Assessment/plan status:  Psychosocial Support/Ongoing Assessment of Needs Other assessment/ plan:   discharge planning   Information/referral to community resources:   [facility] list    PATIENT'S/FAMILY'S RESPONSE TO PLAN OF CARE: Pt alert and oriented x 4. Pt hesitant about rehab at SNF, but agreeable to explore option. Pt expressed feelings that she is unsure how rehab at SNF would benefit her, but after discussion with this CSW and her family she is more open to the option. Pt has strong family support who plan to tour facilities and assist pt in making an informed decision. CSW discussed with pt that she has the right to self-determination regarding decision for disposition plan.   Alison Murray, MSW, [place] Work [phone]

## 2014-03-06 NOTE — Progress Notes (Signed)
Patient ID: Cheryl Nielsen, female   DOB: May 18, 1940, 74 y.o.   MRN: [id number]  [facility] PROGRESS NOTE  RAZIA SCREWS OQH:[id number] DOB: May 29, 1940 DOA: 03/03/2014 PCP: Lujean Amel, MD   Brief narrative:    74 year old Caucasian female with metastatic lung cancer with brain metastases, undergoing chemotherapy and radiation treatment. She was admitted last week with chest pain and transient confusion. Pain was thought to be secondary to cancer. Confusion, resolved and she was discharged home on 03/02/2014. She presented 03/03/2014 with the worsening confusion. She was admitted for further management.  Assessment/Plan:    Acute encephalopathy - Appears to be multifactorial and secondary to progressive brain metastases, CT scan was suggestive of new lesion - MRI brain did not reveal any new metastatic process but it was notable for acute infarcts in the basal ganglia which could also contribute to change in mental status - No further workup required at this time, family and patient agreeable with continuing conservative care - Mental status appears to be stable this morning and at patient's baseline  Small cell lung cancer with brain metastases - Appreciate oncology team and radiation oncologist following - Radiation has been discontinued given the findings of the MRI of the brain as noted new lesions have been seen  - per Dr. Julien Nordmann, continuing dexamethasone, patient is currently on 4 mg IV every 12 - will change dexamethasone to by mouth   Chest pain secondary to cancer - Negative cardiac workup  - Patient denies chest pain or shortness of breath this morning  - Continue to provide analgesia as needed   Elevated blood pressure - Has been elevated since admission with systolic in 546F but blood pressure is currently stable 132/68 this morning  - Hypertension has been contributed to use of Ritalin which has been discontinued  Hyponatremia - Secondary to prerenal etiology, IV  fluids provided and sodium is currently within normal limits 136   Acute renal failure  - Creatinine elevated to 1.2, likely prerenal and secondary to poor oral intake and dehydration  - IV fluids provided and creatinine is within normal limits this morning   Leukocytosis  - Likely steroid related, patient is afebrile  - No need for antibiotics, repeat CBC in the morning   Severe protein calorie malnutrition  - In the context of progressive nature of metastatic small cell lung cancer  - Encourage by mouth intake - Body mass index is 16.55 kg/(m^2)--> Underweight   Anemia of chronic disease of malignancy - Drop in hemoglobin over the past 48 hours is likely dilutional from IV fluids patient has been receiving - No signs of acute blood loss - Repeat CBC in the morning  DVT prophylaxis - SCD's  Code Status: DNR Family Communication:  plan of care discussed with the patient Disposition Plan: Recommending SNF in next 24 hours, pt to discuss with family   IV access:  Peripheral IV  Procedures and diagnostic studies:    Ct Abdomen Pelvis Wo Contrast  02/22/2014   Stable left upper lobe lung masses are noted consistent with malignancy. Stable mediastinal adenopathy is noted. Stable lingular nodule is noted as well as several left lower lobe nodules.  Stable large right hepatic metastatic lesion.  Stable bilateral adrenal masses consistent with metastatic disease.  Stable osseous metastases as described above.     Dg Chest 2 View  03/01/2014  Grossly unchanged left mid lung heterogeneous opacities and volume loss compatible with provided history of lung cancer and undergoing radiation. No definite superimposed  acute cardiopulmonary disease.     Ct Head Wo Contrast  03/01/2014  No acute intracranial abnormality.  Patient has known multiple small brain metastasis which are not well appreciated on this examination. Evidence for osseous metastatic lesions.  Diffuse white matter disease is similar  to the previous examination.     Ct Head W Wo Contrast  03/04/2014   Enhancing 4 mm RIGHT frontal lobe lesion, concerning for new metastasis though, MRI of the brain with contrast would be more sensitive. Re- demonstration of osseous metastasis.  Moderate global parenchymal brain volume loss. Severe white matter changes, similar to prior examination may reflect sequelae of prior radiation.     Ct Chest Wo Contrast  02/22/2014 Stable left upper lobe lung masses are noted consistent with malignancy. Stable mediastinal adenopathy is noted. Stable lingular nodule is noted as well as several left lower lobe nodules.  Stable large right hepatic metastatic lesion.  Stable bilateral adrenal masses consistent with metastatic disease.  Stable osseous metastases as described above.    [name] Contrast  03/04/2014  Punctate nonenhancing focus of restricted diffusion in the inferior left basal ganglia, presumed acute lacunar infarct. No mass effect or hemorrhage. 2. No new or progressive brain metastasis identified. Stable or slight regression of metastatic Brain disease since 02/12/2014 allowing for technical differences between this exam and the prior. 3. No significant change in osseous metastatic disease to the skull.     Ct Angio Chest Aorta W/cm &/or Wo/cm  03/01/2014  No evidence of thoracic aortic dissection. No definitive pulmonary embolism is seen.  Changes consistent with the known history of left upper lobe lung carcinoma with metastatic disease to the mediastinal and retrocrural lymph nodes as well as multiple thoracic and lumbar vertebra, adrenal glands and liver. Multiple small nodules are again noted in the left lower lobe.  No acute abnormality is noted.     Medical Consultants:  Oncology PCT Radiation oncology   Other Consultants:  PT  IAnti-Infectives:   None   Faye Ramsay, MD  [facility] Pager [phone]  If 7PM-7AM, please contact night-coverage [email or web address] Password [email or web address] 03/06/2014,  11:14 AM   LOS: 2 days   HPI/Subjective: No events overnight.   Objective: Filed Vitals:   03/05/14 1245 03/05/14 2102 03/06/14 0521 03/06/14 0740  BP: 152/77 163/77 168/105 132/68  Pulse: 95 97 94 107  Temp: 98.2 F (36.8 C) 98.2 F (36.8 C) 97.7 F (36.5 C)   TempSrc: Oral Oral Oral   Resp: 18 16 16    Height:      Weight:      SpO2: 99% 100% 99%     Intake/Output Summary (Last 24 hours) at 03/06/14 1114 Last data filed at 03/06/14 0730  Gross per 24 hour  Intake    720 ml  Output    675 ml  Net     45 ml    Exam:   General:  Pt is alert, follows commands appropriately, cachectic and frail   Cardiovascular: Regular rhythm, tachycardic, S1/S2, no rubs, no gallops  Respiratory: Clear to auscultation bilaterally, no wheezing, diminished breath sounds at bases   Abdomen: Soft, non tender, non distended, bowel sounds present, no guarding  Extremities:  pulses DP and PT palpable bilaterally  Neuro: Grossly nonfocal  Data Reviewed: Basic Metabolic Panel:  Recent Labs Lab 03/02/14 0127 03/03/14 2211 03/04/14 0446 03/05/14 0423 03/06/14 0505  NA 132* 129* 126* 133* 136  K 3.4* 3.6 3.6 3.9 3.7  CL 98 92* 93* 102  100  CO2 26 25 23 22 26   GLUCOSE 94 132* 134* 138* 106*  BUN 18 24* 23 29* 31*  CREATININE 1.11* 1.00 1.02 1.20* 1.03  CALCIUM 8.7 9.8 8.7 8.8 8.8  MG  --   --  1.6  --   --   PHOS  --   --  4.1  --   --    Liver Function Tests:  Recent Labs Lab 03/02/14 0127 03/03/14 2211 03/04/14 0446  AST 28 30 26   ALT 18 46* 18  ALKPHOS 104 109 88  BILITOT 0.3 0.7 0.5  PROT 6.3 7.3 6.5  ALBUMIN 3.6 4.1 3.6   Recent Labs Lab 03/03/14 2212  AMMONIA 13   CBC:  Recent Labs Lab 03/01/14 1402 03/02/14 0127 03/03/14 2211 03/04/14 0446 03/05/14 0423  WBC 9.0 9.9 6.6 9.2 13.5*  NEUTROABS  --   --  6.1  --   --   HGB 12.3 11.0* 12.8 11.4* 10.3*  HCT 37.0 33.5* 37.2 33.8* 30.4*  MCV 91.4 91.0 88.8 89.2 90.5  PLT 404* 359 391 372 334    Cardiac Enzymes:  Recent Labs Lab 03/01/14 1954 03/02/14 0127 03/02/14 0709  TROPONINI <0.03 <0.03 <0.03    Scheduled Meds: . dexamethasone  4 mg Intravenous Q12H  . mirabegron ER  25 mg Oral Daily  . polyethylene glycol  17 g Oral BID  . senna-docusate  1 tablet Oral BID   Continuous Infusions:

## 2014-03-06 NOTE — Evaluation (Signed)
Occupational Therapy Evaluation Patient Details Name: Cheryl Nielsen MRN: [id number] DOB: 08/15/1940 Today's Date: 03/06/2014    History of Present Illness 74 yo female admitted with weakness, encephalopathy. Hx of lung cancer-undergoing chemo, brain mets.    Clinical Impression   Pt was admitted for the above.  She will benefit from skilled OT in acute and follow up OT at SNF to increase independence with adls.  Pt was independent with ADLs prior to admission.  She needs overall min to occasionally mod A at this time.  We will focusing on toileting and grooming in acute with min guard level goals.      Follow Up Recommendations  SNF    Equipment Recommendations  3 in 1 bedside comode    Recommendations for Other Services       Precautions / Restrictions Precautions Precautions: Fall Restrictions Weight Bearing Restrictions: No      Mobility Bed Mobility           Sit to supine: Min guard   General bed mobility comments: tactile cues, assistance to scoot forward to EOB  Transfers   Equipment used: Rolling walker (2 wheeled) Transfers: Sit to/from Stand Sit to Stand: Min assist;Mod assist (min A from elevated bed; mod A from 3:1 commode)         General transfer comment: assist to rise and steady.  Assist to control descent    Balance     Sitting balance-Leahy Scale: Fair       Standing balance-Leahy Scale: Poor                              ADL Overall ADL's : Needs assistance/impaired     Grooming: Wash/dry hands;Set up;Sitting   Upper Body Bathing: Sitting;Supervision/ safety   Lower Body Bathing: Minimal assistance;Sit to/from stand   Upper Body Dressing : Minimal assistance;Sitting   Lower Body Dressing: Minimal assistance;Sit to/from stand   Toilet Transfer: Minimal assistance;Ambulation;BSC (mod A to stand up from this)   Toileting- Clothing Manipulation and Hygiene: Minimal assistance;Sit to/from stand         General  ADL Comments: pt is able to cross legs for ADLs.  Donned socks.  She ambulated to bathroom, bumping into doorframe, bed, and at times, having one wheel lifted off the floor.  ? vision  Pt fatiques quickly     Vision Additional Comments: vision to be further assessed; pt wears glasses   Perception     Praxis      Pertinent Vitals/Pain Pain Assessment: No/denies pain     Hand Dominance     Extremity/Trunk Assessment Upper Extremity Assessment Upper Extremity Assessment: Generalized weakness           Communication Communication Communication: No difficulties   Cognition Arousal/Alertness: Awake/alert Behavior During Therapy: WFL for tasks assessed/performed   Area of Impairment: Memory;Problem solving;Safety/judgement;Following commands     Memory: Decreased short-term memory Following Commands: Follows one step commands consistently Safety/Judgement: Decreased awareness of safety;Decreased awareness of deficits   Problem Solving: Requires verbal cues;Requires tactile cues     General Comments       Exercises       Shoulder Instructions      Home Living Family/patient expects to be discharged to:: Skilled nursing facility  Additional Comments: pt was living alone prior to admission      Prior Functioning/Environment Level of Independence: Independent             OT Diagnosis: Generalized weakness   OT Problem List: Decreased strength;Decreased activity tolerance;Impaired balance (sitting and/or standing);Impaired vision/perception;Decreased cognition;Decreased safety awareness   OT Treatment/Interventions: Self-care/ADL training;DME and/or AE instruction;Patient/family education;Cognitive remediation/compensation;Visual/perceptual remediation/compensation    OT Goals(Current goals can be found in the care plan section) Acute Rehab OT Goals Patient Stated Goal: family wants pt to be able to get stronger,  get endurance back OT Goal Formulation: With patient Time For Goal Achievement: 03/20/14 Potential to Achieve Goals: Good ADL Goals Pt Will Perform Grooming: with min guard assist;standing Pt Will Transfer to Toilet: with min guard assist;ambulating;bedside commode Pt Will Perform Toileting - Clothing Manipulation and hygiene: with min guard assist;sit to/from stand  OT Frequency: Min 2X/week   Barriers to D/C:            Co-evaluation              End of Session Nurse Communication:  (bed alarm wasn't on; RN likes it set:  called NT)  Activity Tolerance: Patient limited by fatique Patient left: in bed;with call bell/phone within reach;with family/visitor present   Time: 0350-0938 OT Time Calculation (min): 16 min Charges:  OT General Charges $OT Visit: 1 Procedure OT Evaluation $Initial OT Evaluation Tier I: 1 Procedure G-Codes:    Notnamed Scholz 03/17/2014, 2:03 PM  Lesle Chris, OTR/L [phone] March 17, 2014

## 2014-03-06 NOTE — Care Management Note (Signed)
CARE MANAGEMENT NOTE 03/06/2014  Patient:  Cheryl Nielsen, Cheryl Nielsen   Account Number:  [id number]  Date Initiated:  03/06/2014  Documentation initiated by:  Marney Doctor  Subjective/Objective Assessment:   74 yo admitted with Small cell lung CA     Action/Plan:   From home alone   Anticipated DC Date:  03/07/2014   Anticipated [facility] Plan:  [facility]  CM consult      Choice offered to / List presented to:             Status of service:  In process, will continue to follow Medicare Important Message given?   (If response is "NO", the following Medicare IM given date fields will be blank) Date Medicare IM given:   Medicare IM given by:   Date Additional Medicare IM given:   Additional Medicare IM given by:    Discharge Disposition:    Per UR Regulation:  Reviewed for med. necessity/level of care/duration of stay  If discussed at [place] of Stay Meetings, dates discussed:    Comments:  03/06/14 Marney Doctor RN,BSN,NCM [phone] Chart reviewed.  PT is recommending SNF for pt at DC.  No CM needs identified at this time. CM will continue to follow.

## 2014-03-06 NOTE — Progress Notes (Signed)
Clinical Social Work Department CLINICAL SOCIAL WORK PLACEMENT NOTE 03/06/2014  Patient:  Cheryl Nielsen, Cheryl Nielsen  Account Number:  [id number] Admit date:  03/03/2014  Clinical Social Worker:  Maryln Manuel  Date/time:  03/06/2014 11:00 AM  Clinical Social Work is seeking post-discharge placement for this patient at the following level of care:   SKILLED NURSING   (*CSW will update this form in Epic as items are completed)   03/06/2014  Patient/family provided with [place] Department of Clinical Social Work's list of facilities offering this level of care within the geographic area requested by the patient (or if unable, by the patient's family).  03/06/2014  Patient/family informed of their freedom to choose among providers that offer the needed level of care, that participate in Medicare, Medicaid or managed care program needed by the patient, have an available bed and are willing to accept the patient.  03/06/2014  Patient/family informed of [facility]' ownership interest in [facility], as well as of the fact that they are under no obligation to receive care at this facility.  PASARR submitted to EDS on 03/06/2014 PASARR number received on 03/06/2014  FL2 transmitted to all facilities in geographic area requested by pt/family on  03/06/2014 FL2 transmitted to all facilities within larger geographic area on   Patient informed that his/her managed care company has contracts with or will negotiate with  certain facilities, including the following:     Patient/family informed of bed offers received:   Patient chooses bed at  Physician recommends and patient chooses bed at    Patient to be transferred to  on   Patient to be transferred to facility by  Patient and family notified of transfer on  Name of family member notified:    The following physician request were entered in Epic:   Additional Comments:   Alison Murray, MSW, Day Work [phone]

## 2014-03-06 NOTE — Progress Notes (Signed)
Brief support with pt's son around life changes / changes in mother's independence due to illness.    [place], [place]

## 2014-03-06 NOTE — Progress Notes (Signed)
CSW continuing to follow.   CSW followed up with pt, pt daughter, [name], and pt friend at bedside.   CSW discussed bed offers with pt and pt daughter and pt friend.   Pt daughter was pleased about offers from [facility] and [facility] as these facilities were two of the four facilities recommended by pt PCP. CSW discussed that [facility] and [place] and [place] were considering and CSW will update pt and pt family once CSW receives official response.   Pt daughter, [name] plans to go tour facilities in order to send pt pictures of facilities and assist pt in making decision regarding short term rehab. Pt appears more open to short term rehab given the bed offers that pt has received.   CSW to follow up with pt and pt daughter re: decision.   CSW to continue to follow to provide support and assist with pt discharge planning needs.   Alison Murray, MSW, [place] Work [phone]

## 2014-03-06 NOTE — Progress Notes (Signed)
  Radiation Oncology         [phone] ________________________________  Name: Cheryl Nielsen MRN: [id number]  Date: 02/28/2014  DOB: 02-10-1940  End of Treatment Note  Diagnosis:   Recurrent brain metastases, small cell lung cancer     Indication for treatment:  palliative       Radiation treatment dates:   02/25/2014-02/28/2014  Site/dose:  Whole brain / She received an incomplete course of 7.2 Gy in 4 fractions  Beams/energy:   Obliques / 6MV photons  Narrative: The patient developed mental status changes and weakness during radiotherapy.  She was admitted to the hospital and seen by Dr. Isidore Moos and [place]. The decision was made to hold further radiotherapy.  Plan: The patient has completed radiation treatment. The patient will return to radiation oncology clinic for routine followup in one month. I advised them to call or return sooner if they have any questions or concerns related to their recovery or treatment.  -----------------------------------  Eppie Gibson, MD

## 2014-03-06 NOTE — Progress Notes (Signed)
CSW continuing to follow.   CSW followed up with pt at bedside. Pt friend present at this time.  CSW provided supportive listening as pt discussed that pt daughter, [name] sent her pictures while she was out touring facilities and pt states that she is in agreement to [facility] and Rehab upon discharge.   Pt expressed that she is hesitant to go to rehab at SNF, but feels that it is the best plan in order to get stronger for her upcoming cycle of chemotherapy.   CSW contacted [facility] and Rehab and confirmed facility can accept pt tomorrow. Per [place], pt daughter is completing admission paperwork this afternoon with facility.  CSW to continue to follow and facilitate pt discharge needs to [facility] and Rehab when pt medically stable for discharge. Anticipate discharge tomorrow.  Alison Murray, MSW, [place] Work [phone]

## 2014-03-07 ENCOUNTER — Inpatient Hospital Stay (HOSPITAL_COMMUNITY): Payer: Medicare Other

## 2014-03-07 ENCOUNTER — Encounter: Payer: Self-pay | Admitting: Internal Medicine

## 2014-03-07 ENCOUNTER — Ambulatory Visit: Payer: Medicare Other

## 2014-03-07 DIAGNOSIS — R531 Weakness: Secondary | ICD-10-CM

## 2014-03-07 DIAGNOSIS — Z66 Do not resuscitate: Secondary | ICD-10-CM | POA: Insufficient documentation

## 2014-03-07 LAB — BASIC METABOLIC PANEL
ANION GAP: 10 (ref 5–15)
BUN: 26 mg/dL — ABNORMAL HIGH (ref 6–23)
CALCIUM: 9 mg/dL (ref 8.4–10.5)
CHLORIDE: 93 mmol/L — AB (ref 96–112)
CO2: 26 mmol/L (ref 19–32)
Creatinine, Ser: 0.86 mg/dL (ref 0.50–1.10)
GFR calc non Af Amer: 65 mL/min — ABNORMAL LOW (ref >90)
GFR, EST AFRICAN AMERICAN: 75 mL/min — AB (ref >90)
Glucose, Bld: 119 mg/dL — ABNORMAL HIGH (ref 70–99)
Potassium: 3.8 mmol/L (ref 3.5–5.1)
SODIUM: 129 mmol/L — AB (ref 135–145)

## 2014-03-07 LAB — CBC
HEMATOCRIT: 36.3 % (ref 36.0–46.0)
Hemoglobin: 12 g/dL (ref 12.0–15.0)
MCH: 29.9 pg (ref 26.0–34.0)
MCHC: 33.1 g/dL (ref 30.0–36.0)
MCV: 90.5 fL (ref 78.0–100.0)
Platelets: 330 10*3/uL (ref 150–400)
RBC: 4.01 MIL/uL (ref 3.87–5.11)
RDW: 15.2 % (ref 11.5–15.5)
WBC: 12.3 10*3/uL — AB (ref 4.0–10.5)

## 2014-03-07 MED ORDER — SODIUM CHLORIDE 0.9 % IV SOLN
INTRAVENOUS | Status: AC
  Administered 2014-03-07: 13:00:00 via INTRAVENOUS

## 2014-03-07 MED ORDER — AZITHROMYCIN 500 MG PO TABS
500.0000 mg | ORAL_TABLET | Freq: Every day | ORAL | Status: DC
  Administered 2014-03-07 – 2014-03-09 (×3): 500 mg via ORAL
  Filled 2014-03-07 (×3): qty 1

## 2014-03-07 MED ORDER — GUAIFENESIN 100 MG/5ML PO SYRP
200.0000 mg | ORAL_SOLUTION | Freq: Four times a day (QID) | ORAL | Status: DC
  Administered 2014-03-08 – 2014-03-09 (×3): 200 mg via ORAL
  Filled 2014-03-07 (×12): qty 10

## 2014-03-07 NOTE — Progress Notes (Signed)
PT Cancellation Note  Patient Details Name: Cheryl Nielsen MRN: [id number] DOB: Aug 26, 1940   Cancelled Treatment:    Reason Eval/Treat Not Completed: attempted PT tx session-family reports pt had a rough night. They requested pt be allowed to rest. Will check back another day.    Weston Anna, MPT Pager: [phone]

## 2014-03-07 NOTE — Progress Notes (Signed)
Pt's B/P decreased and  resting comfortably . No complains of pain at this time.

## 2014-03-07 NOTE — Progress Notes (Signed)
Patient ID: Cheryl Nielsen, female   DOB: September 18, 1940, 74 y.o.   MRN: [id number]  [facility] PROGRESS NOTE  Cheryl Nielsen XIP:[id number] DOB: 1940/12/24 DOA: 03/03/2014 PCP: Lujean Amel, MD   Brief narrative:    74 year old Caucasian female with metastatic lung cancer with brain metastases, undergoing chemotherapy and radiation treatment. She was admitted last week with chest pain and transient confusion. Pain was thought to be secondary to cancer. Confusion, resolved and she was discharged home on 03/02/2014. She presented 03/03/2014 with the worsening confusion. She was admitted for further management.  Assessment/Plan:    Acute encephalopathy - Appears to be multifactorial and secondary to progressive brain metastases, CT scan was suggestive of new lesion - MRI brain did not reveal any new metastatic process but it was notable for acute infarcts in the basal ganglia which could also contribute to change in mental status - No further workup required at this time, family and patient agreeable with continuing conservative care - Mental status appears to be stable this morning and at patient's baseline  Small cell lung cancer with brain metastases - Appreciate oncology team and radiation oncologist following - Radiation has been discontinued given the findings of the MRI of the brain as noted new lesions have been seen  - per Dr. Julien Nordmann, continuing dexamethasone, patient is currently on 4 mg IV every 12 - changed dexamethasone to by mouth   Cough with phlegm this AM - repeat CXR this AM to r/o underlying PNA  Chest pain secondary to cancer - Negative cardiac workup  - Patient denies chest pain or shortness of breath this morning  - Continue to provide analgesia as needed   Elevated blood pressure - Has been elevated since admission with systolic in 673A but blood pressure is currently stable 128/88 - Hypertension has been contributed to use of Ritalin which has been  discontinued  Hyponatremia - Secondary to prerenal etiology, was better with IVf but pt has not been eating much and Na is down again to 129 - will place back on IVF for 12 hours   Acute renal failure  - Creatinine elevated to 1.2, likely prerenal and secondary to poor oral intake and dehydration  - creatinine is within normal limits this morning   Leukocytosis  - Likely steroid related, patient is afebrile  - WBC trending down, since pt with more cough this AM will ask for CXR to r/o developing PNA   Severe protein calorie malnutrition  - In the context of progressive nature of metastatic small cell lung cancer  - Encourage by mouth intake - Body mass index is 16.55 kg/(m^2)--> Underweight   Anemia of chronic disease of malignancy - Hg is WNL this AM but slightly above pt's baseline and could be from mild dehydration and subsequent hemoconcentration  - No signs of acute blood loss - provide IVF - Repeat CBC in the morning  DVT prophylaxis - SCD's  Code Status: DNR Family Communication: plan of care discussed with the patient Disposition Plan: Recommending SNF, possible d/c in AM if pt feeling better   IV access:  Peripheral IV  Procedures and diagnostic studies:   Ct Abdomen Pelvis Wo Contrast 02/22/2014 Stable left upper lobe lung masses are noted consistent with malignancy. Stable mediastinal adenopathy is noted. Stable lingular nodule is noted as well as several left lower lobe nodules. Stable large right hepatic metastatic lesion. Stable bilateral adrenal masses consistent with metastatic disease. Stable osseous metastases as described above.   Dg Chest 2 View  03/01/2014 Grossly unchanged left mid lung heterogeneous opacities and volume loss compatible with provided history of lung cancer and undergoing radiation. No definite superimposed acute cardiopulmonary disease.   Ct Head Wo Contrast 03/01/2014 No acute intracranial abnormality. Patient has  known multiple small brain metastasis which are not well appreciated on this examination. Evidence for osseous metastatic lesions. Diffuse white matter disease is similar to the previous examination.   Ct Head W Wo Contrast 03/04/2014 Enhancing 4 mm RIGHT frontal lobe lesion, concerning for new metastasis though, MRI of the brain with contrast would be more sensitive. Re- demonstration of osseous metastasis. Moderate global parenchymal brain volume loss. Severe white matter changes, similar to prior examination may reflect sequelae of prior radiation.   Ct Chest Wo Contrast 02/22/2014 Stable left upper lobe lung masses are noted consistent with malignancy. Stable mediastinal adenopathy is noted. Stable lingular nodule is noted as well as several left lower lobe nodules. Stable large right hepatic metastatic lesion. Stable bilateral adrenal masses consistent with metastatic disease. Stable osseous metastases as described above.   [name] Contrast 03/04/2014 Punctate nonenhancing focus of restricted diffusion in the inferior left basal ganglia, presumed acute lacunar infarct. No mass effect or hemorrhage. 2. No new or progressive brain metastasis identified. Stable or slight regression of metastatic Brain disease since 02/12/2014 allowing for technical differences between this exam and the prior. 3. No significant change in osseous metastatic disease to the skull.   Ct Angio Chest Aorta W/cm &/or Wo/cm 03/01/2014 No evidence of thoracic aortic dissection. No definitive pulmonary embolism is seen. Changes consistent with the known history of left upper lobe lung carcinoma with metastatic disease to the mediastinal and retrocrural lymph nodes as well as multiple thoracic and lumbar vertebra, adrenal glands and liver. Multiple small nodules are again noted in the left lower lobe. No acute abnormality is noted.   Medical Consultants:  Oncology PCT Radiation oncology   Other  Consultants:  PT  IAnti-Infectives:   None       Faye Ramsay, MD  [facility] Pager [phone]  If 7PM-7AM, please contact night-coverage [email or web address] Password [email or web address] 03/07/2014, 12:01 PM   LOS: 3 days   HPI/Subjective: No events overnight. Says she is coughing more and has clear phlegm.  Objective: Filed Vitals:   03/06/14 2042 03/06/14 2300 03/07/14 0103 03/07/14 0550  BP: 165/85 183/100 138/76 128/88  Pulse: 114 98  120  Temp: 97.5 F (36.4 C)   98.2 F (36.8 C)  TempSrc: Oral   Oral  Resp: 16   16  Height:      Weight:      SpO2: 100%   98%    Intake/Output Summary (Last 24 hours) at 03/07/14 1201 Last data filed at 03/06/14 1409  Gross per 24 hour  Intake    240 ml  Output      0 ml  Net    240 ml    Exam:   General:  Pt is alert, NAD, frail and weak   Cardiovascular: Regular rate and rhythm, S1/S2, no murmurs, no rubs, no gallops  Respiratory: Clear to auscultation bilaterally, mild rhonchi at bases   Abdomen: Soft, non tender, non distended, bowel sounds present, no guarding  Extremities: pulses DP and PT palpable bilaterally  Neuro: Grossly nonfocal  Data Reviewed: Basic Metabolic Panel:  Recent Labs Lab 03/03/14 2211 03/04/14 0446 03/05/14 0423 03/06/14 0505 03/07/14 0407  NA 129* 126* 133* 136 129*  K 3.6 3.6 3.9 3.7 3.8  CL 92* 93*  102 100 93*  CO2 25 23 22 26 26   GLUCOSE 132* 134* 138* 106* 119*  BUN 24* 23 29* 31* 26*  CREATININE 1.00 1.02 1.20* 1.03 0.86  CALCIUM 9.8 8.7 8.8 8.8 9.0  MG  --  1.6  --   --   --   PHOS  --  4.1  --   --   --    Liver Function Tests:  Recent Labs Lab 03/02/14 0127 03/03/14 2211 03/04/14 0446  AST 28 30 26   ALT 18 46* 18  ALKPHOS 104 109 88  BILITOT 0.3 0.7 0.5  PROT 6.3 7.3 6.5  ALBUMIN 3.6 4.1 3.6    Recent Labs Lab 03/03/14 2212  AMMONIA 13   CBC:  Recent Labs Lab 03/02/14 0127 03/03/14 2211 03/04/14 0446 03/05/14 0423 03/07/14 0407  WBC 9.9 6.6 9.2 13.5* 12.3*   NEUTROABS  --  6.1  --   --   --   HGB 11.0* 12.8 11.4* 10.3* 12.0  HCT 33.5* 37.2 33.8* 30.4* 36.3  MCV 91.0 88.8 89.2 90.5 90.5  PLT 359 391 372 334 330   Cardiac Enzymes:  Recent Labs Lab 03/01/14 1954 03/02/14 0127 03/02/14 0709  TROPONINI <0.03 <0.03 <0.03   Scheduled Meds: . dexamethasone  4 mg Oral Q12H  . guaifenesin  200 mg Oral 4 times per day  . mirabegron ER  25 mg Oral Daily  . polyethylene glycol  17 g Oral BID  . senna-docusate  1 tablet Oral BID   Continuous Infusions:

## 2014-03-07 NOTE — Progress Notes (Signed)
Pt had sever headache and blood pressure was elevated. Md was notified and orders were given.

## 2014-03-07 NOTE — Progress Notes (Signed)
Fmla papers for Cheryl Nielsen the daughter of the patient will be left for Dr. Mckinley Jewel

## 2014-03-07 NOTE — Progress Notes (Signed)
CSW continuing to follow.  Per MD, pt not yet medically ready for discharge.  CSW met with pt daughter, [name] in hallway. Pt daughter shared that pt had a difficult night and has been resting all day. Pt daughter expressed concern that pt has has poor oral intake, but pt has been able to drink some ensure. Support provided.   CSW updated pt daughter that CSW will update [facility]. Pt daughter appreciative.   CSW contacted [facility] and notified the facility that pt not yet medically ready for discharge.  CSW to continue to follow to provide support and assist with pt disposition needs.   Alison Murray, MSW, [place] [facility] [phone]

## 2014-03-07 NOTE — Care Management Note (Signed)
Medicare Important Message given?  YES (If response is "NO", the following Medicare IM given date fields will be blank) Date Medicare IM given:  03/07/2014 Medicare IM given by:  Marney Doctor

## 2014-03-08 ENCOUNTER — Encounter: Payer: Self-pay | Admitting: Internal Medicine

## 2014-03-08 ENCOUNTER — Ambulatory Visit: Payer: Medicare Other

## 2014-03-08 LAB — CBC
HEMATOCRIT: 35.1 % — AB (ref 36.0–46.0)
Hemoglobin: 11.6 g/dL — ABNORMAL LOW (ref 12.0–15.0)
MCH: 30.1 pg (ref 26.0–34.0)
MCHC: 33 g/dL (ref 30.0–36.0)
MCV: 91.2 fL (ref 78.0–100.0)
Platelets: 302 10*3/uL (ref 150–400)
RBC: 3.85 MIL/uL — ABNORMAL LOW (ref 3.87–5.11)
RDW: 15.2 % (ref 11.5–15.5)
WBC: 12.6 10*3/uL — ABNORMAL HIGH (ref 4.0–10.5)

## 2014-03-08 LAB — BASIC METABOLIC PANEL
ANION GAP: 9 (ref 5–15)
BUN: 25 mg/dL — AB (ref 6–23)
CO2: 27 mmol/L (ref 19–32)
Calcium: 8.8 mg/dL (ref 8.4–10.5)
Chloride: 96 mmol/L (ref 96–112)
Creatinine, Ser: 0.98 mg/dL (ref 0.50–1.10)
GFR calc non Af Amer: 55 mL/min — ABNORMAL LOW (ref >90)
GFR, EST AFRICAN AMERICAN: 64 mL/min — AB (ref >90)
Glucose, Bld: 117 mg/dL — ABNORMAL HIGH (ref 70–99)
Potassium: 4.4 mmol/L (ref 3.5–5.1)
Sodium: 132 mmol/L — ABNORMAL LOW (ref 135–145)

## 2014-03-08 MED ORDER — SODIUM CHLORIDE 0.9 % IV SOLN
INTRAVENOUS | Status: AC
  Administered 2014-03-08: 19:00:00 via INTRAVENOUS

## 2014-03-08 NOTE — Progress Notes (Signed)
Recd fmla papers bk for mr mohamed for leigh a ramsey(daughter of patient) from dr. I called and she said her daughter will pick them up from front desk today.

## 2014-03-08 NOTE — Progress Notes (Signed)
PT Cancellation Note  Patient Details Name: Cheryl Nielsen MRN: [id number] DOB: Aug 30, 1940   Cancelled Treatment:    Reason Eval/Treat Not Completed: Fatigue/lethargy limiting ability to participate (pt reports she has been having pain and just now comfortable, visitors also in room, declined mobility at this time)   [place] [facility] 03/08/2014, 2:44 PM Carmelia Bake, PT, DPT 03/08/2014 Pager: [phone]

## 2014-03-08 NOTE — Progress Notes (Signed)
Patient ID: Cheryl Nielsen, female   DOB: 1940-11-04, 74 y.o.   MRN: [id number]  [facility] PROGRESS NOTE  Cheryl Nielsen KDX:[id number] DOB: 09-Oct-1940 DOA: 03/03/2014 PCP: Lujean Amel, MD   Brief narrative:    74 year old Caucasian female with metastatic lung cancer with brain metastases, undergoing chemotherapy and radiation treatment. She was admitted last week with chest pain and transient confusion. Pain was thought to be secondary to cancer. Confusion, resolved and she was discharged home on 03/02/2014. She presented 03/03/2014 with the worsening confusion. She was admitted for further management.  Assessment/Plan:    Acute encephalopathy - Appears to be multifactorial and secondary to progressive brain metastases, CT scan was suggestive of new lesion - MRI brain did not reveal any new metastatic process but it was notable for acute infarcts in the basal ganglia which could also contribute to change in mental status - No further workup required at this time, family and patient agreeable with continuing conservative care - Mental status appears to be stable this morning and at patient's baseline  Small cell lung cancer with brain metastases - Appreciate oncology team and radiation oncologist following - Radiation has been discontinued given the findings of the MRI of the brain as noted new lesions have been seen  - per Dr. Julien Nordmann, continuing dexamethasone, patient is currently on 4 mg IV every 12 - changed dexamethasone to by mouth   Cough with phlegm this AM - CXR with possible PNA, place on empiric Zithromax 2/18, continue day #2  Chest pain secondary to cancer - Negative cardiac workup  - Patient denies chest pain or shortness of breath this morning  - Continue to provide analgesia as needed   Elevated blood pressure - Has been elevated since admission with systolic in 976B but blood pressure is currently stable 128/88 - Hypertension has been contributed to use of  Ritalin which has been discontinued  Hyponatremia - Secondary to prerenal etiology, better with IVF - continue for 12 more hours and repeat BMP in AM  Acute renal failure  - Creatinine elevated to 1.2, likely prerenal and secondary to poor oral intake and dehydration  - creatinine is within normal limits this morning   Leukocytosis  - Likely steroid related, patient is afebrile  - continue Zithromax day #2  Severe protein calorie malnutrition  - In the context of progressive nature of metastatic small cell lung cancer  - Encourage by mouth intake - Body mass index is 16.55 kg/(m^2)--> Underweight   Anemia of chronic disease of malignancy - continue IVF for 12 more hours, repeat CBC in AM  DVT prophylaxis - SCD's  Code Status: Full.  Family Communication:  plan of care discussed with the patient Disposition Plan: SNF in AM  IV access:  Peripheral IV  Procedures and diagnostic studies:     Ct Abdomen Pelvis Wo Contrast 02/22/2014 Stable left upper lobe lung masses are noted consistent with malignancy. Stable mediastinal adenopathy is noted. Stable lingular nodule is noted as well as several left lower lobe nodules. Stable large right hepatic metastatic lesion. Stable bilateral adrenal masses consistent with metastatic disease. Stable osseous metastases as described above.   Dg Chest 2 View 03/01/2014 Grossly unchanged left mid lung heterogeneous opacities and volume loss compatible with provided history of lung cancer and undergoing radiation. No definite superimposed acute cardiopulmonary disease.   Ct Head Wo Contrast 03/01/2014 No acute intracranial abnormality. Patient has known multiple small brain metastasis which are not well appreciated on this examination. Evidence for  osseous metastatic lesions. Diffuse white matter disease is similar to the previous examination.   Ct Head W Wo Contrast 03/04/2014 Enhancing 4 mm RIGHT frontal lobe lesion, concerning  for new metastasis though, MRI of the brain with contrast would be more sensitive. Re- demonstration of osseous metastasis. Moderate global parenchymal brain volume loss. Severe white matter changes, similar to prior examination may reflect sequelae of prior radiation.   Ct Chest Wo Contrast 02/22/2014 Stable left upper lobe lung masses are noted consistent with malignancy. Stable mediastinal adenopathy is noted. Stable lingular nodule is noted as well as several left lower lobe nodules. Stable large right hepatic metastatic lesion. Stable bilateral adrenal masses consistent with metastatic disease. Stable osseous metastases as described above.   [name] Contrast 03/04/2014 Punctate nonenhancing focus of restricted diffusion in the inferior left basal ganglia, presumed acute lacunar infarct. No mass effect or hemorrhage. 2. No new or progressive brain metastasis identified. Stable or slight regression of metastatic Brain disease since 02/12/2014 allowing for technical differences between this exam and the prior. 3. No significant change in osseous metastatic disease to the skull.   Ct Angio Chest Aorta W/cm &/or Wo/cm 03/01/2014 No evidence of thoracic aortic dissection. No definitive pulmonary embolism is seen. Changes consistent with the known history of left upper lobe lung carcinoma with metastatic disease to the mediastinal and retrocrural lymph nodes as well as multiple thoracic and lumbar vertebra, adrenal glands and liver. Multiple small nodules are again noted in the left lower lobe. No acute abnormality is noted.   Medical Consultants:  Oncology PCT Radiation oncology   Other Consultants:  PT  IAnti-Infectives:   Zithromax 2/18 -->  Faye Ramsay, MD  [facility] Pager [phone]  If 7PM-7AM, please contact night-coverage [email or web address] Password [facility] 03/08/2014, 5:32 PM   LOS: 4 days   HPI/Subjective: No events overnight.   Objective: Filed Vitals:   03/07/14  1847 03/07/14 2142 03/08/14 0450 03/08/14 1318  BP: 117/64 119/83 125/95 131/73  Pulse: 118 115 110 115  Temp: 97.9 F (36.6 C) 97.7 F (36.5 C) 98.8 F (37.1 C) 97.5 F (36.4 C)  TempSrc: Oral Oral Oral Oral  Resp: 16 16 16 16   Height:      Weight:      SpO2: 99% 98% 97% 99%    Intake/Output Summary (Last 24 hours) at 03/08/14 1732 Last data filed at 03/08/14 1429  Gross per 24 hour  Intake    480 ml  Output    250 ml  Net    230 ml    Exam:   General:  Pt is alert, follows commands appropriately, not in acute distress  Cardiovascular: Regular rate and rhythm, no rubs, no gallops  Respiratory: no wheezing, no crackles, no rhonchi  Abdomen: Soft, non tender, non distended, bowel sounds present, no guarding  Extremities: pulses DP and PT palpable bilaterally  Neuro: Grossly nonfocal  Data Reviewed: Basic Metabolic Panel:  Recent Labs Lab 03/04/14 0446 03/05/14 0423 03/06/14 0505 03/07/14 0407 03/08/14 0405  NA 126* 133* 136 129* 132*  K 3.6 3.9 3.7 3.8 4.4  CL 93* 102 100 93* 96  CO2 23 22 26 26 27   GLUCOSE 134* 138* 106* 119* 117*  BUN 23 29* 31* 26* 25*  CREATININE 1.02 1.20* 1.03 0.86 0.98  CALCIUM 8.7 8.8 8.8 9.0 8.8  MG 1.6  --   --   --   --   PHOS 4.1  --   --   --   --  Liver Function Tests:  Recent Labs Lab 03/02/14 0127 03/03/14 2211 03/04/14 0446  AST 28 30 26   ALT 18 46* 18  ALKPHOS 104 109 88  BILITOT 0.3 0.7 0.5  PROT 6.3 7.3 6.5  ALBUMIN 3.6 4.1 3.6     Recent Labs Lab 03/03/14 2212  AMMONIA 13   CBC:  Recent Labs Lab 03/03/14 2211 03/04/14 0446 03/05/14 0423 03/07/14 0407 03/08/14 0405  WBC 6.6 9.2 13.5* 12.3* 12.6*  NEUTROABS 6.1  --   --   --   --   HGB 12.8 11.4* 10.3* 12.0 11.6*  HCT 37.2 33.8* 30.4* 36.3 35.1*  MCV 88.8 89.2 90.5 90.5 91.2  PLT 391 372 334 330 302   Cardiac Enzymes:  Recent Labs Lab 03/01/14 1954 03/02/14 0127 03/02/14 0709  TROPONINI <0.03 <0.03 <0.03    Scheduled Meds: .  azithromycin  500 mg Oral Daily  . dexamethasone  4 mg Oral Q12H  . guaifenesin  200 mg Oral 4 times per day  . mirabegron ER  25 mg Oral Daily  . polyethylene glycol  17 g Oral BID  . senna-docusate  1 tablet Oral BID   Continuous Infusions:

## 2014-03-08 NOTE — Progress Notes (Signed)
CSW continuing to follow.  CSW received notification from MD that pt not yet medically ready for discharge today. Anticipate d/c tomorrow.  CSW updated [facility].  CSW met with pt at bedside to notify pt that CSW was aware and updated [employer]. Pt had visitors present, so CSW did not spend a significant amount of time with pt at this time.  Weekend CSW to follow up and assist with pt discharge to [facility].  Alison Murray, MSW, [place] [facility] [phone]

## 2014-03-08 NOTE — Progress Notes (Signed)
OT Cancellation Note  Patient Details Name: Cheryl Nielsen MRN: [id number] DOB: 15-Oct-1940   Cancelled Treatment:    Reason Eval/Treat Not Completed: Other (comment).  Pt limited by pain at this time:  Just had medication.  Will return another time.  Lamya Lausch 03/08/2014, 1:26 PM  Lesle Chris, OTR/L [phone] 03/08/2014

## 2014-03-09 LAB — BASIC METABOLIC PANEL
Anion gap: 7 (ref 5–15)
BUN: 27 mg/dL — ABNORMAL HIGH (ref 6–23)
CO2: 27 mmol/L (ref 19–32)
Calcium: 8.6 mg/dL (ref 8.4–10.5)
Chloride: 95 mmol/L — ABNORMAL LOW (ref 96–112)
Creatinine, Ser: 1.07 mg/dL (ref 0.50–1.10)
GFR calc non Af Amer: 50 mL/min — ABNORMAL LOW (ref >90)
GFR, EST AFRICAN AMERICAN: 58 mL/min — AB (ref >90)
Glucose, Bld: 117 mg/dL — ABNORMAL HIGH (ref 70–99)
Potassium: 4.2 mmol/L (ref 3.5–5.1)
Sodium: 129 mmol/L — ABNORMAL LOW (ref 135–145)

## 2014-03-09 LAB — CBC
HCT: 33.8 % — ABNORMAL LOW (ref 36.0–46.0)
Hemoglobin: 11.1 g/dL — ABNORMAL LOW (ref 12.0–15.0)
MCH: 29.8 pg (ref 26.0–34.0)
MCHC: 32.8 g/dL (ref 30.0–36.0)
MCV: 90.9 fL (ref 78.0–100.0)
PLATELETS: 281 10*3/uL (ref 150–400)
RBC: 3.72 MIL/uL — ABNORMAL LOW (ref 3.87–5.11)
RDW: 15.3 % (ref 11.5–15.5)
WBC: 11.5 10*3/uL — ABNORMAL HIGH (ref 4.0–10.5)

## 2014-03-09 MED ORDER — OXYCODONE-ACETAMINOPHEN 5-325 MG PO TABS: 1.0000 | ORAL_TABLET | Freq: Four times a day (QID) | ORAL | Status: DC | PRN

## 2014-03-09 MED ORDER — DEXAMETHASONE 4 MG PO TABS: 4.0000 mg | ORAL_TABLET | Freq: Two times a day (BID) | ORAL | Status: DC

## 2014-03-09 MED ORDER — AZITHROMYCIN 250 MG PO TABS: 250.0000 mg | ORAL_TABLET | Freq: Every day | ORAL | Status: DC

## 2014-03-09 NOTE — Progress Notes (Signed)
Patient for discharge today to [place].  Patient stable for discharge.  Assessment unchanged from this am.  Patient's son here and will take his mom by car.  Discharge packet given to son and patient discharged via wheelchair to son's car.  [facility]  03/09/2014  12:29 PM

## 2014-03-09 NOTE — Discharge Summary (Signed)
Physician Discharge Summary  JOYOUS GLEGHORN JSH:[id number] DOB: 05-13-1940 DOA: 03/03/2014  PCP: Lujean Amel, MD  Admit date: 03/03/2014 Discharge date: 03/09/2014  Recommendations for Outpatient Follow-up:  1. Pt will need to follow up with PCP in 2-3 weeks post discharge 2. Please obtain BMP to evaluate electrolytes and kidney function 3. Please also check CBC to evaluate Hg and Hct levels 4. Continue Zithromax for 5 more days post discharge  Discharge Diagnoses:  Active Problems:   Small cell lung cancer   Brain metastases   Dehydration   Debility   Weakness   Encephalopathy   Brain metastases   Acute encephalopathy   Hyponatremia   Weakness generalized   DNR (do not resuscitate)   Discharge Condition: Stable  Diet recommendation: Heart healthy diet discussed in details    Brief narrative:    74 year old Caucasian female with metastatic lung cancer with brain metastases, undergoing chemotherapy and radiation treatment. She was admitted last week with chest pain and transient confusion. Pain was thought to be secondary to cancer. Confusion, resolved and she was discharged home on 03/02/2014. She presented 03/03/2014 with the worsening confusion. She was admitted for further management.  Assessment/Plan:    Acute encephalopathy - Appears to be multifactorial and secondary to progressive brain metastases, CT scan was suggestive of new lesion - MRI brain did not reveal any new metastatic process but it was notable for acute infarcts in the basal ganglia which could also contribute to change in mental status - No further workup required at this time, family and patient agreeable with continuing conservative care - Mental status appears to be stable this morning and at patient's baseline  Small cell lung cancer with brain metastases - Appreciate oncology team and radiation oncologist following - Radiation has been discontinued given the findings of the MRI of the brain as  noted new lesions have been seen  - per Dr. Julien Nordmann, continuing dexamethasone, patient is currently on 4 mg IV every 12 - changed dexamethasone to by mouth and pt tolerating well   Cough with phlegm this AM - CXR with possible PNA, place on empiric Zithromax 2/18, continue day #3 today and will need to continue for 5 more days post discharge   Chest pain secondary to cancer - Negative cardiac workup  - Patient denies chest pain or shortness of breath this morning  - Continue to provide analgesia as needed   Elevated blood pressure - Has been elevated since admission with systolic in 850Y but blood pressure is currently stable 128/88 - Hypertension has been contributed to use of Ritalin which has been discontinued  Hyponatremia - Secondary to prerenal etiology, better with IVF  Acute renal failure  - Creatinine elevated to 1.2, likely prerenal and secondary to poor oral intake and dehydration  - creatinine is within normal limits this morning   Leukocytosis  - Likely steroid related, patient is afebrile  - continue Zithromax day #3//8  Severe protein calorie malnutrition  - In the context of progressive nature of metastatic small cell lung cancer  - Encourage by mouth intake - Body mass index is 16.55 kg/(m^2)--> Underweight   Anemia of chronic disease of malignancy - Hg stable over the past 48 hours   DVT prophylaxis - SCD's  Code Status: Full.  Family Communication: plan of care discussed with the patient and daughter at bedside  Disposition Plan: SNF  IV access:  Peripheral IV  Procedures and diagnostic studies:    Ct Abdomen Pelvis Wo Contrast 02/22/2014 Stable  left upper lobe lung masses are noted consistent with malignancy. Stable mediastinal adenopathy is noted. Stable lingular nodule is noted as well as several left lower lobe nodules. Stable large right hepatic metastatic lesion. Stable bilateral adrenal masses consistent with metastatic  disease. Stable osseous metastases as described above.   Dg Chest 2 View 03/01/2014 Grossly unchanged left mid lung heterogeneous opacities and volume loss compatible with provided history of lung cancer and undergoing radiation. No definite superimposed acute cardiopulmonary disease.   Ct Head Wo Contrast 03/01/2014 No acute intracranial abnormality. Patient has known multiple small brain metastasis which are not well appreciated on this examination. Evidence for osseous metastatic lesions. Diffuse white matter disease is similar to the previous examination.   Ct Head W Wo Contrast 03/04/2014 Enhancing 4 mm RIGHT frontal lobe lesion, concerning for new metastasis though, MRI of the brain with contrast would be more sensitive. Re- demonstration of osseous metastasis. Moderate global parenchymal brain volume loss. Severe white matter changes, similar to prior examination may reflect sequelae of prior radiation.   Ct Chest Wo Contrast 02/22/2014 Stable left upper lobe lung masses are noted consistent with malignancy. Stable mediastinal adenopathy is noted. Stable lingular nodule is noted as well as several left lower lobe nodules. Stable large right hepatic metastatic lesion. Stable bilateral adrenal masses consistent with metastatic disease. Stable osseous metastases as described above.   [name] Contrast 03/04/2014 Punctate nonenhancing focus of restricted diffusion in the inferior left basal ganglia, presumed acute lacunar infarct. No mass effect or hemorrhage. 2. No new or progressive brain metastasis identified. Stable or slight regression of metastatic Brain disease since 02/12/2014 allowing for technical differences between this exam and the prior. 3. No significant change in osseous metastatic disease to the skull.   Ct Angio Chest Aorta W/cm &/or Wo/cm 03/01/2014 No evidence of thoracic aortic dissection. No definitive pulmonary embolism is seen. Changes consistent with  the known history of left upper lobe lung carcinoma with metastatic disease to the mediastinal and retrocrural lymph nodes as well as multiple thoracic and lumbar vertebra, adrenal glands and liver. Multiple small nodules are again noted in the left lower lobe. No acute abnormality is noted.   Medical Consultants:  Oncology PCT Radiation oncology   Other Consultants:  PT  IAnti-Infectives:   Zithromax 2/18 --> 5 more days post discharge         Discharge Exam: Filed Vitals:   03/09/14 0558  BP: 158/92  Pulse: 102  Temp: 98.8 F (37.1 C)  Resp: 18   Filed Vitals:   03/08/14 0450 03/08/14 1318 03/08/14 2019 03/09/14 0558  BP: 125/95 131/73 138/89 158/92  Pulse: 110 115 117 102  Temp: 98.8 F (37.1 C) 97.5 F (36.4 C) 97.9 F (36.6 C) 98.8 F (37.1 C)  TempSrc: Oral Oral Oral Oral  Resp: 16 16 16 18   Height:      Weight:      SpO2: 97% 99% 99% 97%    General: Pt is alert, follows commands appropriately, not in acute distress Cardiovascular: Regular rate and rhythm, no rubs, no gallops Respiratory: Clear to auscultation bilaterally, no wheezing, no crackles, no rhonchi Abdominal: Soft, non tender, non distended, bowel sounds +, no guarding Extremities: no edema, no cyanosis, pulses palpable bilaterally DP and PT Neuro: Grossly nonfocal  Discharge Instructions  Discharge Instructions    Diet - low sodium heart healthy    Complete by:  As directed      Increase activity slowly    Complete  by:  As directed             Medication List    STOP taking these medications        temozolomide 250 MG capsule  Commonly known as:  TEMODAR      TAKE these medications        acetaminophen 500 MG tablet  Commonly known as:  TYLENOL  Take 1,000 mg by mouth every 6 (six) hours as needed (For headache.).     azithromycin 250 MG tablet  Commonly known as:  ZITHROMAX  Take 1 tablet (250 mg total) by mouth daily.     dexamethasone 4 MG tablet   Commonly known as:  DECADRON  Take 1 tablet (4 mg total) by mouth 2 (two) times daily.     diphenoxylate-atropine 2.5-0.025 MG per tablet  Commonly known as:  LOMOTIL  Take 1 tablet by mouth 2 (two) times daily as needed for diarrhea or loose stools.     docusate sodium 100 MG capsule  Commonly known as:  COLACE  Take 1 capsule (100 mg total) by mouth 2 (two) times daily. If taking pain medications daily. Otherwise take as needed for constipation     FISH OIL CONCENTRATE PO  Take 1 capsule by mouth every morning. Pt unsure of dose, takes daily     glucosamine-chondroitin 500-400 MG tablet  Take 1 tablet by mouth daily with lunch.     lidocaine-prilocaine cream  Commonly known as:  EMLA  Apply 1 application topically daily as needed (Applies to port-a-cath.).     metoprolol tartrate 25 MG tablet  Commonly known as:  LOPRESSOR  Take 1 tablet (25 mg total) by mouth 2 (two) times daily.     multivitamin with minerals Tabs tablet  Take 1 tablet by mouth daily with lunch. She takes Dance movement psychotherapist Adult 50+.     MYRBETRIQ 25 MG Tb24 tablet  Generic drug:  mirabegron ER  Take 25 mg by mouth daily.     ondansetron 8 MG tablet  Commonly known as:  ZOFRAN  Take 1 tablet (8 mg total) by mouth every 8 (eight) hours as needed for nausea or vomiting.     oxyCODONE-acetaminophen 5-325 MG per tablet  Commonly known as:  PERCOCET/ROXICET  Take 1-2 tablets by mouth every 6 (six) hours as needed for severe pain.     polyethylene glycol packet  Commonly known as:  MIRALAX / GLYCOLAX  Take 17 g by mouth daily as needed.     prochlorperazine 10 MG tablet  Commonly known as:  COMPAZINE  Take 1 tablet (10 mg total) by mouth every 6 (six) hours as needed for nausea or vomiting.     senna 8.6 MG Tabs tablet  Commonly known as:  SENOKOT  Take 1 tablet by mouth daily as needed for mild constipation.           Follow-up Information    Follow up with Lujean Amel, MD.   Specialty:   Family Medicine   Contact information:   [place] [address] [phone]        The results of significant diagnostics from this hospitalization (including imaging, microbiology, ancillary and laboratory) are listed below for reference.     Microbiology: No results found for this or any previous visit (from the past 240 hour(s)).   Labs: Basic Metabolic Panel:  Recent Labs Lab 03/04/14 0446 03/05/14 0423 03/06/14 0505 03/07/14 0407 03/08/14 0405 03/09/14 0415  NA 126*  133* 136 129* 132* 129*  K 3.6 3.9 3.7 3.8 4.4 4.2  CL 93* 102 100 93* 96 95*  CO2 23 22 26 26 27 27   GLUCOSE 134* 138* 106* 119* 117* 117*  BUN 23 29* 31* 26* 25* 27*  CREATININE 1.02 1.20* 1.03 0.86 0.98 1.07  CALCIUM 8.7 8.8 8.8 9.0 8.8 8.6  MG 1.6  --   --   --   --   --   PHOS 4.1  --   --   --   --   --    Liver Function Tests:  Recent Labs Lab 03/03/14 2211 03/04/14 0446  AST 30 26  ALT 46* 18  ALKPHOS 109 88  BILITOT 0.7 0.5  PROT 7.3 6.5  ALBUMIN 4.1 3.6    Recent Labs Lab 03/03/14 2212  AMMONIA 13   CBC:  Recent Labs Lab 03/03/14 2211 03/04/14 0446 03/05/14 0423 03/07/14 0407 03/08/14 0405 03/09/14 0415  WBC 6.6 9.2 13.5* 12.3* 12.6* 11.5*  NEUTROABS 6.1  --   --   --   --   --   HGB 12.8 11.4* 10.3* 12.0 11.6* 11.1*  HCT 37.2 33.8* 30.4* 36.3 35.1* 33.8*  MCV 88.8 89.2 90.5 90.5 91.2 90.9  PLT 391 372 334 330 302 281    BNP (last 3 results)  Recent Labs  03/01/14 1402  BNP 84.7    SIGNED: Time coordinating discharge: Over 30 minutes  Faye Ramsay, MD  [facility] 03/09/2014, 11:06 AM Pager [phone]  If 7PM-7AM, please contact night-coverage [email or web address] Password [email or web address]

## 2014-03-09 NOTE — Discharge Instructions (Signed)

## 2014-03-09 NOTE — Clinical Social Work Placement (Signed)
Clinical Social Work Department CLINICAL SOCIAL WORK PLACEMENT NOTE 03/09/2014  Patient:  Cheryl Nielsen, Cheryl Nielsen  Account Number:  [id number] Admit date:  03/03/2014  Clinical Social Worker:  Maryln Manuel  Date/time:  03/06/2014 11:00 AM  Clinical Social Work is seeking post-discharge placement for this patient at the following level of care:   SKILLED NURSING   (*CSW will update this form in Epic as items are completed)   03/06/2014  Patient/family provided with [facility] Department of Clinical Social Work's list of facilities offering this level of care within the geographic area requested by the patient (or if unable, by the patient's family).  03/06/2014  Patient/family informed of their freedom to choose among providers that offer the needed level of care, that participate in Medicare, Medicaid or managed care program needed by the patient, have an available bed and are willing to accept the patient.  03/06/2014  Patient/family informed of [facility]' ownership interest in [facility], as well as of the fact that they are under no obligation to receive care at this facility.  PASARR submitted to EDS on 03/06/2014 PASARR number received on 03/06/2014  FL2 transmitted to all facilities in geographic area requested by pt/family on  03/06/2014 FL2 transmitted to all facilities within larger geographic area on   Patient informed that his/her managed care company has contracts with or will negotiate with  certain facilities, including the following:     Patient/family informed of bed offers received:  03/08/2014 Patient chooses bed at [facility] Physician recommends and patient chooses bed at    Patient to be transferred to [place] on  03/09/2014 Patient to be transferred to facility by son and daughter Patient and family notified of transfer on 03/09/2014 Name of family member notified:  Henry/son  The following  physician request were entered in Epic:   Additional Comments:  .Dede Query, [place] Worker - Weekend Coverage cell #: [phone]

## 2014-03-11 ENCOUNTER — Ambulatory Visit: Payer: Medicare Other

## 2014-03-12 ENCOUNTER — Encounter: Payer: Self-pay | Admitting: Internal Medicine

## 2014-03-12 ENCOUNTER — Telehealth: Payer: Self-pay | Admitting: Medical Oncology

## 2014-03-12 ENCOUNTER — Ambulatory Visit: Payer: Medicare Other

## 2014-03-12 ENCOUNTER — Non-Acute Institutional Stay (SKILLED_NURSING_FACILITY): Payer: Medicare Other | Admitting: Internal Medicine

## 2014-03-12 DIAGNOSIS — E43 Unspecified severe protein-calorie malnutrition: Secondary | ICD-10-CM | POA: Insufficient documentation

## 2014-03-12 DIAGNOSIS — E871 Hypo-osmolality and hyponatremia: Secondary | ICD-10-CM | POA: Diagnosis not present

## 2014-03-12 DIAGNOSIS — C7931 Secondary malignant neoplasm of brain: Secondary | ICD-10-CM

## 2014-03-12 DIAGNOSIS — N179 Acute kidney failure, unspecified: Secondary | ICD-10-CM | POA: Insufficient documentation

## 2014-03-12 DIAGNOSIS — R030 Elevated blood-pressure reading, without diagnosis of hypertension: Secondary | ICD-10-CM | POA: Diagnosis not present

## 2014-03-12 DIAGNOSIS — C3491 Malignant neoplasm of unspecified part of right bronchus or lung: Secondary | ICD-10-CM | POA: Diagnosis not present

## 2014-03-12 DIAGNOSIS — R059 Cough, unspecified: Secondary | ICD-10-CM

## 2014-03-12 DIAGNOSIS — R05 Cough: Secondary | ICD-10-CM | POA: Diagnosis not present

## 2014-03-12 DIAGNOSIS — IMO0001 Reserved for inherently not codable concepts without codable children: Secondary | ICD-10-CM

## 2014-03-12 DIAGNOSIS — G9340 Encephalopathy, unspecified: Secondary | ICD-10-CM

## 2014-03-12 DIAGNOSIS — D630 Anemia in neoplastic disease: Secondary | ICD-10-CM | POA: Diagnosis not present

## 2014-03-12 NOTE — Assessment & Plan Note (Signed)
In the context of progressive nature of metastatic small cell lung cancer  - Encourage by mouth intake - Body mass index is 16.55 kg/(m^2)--> Underweight

## 2014-03-12 NOTE — Assessment & Plan Note (Signed)
Appears to be multifactorial and secondary to progressive brain metastases, CT scan was suggestive of new lesion - MRI brain did not reveal any new metastatic process but it was notable for acute infarcts in the basal ganglia which could also contribute to change in mental status - No further workup required at this time, family and patient agreeable with continuing conservative care

## 2014-03-12 NOTE — Assessment & Plan Note (Signed)
Creatinine elevated to 1.2, likely prerenal and secondary to poor oral intake and dehydration  - creatinine is within normal limits this morning

## 2014-03-12 NOTE — Assessment & Plan Note (Signed)
Appreciate oncology team and radiation oncologist following - Radiation has been discontinued given the findings of the MRI of the brain as noted new lesions have been seen  - per Dr. Julien Nordmann, continuing dexamethasone, patient is currently on 4 mg IV every 12 - changed dexamethasone to by mouth

## 2014-03-12 NOTE — Progress Notes (Signed)
MRN: [id number] Name: Cheryl Nielsen  Sex: female Age: 74 y.o. DOB: 1940/02/22  [employer] #:  Facility/Room: Level Of Care: SNF Provider: Inocencio Homes D Emergency Contacts: Extended Emergency Contact Information Primary Emergency Contact: Ulysees Barns, [place] [address] [place] of [place] Phone: [phone] Relation: Daughter Secondary Emergency Contact: Daryll Drown [place] of [place] Mobile Phone: [phone] Relation: Son  Code Status:   Allergies: Asa; Codeine; and Tramadol  Chief Complaint  Patient presents with  . New Admit To SNF    HPI: Patient is 74 y.o. female who  Past Medical History  Diagnosis Date  . Pneumonia, organism unspecified   . Status post chemotherapy  10/11/2012     carboplatin  . S/P radiation therapy 08/24/2012-09/11/2012    Whole Brain  / 32.5 Gy in 13 fractions  . 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 S/P radiation therapy  07/24/2012-08/10/2012      Mediastinum and Left Hilum / 35 Gy in 14 fractions      Past Surgical History  Procedure Laterality Date  . Nasal sinus surgery    . Video bronchoscopy Bilateral 07/05/2012    Procedure: VIDEO BRONCHOSCOPY WITHOUT FLUORO;  Surgeon: Tanda Rockers, MD;  Location: [facility];  Service: Cardiopulmonary;  Laterality: Bilateral;  . Portacath placement        Medication List       This list is accurate as of: 03/12/14  9:36 PM.  Always use your most recent med list.               acetaminophen 500 MG tablet  Commonly known as:  TYLENOL  Take 1,000 mg by mouth every 6 (six) hours as needed (For headache.).     azithromycin 250 MG tablet  Commonly known as:  ZITHROMAX  Take 1 tablet (250 mg total) by mouth daily.     dexamethasone 4 MG tablet  Commonly known as:  DECADRON  Take 1 tablet (4 mg total) by mouth 2 (two) times daily.     diphenoxylate-atropine 2.5-0.025 MG per tablet  Commonly known as:  LOMOTIL  Take 1 tablet by mouth  2 (two) times daily as needed for diarrhea or loose stools.     docusate sodium 100 MG capsule  Commonly known as:  COLACE  Take 1 capsule (100 mg total) by mouth 2 (two) times daily. If taking pain medications daily. Otherwise take as needed for constipation     FISH OIL CONCENTRATE PO  Take 1 capsule by mouth every morning. Pt unsure of dose, takes daily     glucosamine-chondroitin 500-400 MG tablet  Take 1 tablet by mouth daily with lunch.     lidocaine-prilocaine cream  Commonly known as:  EMLA  Apply 1 application topically daily as needed (Applies to port-a-cath.).     metoprolol tartrate 25 MG tablet  Commonly known as:  LOPRESSOR  Take 1 tablet (25 mg total) by mouth 2 (two) times daily.     multivitamin with minerals Tabs tablet  Take 1 tablet by mouth daily with lunch. She takes Dance movement psychotherapist Adult 50+.     MYRBETRIQ 25 MG Tb24 tablet  Generic drug:  mirabegron ER  Take 25 mg by mouth daily.     ondansetron 8 MG tablet  Commonly known as:  ZOFRAN  Take 1 tablet (8 mg total) by mouth every 8 (eight) hours as needed for nausea or vomiting.  oxyCODONE-acetaminophen 5-325 MG per tablet  Commonly known as:  PERCOCET/ROXICET  Take 1-2 tablets by mouth every 6 (six) hours as needed for severe pain.     polyethylene glycol packet  Commonly known as:  MIRALAX / GLYCOLAX  Take 17 g by mouth daily as needed.     prochlorperazine 10 MG tablet  Commonly known as:  COMPAZINE  Take 1 tablet (10 mg total) by mouth every 6 (six) hours as needed for nausea or vomiting.     senna 8.6 MG Tabs tablet  Commonly known as:  SENOKOT  Take 1 tablet by mouth daily as needed for mild constipation.        No orders of the defined types were placed in this encounter.    There is no immunization history for the selected administration types on file for this patient.  History  Substance Use Topics  . Smoking status: Former Smoker -- 1.00 packs/day for 40 years    Types:  Cigarettes    Quit date: 04/18/2012  . Smokeless tobacco: Never Used  . Alcohol Use: No    Family history is noncontributory    Review of Systems  DATA OBTAINED: from patient, nurse, medical record, family member GENERAL:  no fevers, fatigue, appetite changes SKIN: No itching, rash or wounds EYES: No eye pain, redness, discharge EARS: No earache, tinnitus, change in hearing NOSE: No congestion, drainage or bleeding  MOUTH/THROAT: No mouth or tooth pain, No sore throat RESPIRATORY: No cough, wheezing, SOB CARDIAC: No chest pain, palpitations, lower extremity edema  GI: No abdominal pain, No N/V/D or constipation, No heartburn or reflux  GU: No dysuria, frequency or urgency, or incontinence  MUSCULOSKELETAL: No unrelieved bone/joint pain NEUROLOGIC: No headache, dizziness or focal weakness PSYCHIATRIC: No overt anxiety or sadness, No behavior issue.   Filed Vitals:   03/12/14 2128  BP: 113/81  Pulse: 95  Temp: 98 F (36.7 C)  Resp: 18    Physical Exam  GENERAL APPEARANCE: Alert, conversant,  No acute distress.  SKIN: No diaphoresis rash HEAD: Normocephalic, atraumatic  EYES: Conjunctiva/lids clear. Pupils round, reactive. EOMs intact.  EARS: External exam WNL, canals clear. Hearing grossly normal.  NOSE: No deformity or discharge.  MOUTH/THROAT: Lips w/o lesions  RESPIRATORY: Breathing is even, unlabored. Lung sounds are clear   CARDIOVASCULAR: Heart RRR no murmurs, rubs or gallops. No peripheral edema.   GASTROINTESTINAL: Abdomen is soft, non-tender, not distended w/ normal bowel sounds. GENITOURINARY: Bladder non tender, not distended  MUSCULOSKELETAL: No abnormal joints or musculature NEUROLOGIC:  Cranial nerves 2-12 grossly intact. Moves all extremities  PSYCHIATRIC: Mood and affect appropriate to situation, no behavioral issues  Patient Active Problem List   Diagnosis Date Noted  . Weakness generalized 03/07/2014  . DNR (do not resuscitate)   . Acute  encephalopathy   . Hyponatremia   . Debility 03/04/2014  . Weakness 03/04/2014  . Encephalopathy 03/04/2014  . Brain metastases   . Pain in the chest   . Chest pain 03/01/2014  . Transient confusion 03/01/2014  . Dehydration 03/01/2014  . Palliative care encounter 02/25/2014  . DNR (do not resuscitate) discussion 02/25/2014  . Fatigue 02/25/2014  . Neoplastic malignant related fatigue   . Drug induced neutropenia(288.03) 03/23/2013  . Brain metastases 07/19/2012  . Small cell lung cancer 07/10/2012  . Pleural effusion 06/27/2012  . Lung mass 06/27/2012    CBC    Component Value Date/Time   WBC 11.5* 03/09/2014 0415   WBC 11.7* 02/20/2014 1120  RBC 3.72* 03/09/2014 0415   RBC 3.71 02/20/2014 1120   HGB 11.1* 03/09/2014 0415   HGB 11.1* 02/20/2014 1120   HCT 33.8* 03/09/2014 0415   HCT 34.5* 02/20/2014 1120   PLT 281 03/09/2014 0415   PLT 152 02/20/2014 1120   MCV 90.9 03/09/2014 0415   MCV 92.8 02/20/2014 1120   LYMPHSABS 0.4* 03/03/2014 2211   LYMPHSABS 0.7* 02/20/2014 1120   MONOABS 0.2 03/03/2014 2211   MONOABS 1.2* 02/20/2014 1120   EOSABS 0.0 03/03/2014 2211   EOSABS 0.0 02/20/2014 1120   BASOSABS 0.0 03/03/2014 2211   BASOSABS 0.0 02/20/2014 1120    CMP     Component Value Date/Time   NA 129* 03/09/2014 0415   NA 137 02/20/2014 1120   K 4.2 03/09/2014 0415   K 4.1 02/20/2014 1120   CL 95* 03/09/2014 0415   CL 93* 07/10/2012 1544   CO2 27 03/09/2014 0415   CO2 27 02/20/2014 1120   GLUCOSE 117* 03/09/2014 0415   GLUCOSE 79 02/20/2014 1120   GLUCOSE 134* 07/10/2012 1544   BUN 27* 03/09/2014 0415   BUN 17.1 02/20/2014 1120   CREATININE 1.07 03/09/2014 0415   CREATININE 1.2* 02/20/2014 1120   CALCIUM 8.6 03/09/2014 0415   CALCIUM 9.2 02/20/2014 1120   PROT 6.5 03/04/2014 0446   PROT 6.9 02/20/2014 1120   ALBUMIN 3.6 03/04/2014 0446   ALBUMIN 3.7 02/20/2014 1120   AST 26 03/04/2014 0446   AST 21 02/20/2014 1120   ALT 18 03/04/2014 0446   ALT 23  02/20/2014 1120   ALKPHOS 88 03/04/2014 0446   ALKPHOS 152* 02/20/2014 1120   BILITOT 0.5 03/04/2014 0446   BILITOT <0.20 02/20/2014 1120   GFRNONAA 50* 03/09/2014 0415   GFRAA 58* 03/09/2014 0415    Assessment and Plan  No problem-specific assessment & plan notes found for this encounter.   Hennie Duos, MD

## 2014-03-12 NOTE — Assessment & Plan Note (Signed)
Secondary to prerenal etiology, better with IVF

## 2014-03-12 NOTE — Assessment & Plan Note (Signed)
CXR with possible PNA, place on empiric Zithromax 2/18, continue day #3 today and will need to continue for 5 more days post discharge

## 2014-03-12 NOTE — Assessment & Plan Note (Signed)
-   Hg stable over the past 48 hours

## 2014-03-12 NOTE — Telephone Encounter (Signed)
Called [name] back . What is pts threshold for being ready to restart chemo.?  She is in rehab for 2 days and they expect a 4-6 week rehab stay. "She is not 100%coherent ". [name] and family are aware of  pts decision prestroke to continue with chemo 2/29 -Now that she is not completely coherent is chemo still an "viable option" -and is it a realistic goal ? She would like Mohamed's input. Note to [place].

## 2014-03-12 NOTE — Assessment & Plan Note (Signed)
Stage 4 with brain mets; Appreciate oncology team and radiation oncologist following - Radiation has been discontinued given the findings of the MRI of the brain as noted new lesions have been seen  - per Dr. Julien Nordmann, continuing dexamethasone, patient is currently on 4 mg IV every 12

## 2014-03-12 NOTE — Assessment & Plan Note (Signed)
-   Has been elevated since admission with systolic in 117B but blood pressure is currently stable 128/88 - Hypertension has been contributed to use of Ritalin which has been discontinued

## 2014-03-12 NOTE — Progress Notes (Addendum)
MRN: [id number] Name: Cheryl Nielsen  Sex: female Age: 74 y.o. DOB: Jul 09, 1940  [place] #: [facility] Facility/Room: 111 Level Of Care: SNF Provider: Inocencio Homes D Emergency Contacts: Extended Emergency Contact Information Primary Emergency Contact: Ulysees Barns, [place] [address] Johnnette Litter of [place] Phone: [phone] Relation: Daughter Secondary Emergency Contact: Daryll Drown [place] of [place] Mobile Phone: [phone] Relation: Son  Code Status: DNR  Allergies: Asa; Codeine; and Tramadol  Chief Complaint  Patient presents with  . New Admit To SNF    HPI: Patient is 74 y.o. 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  Past Medical History  Diagnosis Date  . Pneumonia, organism unspecified   . Status post chemotherapy  10/11/2012     carboplatin  . S/P radiation therapy 08/24/2012-09/11/2012    Whole Brain  / 32.5 Gy in 13 fractions  . 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 S/P radiation therapy  07/24/2012-08/10/2012      Mediastinum and Left Hilum / 35 Gy in 14 fractions      Past Surgical History  Procedure Laterality Date  . Nasal sinus surgery    . Video bronchoscopy Bilateral 07/05/2012    Procedure: VIDEO BRONCHOSCOPY WITHOUT FLUORO;  Surgeon: Tanda Rockers, MD;  Location: [facility];  Service: Cardiopulmonary;  Laterality: Bilateral;  . Portacath placement        Medication List       This list is accurate as of: 03/12/14 10:02 PM.  Always use your most recent med list.               acetaminophen 500 MG tablet  Commonly known as:  TYLENOL  Take 1,000 mg by mouth every 6 (six) hours as needed (For headache.).     azithromycin 250 MG tablet  Commonly known as:  ZITHROMAX  Take 1 tablet (250 mg total) by mouth daily.     dexamethasone 4 MG tablet  Commonly known as:   DECADRON  Take 1 tablet (4 mg total) by mouth 2 (two) times daily.     diphenoxylate-atropine 2.5-0.025 MG per tablet  Commonly known as:  LOMOTIL  Take 1 tablet by mouth 2 (two) times daily as needed for diarrhea or loose stools.     docusate sodium 100 MG capsule  Commonly known as:  COLACE  Take 1 capsule (100 mg total) by mouth 2 (two) times daily. If taking pain medications daily. Otherwise take as needed for constipation     FISH OIL CONCENTRATE PO  Take 1 capsule by mouth every morning. Pt unsure of dose, takes daily     glucosamine-chondroitin 500-400 MG tablet  Take 1 tablet by mouth daily with lunch.     lidocaine-prilocaine cream  Commonly known as:  EMLA  Apply 1 application topically daily as needed (Applies to port-a-cath.).     metoprolol tartrate 25 MG tablet  Commonly known as:  LOPRESSOR  Take 1 tablet (25 mg total) by mouth 2 (two) times daily.     multivitamin with minerals Tabs tablet  Take 1 tablet by mouth daily with lunch. She takes Dance movement psychotherapist Adult 50+.     MYRBETRIQ 25 MG Tb24 tablet  Generic drug:  mirabegron ER  Take 25 mg by mouth daily.  ondansetron 8 MG tablet  Commonly known as:  ZOFRAN  Take 1 tablet (8 mg total) by mouth every 8 (eight) hours as needed for nausea or vomiting.     oxyCODONE-acetaminophen 5-325 MG per tablet  Commonly known as:  PERCOCET/ROXICET  Take 1-2 tablets by mouth every 6 (six) hours as needed for severe pain.     polyethylene glycol packet  Commonly known as:  MIRALAX / GLYCOLAX  Take 17 g by mouth daily as needed.     prochlorperazine 10 MG tablet  Commonly known as:  COMPAZINE  Take 1 tablet (10 mg total) by mouth every 6 (six) hours as needed for nausea or vomiting.     senna 8.6 MG Tabs tablet  Commonly known as:  SENOKOT  Take 1 tablet by mouth daily as needed for mild constipation.        No orders of the defined types were placed in this encounter.    There is no immunization history for  the selected administration types on file for this patient.  History  Substance Use Topics  . Smoking status: Former Smoker -- 1.00 packs/day for 40 years    Types: Cigarettes    Quit date: 04/18/2012  . Smokeless tobacco: Never Used  . Alcohol Use: No    Family history is noncontributory    Review of Systems  DATA OBTAINED: from patient, nurse, family member GENERAL:  no fevers,+ fatigue, SKIN: No itching, rash or wounds EYES: No eye pain, redness, discharge EARS: No earache, tinnitus, change in hearing NOSE: No congestion, drainage or bleeding  MOUTH/THROAT: No mouth or tooth pain, No sore throat RESPIRATORY: No cough, wheezing, SOB CARDIAC: No chest pain, palpitations, lower extremity edema  GI: No abdominal pain, No N/V/D or constipation, No heartburn or reflux  GU: No dysuria, frequency or urgency, or incontinence  MUSCULOSKELETAL: No unrelieved bone/joint pain NEUROLOGIC: No headache, dizziness;+ confusion PSYCHIATRIC: No overt anxiety or sadness, No behavior issue.   Filed Vitals:   03/12/14 2128  BP: 113/81  Pulse: 95  Temp: 98 F (36.7 C)  Resp: 18    Physical Exam  GENERAL APPEARANCE: Alert, min conversant,  No acute distress.  SKIN: No diaphoresis rash; bald head HEAD: Normocephalic, atraumatic  EYES: Conjunctiva/lids clear. Pupils round, reactive. EOMs intact.  EARS: External exam WNL, canals clear. Hearing grossly normal.  NOSE: No deformity or discharge.  MOUTH/THROAT: Lips w/o lesions  RESPIRATORY: Breathing is even, unlabored. Lung sounds are clear   CARDIOVASCULAR: Heart RRR no murmurs, rubs or gallops. No peripheral edema.   GASTROINTESTINAL: Abdomen is soft, non-tender, not distended w/ normal bowel sounds. GENITOURINARY: Bladder non tender, not distended  MUSCULOSKELETAL: No abnormal joints or musculature NEUROLOGIC:  Cranial nerves 2-12 grossly intact. Moves all extremities  PSYCHIATRIC: Quiet,vague appearing no behavioral issues  Patient  Active Problem List   Diagnosis Date Noted  . Cough 03/12/2014  . Elevated BP 03/12/2014  . ARF (acute renal failure) 03/12/2014  . Protein-calorie malnutrition, severe 03/12/2014  . Anemia in neoplastic disease 03/12/2014  . Weakness generalized 03/07/2014  . DNR (do not resuscitate)   . Acute encephalopathy   . Hyponatremia   . Debility 03/04/2014  . Weakness 03/04/2014  . Encephalopathy 03/04/2014  . Brain metastases   . Pain in the chest   . Chest pain 03/01/2014  . Transient confusion 03/01/2014  . Dehydration 03/01/2014  . Palliative care encounter 02/25/2014  . DNR (do not resuscitate) discussion 02/25/2014  . Fatigue 02/25/2014  . Neoplastic  malignant related fatigue   . Drug induced neutropenia(288.03) 03/23/2013  . Brain metastases 07/19/2012  . Small cell lung cancer 07/10/2012  . Pleural effusion 06/27/2012  . Lung mass 06/27/2012    CBC    Component Value Date/Time   WBC 11.5* 03/09/2014 0415   WBC 11.7* 02/20/2014 1120   RBC 3.72* 03/09/2014 0415   RBC 3.71 02/20/2014 1120   HGB 11.1* 03/09/2014 0415   HGB 11.1* 02/20/2014 1120   HCT 33.8* 03/09/2014 0415   HCT 34.5* 02/20/2014 1120   PLT 281 03/09/2014 0415   PLT 152 02/20/2014 1120   MCV 90.9 03/09/2014 0415   MCV 92.8 02/20/2014 1120   LYMPHSABS 0.4* 03/03/2014 2211   LYMPHSABS 0.7* 02/20/2014 1120   MONOABS 0.2 03/03/2014 2211   MONOABS 1.2* 02/20/2014 1120   EOSABS 0.0 03/03/2014 2211   EOSABS 0.0 02/20/2014 1120   BASOSABS 0.0 03/03/2014 2211   BASOSABS 0.0 02/20/2014 1120    CMP     Component Value Date/Time   NA 129* 03/09/2014 0415   NA 137 02/20/2014 1120   K 4.2 03/09/2014 0415   K 4.1 02/20/2014 1120   CL 95* 03/09/2014 0415   CL 93* 07/10/2012 1544   CO2 27 03/09/2014 0415   CO2 27 02/20/2014 1120   GLUCOSE 117* 03/09/2014 0415   GLUCOSE 79 02/20/2014 1120   GLUCOSE 134* 07/10/2012 1544   BUN 27* 03/09/2014 0415   BUN 17.1 02/20/2014 1120   CREATININE 1.07 03/09/2014  0415   CREATININE 1.2* 02/20/2014 1120   CALCIUM 8.6 03/09/2014 0415   CALCIUM 9.2 02/20/2014 1120   PROT 6.5 03/04/2014 0446   PROT 6.9 02/20/2014 1120   ALBUMIN 3.6 03/04/2014 0446   ALBUMIN 3.7 02/20/2014 1120   AST 26 03/04/2014 0446   AST 21 02/20/2014 1120   ALT 18 03/04/2014 0446   ALT 23 02/20/2014 1120   ALKPHOS 88 03/04/2014 0446   ALKPHOS 152* 02/20/2014 1120   BILITOT 0.5 03/04/2014 0446   BILITOT <0.20 02/20/2014 1120   GFRNONAA 50* 03/09/2014 0415   GFRAA 58* 03/09/2014 0415    Assessment and Plan  Acute encephalopathy Appears to be multifactorial and secondary to progressive brain metastases, CT scan was suggestive of new lesion - MRI brain did not reveal any new metastatic process but it was notable for acute infarcts in the basal ganglia which could also contribute to change in mental status - No further workup required at this time, family and patient agreeable with continuing conservative care   Brain metastases Appreciate oncology team and radiation oncologist following - Radiation has been discontinued given the findings of the MRI of the brain as noted new lesions have been seen  - per Dr. 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 Julien Nordmann, continuing dexamethasone, patient is currently on 4 mg IV every 12   Cough CXR with possible PNA, place on empiric Zithromax 2/18, continue day #3 today and will need to continue for 5 more days post discharge     Elevated BP - Has been elevated since admission with systolic in 350K but blood pressure is currently stable 128/88 - Hypertension has been contributed to use of  Ritalin which has been  discontinued   ARF (acute renal failure) Creatinine elevated to 1.2, likely prerenal and secondary to poor oral intake and dehydration  - creatinine is within normal limits this morning    Protein-calorie malnutrition, severe In the context of progressive nature of metastatic small cell lung cancer  - Encourage by mouth intake - Body mass index is 16.55 kg/(m^2)--> Underweight    Anemia in neoplastic disease - Hg stable over the past 48 hours      Hennie Duos, MD

## 2014-03-13 ENCOUNTER — Ambulatory Visit: Payer: Medicare Other

## 2014-03-13 ENCOUNTER — Other Ambulatory Visit: Payer: Self-pay | Admitting: Medical Oncology

## 2014-03-13 ENCOUNTER — Telehealth: Payer: Self-pay | Admitting: Medical Oncology

## 2014-03-13 NOTE — Telephone Encounter (Signed)
I told [name] for pt to keep appointment on Monday .

## 2014-03-14 ENCOUNTER — Ambulatory Visit: Payer: Medicare Other

## 2014-03-15 ENCOUNTER — Ambulatory Visit: Payer: Medicare Other

## 2014-03-18 ENCOUNTER — Other Ambulatory Visit: Payer: Medicare Other

## 2014-03-18 ENCOUNTER — Encounter (HOSPITAL_COMMUNITY): Payer: Self-pay | Admitting: Emergency Medicine

## 2014-03-18 ENCOUNTER — Encounter (HOSPITAL_COMMUNITY)
Admission: EM | Disposition: A | Discharge status: Skilled Nursing Facility | Payer: Self-pay | Source: Home / Self Care | Attending: Internal Medicine

## 2014-03-18 ENCOUNTER — Ambulatory Visit: Payer: Medicare Other

## 2014-03-18 ENCOUNTER — Inpatient Hospital Stay (HOSPITAL_COMMUNITY): Payer: Medicare Other

## 2014-03-18 ENCOUNTER — Inpatient Hospital Stay (HOSPITAL_COMMUNITY)
Admission: EM | Admit: 2014-03-18 | Discharge: 2014-03-22 | DRG: 469 | Disposition: A | Discharge status: Skilled Nursing Facility | Payer: Medicare Other | Attending: Internal Medicine | Admitting: Internal Medicine

## 2014-03-18 ENCOUNTER — Emergency Department (HOSPITAL_COMMUNITY): Payer: Medicare Other

## 2014-03-18 ENCOUNTER — Telehealth: Payer: Self-pay | Admitting: Medical Oncology

## 2014-03-18 ENCOUNTER — Inpatient Hospital Stay (HOSPITAL_COMMUNITY): Payer: Medicare Other | Admitting: Anesthesiology

## 2014-03-18 ENCOUNTER — Ambulatory Visit: Payer: Medicare Other | Admitting: Physician Assistant

## 2014-03-18 DIAGNOSIS — I639 Cerebral infarction, unspecified: Secondary | ICD-10-CM | POA: Diagnosis present

## 2014-03-18 DIAGNOSIS — I959 Hypotension, unspecified: Secondary | ICD-10-CM | POA: Diagnosis not present

## 2014-03-18 DIAGNOSIS — W19XXXA Unspecified fall, initial encounter: Secondary | ICD-10-CM | POA: Insufficient documentation

## 2014-03-18 DIAGNOSIS — I10 Essential (primary) hypertension: Secondary | ICD-10-CM | POA: Diagnosis present

## 2014-03-18 DIAGNOSIS — Z801 Family history of malignant neoplasm of trachea, bronchus and lung: Secondary | ICD-10-CM | POA: Diagnosis not present

## 2014-03-18 DIAGNOSIS — I6381 Other cerebral infarction due to occlusion or stenosis of small artery: Secondary | ICD-10-CM | POA: Diagnosis present

## 2014-03-18 DIAGNOSIS — S72009A Fracture of unspecified part of neck of unspecified femur, initial encounter for closed fracture: Secondary | ICD-10-CM | POA: Diagnosis present

## 2014-03-18 DIAGNOSIS — Z8673 Personal history of transient ischemic attack (TIA), and cerebral infarction without residual deficits: Secondary | ICD-10-CM

## 2014-03-18 DIAGNOSIS — R000 Tachycardia, unspecified: Secondary | ICD-10-CM | POA: Diagnosis not present

## 2014-03-18 DIAGNOSIS — E871 Hypo-osmolality and hyponatremia: Secondary | ICD-10-CM | POA: Diagnosis present

## 2014-03-18 DIAGNOSIS — E43 Unspecified severe protein-calorie malnutrition: Secondary | ICD-10-CM | POA: Diagnosis present

## 2014-03-18 DIAGNOSIS — Z8781 Personal history of (healed) traumatic fracture: Secondary | ICD-10-CM

## 2014-03-18 DIAGNOSIS — Z87891 Personal history of nicotine dependence: Secondary | ICD-10-CM

## 2014-03-18 DIAGNOSIS — S72002A Fracture of unspecified part of neck of left femur, initial encounter for closed fracture: Principal | ICD-10-CM | POA: Diagnosis present

## 2014-03-18 DIAGNOSIS — Z923 Personal history of irradiation: Secondary | ICD-10-CM | POA: Diagnosis not present

## 2014-03-18 DIAGNOSIS — C7949 Secondary malignant neoplasm of other parts of nervous system: Secondary | ICD-10-CM

## 2014-03-18 DIAGNOSIS — D630 Anemia in neoplastic disease: Secondary | ICD-10-CM | POA: Diagnosis present

## 2014-03-18 DIAGNOSIS — C3402 Malignant neoplasm of left main bronchus: Secondary | ICD-10-CM | POA: Diagnosis present

## 2014-03-18 DIAGNOSIS — D62 Acute posthemorrhagic anemia: Secondary | ICD-10-CM | POA: Diagnosis not present

## 2014-03-18 DIAGNOSIS — W1830XA Fall on same level, unspecified, initial encounter: Secondary | ICD-10-CM | POA: Diagnosis present

## 2014-03-18 DIAGNOSIS — Z66 Do not resuscitate: Secondary | ICD-10-CM | POA: Diagnosis present

## 2014-03-18 DIAGNOSIS — C7931 Secondary malignant neoplasm of brain: Secondary | ICD-10-CM | POA: Diagnosis present

## 2014-03-18 DIAGNOSIS — C3490 Malignant neoplasm of unspecified part of unspecified bronchus or lung: Secondary | ICD-10-CM | POA: Diagnosis present

## 2014-03-18 DIAGNOSIS — C3491 Malignant neoplasm of unspecified part of right bronchus or lung: Secondary | ICD-10-CM

## 2014-03-18 DIAGNOSIS — Z9221 Personal history of antineoplastic chemotherapy: Secondary | ICD-10-CM | POA: Diagnosis not present

## 2014-03-18 DIAGNOSIS — D6481 Anemia due to antineoplastic chemotherapy: Secondary | ICD-10-CM | POA: Diagnosis present

## 2014-03-18 HISTORY — PX: HIP ARTHROPLASTY: SHX981

## 2014-03-18 HISTORY — DX: Essential (primary) hypertension: I10

## 2014-03-18 LAB — URINALYSIS, ROUTINE W REFLEX MICROSCOPIC
Bilirubin Urine: NEGATIVE
Glucose, UA: NEGATIVE mg/dL
Hgb urine dipstick: NEGATIVE
Ketones, ur: NEGATIVE mg/dL
Leukocytes, UA: NEGATIVE
NITRITE: NEGATIVE
PH: 7 (ref 5.0–8.0)
Protein, ur: NEGATIVE mg/dL
SPECIFIC GRAVITY, URINE: 1.016 (ref 1.005–1.030)
Urobilinogen, UA: 0.2 mg/dL (ref 0.0–1.0)

## 2014-03-18 LAB — BASIC METABOLIC PANEL
Anion gap: 9 (ref 5–15)
BUN: 22 mg/dL (ref 6–23)
CALCIUM: 9 mg/dL (ref 8.4–10.5)
CO2: 27 mmol/L (ref 19–32)
CREATININE: 1.13 mg/dL — AB (ref 0.50–1.10)
Chloride: 94 mmol/L — ABNORMAL LOW (ref 96–112)
GFR calc Af Amer: 54 mL/min — ABNORMAL LOW (ref >90)
GFR calc non Af Amer: 47 mL/min — ABNORMAL LOW (ref >90)
GLUCOSE: 118 mg/dL — AB (ref 70–99)
Potassium: 4.2 mmol/L (ref 3.5–5.1)
Sodium: 130 mmol/L — ABNORMAL LOW (ref 135–145)

## 2014-03-18 LAB — CREATININE, SERUM
CREATININE: 1.19 mg/dL — AB (ref 0.50–1.10)
GFR calc non Af Amer: 44 mL/min — ABNORMAL LOW (ref >90)
GFR, EST AFRICAN AMERICAN: 51 mL/min — AB (ref >90)

## 2014-03-18 LAB — CBC
HCT: 24.8 % — ABNORMAL LOW (ref 36.0–46.0)
HEMOGLOBIN: 8.4 g/dL — AB (ref 12.0–15.0)
MCH: 30.9 pg (ref 26.0–34.0)
MCHC: 33.9 g/dL (ref 30.0–36.0)
MCV: 91.2 fL (ref 78.0–100.0)
Platelets: 180 10*3/uL (ref 150–400)
RBC: 2.72 MIL/uL — AB (ref 3.87–5.11)
RDW: 15 % (ref 11.5–15.5)
WBC: 31.4 10*3/uL — ABNORMAL HIGH (ref 4.0–10.5)

## 2014-03-18 LAB — CBC WITH DIFFERENTIAL/PLATELET
BASOS ABS: 0 10*3/uL (ref 0.0–0.1)
BASOS PCT: 0 % (ref 0–1)
EOS ABS: 0.1 10*3/uL (ref 0.0–0.7)
EOS PCT: 1 % (ref 0–5)
HCT: 37.2 % (ref 36.0–46.0)
Hemoglobin: 12.4 g/dL (ref 12.0–15.0)
Lymphocytes Relative: 8 % — ABNORMAL LOW (ref 12–46)
Lymphs Abs: 1 10*3/uL (ref 0.7–4.0)
MCH: 30.5 pg (ref 26.0–34.0)
MCHC: 33.3 g/dL (ref 30.0–36.0)
MCV: 91.4 fL (ref 78.0–100.0)
MONO ABS: 0.5 10*3/uL (ref 0.1–1.0)
MONOS PCT: 4 % (ref 3–12)
NEUTROS ABS: 11.3 10*3/uL — AB (ref 1.7–7.7)
Neutrophils Relative %: 87 % — ABNORMAL HIGH (ref 43–77)
Platelets: 265 10*3/uL (ref 150–400)
RBC: 4.07 MIL/uL (ref 3.87–5.11)
RDW: 14.8 % (ref 11.5–15.5)
WBC: 12.9 10*3/uL — ABNORMAL HIGH (ref 4.0–10.5)

## 2014-03-18 LAB — TYPE AND SCREEN
ABO/RH(D): O POS
Antibody Screen: NEGATIVE

## 2014-03-18 LAB — PROTIME-INR
INR: 0.97 (ref 0.00–1.49)
PROTHROMBIN TIME: 13 s (ref 11.6–15.2)

## 2014-03-18 LAB — SURGICAL PCR SCREEN
MRSA, PCR: NEGATIVE
STAPHYLOCOCCUS AUREUS: NEGATIVE

## 2014-03-18 LAB — ABO/RH: ABO/RH(D): O POS

## 2014-03-18 SURGERY — HEMIARTHROPLASTY, HIP, DIRECT ANTERIOR APPROACH, FOR FRACTURE
Anesthesia: General | Site: Hip | Laterality: Left

## 2014-03-18 MED ORDER — ONDANSETRON HCL 4 MG PO TABS: 8.0000 mg | ORAL_TABLET | Freq: Three times a day (TID) | ORAL | Status: DC | PRN

## 2014-03-18 MED ORDER — ONDANSETRON HCL 4 MG/2ML IJ SOLN
INTRAMUSCULAR | Status: AC
  Filled 2014-03-18: qty 2

## 2014-03-18 MED ORDER — METOPROLOL TARTRATE 25 MG PO TABS
25.0000 mg | ORAL_TABLET | Freq: Two times a day (BID) | ORAL | Status: DC
  Administered 2014-03-18: 25 mg via ORAL
  Filled 2014-03-18: qty 1

## 2014-03-18 MED ORDER — CEFAZOLIN SODIUM-DEXTROSE 2-3 GM-% IV SOLR
INTRAVENOUS | Status: AC
  Filled 2014-03-18: qty 100

## 2014-03-18 MED ORDER — ONDANSETRON HCL 4 MG PO TABS: 4.0000 mg | ORAL_TABLET | Freq: Four times a day (QID) | ORAL | Status: DC | PRN

## 2014-03-18 MED ORDER — METOCLOPRAMIDE HCL 5 MG/ML IJ SOLN: 5.0000 mg | Freq: Three times a day (TID) | INTRAMUSCULAR | Status: DC | PRN

## 2014-03-18 MED ORDER — PROPOFOL 10 MG/ML IV BOLUS
INTRAVENOUS | Status: DC | PRN
  Administered 2014-03-18 (×6): 20 mg via INTRAVENOUS

## 2014-03-18 MED ORDER — ACETAMINOPHEN 650 MG RE SUPP: 650.0000 mg | Freq: Four times a day (QID) | RECTAL | Status: DC | PRN

## 2014-03-18 MED ORDER — ONDANSETRON HCL 4 MG/2ML IJ SOLN: 4.0000 mg | Freq: Four times a day (QID) | INTRAMUSCULAR | Status: DC | PRN

## 2014-03-18 MED ORDER — LACTATED RINGERS IV SOLN
INTRAVENOUS | Status: DC | PRN
  Administered 2014-03-18: 16:00:00 via INTRAVENOUS

## 2014-03-18 MED ORDER — MORPHINE SULFATE 2 MG/ML IJ SOLN
2.0000 mg | INTRAMUSCULAR | Status: DC | PRN
  Administered 2014-03-18 – 2014-03-21 (×4): 2 mg via INTRAVENOUS
  Filled 2014-03-18 (×4): qty 1

## 2014-03-18 MED ORDER — HYDROCODONE-ACETAMINOPHEN 5-325 MG PO TABS
1.0000 | ORAL_TABLET | Freq: Four times a day (QID) | ORAL | Status: DC | PRN
  Administered 2014-03-20 – 2014-03-22 (×2): 2 via ORAL
  Filled 2014-03-18 (×2): qty 2

## 2014-03-18 MED ORDER — SODIUM CHLORIDE 0.9 % IR SOLN
Status: DC | PRN
  Administered 2014-03-18: 1000 mL

## 2014-03-18 MED ORDER — SODIUM CHLORIDE 0.9 % IV SOLN
INTRAVENOUS | Status: DC
  Administered 2014-03-18: via INTRAVENOUS
  Administered 2014-03-18 (×2): 500 mL via INTRAVENOUS
  Administered 2014-03-19 – 2014-03-20 (×2): via INTRAVENOUS

## 2014-03-18 MED ORDER — ONDANSETRON HCL 4 MG/2ML IJ SOLN
4.0000 mg | Freq: Once | INTRAMUSCULAR | Status: AC
  Administered 2014-03-18: 4 mg via INTRAVENOUS
  Filled 2014-03-18: qty 2

## 2014-03-18 MED ORDER — PHENYLEPHRINE 40 MCG/ML (10ML) SYRINGE FOR IV PUSH (FOR BLOOD PRESSURE SUPPORT)
PREFILLED_SYRINGE | INTRAVENOUS | Status: AC
  Filled 2014-03-18: qty 20

## 2014-03-18 MED ORDER — CEFAZOLIN SODIUM-DEXTROSE 2-3 GM-% IV SOLR
2.0000 g | Freq: Four times a day (QID) | INTRAVENOUS | Status: AC
  Administered 2014-03-18 – 2014-03-19 (×2): 2 g via INTRAVENOUS
  Filled 2014-03-18 (×2): qty 50

## 2014-03-18 MED ORDER — PROMETHAZINE HCL 25 MG/ML IJ SOLN
6.2500 mg | INTRAMUSCULAR | Status: DC | PRN
Start: 2014-03-18 — End: 2014-03-18

## 2014-03-18 MED ORDER — PROPOFOL 10 MG/ML IV BOLUS
INTRAVENOUS | Status: AC
  Filled 2014-03-18: qty 20

## 2014-03-18 MED ORDER — ONDANSETRON HCL 4 MG/2ML IJ SOLN
4.0000 mg | Freq: Once | INTRAMUSCULAR | Status: AC
  Administered 2014-03-18: 4 mg via INTRAVENOUS

## 2014-03-18 MED ORDER — GLYCOPYRROLATE 0.2 MG/ML IJ SOLN
INTRAMUSCULAR | Status: AC
  Filled 2014-03-18: qty 3

## 2014-03-18 MED ORDER — FENTANYL CITRATE 0.05 MG/ML IJ SOLN
INTRAMUSCULAR | Status: AC
  Filled 2014-03-18: qty 5

## 2014-03-18 MED ORDER — SODIUM CHLORIDE 0.9 % IV BOLUS (SEPSIS)
1000.0000 mL | Freq: Once | INTRAVENOUS | Status: AC
  Administered 2014-03-18: 1000 mL via INTRAVENOUS

## 2014-03-18 MED ORDER — MIDAZOLAM HCL 2 MG/2ML IJ SOLN
INTRAMUSCULAR | Status: AC
  Filled 2014-03-18: qty 2

## 2014-03-18 MED ORDER — PHENYLEPHRINE HCL 10 MG/ML IJ SOLN
10.0000 mg | INTRAMUSCULAR | Status: DC | PRN
  Administered 2014-03-18: 50 ug/min via INTRAVENOUS

## 2014-03-18 MED ORDER — ENOXAPARIN SODIUM 40 MG/0.4ML ~~LOC~~ SOLN
40.0000 mg | SUBCUTANEOUS | Status: DC
  Administered 2014-03-19 – 2014-03-22 (×4): 40 mg via SUBCUTANEOUS
  Filled 2014-03-18 (×4): qty 0.4

## 2014-03-18 MED ORDER — MORPHINE SULFATE 4 MG/ML IJ SOLN
4.0000 mg | Freq: Once | INTRAMUSCULAR | Status: AC
  Administered 2014-03-18: 4 mg via INTRAVENOUS
  Filled 2014-03-18: qty 1

## 2014-03-18 MED ORDER — MIDAZOLAM HCL 2 MG/2ML IJ SOLN
INTRAMUSCULAR | Status: DC | PRN
  Administered 2014-03-18: 0.5 mg via INTRAVENOUS

## 2014-03-18 MED ORDER — LACTATED RINGERS IV SOLN
INTRAVENOUS | Status: DC
  Administered 2014-03-18: 16:00:00 via INTRAVENOUS

## 2014-03-18 MED ORDER — KETOROLAC TROMETHAMINE 30 MG/ML IJ SOLN
INTRAMUSCULAR | Status: AC
  Filled 2014-03-18: qty 1

## 2014-03-18 MED ORDER — PROCHLORPERAZINE MALEATE 10 MG PO TABS
10.0000 mg | ORAL_TABLET | Freq: Four times a day (QID) | ORAL | Status: DC | PRN
  Filled 2014-03-18: qty 1

## 2014-03-18 MED ORDER — FENTANYL CITRATE 0.05 MG/ML IJ SOLN
INTRAMUSCULAR | Status: DC | PRN
  Administered 2014-03-18 (×2): 25 ug via INTRAVENOUS

## 2014-03-18 MED ORDER — ACETAMINOPHEN 325 MG PO TABS: 650.0000 mg | ORAL_TABLET | Freq: Four times a day (QID) | ORAL | Status: DC | PRN

## 2014-03-18 MED ORDER — DOCUSATE SODIUM 100 MG PO CAPS
100.0000 mg | ORAL_CAPSULE | Freq: Two times a day (BID) | ORAL | Status: DC
  Administered 2014-03-19 – 2014-03-22 (×6): 100 mg via ORAL
  Filled 2014-03-18 (×7): qty 1

## 2014-03-18 MED ORDER — CEFAZOLIN SODIUM-DEXTROSE 2-3 GM-% IV SOLR
INTRAVENOUS | Status: DC | PRN
  Administered 2014-03-18: 2 g via INTRAVENOUS

## 2014-03-18 MED ORDER — DEXTROSE 5 % IV SOLN: 500.0000 mg | Freq: Four times a day (QID) | INTRAVENOUS | Status: DC | PRN

## 2014-03-18 MED ORDER — OXYCODONE-ACETAMINOPHEN 5-325 MG PO TABS: 1.0000 | ORAL_TABLET | Freq: Four times a day (QID) | ORAL | Status: DC | PRN

## 2014-03-18 MED ORDER — METOCLOPRAMIDE HCL 10 MG PO TABS: 5.0000 mg | ORAL_TABLET | Freq: Three times a day (TID) | ORAL | Status: DC | PRN

## 2014-03-18 MED ORDER — FENTANYL CITRATE 0.05 MG/ML IJ SOLN: 25.0000 ug | INTRAMUSCULAR | Status: DC | PRN

## 2014-03-18 MED ORDER — ONDANSETRON HCL 4 MG/2ML IJ SOLN
INTRAMUSCULAR | Status: DC | PRN
  Administered 2014-03-18: 4 mg via INTRAVENOUS

## 2014-03-18 MED ORDER — PHENOL 1.4 % MT LIQD: 1.0000 | OROMUCOSAL | Status: DC | PRN

## 2014-03-18 MED ORDER — NEOSTIGMINE METHYLSULFATE 10 MG/10ML IV SOLN
INTRAVENOUS | Status: AC
  Filled 2014-03-18: qty 1

## 2014-03-18 MED ORDER — HEPARIN SODIUM (PORCINE) 5000 UNIT/ML IJ SOLN: 5000.0000 [IU] | Freq: Three times a day (TID) | INTRAMUSCULAR | Status: DC

## 2014-03-18 MED ORDER — FENTANYL CITRATE 0.05 MG/ML IJ SOLN
50.0000 ug | INTRAMUSCULAR | Status: DC | PRN
  Administered 2014-03-18: 50 ug via INTRAVENOUS
  Filled 2014-03-18: qty 2

## 2014-03-18 MED ORDER — ALBUMIN HUMAN 5 % IV SOLN
INTRAVENOUS | Status: AC
  Administered 2014-03-18: 12.5 g
  Filled 2014-03-18: qty 250

## 2014-03-18 MED ORDER — DEXAMETHASONE 4 MG PO TABS
4.0000 mg | ORAL_TABLET | Freq: Two times a day (BID) | ORAL | Status: DC
  Administered 2014-03-18 – 2014-03-19 (×2): 4 mg via ORAL
  Filled 2014-03-18 (×3): qty 1

## 2014-03-18 MED ORDER — METHOCARBAMOL 500 MG PO TABS
500.0000 mg | ORAL_TABLET | Freq: Four times a day (QID) | ORAL | Status: DC | PRN
  Administered 2014-03-18 – 2014-03-21 (×2): 500 mg via ORAL
  Filled 2014-03-18 (×2): qty 1

## 2014-03-18 MED ORDER — OXYCODONE-ACETAMINOPHEN 5-325 MG PO TABS
1.0000 | ORAL_TABLET | ORAL | Status: DC | PRN
  Administered 2014-03-18: 1 via ORAL
  Filled 2014-03-18: qty 1
  Filled 2014-03-18: qty 2

## 2014-03-18 MED ORDER — MENTHOL 3 MG MT LOZG: 1.0000 | LOZENGE | OROMUCOSAL | Status: DC | PRN

## 2014-03-18 MED ORDER — MEPERIDINE HCL 25 MG/ML IJ SOLN: 6.2500 mg | INTRAMUSCULAR | Status: DC | PRN

## 2014-03-18 MED ORDER — POLYETHYLENE GLYCOL 3350 17 G PO PACK
17.0000 g | PACK | Freq: Every day | ORAL | Status: DC | PRN
  Filled 2014-03-18: qty 1

## 2014-03-18 SURGICAL SUPPLY — 67 items
BLADE SAW SAG 73X25 THK (BLADE) ×2
BLADE SAW SGTL 73X25 THK (BLADE) ×1 IMPLANT
BLADE SURG ROTATE 9660 (MISCELLANEOUS) IMPLANT
BRUSH FEMORAL CANAL (MISCELLANEOUS) IMPLANT
CAPT HIP HEMI 1 ×3 IMPLANT
CHLORAPREP W/TINT 26ML (MISCELLANEOUS) IMPLANT
COVER SURGICAL LIGHT HANDLE (MISCELLANEOUS) ×3 IMPLANT
DRAPE IMP U-DRAPE 54X76 (DRAPES) ×3 IMPLANT
DRAPE INCISE IOBAN 66X45 STRL (DRAPES) IMPLANT
DRAPE ORTHO SPLIT 77X108 STRL (DRAPES) ×4
DRAPE PROXIMA HALF (DRAPES) ×3 IMPLANT
DRAPE SURG ORHT 6 SPLT 77X108 (DRAPES) ×2 IMPLANT
DRAPE U-SHAPE 47X51 STRL (DRAPES) ×3 IMPLANT
DRILL BIT 7/64X5 (BIT) ×3 IMPLANT
DRSG ADAPTIC 3X8 NADH LF (GAUZE/BANDAGES/DRESSINGS) IMPLANT
DRSG AQUACEL AG ADV 3.5X10 (GAUZE/BANDAGES/DRESSINGS) ×3 IMPLANT
DRSG PAD ABDOMINAL 8X10 ST (GAUZE/BANDAGES/DRESSINGS) IMPLANT
ELECT BLADE 6.5 EXT (BLADE) IMPLANT
ELECT REM PT RETURN 9FT ADLT (ELECTROSURGICAL) ×3
ELECTRODE REM PT RTRN 9FT ADLT (ELECTROSURGICAL) ×1 IMPLANT
EVACUATOR 1/8 PVC DRAIN (DRAIN) IMPLANT
GAUZE SPONGE 4X4 12PLY STRL (GAUZE/BANDAGES/DRESSINGS) IMPLANT
GLOVE BIO SURGEON STRL SZ7 (GLOVE) ×3 IMPLANT
GLOVE BIO SURGEON STRL SZ7.5 (GLOVE) ×3 IMPLANT
GLOVE BIOGEL M STER SZ 6 (GLOVE) ×3 IMPLANT
GLOVE BIOGEL PI IND STRL 6.5 (GLOVE) ×1 IMPLANT
GLOVE BIOGEL PI IND STRL 7.0 (GLOVE) ×2 IMPLANT
GLOVE BIOGEL PI IND STRL 7.5 (GLOVE) ×1 IMPLANT
GLOVE BIOGEL PI IND STRL 8 (GLOVE) ×1 IMPLANT
GLOVE BIOGEL PI INDICATOR 6.5 (GLOVE) ×2
GLOVE BIOGEL PI INDICATOR 7.0 (GLOVE) ×4
GLOVE BIOGEL PI INDICATOR 7.5 (GLOVE) ×2
GLOVE BIOGEL PI INDICATOR 8 (GLOVE) ×2
GLOVE SS BIOGEL STRL SZ 8 (GLOVE) ×1 IMPLANT
GLOVE SUPERSENSE BIOGEL SZ 8 (GLOVE) ×2
GOWN STRL REUS W/ TWL LRG LVL3 (GOWN DISPOSABLE) ×3 IMPLANT
GOWN STRL REUS W/TWL LRG LVL3 (GOWN DISPOSABLE) ×6
HANDPIECE INTERPULSE COAX TIP (DISPOSABLE)
HOOD PEEL AWAY FACE SHEILD DIS (HOOD) ×6 IMPLANT
IMMOBILIZER KNEE 20 (SOFTGOODS) IMPLANT
IMMOBILIZER KNEE 22 UNIV (SOFTGOODS) IMPLANT
IMMOBILIZER KNEE 24 THIGH 36 (MISCELLANEOUS) IMPLANT
IMMOBILIZER KNEE 24 UNIV (MISCELLANEOUS)
KIT BASIN OR (CUSTOM PROCEDURE TRAY) ×3 IMPLANT
KIT ROOM TURNOVER OR (KITS) ×3 IMPLANT
MANIFOLD NEPTUNE II (INSTRUMENTS) ×3 IMPLANT
NEEDLE MAYO TROCAR (NEEDLE) ×3 IMPLANT
NS IRRIG 1000ML POUR BTL (IV SOLUTION) ×3 IMPLANT
PACK TOTAL JOINT (CUSTOM PROCEDURE TRAY) ×3 IMPLANT
PACK UNIVERSAL I (CUSTOM PROCEDURE TRAY) ×3 IMPLANT
PAD ARMBOARD 7.5X6 YLW CONV (MISCELLANEOUS) ×6 IMPLANT
PASSER SUT SWANSON 36MM LOOP (INSTRUMENTS) IMPLANT
PRESSURIZER FEMORAL UNIV (MISCELLANEOUS) IMPLANT
SET HNDPC FAN SPRY TIP SCT (DISPOSABLE) IMPLANT
STAPLER VISISTAT 35W (STAPLE) ×3 IMPLANT
SUCTION FRAZIER TIP 10 FR DISP (SUCTIONS) ×3 IMPLANT
SUT ETHIBOND NAB CT1 #1 30IN (SUTURE) ×9 IMPLANT
SUT VIC AB 0 CT1 27 (SUTURE) ×2
SUT VIC AB 0 CT1 27XBRD ANBCTR (SUTURE) ×1 IMPLANT
SUT VIC AB 1 CTB1 27 (SUTURE) ×6 IMPLANT
SUT VIC AB 2-0 CT1 27 (SUTURE) ×2
SUT VIC AB 2-0 CT1 TAPERPNT 27 (SUTURE) ×1 IMPLANT
TOWEL OR 17X24 6PK STRL BLUE (TOWEL DISPOSABLE) ×3 IMPLANT
TOWEL OR 17X26 10 PK STRL BLUE (TOWEL DISPOSABLE) ×3 IMPLANT
TOWER CARTRIDGE SMART MIX (DISPOSABLE) IMPLANT
TRAY FOLEY CATH 16FRSI W/METER (SET/KITS/TRAYS/PACK) IMPLANT
WATER STERILE IRR 1000ML POUR (IV SOLUTION) ×12 IMPLANT

## 2014-03-18 NOTE — ED Notes (Addendum)
Patel, MD at bedside.  

## 2014-03-18 NOTE — Progress Notes (Signed)
Dr. Linna Caprice at bedside. 250 cc 5% albumin ordered to attempt to wean Neo gtt to off.

## 2014-03-18 NOTE — ED Notes (Addendum)
Awake. Verbally responsive. A/O x4. Resp even and unlabored. No audible adventitious breath sounds noted. ABC's intact. SR on monitor. F/C patent and intact draining yellow urine without difficulty. Family at bedside.

## 2014-03-18 NOTE — ED Notes (Addendum)
Resting quietly with eye closed. Easily arousable. Verbally responsive. Resp even and unlabored. ABC's intact. SR on monitor. IV saline lock patent and intact. NAD. Family at bedside.

## 2014-03-18 NOTE — ED Provider Notes (Signed)
CSN: [id number]     Arrival date & time 03/18/14  0059 History   First MD Initiated Contact with Patient 03/18/14 [id number]     Chief Complaint  Patient presents with  . Hip Pain  . Fall     (Consider location/radiation/quality/duration/timing/severity/associated sxs/prior Treatment) Patient is a 74 y.o. female presenting with hip pain and fall. The history is provided by the patient and a relative. No language interpreter was used.  Hip Pain This is a new problem. The current episode started today. Pertinent negatives include no abdominal pain, chills, fever or numbness. Associated symptoms comments: She states she fell while walking with her walker, landing on the left hip. No head injury, neck/chest/abdominal pain. She feels she tried to turn to quickly and lost her balance. She denies lightheadedness or dizziness prior to falling. No LOC before or after the fall. .  Fall Pertinent negatives include no abdominal pain, chills, fever or numbness.    Past Medical History  Diagnosis Date  . Pneumonia, organism unspecified   . Status post chemotherapy  10/11/2012     carboplatin  . S/P radiation therapy 08/24/2012-09/11/2012    Whole Brain  / 32.5 Gy in 13 fractions  . 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 S/P radiation therapy  07/24/2012-08/10/2012      Mediastinum and Left Hilum / 35 Gy in 14 fractions    . Hypertension    Past Surgical History  Procedure Laterality Date  . Nasal sinus surgery    . Video bronchoscopy Bilateral 07/05/2012    Procedure: VIDEO BRONCHOSCOPY WITHOUT FLUORO;  Surgeon: Tanda Rockers, MD;  Location: [facility];  Service: Cardiopulmonary;  Laterality: Bilateral;  . Portacath placement     Family History  Problem Relation Age of Onset  . Heart disease Mother   . Heart disease Father     PVD  . Prostate cancer Father   . Lung cancer Mother 45    was a smoker  . Colon cancer Brother    History  Substance Use Topics  .  Smoking status: Former Smoker -- 1.00 packs/day for 40 years    Types: Cigarettes    Quit date: 04/18/2012  . Smokeless tobacco: Never Used  . Alcohol Use: No   OB History    No data available     Review of Systems  Constitutional: Negative for fever and chills.  HENT: Negative.   Respiratory: Negative.  Negative for shortness of breath.   Cardiovascular: Negative.   Gastrointestinal: Negative.  Negative for abdominal pain.  Genitourinary: Negative.   Musculoskeletal:       See HPI.  Skin: Negative.  Negative for wound.  Neurological: Negative.  Negative for syncope and numbness.  Hematological: Does not bruise/bleed easily.  Psychiatric/Behavioral: Negative for confusion.      Allergies  Asa; Codeine; and Tramadol  Home Medications   Prior to Admission medications   Medication Sig Start Date End Date Taking? Authorizing Provider  acetaminophen (TYLENOL) 500 MG tablet Take 1,000 mg by mouth every 6 (six) hours as needed (For headache.).   Yes Historical Provider, MD  dexamethasone (DECADRON) 4 MG tablet Take 1 tablet (4 mg total) by mouth 2 (two) times daily. 03/09/14  Yes Theodis Blaze, MD  diphenoxylate-atropine (LOMOTIL) 2.5-0.025 MG per tablet Take 1 tablet by mouth 2 (two) times daily as needed for diarrhea or loose stools. 05/23/13  Yes Curt Bears, MD  docusate sodium (COLACE) 100 MG capsule  Take 1 capsule (100 mg total) by mouth 2 (two) times daily. If taking pain medications daily. Otherwise take as needed for constipation 03/02/14  Yes Bonnielee Haff, MD  GLUCOSAMINE HCL PO Take 1 tablet by mouth daily.   Yes Historical Provider, MD  lidocaine-prilocaine (EMLA) cream Apply 1 application topically daily as needed (Applies to port-a-cath.). 05/10/13  Yes Curt Bears, MD  metoprolol tartrate (LOPRESSOR) 25 MG tablet Take 1 tablet (25 mg total) by mouth 2 (two) times daily. 03/02/14  Yes Bonnielee Haff, MD  mirabegron ER (MYRBETRIQ) 25 MG TB24 tablet Take 25 mg by  mouth daily.   Yes Historical Provider, MD  Multiple Vitamin (MULTIVITAMIN WITH MINERALS) TABS Take 1 tablet by mouth daily with lunch. She takes Dance movement psychotherapist Adult 50+.   Yes Historical Provider, MD  ondansetron (ZOFRAN) 8 MG tablet Take 1 tablet (8 mg total) by mouth every 8 (eight) hours as needed for nausea or vomiting. 11/16/13  Yes Curt Bears, MD  oxyCODONE-acetaminophen (PERCOCET/ROXICET) 5-325 MG per tablet Take 1-2 tablets by mouth every 6 (six) hours as needed for severe pain. 03/09/14  Yes Theodis Blaze, MD  polyethylene glycol [facility] / Floria Raveling) packet Take 17 g by mouth daily as needed.   Yes Historical Provider, MD  prochlorperazine (COMPAZINE) 10 MG tablet Take 1 tablet (10 mg total) by mouth every 6 (six) hours as needed for nausea or vomiting. 11/16/13  Yes Curt Bears, MD  senna (SENOKOT) 8.6 MG TABS tablet Take 1 tablet by mouth daily as needed for mild constipation.   Yes Historical Provider, MD  azithromycin (ZITHROMAX) 250 MG tablet Take 1 tablet (250 mg total) by mouth daily. Patient not taking: Reported on 03/18/2014 03/09/14   Theodis Blaze, MD   BP 186/101 mmHg  Pulse 88  Temp(Src) 97.6 F (36.4 C) (Oral)  Resp 18  Ht 5\' 2"  (1.575 m)  Wt 106 lb (48.081 kg)  BMI 19.38 kg/m2  SpO2 99% Physical Exam  Constitutional: She is oriented to person, place, and time. She appears well-developed and well-nourished.  HENT:  Head: Atraumatic.  Eyes: Conjunctivae are normal.  Neck: Normal range of motion. Neck supple.  Cardiovascular: Normal rate and intact distal pulses.   No murmur heard. Pulmonary/Chest: Effort normal. She has no wheezes. She has no rales. She exhibits no tenderness.  Abdominal: Soft. There is no tenderness.  Musculoskeletal: Normal range of motion. She exhibits no edema.  Left leg shortened and externally rotated. Tender over hip laterally. No lumbar tenderness.   Neurological: She is alert and oriented to person, place, and time. No cranial  nerve deficit.  Skin: Skin is warm and dry.  Psychiatric: She has a normal mood and affect.    ED Course  Procedures (including critical care time) Labs Review Labs Reviewed  BASIC METABOLIC PANEL - Abnormal; Notable for the following:    Sodium 130 (*)    Chloride 94 (*)    Glucose, Bld 118 (*)    Creatinine, Ser 1.13 (*)    GFR calc non Af Amer 47 (*)    GFR calc Af Amer 54 (*)    All other components within normal limits  CBC WITH DIFFERENTIAL/PLATELET - Abnormal; Notable for the following:    WBC 12.9 (*)    Neutrophils Relative % 87 (*)    Neutro Abs 11.3 (*)    Lymphocytes Relative 8 (*)    All other components within normal limits  PROTIME-INR  URINALYSIS, ROUTINE W REFLEX MICROSCOPIC  TYPE  AND SCREEN    Imaging Review Dg Chest Port 1 View  03/18/2014   CLINICAL DATA:  Status post fall; concern for chest injury. Initial encounter.  EXAM: PORTABLE CHEST - 1 VIEW  COMPARISON:  Chest radiograph performed 03/07/2014  FINDINGS: Vascular congestion has mildly improved from the prior study. Mild bilateral atelectasis is noted, with persistent elevation of the left hemidiaphragm. Left upper lobe opacity is grossly stable in appearance, reflecting the patient's known malignancy, with left-sided volume loss. No pleural effusion or pneumothorax is seen. Additional left-sided pulmonary nodules are better characterized on recent CT.  The cardiomediastinal silhouette is mildly enlarged. A right-sided chest port is noted ending about the distal SVC. No acute osseous abnormalities are identified.  IMPRESSION: 1. No displaced rib fracture seen. 2. Vascular congestion and mild cardiomegaly. Mild bilateral atelectasis, with persistent elevation of the left hemidiaphragm. Stable appearance to left upper lobe opacity, reflecting the patient's known malignancy, with left-sided volume loss.   Electronically Signed   By: Garald Balding M.D.   On: 03/18/2014 02:28   Dg Hip Unilat With Pelvis 2-3 Views  Left  03/18/2014   CLINICAL DATA:  Status post fall onto hard tile floor. Left hip pain. Initial encounter.  EXAM: LEFT HIP (WITH PELVIS) 2-3 VIEWS  COMPARISON:  None.  FINDINGS: There appears to be a basicervical fracture through the left femoral neck, with mild rotation of the distal femur. The left femoral head remains seated at the acetabulum. The right hip joint is unremarkable. No significant degenerative change is appreciated. The sacroiliac joints are unremarkable in appearance.  The visualized bowel gas pattern is grossly unremarkable in appearance. Scattered phleboliths are noted within the pelvis.  IMPRESSION: Basicervical fracture through the left femoral neck, with mild rotation of the distal femur.   Electronically Signed   By: Garald Balding M.D.   On: 03/18/2014 02:25     EKG Interpretation None      MDM   Final diagnoses:  Fall  Femoral neck fracture, left, closed, initial encounter    Discussed with Dr. Tamera Punt who advised his preference for admission and surgery was [facility] and requested she be transferred. Discussed oncologic treatment - still ok to transfer. Discussed with Dr. Posey Pronto of [facility] who will see patient in the ED and facilitate transfer.   The patient's pain is improved. VSS. Transfer to [facility] initiated.    Dewaine Oats, PA-C 03/18/14 [place], MD 03/18/14 [phone]

## 2014-03-18 NOTE — Progress Notes (Signed)
Called regarding risk with recent stroke. Unfortunately, with any procedure in the immediate peri-stroke period there is an increased risk. Currently, for elective procedures, I think that 9 months is not unreasonable, however this is not an elective case and causing her to be bed ridden for a prolonged period of time could certainly have dire consequences. Risks and benefits would need to be considered, but by no means do I feel that her neurological condition represents an absolute contraindication as long as the decision makers are aware that there is risk.   Please call if I can be of any further assistance.   Roland Rack, MD [facility] [phone]  If 7pm- 7am, please page neurology on call as listed in [place].

## 2014-03-18 NOTE — Anesthesia Postprocedure Evaluation (Signed)
  Anesthesia Post-op Note  Patient: Cheryl Nielsen  Procedure(s) Performed: Procedure(s): LEFT HEMI-ARTHROPLASTY HIP (Left)  Patient Location: PACU  Anesthesia Type: Spinal   Level of Consciousness: awake, alert  and oriented  Airway and Oxygen Therapy: Patient Spontanous Breathing  Post-op Pain: mild  Post-op Assessment: Post-op Vital signs reviewed  Post-op Vital Signs: Reviewed  Last Vitals:  Filed Vitals:   03/18/14 2135  BP: 100/60  Pulse: 96  Temp: 36.4 C  Resp: 13    Complications: No apparent anesthesia complications

## 2014-03-18 NOTE — Anesthesia Preprocedure Evaluation (Addendum)
Anesthesia Evaluation  Patient identified by MRN, date of birth, ID band Patient awake    Reviewed: Allergy & Precautions, NPO status , Patient's Chart, lab work & pertinent test results, reviewed documented beta blocker date and time   Airway Mallampati: II  TM Distance: >3 FB Neck ROM: Full    Dental  (+) Teeth Intact, Dental Advisory Given   Pulmonary former smoker (40 pack year),  Lung CA, pleural effusion breath sounds clear to auscultation  + decreased breath sounds      Cardiovascular hypertension, Pt. 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   Neuro/Psych Brain mets    GI/Hepatic   Endo/Other    Renal/GU GFR 47     Musculoskeletal   Abdominal   Peds  Hematology  (+) anemia , 11/33   Anesthesia Other Findings   Reproductive/Obstetrics                          Anesthesia Physical Anesthesia Plan  ASA: III  Anesthesia Plan: General and Spinal   Post-op Pain Management:    Induction: Intravenous  Airway Management Planned: Oral ETT  Additional Equipment:   Intra-op Plan:   Post-operative Plan: Extubation in OR  Informed Consent: I have reviewed the patients History and Physical, chart, labs and discussed the procedure including the risks, benefits and alternatives for the proposed anesthesia with the patient or authorized representative who has indicated his/her understanding and acceptance.   Dental advisory given  Plan Discussed with: CRNA and Anesthesiologist  Anesthesia Plan Comments: (Tylenol OK)      Anesthesia Quick Evaluation

## 2014-03-18 NOTE — Progress Notes (Signed)
Report received from Shanon Brow, RN in PACU.

## 2014-03-18 NOTE — ED Notes (Addendum)
Resting quietly with eye closed. Easily arousable. Verbally responsive. Resp even and unlabored. ABC's intact. SR to ST on monitor. Family at bedside. IV saline lock patent and intact. F/C patent and intact draining yellow urine without difficulty. NAD noted.

## 2014-03-18 NOTE — ED Notes (Signed)
F/C #14 FR inserted using sterile technique d/t pt having lt hip fx. F/C insertion witnessed via this nurse.

## 2014-03-18 NOTE — ED Notes (Signed)
Bed: QG92 Expected date: 03/18/14 Expected time: 12:30 AM Means of arrival:  Comments: EMS fall / hip pain

## 2014-03-18 NOTE — ED Notes (Signed)
[facility] arrived to transport pt to [facility].

## 2014-03-18 NOTE — ED Notes (Signed)
Awake. Verbally responsive. A/O x4. Resp even and unlabored. No audible adventitious breath sounds noted. ABC's intact. SR on monitor at 94bpm. IV saline lock patent and intact. Family at bedside.

## 2014-03-18 NOTE — Progress Notes (Signed)
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 Patient was recently admitted at [facility] 2/14-2/20 for acute encephalopathy. MRI had shown acute punctate left basal ganglia lacunar infarct, no new or progressive brain metastases were identified.  Patient was at her baseline fairly active, walking with walker, woke up in the night to go to the restroom and while walking back suddenly lost her balance and fell on the floor. The patient was found to have a left femoral neck fracture. Patient was transferred to [facility] for further management.  Assessment/Plan: Principal Problem:  Left Femoral neck fracture - Currently nothing by mouth, patient has been seen by orthopedics, requesting neurology consultation prior to the surgery - I discussed in detail with Dr. Leonel Ramsay from neurology service, other than holding the antiplatelet agents, which puts her at high risk of having recurrent CVA, there are no other recommendations. PTA meds and DC summary does not show patient on any aspirin/Plavix or anticoagulation, family had opted no neurology workup at that time and had requested for conservative management given patient has lung cancer with brain metastases. I discussed this with patient's daughter and another daughter on the phone and patient herself, she wants to go through with the surgery. - Pain management and DVT prophylaxis per orthopedic surgery   Active Problems:   Small cell lung cancer, Brain metastases - Continue Decadron, patient is following Dr. Julien Nordmann outpatient  Chronic  Hyponatremia - Possibly due to brain  metastasis, currently patient NPO, placed on gentle hydration    Protein-calorie malnutrition, severe - nutrition consult once patient taking oral diet    Anemia in neoplastic disease - Follow CBC closely  DVT Prophylaxis: SCDs  Code Status: DO NOT RESUSCITATE  Family Communication: Discussed with patient's daughters in detail and [name] on the phone   Disposition:  Consultants:  Orthopedics  Neurology  Procedures:  none  Antibiotics:  none    Subjective: Patient seen and examined, currently comfortable, pain controlled  Objective: Weight change:   Intake/Output Summary (Last 24 hours) at 03/18/14 1207 Last data filed at 03/18/14 0353  Gross per 24 hour  Intake      0 ml  Output    100 ml  Net   -100 ml   Blood pressure 153/89, pulse 104, temperature 98.1 F (36.7 C), temperature source Oral, resp. rate 16, height 5' 2.5" (1.588 m), weight 49.442 kg (109 lb), SpO2 100 %.  Physical Exam: General: Alert and awake, oriented x3, not in any acute distress. CVS: S1-S2 clear, no murmur rubs or gallops Chest: clear to auscultation bilaterally, no wheezing, rales or rhonchi Abdomen: soft nontender, nondistended, normal bowel sounds  Extremities: no cyanosis, clubbing or edema noted bilaterally  Lab Results: Basic Metabolic Panel:  Recent Labs Lab 03/18/14 0142  NA 130*  K 4.2  CL 94*  CO2 27  GLUCOSE 118*  BUN 22  CREATININE 1.13*  CALCIUM 9.0   Liver Function Tests: No results for input(s): AST, ALT, ALKPHOS, BILITOT, PROT, ALBUMIN in the last 168 hours. No results for input(s): LIPASE, AMYLASE in the last 168 hours. No results for input(s): AMMONIA in the last  168 hours. CBC:  Recent Labs Lab 03/18/14 0142  WBC 12.9*  NEUTROABS 11.3*  HGB 12.4  HCT 37.2  MCV 91.4  PLT 265   Cardiac Enzymes: No results for input(s): CKTOTAL, CKMB, CKMBINDEX, TROPONINI in the last 168 hours. BNP: Invalid input(s): POCBNP CBG: No results for input(s):  GLUCAP in the last 168 hours.   Micro Results: No results found for this or any previous visit (from the past 240 hour(s)).  Studies/Results: Ct Abdomen Pelvis Wo Contrast  02/22/2014   CLINICAL DATA:  Current history of left-sided small-cell lung cancer.  EXAM: CT CHEST, ABDOMEN AND PELVIS WITHOUT CONTRAST  TECHNIQUE: Multidetector CT imaging of the chest, abdomen and pelvis was performed following the standard protocol without IV contrast.  COMPARISON:  CT scan of January 04, 2014.  FINDINGS: CT CHEST FINDINGS  No pneumothorax or significant pleural effusion is noted. Irregular mass density measuring 25 x 14 mm is noted laterally in the left upper lobe with adjacent 18 x 15 mm pleural-based abnormality. These are not significantly changed compared to prior exam. Stable 10 x 7 mm lingular nodule is noted. Stable scarring or post radiation changes are noted in the left upper lobe. Stable right apical scarring is noted. Stable small left lower lobe nodules are noted. No new nodules or masses are seen.  Anterior superior mediastinal adenopathy measuring 4.4 x 2.5 cm is noted which is not significantly changed compared to prior exam. Right internal jugular Port-A-Cath is noted with distal tip at the cavoatrial junction. Minimal pericardial effusion is noted. Sclerotic densities are noted in the T8 and T9 vertebral bodies which are unchanged compared to prior exam and consistent with metastatic disease.  CT ABDOMEN AND PELVIS FINDINGS  Large right hepatic metastatic lesion is again noted measuring 6.0 x 5.5 cm in size, not significantly changed compared to prior exam. No gallstones are noted. Spleen and pancreas appear normal. Kidneys appear normal. No hydronephrosis or renal obstruction is noted. Stable bilateral adrenal masses are noted consistent with metastatic disease. Mild atherosclerotic calcifications of abdominal aorta are noted without aneurysm formation. The appendix appears normal. There is no  evidence of bowel obstruction. No abnormal fluid collection is noted. Urinary bladder and uterus appear normal. Ovaries appear normal. No significant adenopathy is noted. No significant adenopathy is noted.  Stable sclerotic lesions are noted in the proximal right femur, right superior acetabulum, right sacrum and left acetabulum compared to prior exam consistent with metastatic disease.  IMPRESSION: Stable left upper lobe lung masses are noted consistent with malignancy. Stable mediastinal adenopathy is noted. Stable lingular nodule is noted as well as several left lower lobe nodules.  Stable large right hepatic metastatic lesion.  Stable bilateral adrenal masses consistent with metastatic disease.  Stable osseous metastases as described above.   Electronically Signed   By: Sabino Dick M.D.   On: 02/22/2014 14:17   Dg Chest 2 View  03/01/2014   CLINICAL DATA:  Chest pain that started yesterday. Shortness of breath. Patient currently undergoing radiation therapy. History lung cancer.  EXAM: CHEST  2 VIEW  COMPARISON:  Chest CT - 02/22/2014  FINDINGS: Grossly unchanged cardiac silhouette and mediastinal contours with left-sided volume loss and mild deviation of the cardiomediastinal structures to the left. Stable positioning of support apparatus. Grossly unchanged nodular heterogeneous airspace opacities within the left mid lung. The lungs remain hyperexpanded. No new focal airspace opacities. No pleural effusion or pneumothorax. No evidence of edema. No acute osseus abnormalities.  IMPRESSION: Grossly unchanged left  mid lung heterogeneous opacities and volume loss compatible with provided history of lung cancer and undergoing radiation. No definite superimposed acute cardiopulmonary disease.   Electronically Signed   By: Sandi Mariscal M.D.   On: 03/01/2014 15:16   Ct Head Wo Contrast  03/01/2014   CLINICAL DATA:  74 year old with confusion. History of lung cancer with chest pain. History of intracranial  metastatic disease.  EXAM: CT HEAD WITHOUT CONTRAST  TECHNIQUE: Contiguous axial images were obtained from the base of the skull through the vertex without contrast.  COMPARISON:  Brain MRI 02/12/2014  FINDINGS: There is diffuse low density throughout the periventricular and subcortical white matter which is similar to the prior examination. Slightly asymmetric cerebral atrophy. No evidence for acute hemorrhage, large mass lesion, midline shift, hydrocephalus or large new infarct. Visualized sinuses are clear.  Patient has known calvarial metastatic lesions. Again noted is a 1.5 cm lucent lesion in the occipital bone. There is also a subtle bone lesion near the left middle cranial fossa.  IMPRESSION: No acute intracranial abnormality.  Patient has known multiple small brain metastasis which are not well appreciated on this examination. Evidence for osseous metastatic lesions.  Diffuse white matter disease is similar to the previous examination.   Electronically Signed   By: Markus Daft M.D.   On: 03/01/2014 16:23   Ct Head W Wo Contrast  03/04/2014   CLINICAL DATA:  Weakness, change in behavior. History of metastatic lung cancer with treated brain metastasis.  EXAM: CT HEAD WITHOUT AND WITH CONTRAST  TECHNIQUE: Contiguous axial images were obtained from the base of the skull through the vertex without and with intravenous contrast  CONTRAST:  72mL OMNIPAQUE IOHEXOL 300 MG/ML  SOLN  COMPARISON:  CT of the head March 01, 2014 and MRI of the head October 08, 2013  FINDINGS: Moderate ventriculomegaly, likely on the basis of global parenchymal brain volume loss as there is overall commensurate enlargement of cerebral sulci and cerebellar folia, unchanged. No intraparenchymal hemorrhage, mass effect, midline shift. Confluent supratentorial white matter hypodensities, similar to prior CT. No acute large vascular territory infarct. Faint enhancing 4 mm lesion in RIGHT frontal gray-white matter junction, axial 13 of  31, and appears new from prior MRI. Patient's known treated metastasis are difficult to discern on CT.  No abnormal extra-axial fluid collections or extra-axial enhancement, no definite extra-axial masses.  No paranasal sinus air-fluid levels. Mastoid air cells are well aerated. No skull fracture. Again noted is the lytic lesion within paracentral occipital calvarium. Ocular globes and orbital contents are nonsuspicious.  IMPRESSION: Enhancing 4 mm RIGHT frontal lobe lesion, concerning for new metastasis though, MRI of the brain with contrast would be more sensitive. Re- demonstration of osseous metastasis.  Moderate global parenchymal brain volume loss. Severe white matter changes, similar to prior examination may reflect sequelae of prior radiation.   Electronically Signed   By: Elon Alas   On: 03/04/2014 00:14   Ct Chest Wo Contrast  02/22/2014   CLINICAL DATA:  Current history of left-sided small-cell lung cancer.  EXAM: CT CHEST, ABDOMEN AND PELVIS WITHOUT CONTRAST  TECHNIQUE: Multidetector CT imaging of the chest, abdomen and pelvis was performed following the standard protocol without IV contrast.  COMPARISON:  CT scan of January 04, 2014.  FINDINGS: CT CHEST FINDINGS  No pneumothorax or significant pleural effusion is noted. Irregular mass density measuring 25 x 14 mm is noted laterally in the left upper lobe with adjacent 18 x 15 mm pleural-based abnormality. These  are not significantly changed compared to prior exam. Stable 10 x 7 mm lingular nodule is noted. Stable scarring or post radiation changes are noted in the left upper lobe. Stable right apical scarring is noted. Stable small left lower lobe nodules are noted. No new nodules or masses are seen.  Anterior superior mediastinal adenopathy measuring 4.4 x 2.5 cm is noted which is not significantly changed compared to prior exam. Right internal jugular Port-A-Cath is noted with distal tip at the cavoatrial junction. Minimal pericardial  effusion is noted. Sclerotic densities are noted in the T8 and T9 vertebral bodies which are unchanged compared to prior exam and consistent with metastatic disease.  CT ABDOMEN AND PELVIS FINDINGS  Large right hepatic metastatic lesion is again noted measuring 6.0 x 5.5 cm in size, not significantly changed compared to prior exam. No gallstones are noted. Spleen and pancreas appear normal. Kidneys appear normal. No hydronephrosis or renal obstruction is noted. Stable bilateral adrenal masses are noted consistent with metastatic disease. Mild atherosclerotic calcifications of abdominal aorta are noted without aneurysm formation. The appendix appears normal. There is no evidence of bowel obstruction. No abnormal fluid collection is noted. Urinary bladder and uterus appear normal. Ovaries appear normal. No significant adenopathy is noted. No significant adenopathy is noted.  Stable sclerotic lesions are noted in the proximal right femur, right superior acetabulum, right sacrum and left acetabulum compared to prior exam consistent with metastatic disease.  IMPRESSION: Stable left upper lobe lung masses are noted consistent with malignancy. Stable mediastinal adenopathy is noted. Stable lingular nodule is noted as well as several left lower lobe nodules.  Stable large right hepatic metastatic lesion.  Stable bilateral adrenal masses consistent with metastatic disease.  Stable osseous metastases as described above.   Electronically Signed   By: Sabino Dick M.D.   On: 02/22/2014 14:17   [name] Contrast  03/04/2014   CLINICAL DATA:  74 year old female with metastatic lung cancer status post radiation therapy. Restaging. Subsequent encounter.  EXAM: MRI HEAD WITH CONTRAST  TECHNIQUE: Multiplanar, multiecho pulse sequences of the brain and surrounding structures were obtained with intravenous contrast.  COMPARISON:  Head CT 01/01/2015.  Brain MRI 02/12/2014.  CONTRAST:  51mL MULTIHANCE GADOBENATE DIMEGLUMINE 529 MG/ML  IV SOLN  FINDINGS: Today's study a 1.5 Tesla using affect her post-contrast images than the 3 Tesla study on 07/08/2014.  Widely scattered small brain metastases re- identified. No definite new or progressed brain metastasis identified. Several of the smallest metastases identified previously may have mildly regressed column a or may be less visible due to technical factors on today's study.  No associated intracranial mass affect. Confluent cerebral white matter T2 and FLAIR hyperintensity suggesting sequelae of prior whole brain radiation is stable. No new or increased cerebral edema.  Osseous metastatic disease to the skull not significantly changed. No dural thickening or hyper enhancement identified.  There is a punctate focus of restricted diffusion without enhancement at the anterior inferior left basal ganglia seen on series 5 image 21 and series 6, image 26. There is mild associated T2 and FLAIR hyperintensity which is new. Series 8, image 11. No other diffusion abnormality.  Major intracranial vascular flow voids are stable. No ventriculomegaly. Stable small micro hemorrhage at the right vertex. No acute intracranial hemorrhage identified. Negative pituitary. Negative visualized spinal cord. Orbits, paranasal sinuses, and visualized internal auditory structures appear stable. Visualized scalp soft tissues are within normal limits.  IMPRESSION: 1. Punctate nonenhancing focus of restricted diffusion in the inferior  left basal ganglia, presumed acute lacunar infarct. No mass effect or hemorrhage. 2. No new or progressive brain metastasis identified. Stable or slight regression of metastatic Brain disease since 02/12/2014 allowing for technical differences between this exam and the prior. 3. No significant change in osseous metastatic disease to the skull.   Electronically Signed   By: Genevie Ann M.D.   On: 03/04/2014 14:50   Dg Chest Port 1 View  03/18/2014   CLINICAL DATA:  Status post fall; concern for  chest injury. Initial encounter.  EXAM: PORTABLE CHEST - 1 VIEW  COMPARISON:  Chest radiograph performed 03/07/2014  FINDINGS: Vascular congestion has mildly improved from the prior study. Mild bilateral atelectasis is noted, with persistent elevation of the left hemidiaphragm. Left upper lobe opacity is grossly stable in appearance, reflecting the patient's known malignancy, with left-sided volume loss. No pleural effusion or pneumothorax is seen. Additional left-sided pulmonary nodules are better characterized on recent CT.  The cardiomediastinal silhouette is mildly enlarged. A right-sided chest port is noted ending about the distal SVC. No acute osseous abnormalities are identified.  IMPRESSION: 1. No displaced rib fracture seen. 2. Vascular congestion and mild cardiomegaly. Mild bilateral atelectasis, with persistent elevation of the left hemidiaphragm. Stable appearance to left upper lobe opacity, reflecting the patient's known malignancy, with left-sided volume loss.   Electronically Signed   By: Garald Balding M.D.   On: 03/18/2014 02:28   Dg Chest Port 1 View  03/07/2014   CLINICAL DATA:  Dyspnea.  Left-sided lung cancer.  EXAM: PORTABLE CHEST - 1 VIEW  COMPARISON:  03/01/2014  FINDINGS: Right sided PowerPort tip overlies the level of superior vena cava. Heart size is enlarged. There is left upper lobe opacity unchanged compared with prior studies. There is left-sided volume loss. Heart size normal. There are no new consolidations or pleural effusions. Stable bronchitic changes.  IMPRESSION: 1. Stable appearance of left upper lobe opacity and left lung volume loss. 2. Stable bronchitic changes.   Electronically Signed   By: Nolon Nations M.D.   On: 03/07/2014 12:02   Ct Angio Chest Aorta W/cm &/or Wo/cm  03/01/2014   CLINICAL DATA:  Shortness of breath with chest pain and diaphoresis common known history of carcinoma lung  EXAM: CT ANGIOGRAPHY CHEST WITH CONTRAST  TECHNIQUE: Multidetector CT  imaging of the chest was performed using the standard protocol during bolus administration of intravenous contrast. Multiplanar CT image reconstructions and MIPs were obtained to evaluate the vascular anatomy.  CONTRAST:  189mL OMNIPAQUE IOHEXOL 350 MG/ML SOLN  COMPARISON:  02/22/2014  FINDINGS: The lungs are well aerated bilaterally. Scarring is again noted in the right upper lobe. No other significant changes noted within the right lung. There are changes in the left upper lobe consistent with the given clinical history of lung carcinoma. The overall appearance is stable from the recent exam. A few small nodules are noted in the left lower lobe also stable from the prior exam. No sizable effusion or pneumothorax is noted.  Considerable lymphadenopathy is noted encasing the vascular structures just above the aortic arch. These changes are stable in appearance from the prior study. No significant hilar adenopathy is noted. The thoracic aorta shows no findings to suggest dissection or aneurysmal dilatation. Minimal atherosclerotic calcifications are seen. Although not optimized for pulmonary emboli evaluation no large central embolus is seen.  The upper abdomen there again noted changes consistent with hepatic metastatic disease. The dominant lesion in the right lobe is stable. There is suggestion  of a smaller lesion in the lateral segment of left lobe of the liver posteriorly. Enlargement of the adrenal glands is noted bilaterally consistent with metastatic disease. Additionally large retrocrural lymph nodes are seen also consistent with localized metastatic disease. Sclerotic foci are again noted in the T8 and T9 vertebral bodies consistent with metastatic disease. Similar findings are noted in the L1, L3 and L4 vertebral bodies.  Review of the MIP images confirms the above findings.  IMPRESSION: No evidence of thoracic aortic dissection. No definitive pulmonary embolism is seen.  Changes consistent with the known  history of left upper lobe lung carcinoma with metastatic disease to the mediastinal and retrocrural lymph nodes as well as multiple thoracic and lumbar vertebra, adrenal glands and liver. Multiple small nodules are again noted in the left lower lobe.  No acute abnormality is noted.   Electronically Signed   By: Inez Catalina M.D.   On: 03/01/2014 16:27   Dg Hip Unilat With Pelvis 2-3 Views Left  03/18/2014   CLINICAL DATA:  Status post fall onto hard tile floor. Left hip pain. Initial encounter.  EXAM: LEFT HIP (WITH PELVIS) 2-3 VIEWS  COMPARISON:  None.  FINDINGS: There appears to be a basicervical fracture through the left femoral neck, with mild rotation of the distal femur. The left femoral head remains seated at the acetabulum. The right hip joint is unremarkable. No significant degenerative change is appreciated. The sacroiliac joints are unremarkable in appearance.  The visualized bowel gas pattern is grossly unremarkable in appearance. Scattered phleboliths are noted within the pelvis.  IMPRESSION: Basicervical fracture through the left femoral neck, with mild rotation of the distal femur.   Electronically Signed   By: Garald Balding M.D.   On: 03/18/2014 02:25    Medications: Scheduled Meds: . dexamethasone  4 mg Oral BID  . docusate sodium  100 mg Oral BID  . metoprolol tartrate  25 mg Oral BID   Time spent 25 minutes   LOS: 0 days   RAI,RIPUDEEP M.D. [facility] 03/18/2014, 12:07 PM Pager: [phone]  If 7PM-7AM, please contact night-coverage [email or web address] Password [email or web address]

## 2014-03-18 NOTE — Transfer of Care (Signed)
Immediate Anesthesia Transfer of Care Note  Patient: Cheryl Nielsen  Procedure(s) Performed: Procedure(s): LEFT HEMI-ARTHROPLASTY HIP (Left)  Patient Location: PACU  Anesthesia Type:MAC and Spinal  Level of Consciousness: awake and alert   Airway & Oxygen Therapy: Patient Spontanous Breathing and Patient connected to face mask oxygen  Post-op Assessment: Report given to RN and Post -op Vital signs reviewed and stable  Post vital signs: Reviewed and stable  Last Vitals:  Filed Vitals:   03/18/14 1815  BP: 93/66  Pulse: 101  Temp: 36.6 C  Resp: 15    Complications: No apparent anesthesia complications

## 2014-03-18 NOTE — ED Notes (Signed)
Pt arrived via EMS with report of lt hip pain, outer rotation, and lenght shorten s/p to fall. Pt was ambulating with walker and fell backwards onto lt hip on hard tile floor. Denies LOC. Noted LROM, (+)PMS and CRT brisk.

## 2014-03-18 NOTE — ED Notes (Signed)
Awake. Verbally responsive. A/O x4. Resp even and unlabored. No audible adventitious breath sounds noted. ABC's intact. SR on monitor at 96bpm. Family at bedside.

## 2014-03-18 NOTE — Progress Notes (Signed)
Utilization review completed.  

## 2014-03-18 NOTE — Op Note (Signed)
Procedure(s): LEFT HEMI-ARTHROPLASTY HIP Procedure Note  LATIFA NOBLE female 74 y.o. 03/18/2014  Procedure(s) and Anesthesia Type:    * LEFT HEMI-ARTHROPLASTY HIP - General  Surgeon(s) and Role:    * Nita Sells, MD - Primary   Indications:  74 y.o. female s/p fall with left hip fracture. Indicated for surgery to promote early ambulation, pain control and prevent complications of bed rest.     Surgeon: Nita Sells   Assistants: Jeanmarie Hubert PA-C (Danielle was present and scrubbed throughout the procedure and was essential in positioning, retraction, exposure, and closure)  Anesthesia: Spinal anesthesia    Procedure Detail  LEFT HEMI-ARTHROPLASTY HIP  Findings: DePuy press-fit Summit stem size 3, 44 head ball +0 neck. good stability.  Estimated Blood Loss:  200 mL         Drains: none  Blood Given: none          Specimens: none        Complications:  * No complications entered in OR log *         Disposition: PACU - hemodynamically stable.         Condition: stable    Procedure:  The patient was identified in the preoperative  holding area where I personally marked the operative site after  verifying site side and procedure with the patient. She was taken back  to the operating room where general anesthesia was induced without  Complication. The patient was placed in lateral decubitus position with the  left side up. The left lower extremity was then prepped and draped in standard sterile fashion. The patient did receive IV antibiotics prior to the  incision.   After the appropriate time-out, an approximately 12-cm  incision was made over the posterior third of the greater trochanter.  Dissection was carried down to the fascia which was split longitudinally  in line with the incision. The piriformis was tagged and the external  rotators were then taken down off the posterior greater trochanter in 1  sheath with the posterior  capsule. The fracture was exposed. Posterior  capsule and external rotators were split just below the piriformis down  to the level of the acetabulum. Great care was taken to protect the  sciatic nerve. The proximal femoral cut was then made using the cut  guide and the femoral head was then removed and sized and felt to be 44.  The proximal femur was then prepared by first using an intramedullary  canal finder, a lateralizer and then sequentially broaching from 1 to 3.   The size 44 head with +0 offset neck was then placed and a trial reduction was performed. It was felt  to be excellent in terms of the soft tissue tension. I was able to  bring up to 90 degrees of flexion and 60 degrees internal rotation  without any instability. Leg lengths were felt to be appropriate. The  hip was dislocated. The broach was then removed. It was felt to have an excellent press-fit no for the decision was made to proceed without cement, particularly in this patient with recent stroke try and prevent any possibility of further injury.  The size 3 stem was then impacted into place in approximately 15-20  degrees anteversion. The head was then placed and impacted. It  was then reduced after ensuring that there was nothing in the  acetabulum. The hip reduced nicely and again it was taken through the  trial. I was able to flex to 90  degrees, internal rotation to 60  without any instability. Soft tissue tension was excellent and lengths  were felt to be equal. The joint was then copiously irrigated with  normal saline with pulse lavage and then the external rotators were  repaired using Ethibond sutures through the greater trochanter and tied  over a bone bridge. The deep fascia was then closed using #1 Vicryl in  running fashion proximally and distally. The skin was then closed using  2-0 Vicryl in deep dermal layer and staples for skin closure. Sterile  dressings were then applied including Aquacel dressing. The  patient was  then placed in a knee immobilizer, rolled into supine position and  extubated. He was then transferred to the PACU in stable condition.    POSTOPERATIVE PLAN: The patient will be weightbearing as tolerated on the operative Extremity with posterior hip precautions and will have DVT prophylaxis of Lovenox starting tomorrow.

## 2014-03-18 NOTE — Progress Notes (Signed)
Orthopedic Tech Progress Note Patient Details:  Cheryl Nielsen October 28, 1940 [id number]  Ortho Devices Ortho Device/Splint Location: trapeze bar patient helper Ortho Device/Splint Interventions: Application   Hildred Priest 03/18/2014, 8:44 AM

## 2014-03-18 NOTE — ED Notes (Signed)
Pt returned from X-ray without distress noted.

## 2014-03-18 NOTE — ED Notes (Signed)
Patient transported to X-ray 

## 2014-03-18 NOTE — Anesthesia Procedure Notes (Addendum)
Procedure Name: MAC Date/Time: 03/18/2014 4:30 PM Performed by: Sabra Heck L Pre-anesthesia Checklist: Patient identified, Timeout performed, Emergency Drugs available, Suction available and Patient being monitored Oxygen Delivery Method: Simple face mask   Spinal Patient location during procedure: OR Start time: 03/18/2014 4:30 PM End time: 03/18/2014 4:35 PM Staffing Performed by: anesthesiologist  Preanesthetic Checklist Completed: patient identified, site marked, surgical consent, pre-op evaluation, timeout performed, IV checked, risks and benefits discussed and monitors and equipment checked Spinal Block Patient position: left lateral decubitus Prep: ChloraPrep Patient monitoring: heart rate, cardiac monitor, continuous pulse ox and blood pressure Approach: left paramedian Location: L3-4 Injection technique: single-shot Needle Needle type: Pencil-Tip  Needle gauge: 22 G Needle length: 9 cm Assessment Sensory level: T8 Additional Notes 10 mg 0.75% bupivacaine injected easily

## 2014-03-18 NOTE — H&P (Signed)
[facility] History and Physical  Patient: Cheryl Nielsen  MRN: [id number]  DOB: [birth year]  DOS: the patient was seen and examined on 03/18/2014 PCP: Lujean Amel, MD  Chief Complaint: Fall  HPI: Cheryl Nielsen is a 74 y.o. 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 The patient is presenting with complaints of fall. The patient is from [facility]. Patient was recently admitted there after presentation with acute endocrinopathy and which time she was found to have acute left basal ganglia infarct. Patient was at her baseline fairly active and was walking with a walker. Patient woke up in the night to go to the restroom and while she was walking back see suddenly lost her balance and fell on the ground. She denies any head injury any neck injury in a dizziness and lightheadedness any focal deficit. She denies any nausea vomiting diarrhea or burning urination. She denies any cough chest pain shortness of breath abdominal pain. Reportedly there is no recent change in her medication.  The patient is coming from [facility]. And at her baseline dependent for most of her ADL.  Review of Systems: as mentioned in the history of present illness.  A Comprehensive review of the other systems is negative.  Past Medical History  Diagnosis Date  . Pneumonia, organism unspecified   . Status post chemotherapy  10/11/2012     carboplatin  . S/P radiation therapy 08/24/2012-09/11/2012    Whole Brain  / 32.5 Gy in 13 fractions  . 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 S/P radiation therapy  07/24/2012-08/10/2012      Mediastinum and Left Hilum / 35 Gy in 14 fractions    . Hypertension    Past Surgical History  Procedure Laterality Date  . Nasal sinus surgery    . Video bronchoscopy Bilateral 07/05/2012    Procedure: VIDEO BRONCHOSCOPY WITHOUT FLUORO;   Surgeon: Tanda Rockers, MD;  Location: [facility];  Service: Cardiopulmonary;  Laterality: Bilateral;  . Portacath placement     Social History:  reports that she quit smoking about 22 months ago. Her smoking use included Cigarettes. She has a 40 pack-year smoking history. She has never used smokeless tobacco. She reports that she does not drink alcohol or use illicit drugs.  Allergies  Allergen Reactions  . Asa [Aspirin]     GI upset  . Codeine     syncope  . Tramadol Nausea And Vomiting    Family History  Problem Relation Age of Onset  . Heart disease Mother   . Heart disease Father     PVD  . Prostate cancer Father   . Lung cancer Mother 7    was a smoker  . Colon cancer Brother     Prior to Admission medications   Medication Sig Start Date End Date Taking? Authorizing Provider  acetaminophen (TYLENOL) 500 MG tablet Take 1,000 mg by mouth every 6 (six) hours as needed (For headache.).   Yes Historical Provider, MD  dexamethasone (DECADRON) 4 MG tablet Take 1 tablet (4 mg total) by mouth 2 (two) times daily. 03/09/14  Yes Theodis Blaze, MD  diphenoxylate-atropine (LOMOTIL) 2.5-0.025 MG per tablet Take 1 tablet by mouth 2 (two) times daily as needed for diarrhea or loose stools. 05/23/13  Yes Curt Bears, MD  docusate sodium (COLACE) 100 MG capsule Take 1 capsule (100 mg  total) by mouth 2 (two) times daily. If taking pain medications daily. Otherwise take as needed for constipation 03/02/14  Yes Bonnielee Haff, MD  GLUCOSAMINE HCL PO Take 1 tablet by mouth daily.   Yes Historical Provider, MD  lidocaine-prilocaine (EMLA) cream Apply 1 application topically daily as needed (Applies to port-a-cath.). 05/10/13  Yes Curt Bears, MD  metoprolol tartrate (LOPRESSOR) 25 MG tablet Take 1 tablet (25 mg total) by mouth 2 (two) times daily. 03/02/14  Yes Bonnielee Haff, MD  mirabegron ER (MYRBETRIQ) 25 MG TB24 tablet Take 25 mg by mouth daily.   Yes Historical Provider, MD  Multiple  Vitamin (MULTIVITAMIN WITH MINERALS) TABS Take 1 tablet by mouth daily with lunch. She takes Dance movement psychotherapist Adult 50+.   Yes Historical Provider, MD  ondansetron (ZOFRAN) 8 MG tablet Take 1 tablet (8 mg total) by mouth every 8 (eight) hours as needed for nausea or vomiting. 11/16/13  Yes Curt Bears, MD  oxyCODONE-acetaminophen (PERCOCET/ROXICET) 5-325 MG per tablet Take 1-2 tablets by mouth every 6 (six) hours as needed for severe pain. 03/09/14  Yes Theodis Blaze, MD  polyethylene glycol [facility] / Floria Raveling) packet Take 17 g by mouth daily as needed.   Yes Historical Provider, MD  prochlorperazine (COMPAZINE) 10 MG tablet Take 1 tablet (10 mg total) by mouth every 6 (six) hours as needed for nausea or vomiting. 11/16/13  Yes Curt Bears, MD  senna (SENOKOT) 8.6 MG TABS tablet Take 1 tablet by mouth daily as needed for mild constipation.   Yes Historical Provider, MD  azithromycin (ZITHROMAX) 250 MG tablet Take 1 tablet (250 mg total) by mouth daily. Patient not taking: Reported on 03/18/2014 03/09/14   Theodis Blaze, MD    Physical Exam: Filed Vitals:   03/18/14 0400 03/18/14 0413 03/18/14 0430 03/18/14 0445  BP: 148/83 178/74 133/97 159/93  Pulse: 99 96 101 101  Temp:      TempSrc:      Resp: 18 18 14 13   Height:      Weight:      SpO2: 99% 97% 98% 95%    General: Alert, Awake and Oriented to Time, Place and Person. Appear in mild distress Eyes: PERRL ENT: Oral Mucosa clear moist. Neck: no JVD Cardiovascular: S1 and S2 Present, no Murmur, Peripheral Pulses Present Respiratory: Bilateral Air entry equal and Decreased, Clear to Auscultation, noCrackles, no wheezes Abdomen: Bowel Sound  present , Soft and non tender Skin: no Rash Extremities: no Pedal edema, no calf tenderness Neurologic: Grossly no focal neuro deficit.  Labs on Admission:  CBC:  Recent Labs Lab 03/18/14 0142  WBC 12.9*  NEUTROABS 11.3*  HGB 12.4  HCT 37.2  MCV 91.4  PLT 265    CMP     Component  Value Date/Time   NA 130* 03/18/2014 0142   NA 137 02/20/2014 1120   K 4.2 03/18/2014 0142   K 4.1 02/20/2014 1120   CL 94* 03/18/2014 0142   CL 93* 07/10/2012 1544   CO2 27 03/18/2014 0142   CO2 27 02/20/2014 1120   GLUCOSE 118* 03/18/2014 0142   GLUCOSE 79 02/20/2014 1120   GLUCOSE 134* 07/10/2012 1544   BUN 22 03/18/2014 0142   BUN 17.1 02/20/2014 1120   CREATININE 1.13* 03/18/2014 0142   CREATININE 1.2* 02/20/2014 1120   CALCIUM 9.0 03/18/2014 0142   CALCIUM 9.2 02/20/2014 1120   PROT 6.5 03/04/2014 0446   PROT 6.9 02/20/2014 1120   ALBUMIN 3.6 03/04/2014 0446   ALBUMIN  3.7 02/20/2014 1120   AST 26 03/04/2014 0446   AST 21 02/20/2014 1120   ALT 18 03/04/2014 0446   ALT 23 02/20/2014 1120   ALKPHOS 88 03/04/2014 0446   ALKPHOS 152* 02/20/2014 1120   BILITOT 0.5 03/04/2014 0446   BILITOT <0.20 02/20/2014 1120   GFRNONAA 47* 03/18/2014 0142   GFRAA 54* 03/18/2014 0142    No results for input(s): LIPASE, AMYLASE in the last 168 hours.  No results for input(s): CKTOTAL, CKMB, CKMBINDEX, TROPONINI in the last 168 hours. BNP (last 3 results)  Recent Labs  03/01/14 1402  BNP 84.7    ProBNP (last 3 results) No results for input(s): PROBNP in the last 8760 hours.   Radiological Exams on Admission: Dg Chest Port 1 View  03/18/2014   CLINICAL DATA:  Status post fall; concern for chest injury. Initial encounter.  EXAM: PORTABLE CHEST - 1 VIEW  COMPARISON:  Chest radiograph performed 03/07/2014  FINDINGS: Vascular congestion has mildly improved from the prior study. Mild bilateral atelectasis is noted, with persistent elevation of the left hemidiaphragm. Left upper lobe opacity is grossly stable in appearance, reflecting the patient's known malignancy, with left-sided volume loss. No pleural effusion or pneumothorax is seen. Additional left-sided pulmonary nodules are better characterized on recent CT.  The cardiomediastinal silhouette is mildly enlarged. A right-sided chest  port is noted ending about the distal SVC. No acute osseous abnormalities are identified.  IMPRESSION: 1. No displaced rib fracture seen. 2. Vascular congestion and mild cardiomegaly. Mild bilateral atelectasis, with persistent elevation of the left hemidiaphragm. Stable appearance to left upper lobe opacity, reflecting the patient's known malignancy, with left-sided volume loss.   Electronically Signed   By: Garald Balding M.D.   On: 03/18/2014 02:28   Dg Hip Unilat With Pelvis 2-3 Views Left  03/18/2014   CLINICAL DATA:  Status post fall onto hard tile floor. Left hip pain. Initial encounter.  EXAM: LEFT HIP (WITH PELVIS) 2-3 VIEWS  COMPARISON:  None.  FINDINGS: There appears to be a basicervical fracture through the left femoral neck, with mild rotation of the distal femur. The left femoral head remains seated at the acetabulum. The right hip joint is unremarkable. No significant degenerative change is appreciated. The sacroiliac joints are unremarkable in appearance.  The visualized bowel gas pattern is grossly unremarkable in appearance. Scattered phleboliths are noted within the pelvis.  IMPRESSION: Basicervical fracture through the left femoral neck, with mild rotation of the distal femur.   Electronically Signed   By: Garald Balding M.D.   On: 03/18/2014 02:25    EKG: Independently reviewed. normal sinus rhythm, nonspecific ST and T waves changes.  Assessment/Plan Principal Problem:   Femoral neck fracture Active Problems:   Small cell lung cancer   Brain metastases   Hyponatremia   Protein-calorie malnutrition, severe   Anemia in neoplastic disease   Basal ganglia infarction   1. Femoral neck fracture  The patient is presenting with a fall. The fall was reportedly mechanical. Patient does not have any new focal deficit denies any head injury or neck injury headache or neck pain or blurring of the vision. With this she complained of pain on her left hip and patient is found to have  femur neck fracture. Orthopedic has been consulted and will be following up with the patient. They requested the patient to be transferred to [facility]. 1) Cardiac risk: Based on RCRI  > Recent History of cerebrovascular disease  With this the patient is  a moderate to high risk for adverse Cardiac outcome from surgery. Recommend further work up with neurology consultation prior to surgery.  2) Pulmonary risk: Recommend continue use of PRN nebulizer, and optimization of lund function with use of inhalers and incentive spirometry. Good pulmunary toilet.  3) General risk: Avoid major fluctuation in blood pressure intra-op and post operatively. Minimal sedation and Narcotics. Will request Surgeon to please Order Lovenox/DVT prophylaxis of his/her choice when OK from Surgeon's standpoint post op.  2. Accelerated hypertension. Likely secondary to pain. Continue pain management. Continue Lopressor  3. Small cell lung cancer with brain metastasis. Patient is on Decadron. This puts the patient at high risk for developing poor wound healing. Family was informed. Will consult oncology as well.  4. Chronic hyponatremia. Continue close monitoring.  Advance goals of care discussion:DNR/DNI Consults: Orthopedics  DVT Prophylaxis: mechanical compression device Nutrition: Nothing by mouth   Family Communication: family was present at bedside, opportunity was given to ask question and all questions were answered satisfactorily at the time of interview. Disposition: Admitted to inpatient in telemetry unit.  Author: Berle Mull, MD [facility] Pager: [phone] 03/18/2014, 4:50 AM    If 7PM-7AM, please contact night-coverage [email or web address] Password [email or web address]

## 2014-03-18 NOTE — Telephone Encounter (Signed)
[name] notified us that pt broke hip yeste and surgery today.

## 2014-03-18 NOTE — Consult Note (Signed)
Reason for Consult: Left hip fracture Referring Physician: Dr. [name] is an 74 y.o. female.  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 History of stroke 2 weeks ago. Fell on her way to the bathroom last night. She tells me it feels similar to what happened 2 weeks ago when she was diagnosed with a stroke. She suffered a left displaced femoral neck fracture. She was seen in the emergency department I was consult to for evaluation and management. She has pain with any attempted bearing or movement. Denies other musculoskeletal injuries.  Past Medical History  Diagnosis Date  . Pneumonia, organism unspecified   . Status post chemotherapy  10/11/2012     carboplatin  . S/P radiation therapy 08/24/2012-09/11/2012    Whole Brain  / 32.5 Gy in 13 fractions  . 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 S/P radiation therapy  07/24/2012-08/10/2012      Mediastinum and Left Hilum / 35 Gy in 14 fractions    . Hypertension     Past Surgical History  Procedure Laterality Date  . Nasal sinus surgery    . Video bronchoscopy Bilateral 07/05/2012    Procedure: VIDEO BRONCHOSCOPY WITHOUT FLUORO;  Surgeon: Tanda Rockers, MD;  Location: [facility];  Service: Cardiopulmonary;  Laterality: Bilateral;  . Portacath placement      Family History  Problem Relation Age of Onset  . Heart disease Mother   . Heart disease Father     PVD  . Prostate cancer Father   . Lung cancer Mother 75    was a smoker  . Colon cancer Brother     Social History:  reports that she quit smoking about 22 months ago. Her smoking use included Cigarettes. She has a 40 pack-year smoking history. She has never used smokeless tobacco. She reports that she does not drink alcohol or use illicit drugs.  Allergies:  Allergies  Allergen Reactions  . Asa [Aspirin]     GI upset  . Codeine     syncope  . Tramadol Nausea And Vomiting     Medications: I have reviewed the patient's current medications.  Results for orders placed or performed during the hospital encounter of 03/18/14 (from the past 48 hour(s))  Basic metabolic panel     Status: Abnormal   Collection Time: 03/18/14  1:42 AM  Result Value Ref Range   Sodium 130 (L) 135 - 145 mmol/L   Potassium 4.2 3.5 - 5.1 mmol/L   Chloride 94 (L) 96 - 112 mmol/L   CO2 27 19 - 32 mmol/L   Glucose, Bld 118 (H) 70 - 99 mg/dL   BUN 22 6 - 23 mg/dL   Creatinine, Ser 1.13 (H) 0.50 - 1.10 mg/dL   Calcium 9.0 8.4 - 10.5 mg/dL   GFR calc non Af Amer 47 (L) >90 mL/min   GFR calc Af Amer 54 (L) >90 mL/min    Comment: (NOTE) The eGFR has been calculated using the CKD EPI equation. This calculation has not been validated in all clinical situations. eGFR's persistently <90 mL/min signify possible Chronic Kidney Disease.    Anion gap 9 5 - 15  CBC with Differential     Status: Abnormal   Collection Time: 03/18/14  1:42 AM  Result Value Ref Range   WBC 12.9 (H) 4.0 - 10.5 K/uL   RBC 4.07 3.87 - 5.11 MIL/uL  Hemoglobin 12.4 12.0 - 15.0 g/dL   HCT 37.2 36.0 - 46.0 %   MCV 91.4 78.0 - 100.0 fL   MCH 30.5 26.0 - 34.0 pg   MCHC 33.3 30.0 - 36.0 g/dL   RDW 14.8 11.5 - 15.5 %   Platelets 265 150 - 400 K/uL   Neutrophils Relative % 87 (H) 43 - 77 %   Neutro Abs 11.3 (H) 1.7 - 7.7 K/uL   Lymphocytes Relative 8 (L) 12 - 46 %   Lymphs Abs 1.0 0.7 - 4.0 K/uL   Monocytes Relative 4 3 - 12 %   Monocytes Absolute 0.5 0.1 - 1.0 K/uL   Eosinophils Relative 1 0 - 5 %   Eosinophils Absolute 0.1 0.0 - 0.7 K/uL   Basophils Relative 0 0 - 1 %   Basophils Absolute 0.0 0.0 - 0.1 K/uL  ABO/Rh     Status: None   Collection Time: 03/18/14  2:55 AM  Result Value Ref Range   ABO/RH(D) O POS   Protime-INR     Status: None   Collection Time: 03/18/14  2:59 AM  Result Value Ref Range   Prothrombin Time 13.0 11.6 - 15.2 seconds   INR 0.97 0.00 - 1.49  Type and screen     Status: None    Collection Time: 03/18/14  2:59 AM  Result Value Ref Range   ABO/RH(D) O POS    Antibody Screen NEG    Sample Expiration 03/21/2014   Urinalysis, Routine w reflex microscopic     Status: None   Collection Time: 03/18/14  4:27 AM  Result Value Ref Range   Color, Urine YELLOW YELLOW   APPearance CLEAR CLEAR   Specific Gravity, Urine 1.016 1.005 - 1.030   pH 7.0 5.0 - 8.0   Glucose, UA NEGATIVE NEGATIVE mg/dL   Hgb urine dipstick NEGATIVE NEGATIVE   Bilirubin Urine NEGATIVE NEGATIVE   Ketones, ur NEGATIVE NEGATIVE mg/dL   Protein, ur NEGATIVE NEGATIVE mg/dL   Urobilinogen, UA 0.2 0.0 - 1.0 mg/dL   Nitrite NEGATIVE NEGATIVE   Leukocytes, UA NEGATIVE NEGATIVE    Comment: MICROSCOPIC NOT DONE ON URINES WITH NEGATIVE PROTEIN, BLOOD, LEUKOCYTES, NITRITE, OR GLUCOSE <1000 mg/dL.    Dg Chest Port 1 View  03/18/2014   CLINICAL DATA:  Status post fall; concern for chest injury. Initial encounter.  EXAM: PORTABLE CHEST - 1 VIEW  COMPARISON:  Chest radiograph performed 03/07/2014  FINDINGS: Vascular congestion has mildly improved from the prior study. Mild bilateral atelectasis is noted, with persistent elevation of the left hemidiaphragm. Left upper lobe opacity is grossly stable in appearance, reflecting the patient's known malignancy, with left-sided volume loss. No pleural effusion or pneumothorax is seen. Additional left-sided pulmonary nodules are better characterized on recent CT.  The cardiomediastinal silhouette is mildly enlarged. A right-sided chest port is noted ending about the distal SVC. No acute osseous abnormalities are identified.  IMPRESSION: 1. No displaced rib fracture seen. 2. Vascular congestion and mild cardiomegaly. Mild bilateral atelectasis, with persistent elevation of the left hemidiaphragm. Stable appearance to left upper lobe opacity, reflecting the patient's known malignancy, with left-sided volume loss.   Electronically Signed   By: Garald Balding M.D.   On: 03/18/2014  02:28   Dg Hip Unilat With Pelvis 2-3 Views Left  03/18/2014   CLINICAL DATA:  Status post fall onto hard tile floor. Left hip pain. Initial encounter.  EXAM: LEFT HIP (WITH PELVIS) 2-3 VIEWS  COMPARISON:  None.  FINDINGS: There  appears to be a basicervical fracture through the left femoral neck, with mild rotation of the distal femur. The left femoral head remains seated at the acetabulum. The right hip joint is unremarkable. No significant degenerative change is appreciated. The sacroiliac joints are unremarkable in appearance.  The visualized bowel gas pattern is grossly unremarkable in appearance. Scattered phleboliths are noted within the pelvis.  IMPRESSION: Basicervical fracture through the left femoral neck, with mild rotation of the distal femur.   Electronically Signed   By: Garald Balding M.D.   On: 03/18/2014 02:25    Review of Systems  All other systems reviewed and are negative.  Blood pressure 153/89, pulse 104, temperature 98.1 F (36.7 C), temperature source Oral, resp. rate 16, height 5' 2.5" (1.588 m), weight 49.442 kg (109 lb), SpO2 100 %. Physical Exam  Constitutional: She is oriented to person, place, and time. She appears well-developed.  HENT:  Head: Atraumatic.  Eyes: EOM are normal.  Cardiovascular: Intact distal pulses.   Respiratory: Effort normal.  Musculoskeletal:  Left lower extremity shortened and externally rotated. Pain with any attempted motion. No tenderness over the knee or ankle. Distally neurovascularly intact. Bilateral upper extremities without tenderness or pain with range of motion.  Neurological: She is alert and oriented to person, place, and time.  Skin: Skin is warm and dry.  Psychiatric: She has a normal mood and affect.    Assessment/Plan: Left displaced femoral neck fracture in a patient with multiple severe medical comorbidities Neurology to evaluate today. If she is cleared for surgery I will plan on taking her today around 4 PM. Keep her  nothing by mouth for now in anticipation of possible surgery. Certainly she has increased risk factors going into surgery, however benefits of early ambulation and preventing complications of bedrest likely outweigh risks of surgery.  Cheryl Nielsen 03/18/2014, 7:56 AM

## 2014-03-19 ENCOUNTER — Encounter (HOSPITAL_COMMUNITY): Payer: Self-pay | Admitting: Orthopedic Surgery

## 2014-03-19 ENCOUNTER — Ambulatory Visit: Payer: Medicare Other

## 2014-03-19 DIAGNOSIS — W19XXXD Unspecified fall, subsequent encounter: Secondary | ICD-10-CM

## 2014-03-19 LAB — BASIC METABOLIC PANEL
Anion gap: 9 (ref 5–15)
BUN: 19 mg/dL (ref 6–23)
CALCIUM: 8 mg/dL — AB (ref 8.4–10.5)
CHLORIDE: 97 mmol/L (ref 96–112)
CO2: 24 mmol/L (ref 19–32)
CREATININE: 1.09 mg/dL (ref 0.50–1.10)
GFR calc Af Amer: 57 mL/min — ABNORMAL LOW (ref >90)
GFR calc non Af Amer: 49 mL/min — ABNORMAL LOW (ref >90)
GLUCOSE: 120 mg/dL — AB (ref 70–99)
Potassium: 5 mmol/L (ref 3.5–5.1)
Sodium: 130 mmol/L — ABNORMAL LOW (ref 135–145)

## 2014-03-19 LAB — CBC
HCT: 21 % — ABNORMAL LOW (ref 36.0–46.0)
Hemoglobin: 7.1 g/dL — ABNORMAL LOW (ref 12.0–15.0)
MCH: 30.6 pg (ref 26.0–34.0)
MCHC: 33.8 g/dL (ref 30.0–36.0)
MCV: 90.5 fL (ref 78.0–100.0)
PLATELETS: 147 10*3/uL — AB (ref 150–400)
RBC: 2.32 MIL/uL — AB (ref 3.87–5.11)
RDW: 15 % (ref 11.5–15.5)
WBC: 13.2 10*3/uL — ABNORMAL HIGH (ref 4.0–10.5)

## 2014-03-19 LAB — ABO/RH: ABO/RH(D): O POS

## 2014-03-19 LAB — PREPARE RBC (CROSSMATCH)

## 2014-03-19 MED ORDER — BOOST / RESOURCE BREEZE PO LIQD
1.0000 | Freq: Three times a day (TID) | ORAL | Status: DC
  Administered 2014-03-19: 1 via ORAL

## 2014-03-19 MED ORDER — SODIUM CHLORIDE 0.9 % IJ SOLN
10.0000 mL | INTRAMUSCULAR | Status: DC | PRN
  Administered 2014-03-19 – 2014-03-22 (×3): 10 mL
  Filled 2014-03-19 (×2): qty 40

## 2014-03-19 MED ORDER — LIDOCAINE-PRILOCAINE 2.5-2.5 % EX CREA
TOPICAL_CREAM | Freq: Once | CUTANEOUS | Status: AC
  Administered 2014-03-19: 1 via TOPICAL
  Filled 2014-03-19: qty 5

## 2014-03-19 MED ORDER — HYDROCORTISONE NA SUCCINATE PF 100 MG IJ SOLR
50.0000 mg | Freq: Once | INTRAMUSCULAR | Status: AC
  Administered 2014-03-19: 50 mg via INTRAVENOUS
  Filled 2014-03-19: qty 1

## 2014-03-19 MED ORDER — SODIUM CHLORIDE 0.9 % IV BOLUS (SEPSIS)
1000.0000 mL | Freq: Once | INTRAVENOUS | Status: AC
  Administered 2014-03-19: 1000 mL via INTRAVENOUS

## 2014-03-19 MED ORDER — SODIUM CHLORIDE 0.9 % IV SOLN: Freq: Once | INTRAVENOUS | Status: DC

## 2014-03-19 MED ORDER — HYDROCORTISONE NA SUCCINATE PF 100 MG IJ SOLR
100.0000 mg | Freq: Once | INTRAMUSCULAR | Status: DC
Start: 2014-03-19 — End: 2014-03-19
  Filled 2014-03-19: qty 2

## 2014-03-19 NOTE — Clinical Social Work Psychosocial (Cosign Needed)
Clinical Social Work Department BRIEF PSYCHOSOCIAL ASSESSMENT 03/19/2014  Patient:  Cheryl Nielsen,Cheryl Nielsen     Account Number:  [id number]     Admit date:  03/18/2014  Clinical Social Worker:  Kindred Reidinger, CLINICAL SOCIAL WORKER  Date/Time:  03/19/2014 10:44 AM  Referred by:  Physician  Date Referred:  03/19/2014 Referred for  SNF Placement   Other Referral:   none.   Interview type:  Family Other interview type:   none.    PSYCHOSOCIAL DATA Living Status:  FACILITY Admitted from facility:  [facility] Level of care:  Skilled Nursing Facility Primary support name:  [name] Ramsy Primary support relationship to patient:  CHILD, ADULT Degree of support available:   Adequate support.    CURRENT CONCERNS Current Concerns  Post-Acute Placement   Other Concerns:   none.    SOCIAL WORK ASSESSMENT / PLAN CSW and BSW-Intern consulted regarding possible SNF placement for pt once medically stable for discharge.    BSW-Intern was informed that pt has encephalopathy, therefore contacted pt's daughter ([name]) to complete pt's assessment. Pt's daughter informed BSW-Intern that pt previously lived alone, however pt had a stroke about two weeks ago and was placed in [facility].     Pt's daughter [name] informed BSW-Intern that family does not want pt to return to [facility], reason being for previous complaintsfrom pt and pt's family. Family of the pt is still unclear as to where they will be placing pt once pt is medically stable for discharge, however pt's daughter was able to inform BSW-Intern that after pt is discharged from SNF, pt's children and other family memebrs will take turns checking in on pt weekly and met pt's needs.    RN Case Manager from MD office to assist ith placement of pt for discharge once medically stable.    CSW to contiue to assit with discharge planning needs.   Assessment/plan status:  Psychosocial Support/Ongoing Assessment of Needs Other  assessment/ plan:   none.   Information/referral to community resources:   Pt to be discharged to SNF once medically stable for discharge.    PATIENT'S/FAMILY'S RESPONSE TO PLAN OF CARE: Pt's family understanding and agreeable to CSW plan of care. Pt's family expressed no further questions or concerns at this time.       Rozell Theiler S. Ezreal Turay, BSW-Intern  

## 2014-03-19 NOTE — Progress Notes (Signed)
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 Patient was recently admitted at [facility] 2/14-2/20 for acute encephalopathy. MRI had shown acute punctate left basal ganglia lacunar infarct, no new or progressive brain metastases were identified.  Patient was at her baseline fairly active, walking with walker, woke up in the night to go to the restroom and while walking back suddenly lost her balance and fell on the floor. The patient was found to have a left femoral neck fracture. Patient was transferred to [facility] for further management.  Assessment/Plan: Principal Problem:  Left Femoral neck fracture - Postop day #1, status post left hemiarthroplasty  - Pain management and DVT prophylaxis per orthopedic surgery   Active Problems: Anemia: Acute on chronic anemia - Transfuse 2 units of packed RBCs  Hypotension with tachycardia - Hold the beta blocker, IV fluid bolus - Decadron dose was held as family had confusion (per daughter, Cheryl Nielsen had told them 2/15 to stop Decadron and family was under the impression that steroids were weaned off). Explained to the daughter Cheryl Nielsen at the bedside, reviewed all the notes from her recent admission with the daughter, Cheryl Nielsen had recommended to continue the steroids, her dose was decreased to 4 mg twice a day. We will give 1 dose of IV hydrocortisone, continue Decadron, I will clarify if Decadron needs to be weaned off with Cheryl Nielsen.    Small cell lung cancer, Brain metastases - Continue Decadron, patient is following Cheryl Nielsen outpatient  Chronic  Hyponatremia -  Possibly due to brain metastasis, continue IV fluids    Protein-calorie malnutrition, severe - Placed a nutrition consult  DVT Prophylaxis: Lovenox  Code Status: DO NOT RESUSCITATE  Family Communication: Discussed with patient's daughter, Cheryl Nielsen in detail at the bedside  Disposition:  Consultants:  Orthopedics  Neurology  Procedures:  none  Antibiotics:  none    Subjective: Patient seen and examined, currently comfortable, complaining of hoarseness this morning. Pain controlled, daughter at the bedside  Objective: Weight change:   Intake/Output Summary (Last 24 hours) at 03/19/14 1054 Last data filed at 03/19/14 0500  Gross per 24 hour  Intake 6406.35 ml  Output    350 ml  Net 6056.35 ml   Blood pressure 91/61, pulse 133, temperature 98.2 F (36.8 C), temperature source Oral, resp. rate 13, height 5' 2.5" (1.588 m), weight 49.442 kg (109 lb), SpO2 100 %.  Physical Exam: General: Alert and awake, oriented x3, not in any acute distress, voice is somewhat hoarse. CVS: S1-S2 clear, no murmur rubs or gallops Chest: clear to auscultation bilaterally, no wheezing, rales or rhonchi Abdomen: soft nontender, nondistended, normal bowel sounds  Extremities: no cyanosis, clubbing or edema noted bilaterally Neuro: Cranial nerves II through XII intact, no focal neurological deficits noted. Hoarseness but no dysarthria or aphasia   Lab Results: Basic Metabolic Panel:  Recent Labs Lab 03/18/14 0142 03/18/14 2230 03/19/14 0610  NA 130*  --  130*  K 4.2  --  5.0  CL 94*  --  97  CO2 27  --  24  GLUCOSE 118*  --  120*  BUN 22  --  19  CREATININE 1.13* 1.19* 1.09  CALCIUM 9.0  --  8.0*   Liver Function Tests: No results for input(s): AST, ALT, ALKPHOS, BILITOT, PROT, ALBUMIN in the last 168 hours. No results for input(s): LIPASE, AMYLASE in the last 168 hours. No results for input(s): AMMONIA in the last 168 hours. CBC:  Recent Labs Lab 03/18/14 0142  03/18/14 2230 03/19/14 0610  WBC 12.9* 31.4* 13.2*  NEUTROABS 11.3*  --   --   HGB 12.4 8.4* 7.1*  HCT 37.2 24.8* 21.0*  MCV 91.4 91.2 90.5  PLT 265 180 147*   Cardiac Enzymes: No results for input(s): CKTOTAL, CKMB, CKMBINDEX, TROPONINI in the last 168 hours. BNP: Invalid input(s): POCBNP CBG: No results for input(s): GLUCAP in the last 168 hours.   Micro Results: Recent Results (from the past 240 hour(s))  Surgical pcr screen     Status: None   Collection Time: 03/18/14  8:50 PM  Result Value Ref Range Status   MRSA, PCR NEGATIVE NEGATIVE Final   Staphylococcus aureus NEGATIVE NEGATIVE Final    Comment:        The Xpert SA Assay (FDA approved for NASAL specimens in patients over 29 years of age), is one component of a comprehensive surveillance program.  Test performance has been validated by [facility] for patients greater than or equal to 60 year old. It is not intended to diagnose infection nor to guide or monitor treatment.     Studies/Results: Ct Abdomen Pelvis Wo Contrast  02/22/2014   CLINICAL DATA:  Current history of left-sided small-cell lung cancer.  EXAM: CT CHEST, ABDOMEN AND PELVIS WITHOUT CONTRAST  TECHNIQUE: Multidetector CT imaging of the chest, abdomen and pelvis was performed following the standard protocol without IV contrast.  COMPARISON:  CT scan of January 04, 2014.  FINDINGS: CT CHEST FINDINGS  No pneumothorax or significant pleural effusion is noted. Irregular mass density measuring 25 x 14 mm is noted laterally in the left upper lobe with adjacent 18 x 15 mm pleural-based abnormality. These are not significantly changed compared to prior exam. Stable 10 x 7 mm lingular nodule is noted. Stable scarring or post radiation changes are noted in the left upper lobe. Stable right apical scarring is noted. Stable small left lower lobe nodules are noted. No new nodules or masses are seen.  Anterior superior mediastinal adenopathy measuring 4.4 x 2.5 cm is  noted which is not significantly changed compared to prior exam. Right internal jugular Port-A-Cath is noted with distal tip at the cavoatrial junction. Minimal pericardial effusion is noted. Sclerotic densities are noted in the T8 and T9 vertebral bodies which are unchanged compared to prior exam and consistent with metastatic disease.  CT ABDOMEN AND PELVIS FINDINGS  Large right hepatic metastatic lesion is again noted measuring 6.0 x 5.5 cm in size, not significantly changed compared to prior exam. No gallstones are noted. Spleen and pancreas appear normal. Kidneys appear normal. No hydronephrosis or renal obstruction is noted. Stable bilateral adrenal masses are noted consistent with metastatic disease. Mild atherosclerotic calcifications of abdominal aorta are noted without aneurysm formation. The appendix appears normal. There is no evidence of bowel obstruction. No abnormal fluid collection is noted. Urinary bladder and uterus appear normal. Ovaries appear normal. No significant adenopathy is noted. No significant adenopathy is noted.  Stable sclerotic lesions are noted in the proximal right femur, right superior acetabulum, right sacrum and left acetabulum compared to prior exam consistent with metastatic disease.  IMPRESSION: Stable left upper  lobe lung masses are noted consistent with malignancy. Stable mediastinal adenopathy is noted. Stable lingular nodule is noted as well as several left lower lobe nodules.  Stable large right hepatic metastatic lesion.  Stable bilateral adrenal masses consistent with metastatic disease.  Stable osseous metastases as described above.   Electronically Signed   By: Sabino Dick M.D.   On: 02/22/2014 14:17   Dg Chest 2 View  03/01/2014   CLINICAL DATA:  Chest pain that started yesterday. Shortness of breath. Patient currently undergoing radiation therapy. History lung cancer.  EXAM: CHEST  2 VIEW  COMPARISON:  Chest CT - 02/22/2014  FINDINGS: Grossly unchanged cardiac  silhouette and mediastinal contours with left-sided volume loss and mild deviation of the cardiomediastinal structures to the left. Stable positioning of support apparatus. Grossly unchanged nodular heterogeneous airspace opacities within the left mid lung. The lungs remain hyperexpanded. No new focal airspace opacities. No pleural effusion or pneumothorax. No evidence of edema. No acute osseus abnormalities.  IMPRESSION: Grossly unchanged left mid lung heterogeneous opacities and volume loss compatible with provided history of lung cancer and undergoing radiation. No definite superimposed acute cardiopulmonary disease.   Electronically Signed   By: Sandi Mariscal M.D.   On: 03/01/2014 15:16   Ct Head Wo Contrast  03/01/2014   CLINICAL DATA:  74 year old with confusion. History of lung cancer with chest pain. History of intracranial metastatic disease.  EXAM: CT HEAD WITHOUT CONTRAST  TECHNIQUE: Contiguous axial images were obtained from the base of the skull through the vertex without contrast.  COMPARISON:  Brain MRI 02/12/2014  FINDINGS: There is diffuse low density throughout the periventricular and subcortical white matter which is similar to the prior examination. Slightly asymmetric cerebral atrophy. No evidence for acute hemorrhage, large mass lesion, midline shift, hydrocephalus or large new infarct. Visualized sinuses are clear.  Patient has known calvarial metastatic lesions. Again noted is a 1.5 cm lucent lesion in the occipital bone. There is also a subtle bone lesion near the left middle cranial fossa.  IMPRESSION: No acute intracranial abnormality.  Patient has known multiple small brain metastasis which are not well appreciated on this examination. Evidence for osseous metastatic lesions.  Diffuse white matter disease is similar to the previous examination.   Electronically Signed   By: Markus Daft M.D.   On: 03/01/2014 16:23   Ct Head W Wo Contrast  03/04/2014   CLINICAL DATA:  Weakness, change in  behavior. History of metastatic lung cancer with treated brain metastasis.  EXAM: CT HEAD WITHOUT AND WITH CONTRAST  TECHNIQUE: Contiguous axial images were obtained from the base of the skull through the vertex without and with intravenous contrast  CONTRAST:  31mL OMNIPAQUE IOHEXOL 300 MG/ML  SOLN  COMPARISON:  CT of the head March 01, 2014 and MRI of the head October 08, 2013  FINDINGS: Moderate ventriculomegaly, likely on the basis of global parenchymal brain volume loss as there is overall commensurate enlargement of cerebral sulci and cerebellar folia, unchanged. No intraparenchymal hemorrhage, mass effect, midline shift. Confluent supratentorial white matter hypodensities, similar to prior CT. No acute large vascular territory infarct. Faint enhancing 4 mm lesion in RIGHT frontal gray-white matter junction, axial 13 of 31, and appears new from prior MRI. Patient's known treated metastasis are difficult to discern on CT.  No abnormal extra-axial fluid collections or extra-axial enhancement, no definite extra-axial masses.  No paranasal sinus air-fluid levels. Mastoid air cells are well aerated. No skull fracture. Again noted is the lytic lesion  within paracentral occipital calvarium. Ocular globes and orbital contents are nonsuspicious.  IMPRESSION: Enhancing 4 mm RIGHT frontal lobe lesion, concerning for new metastasis though, MRI of the brain with contrast would be more sensitive. Re- demonstration of osseous metastasis.  Moderate global parenchymal brain volume loss. Severe white matter changes, similar to prior examination may reflect sequelae of prior radiation.   Electronically Signed   By: Elon Alas   On: 03/04/2014 00:14   Ct Chest Wo Contrast  02/22/2014   CLINICAL DATA:  Current history of left-sided small-cell lung cancer.  EXAM: CT CHEST, ABDOMEN AND PELVIS WITHOUT CONTRAST  TECHNIQUE: Multidetector CT imaging of the chest, abdomen and pelvis was performed following the standard  protocol without IV contrast.  COMPARISON:  CT scan of January 04, 2014.  FINDINGS: CT CHEST FINDINGS  No pneumothorax or significant pleural effusion is noted. Irregular mass density measuring 25 x 14 mm is noted laterally in the left upper lobe with adjacent 18 x 15 mm pleural-based abnormality. These are not significantly changed compared to prior exam. Stable 10 x 7 mm lingular nodule is noted. Stable scarring or post radiation changes are noted in the left upper lobe. Stable right apical scarring is noted. Stable small left lower lobe nodules are noted. No new nodules or masses are seen.  Anterior superior mediastinal adenopathy measuring 4.4 x 2.5 cm is noted which is not significantly changed compared to prior exam. Right internal jugular Port-A-Cath is noted with distal tip at the cavoatrial junction. Minimal pericardial effusion is noted. Sclerotic densities are noted in the T8 and T9 vertebral bodies which are unchanged compared to prior exam and consistent with metastatic disease.  CT ABDOMEN AND PELVIS FINDINGS  Large right hepatic metastatic lesion is again noted measuring 6.0 x 5.5 cm in size, not significantly changed compared to prior exam. No gallstones are noted. Spleen and pancreas appear normal. Kidneys appear normal. No hydronephrosis or renal obstruction is noted. Stable bilateral adrenal masses are noted consistent with metastatic disease. Mild atherosclerotic calcifications of abdominal aorta are noted without aneurysm formation. The appendix appears normal. There is no evidence of bowel obstruction. No abnormal fluid collection is noted. Urinary bladder and uterus appear normal. Ovaries appear normal. No significant adenopathy is noted. No significant adenopathy is noted.  Stable sclerotic lesions are noted in the proximal right femur, right superior acetabulum, right sacrum and left acetabulum compared to prior exam consistent with metastatic disease.  IMPRESSION: Stable left upper lobe  lung masses are noted consistent with malignancy. Stable mediastinal adenopathy is noted. Stable lingular nodule is noted as well as several left lower lobe nodules.  Stable large right hepatic metastatic lesion.  Stable bilateral adrenal masses consistent with metastatic disease.  Stable osseous metastases as described above.   Electronically Signed   By: Sabino Dick M.D.   On: 02/22/2014 14:17   [name] Contrast  03/04/2014   CLINICAL DATA:  74 year old female with metastatic lung cancer status post radiation therapy. Restaging. Subsequent encounter.  EXAM: MRI HEAD WITH CONTRAST  TECHNIQUE: Multiplanar, multiecho pulse sequences of the brain and surrounding structures were obtained with intravenous contrast.  COMPARISON:  Head CT 01/01/2015.  Brain MRI 02/12/2014.  CONTRAST:  17mL MULTIHANCE GADOBENATE DIMEGLUMINE 529 MG/ML IV SOLN  FINDINGS: Today's study a 1.5 Tesla using affect her post-contrast images than the 3 Tesla study on 07/08/2014.  Widely scattered small brain metastases re- identified. No definite new or progressed brain metastasis identified. Several of the smallest metastases  identified previously may have mildly regressed column a or may be less visible due to technical factors on today's study.  No associated intracranial mass affect. Confluent cerebral white matter T2 and FLAIR hyperintensity suggesting sequelae of prior whole brain radiation is stable. No new or increased cerebral edema.  Osseous metastatic disease to the skull not significantly changed. No dural thickening or hyper enhancement identified.  There is a punctate focus of restricted diffusion without enhancement at the anterior inferior left basal ganglia seen on series 5 image 21 and series 6, image 26. There is mild associated T2 and FLAIR hyperintensity which is new. Series 8, image 11. No other diffusion abnormality.  Major intracranial vascular flow voids are stable. No ventriculomegaly. Stable small micro hemorrhage at  the right vertex. No acute intracranial hemorrhage identified. Negative pituitary. Negative visualized spinal cord. Orbits, paranasal sinuses, and visualized internal auditory structures appear stable. Visualized scalp soft tissues are within normal limits.  IMPRESSION: 1. Punctate nonenhancing focus of restricted diffusion in the inferior left basal ganglia, presumed acute lacunar infarct. No mass effect or hemorrhage. 2. No new or progressive brain metastasis identified. Stable or slight regression of metastatic Brain disease since 02/12/2014 allowing for technical differences between this exam and the prior. 3. No significant change in osseous metastatic disease to the skull.   Electronically Signed   By: Genevie Ann M.D.   On: 03/04/2014 14:50   Pelvis Portable  03/18/2014   CLINICAL DATA:  Status post left hip fracture  EXAM: PORTABLE PELVIS 1-2 VIEWS  COMPARISON:  None.  FINDINGS: Left unipolar hip arthroplasty.  No fracture or dislocation.  IMPRESSION: No complication following left hip arthroplasty.   Electronically Signed   By: Suzy Bouchard M.D.   On: 03/18/2014 19:00   Dg Chest Port 1 View  03/18/2014   CLINICAL DATA:  Status post fall; concern for chest injury. Initial encounter.  EXAM: PORTABLE CHEST - 1 VIEW  COMPARISON:  Chest radiograph performed 03/07/2014  FINDINGS: Vascular congestion has mildly improved from the prior study. Mild bilateral atelectasis is noted, with persistent elevation of the left hemidiaphragm. Left upper lobe opacity is grossly stable in appearance, reflecting the patient's known malignancy, with left-sided volume loss. No pleural effusion or pneumothorax is seen. Additional left-sided pulmonary nodules are better characterized on recent CT.  The cardiomediastinal silhouette is mildly enlarged. A right-sided chest port is noted ending about the distal SVC. No acute osseous abnormalities are identified.  IMPRESSION: 1. No displaced rib fracture seen. 2. Vascular congestion  and mild cardiomegaly. Mild bilateral atelectasis, with persistent elevation of the left hemidiaphragm. Stable appearance to left upper lobe opacity, reflecting the patient's known malignancy, with left-sided volume loss.   Electronically Signed   By: Garald Balding M.D.   On: 03/18/2014 02:28   Dg Chest Port 1 View  03/07/2014   CLINICAL DATA:  Dyspnea.  Left-sided lung cancer.  EXAM: PORTABLE CHEST - 1 VIEW  COMPARISON:  03/01/2014  FINDINGS: Right sided PowerPort tip overlies the level of superior vena cava. Heart size is enlarged. There is left upper lobe opacity unchanged compared with prior studies. There is left-sided volume loss. Heart size normal. There are no new consolidations or pleural effusions. Stable bronchitic changes.  IMPRESSION: 1. Stable appearance of left upper lobe opacity and left lung volume loss. 2. Stable bronchitic changes.   Electronically Signed   By: Nolon Nations M.D.   On: 03/07/2014 12:02   Ct Angio Chest Aorta W/cm &/or Wo/cm  03/01/2014   CLINICAL DATA:  Shortness of breath with chest pain and diaphoresis common known history of carcinoma lung  EXAM: CT ANGIOGRAPHY CHEST WITH CONTRAST  TECHNIQUE: Multidetector CT imaging of the chest was performed using the standard protocol during bolus administration of intravenous contrast. Multiplanar CT image reconstructions and MIPs were obtained to evaluate the vascular anatomy.  CONTRAST:  136mL OMNIPAQUE IOHEXOL 350 MG/ML SOLN  COMPARISON:  02/22/2014  FINDINGS: The lungs are well aerated bilaterally. Scarring is again noted in the right upper lobe. No other significant changes noted within the right lung. There are changes in the left upper lobe consistent with the given clinical history of lung carcinoma. The overall appearance is stable from the recent exam. A few small nodules are noted in the left lower lobe also stable from the prior exam. No sizable effusion or pneumothorax is noted.  Considerable lymphadenopathy is noted  encasing the vascular structures just above the aortic arch. These changes are stable in appearance from the prior study. No significant hilar adenopathy is noted. The thoracic aorta shows no findings to suggest dissection or aneurysmal dilatation. Minimal atherosclerotic calcifications are seen. Although not optimized for pulmonary emboli evaluation no large central embolus is seen.  The upper abdomen there again noted changes consistent with hepatic metastatic disease. The dominant lesion in the right lobe is stable. There is suggestion of a smaller lesion in the lateral segment of left lobe of the liver posteriorly. Enlargement of the adrenal glands is noted bilaterally consistent with metastatic disease. Additionally large retrocrural lymph nodes are seen also consistent with localized metastatic disease. Sclerotic foci are again noted in the T8 and T9 vertebral bodies consistent with metastatic disease. Similar findings are noted in the L1, L3 and L4 vertebral bodies.  Review of the MIP images confirms the above findings.  IMPRESSION: No evidence of thoracic aortic dissection. No definitive pulmonary embolism is seen.  Changes consistent with the known history of left upper lobe lung carcinoma with metastatic disease to the mediastinal and retrocrural lymph nodes as well as multiple thoracic and lumbar vertebra, adrenal glands and liver. Multiple small nodules are again noted in the left lower lobe.  No acute abnormality is noted.   Electronically Signed   By: Inez Catalina M.D.   On: 03/01/2014 16:27   Dg Hip Unilat With Pelvis 2-3 Views Left  03/18/2014   CLINICAL DATA:  Status post fall onto hard tile floor. Left hip pain. Initial encounter.  EXAM: LEFT HIP (WITH PELVIS) 2-3 VIEWS  COMPARISON:  None.  FINDINGS: There appears to be a basicervical fracture through the left femoral neck, with mild rotation of the distal femur. The left femoral head remains seated at the acetabulum. The right hip joint is  unremarkable. No significant degenerative change is appreciated. The sacroiliac joints are unremarkable in appearance.  The visualized bowel gas pattern is grossly unremarkable in appearance. Scattered phleboliths are noted within the pelvis.  IMPRESSION: Basicervical fracture through the left femoral neck, with mild rotation of the distal femur.   Electronically Signed   By: Garald Balding M.D.   On: 03/18/2014 02:25    Medications: Scheduled Meds: . sodium chloride   Intravenous Once  . dexamethasone  4 mg Oral BID  . docusate sodium  100 mg Oral BID  . enoxaparin (LOVENOX) injection  40 mg Subcutaneous Q24H  . hydrocortisone sod succinate (SOLU-CORTEF) inj  100 mg Intravenous Once  . sodium chloride  1,000 mL Intravenous Once   Time  spent 25 minutes   LOS: 1 day   Shellyann Wandrey M.D. [facility] 03/19/2014, 10:54 AM Pager: [phone]  If 7PM-7AM, please contact night-coverage [email or web address] Password [email or web address]

## 2014-03-19 NOTE — Progress Notes (Signed)
IV infiltrated, patient requested that her port-a-cath be accessed.  Dr. Tana Coast contacted; telephone order with read back to use port-a-cath.  Per Dr. Tana Coast, port-a-cath can be accessed by IV team using topical anesthetic.

## 2014-03-19 NOTE — Progress Notes (Addendum)
Patient was screened by Gerlean Ren for appropriateness for an Inpatient Acute Rehab consult.  At this time, we are recommending Inpatient Rehab consult.  Please order when you feel appropriate.   [facility] Admissions Coordinator Cell [phone] Office [phone]  1620 Update:   I discussed case with Levora Dredge, case manager for  [employer] regarding the fact that pt. Came from a SNF and will need to return to SNF due to her medical issues (pt. Is a bundled medicare patient).   I therefore am no longer recommending IP Rehab consult.  I will update Dr. Tana Coast of this change.  Please call if questions.  [place] Admissions Coordinator

## 2014-03-19 NOTE — Evaluation (Addendum)
Occupational Therapy Evaluation Patient Details Name: Cheryl Nielsen MRN: [id number] DOB: [birth year] Today's Date: 03/19/2014    History of Present Illness LEFT HEMI-ARTHROPLASTY HIP secondary to recent fall.   Clinical Impression   Patient overall supervision > min assist PTA. Patient currently functioning at an overall min>total assist level for BADLs. Patient will benefit from acute OT to increase overall independence in the areas of ADLs, functional mobility, and overall safety in order to safely discharge to CIR for comprehensive rehabilitation.     Follow Up Recommendations  Supervision/Assistance - 24 hour;CIR    Equipment Recommendations   (TBD)    Recommendations for Other Services   None at this time     Precautions / Restrictions Precautions Precautions: Fall Precaution Comments: Hx of falls Restrictions Weight Bearing Restrictions: Yes LLE Weight Bearing: Touchdown weight bearing      Mobility Bed Mobility Overal bed mobility: Needs Assistance Bed Mobility: Rolling;Sidelying to Sit;Sit to Supine Rolling: Min assist Sidelying to sit: Min assist   Sit to supine: Min assist   General bed mobility comments: Patient required assistance with LLE management   Transfers General transfer comment: No transfer performed secondary to decreased activity tolerance/endurance     Balance Overall balance assessment: Needs assistance Sitting-balance support: No upper extremity supported;Feet supported Sitting balance-Leahy Scale: Fair    ADL Overall ADL's : Needs assistance/impaired     Grooming: Set up;Sitting   Upper Body Bathing: Set up;Sitting;Supervision/ safety   Lower Body Bathing: Total assistance   Upper Body Dressing : Set up;Supervision/safety;Sitting   Lower Body Dressing: Total assistance   General ADL Comments: Patient unable to cross legs for LB ADLs. Patient lethargic and with poor overall activity tolerance/endurance. Patient with decreased  oxygen support and therapist encouraged pursed lip breathing when sats lower than 90%. Patient sat EOB and unable to stand due to decreased tolerance.      Vision Additional Comments: Vision to be further tested in functional setting          Pertinent Vitals/Pain Pain Assessment: No/denies pain     Hand Dominance Right   Extremity/Trunk Assessment Upper Extremity Assessment Upper Extremity Assessment: Generalized weakness   Lower Extremity Assessment Lower Extremity Assessment: Defer to PT evaluation       Communication Communication Communication: No difficulties   Cognition Arousal/Alertness: Awake/alert Behavior During Therapy: WFL for tasks assessed/performed Overall Cognitive Status: Impaired/Different from baseline Area of Impairment: Memory;Problem solving;Safety/judgement;Following commands       Following Commands: Follows one step commands consistently Safety/Judgement: Decreased awareness of safety;Decreased awareness of deficits   Problem Solving: Slow processing;Requires verbal cues General Comments: Family reports patient had a stroke ~2 weeks ago, since stroke decrease in cognition              Home Living Family/patient expects to be discharged to:: Private residence Living Arrangements: Alone Available Help at Discharge: Family;Available 24 hours/day Type of Home: House Home Access: Stairs to enter [employer] of Steps: 6   Home Layout: Two level;Bed/bath upstairs Alternate Level Stairs-Number of Steps: flight Alternate Level Stairs-Rails: Left Bathroom Shower/Tub: Tub/shower unit;Curtain   Bathroom Toilet: Standard     Home Equipment: Environmental consultant - 2 wheels;Shower seat    Prior Functioning/Environment Level of Independence: Needs assistance  Gait / Transfers Assistance Needed: Used RW with assistance ADL's / Homemaking Assistance Needed: Required assistance for bathing & dressing tasks     OT Diagnosis: Generalized  weakness   OT Problem List: Decreased strength;Decreased activity tolerance;Impaired balance (sitting and/or standing);Impaired  vision/perception;Decreased cognition;Decreased safety awareness;Pain;Decreased knowledge of precautions   OT Treatment/Interventions: Self-care/ADL training;DME and/or AE instruction;Patient/family education;Cognitive remediation/compensation;Visual/perceptual remediation/compensation;Therapeutic activities;Balance training;Energy conservation    OT Goals(Current goals can be found in the care plan section) Acute Rehab OT Goals Patient Stated Goal: Daughter stated she wants pt to go to rehab here OT Goal Formulation: With patient/family Time For Goal Achievement: 04/02/14 Potential to Achieve Goals: Good ADL Goals Pt Will Perform Grooming: standing;with min guard assist Pt Will Perform Upper Body Bathing: Independently;sitting Pt Will Perform Lower Body Bathing: with min assist;sit to/from stand;with adaptive equipment Pt Will Perform Upper Body Dressing: Independently;sitting Pt Will Perform Lower Body Dressing: with min assist;with adaptive equipment;sit to/from stand Pt Will Transfer to Toilet: with min assist;ambulating;bedside commode Pt Will Perform Tub/Shower Transfer: with min assist;rolling walker;ambulating;tub bench  OT Frequency: Min 2X/week   Barriers to D/C: Inaccessible home environment          End of Session Nurse Communication: Other (comment) (elevated HR during activity)  Activity Tolerance: Patient limited by lethargy Patient left: in bed;with call bell/phone within reach;with family/visitor present   Time: 7972-8206 OT Time Calculation (min): 26 min Charges:  OT General Charges $OT Visit: 1 Procedure OT Evaluation $Initial OT Evaluation Tier I: 1 Procedure OT Treatments $Therapeutic Activity: 8-22 mins  Tehya Leath , MS, OTR/L, CLT Pager: [phone]  03/19/2014, 10:08 AM

## 2014-03-19 NOTE — Progress Notes (Signed)
INITIAL NUTRITION ASSESSMENT  Pt meets criteria for SEVERE MALNUTRITION in the context of chronic illness as evidenced by severe fat and muscle mass loss.  DOCUMENTATION CODES Per approved criteria  -Severe malnutrition in the context of chronic illness   INTERVENTION: Provide Resource Breeze po TID, each supplement provides 250 kcal and 9 grams of protein.  Encourage adequate PO intake.  NUTRITION DIAGNOSIS: Increased nutrient needs related to cancer and s/p surgery as evidenced by estimated nutrition needs.   Goal: Pt to meet >/= 90% of their estimated nutrition needs   Monitor:  PO intake, weight trends, labs, I/O's  Reason for Assessment: MD consult for poor po intake  74 y.o. 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 Patient was found to have a left femoral neck fracture.   Procedure(2/29) and Anesthesia Type: LEFT HEMI-ARTHROPLASTY HIP - General  Pt is currently on a clear liquid diet. No percent meal completion recorded. Pt reports her appetite was fine PTA eating 3 meals a day and consuming Ensure 1-2 times daily. Pt reports her weight has been around 110 lbs. Daughter present at bedside. Pt is agreeable to Resource Breeze to aid in caloric and protein needs. RD to order. Once diet advances, RD to order Ensure. Pt was encouraged to consumer her food at meals and to drink her supplements.  Nutrition Focused Physical Exam:  Subcutaneous Fat:  Orbital Region: N/A Upper Arm Region: Severe depletion Thoracic and Lumbar Region: WNL  Muscle:  Temple Region: N/A Clavicle Bone Region: Moderate depletion Clavicle and Acromion Bone Region: Moderate depletion Scapular Bone Region: N/A Dorsal Hand: N/A Patellar Region: Moderate to severe depletion Anterior Thigh  Region: Severe depletion Posterior Calf Region: Moderate depletion  Edema: none  Labs: Low sodium, calcium, and GFR.  Height: Ht Readings from Last 1 Encounters:  03/18/14 5' 2.5" (1.588 m)    Weight: Wt Readings from Last 1 Encounters:  03/18/14 109 lb (49.442 kg)    Ideal Body Weight: 113 lbs  % Ideal Body Weight: 96%  Wt Readings from Last 10 Encounters:  03/18/14 109 lb (49.442 kg)  03/04/14 105 lb 11.2 oz (47.945 kg)  03/04/14 105 lb (47.628 kg)  02/25/14 109 lb 4.8 oz (49.578 kg)  02/13/14 108 lb (48.988 kg)  02/12/14 106 lb (48.081 kg)  02/06/14 111 lb 6.4 oz (50.531 kg)  01/22/14 111 lb 3.2 oz (50.44 kg)  01/08/14 111 lb 3.2 oz (50.44 kg)  12/18/13 112 lb 11.2 oz (51.12 kg)    Usual Body Weight: 110 lbs  % Usual Body Weight: 99%  BMI:  Body mass index is 19.61 kg/(m^2).  Estimated Nutritional Needs: Kcal: 1750-2000 Protein: 75-95 grams Fluid: 1.75 - 2 L/day  Skin: Incision left hip  Diet Order: Diet clear liquid  EDUCATION NEEDS: -No education needs identified at this time   Intake/Output Summary (Last 24 hours) at 03/19/14 1107 Last data filed at 03/19/14 0500  Gross per 24 hour  Intake 6406.35 ml  Output    350 ml  Net 6056.35 ml    Last BM: 2/29  Labs:   Recent Labs Lab 03/18/14 0142 03/18/14 2230 03/19/14 0610  NA 130*  --  130*  K 4.2  --  5.0  CL 94*  --  97  CO2 27  --  24  BUN 22  --  19  CREATININE 1.13* 1.19* 1.09  CALCIUM 9.0  --  8.0*  GLUCOSE 118*  --  120*    CBG (last 3)  No results for input(s): GLUCAP in the last 72 hours.  Scheduled Meds: . sodium chloride   Intravenous Once  .  ceFAZolin (ANCEF) IV  2 g Intravenous Q6H  . dexamethasone  4 mg Oral BID  . docusate sodium  100 mg Oral BID  . enoxaparin (LOVENOX) injection  40 mg Subcutaneous Q24H  . hydrocortisone sod succinate (SOLU-CORTEF) inj  50 mg Intravenous Once  . sodium chloride  1,000 mL Intravenous Once    Continuous Infusions: . sodium  chloride 75 mL/hr at 03/19/14 0316    Past Medical History  Diagnosis Date  . Pneumonia, organism unspecified   . Status post chemotherapy  10/11/2012     carboplatin  . S/P radiation therapy 08/24/2012-09/11/2012    Whole Brain  / 32.5 Gy in 13 fractions  . 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 S/P radiation therapy  07/24/2012-08/10/2012      Mediastinum and Left Hilum / 35 Gy in 14 fractions    . Hypertension     Past Surgical History  Procedure Laterality Date  . Nasal sinus surgery    . Video bronchoscopy Bilateral 07/05/2012    Procedure: VIDEO BRONCHOSCOPY WITHOUT FLUORO;  Surgeon: Tanda Rockers, MD;  Location: [facility];  Service: Cardiopulmonary;  Laterality: Bilateral;  . Portacath placement      Kallie Locks, MS, RD, LDN Pager # [phone] After hours/ weekend pager # [phone]

## 2014-03-19 NOTE — Clinical Social Work Placement (Addendum)
Clinical Social Work Department CLINICAL SOCIAL WORK PLACEMENT NOTE 03/19/2014  Patient:  Cheryl Nielsen, Cheryl Nielsen  Account Number:  [id number] Admit date:  03/18/2014  Clinical Social Worker:  Durward Fortes, CLINICAL SOCIAL WORKER  Date/time:  03/19/2014 11:00 AM  Clinical Social Work is seeking post-discharge placement for this patient at the following level of care:   [place]   (*CSW will update this form in Epic as items are completed)   03/19/2014  Patient/family provided with [facility] Department of Clinical Social Work's list of facilities offering this level of care within the geographic area requested by the patient (or if unable, by the patient's family).  03/19/2014  Patient/family informed of their freedom to choose among providers that offer the needed level of care, that participate in Medicare, Medicaid or managed care program needed by the patient, have an available bed and are willing to accept the patient.  03/19/2014  Patient/family informed of [facility]' ownership interest in [facility], as well as of the fact that they are under no obligation to receive care at this facility.  PASARR submitted to EDS on 03/19/2014 PASARR number received on 03/19/2014  FL2 transmitted to all facilities in geographic area requested by pt/family on  03/19/2014 FL2 transmitted to all facilities within larger geographic area on   Patient informed that his/her managed care company has contracts with or will negotiate with  certain facilities, including the following:     Patient/family informed of bed offers received:  03/20/2014 Patient chooses bed at [facility] Physician recommends and patient chooses bed at    Patient to be transferred to [place]  on  03/22/2014 Patient to be transferred to facility by [facility] Patient and family notified of transfer on 03/22/2014 Name of family member notified:  [name]  The following physician request  were entered in Epic:   Additional Comments:  Kierra S. Wiley, BSW-Intern

## 2014-03-19 NOTE — Progress Notes (Signed)
Physical Therapy Evaluation Patient Details Name: Cheryl Nielsen MRN: [id number] DOB: July 02, 1940 Today's Date: 03/19/2014   History of Present Illness  LEFT HEMI-ARTHROPLASTY HIP secondary to recent fall. Hx of lung Ca, undergoing chemo, brain mets; recent CVA  Past Medical History  Diagnosis Date  . Pneumonia, organism unspecified   . Status post chemotherapy  10/11/2012     carboplatin  . S/P radiation therapy 08/24/2012-09/11/2012    Whole Brain  / 32.5 Gy in 13 fractions  . 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 S/P radiation therapy  07/24/2012-08/10/2012      Mediastinum and Left Hilum / 35 Gy in 14 fractions    . Hypertension    Past Surgical History  Procedure Laterality Date  . Nasal sinus surgery    . Video bronchoscopy Bilateral 07/05/2012    Procedure: VIDEO BRONCHOSCOPY WITHOUT FLUORO;  Surgeon: Tanda Rockers, MD;  Location: [facility];  Service: Cardiopulmonary;  Laterality: Bilateral;  . Portacath placement       Clinical Impression  Patient is s/p above surgery resulting in functional limitations due to the deficits listed below (see PT Problem List).  Patient will benefit from skilled PT to increase their independence and safety with mobility to allow discharge to the venue listed below.       Follow Up Recommendations CIR    Equipment Recommendations  Rolling walker with 5" wheels;3in1 (PT)    Recommendations for Other Services OT consult     Precautions / Restrictions Precautions Precautions: Fall;Posterior Hip Precaution Booklet Issued: Yes (comment) Precaution Comments: Hx of falls Restrictions Weight Bearing Restrictions: Yes LLE Weight Bearing: Touchdown weight bearing      Mobility  Bed Mobility Overal bed mobility: Needs Assistance Bed Mobility: Supine to Sit Rolling: Min assist Sidelying to sit: Min assist Supine to sit: Mod assist;+2 for safety/equipment Sit to supine: Min assist   General bed mobility  comments: Patient required assistance with LLE management; provided support to trunk with transition to sit as well  Transfers Overall transfer level: Needs assistance Equipment used: 2 person hand held assist Transfers: Stand Pivot Transfers   Stand pivot transfers: +2 safety/equipment;Mod assist       General transfer comment: Pt wanting to get OOB, so performed a gentle stand pivot transfer towards R side with suport bilaterally at gait belt and UEs; noted good rise on RLE  Ambulation/Gait             General Gait Details: Held attempting steps due to tachycardia  Stairs            Wheelchair Mobility    Modified Rankin (Stroke Patients Only)       Balance Overall balance assessment: Needs assistance Sitting-balance support: Bilateral upper extremity supported Sitting balance-Leahy Scale: Fair                                       Pertinent Vitals/Pain Pain Assessment: 0-10 Pain Score: 5  Pain Location: L hip with transition to sitting Pain Descriptors / Indicators: Grimacing;Discomfort Pain Intervention(s): Monitored during session;Repositioned    Home Living Family/patient expects to be discharged to:: Private residence Living Arrangements: Alone Available Help at Discharge: Family;Available 24 hours/day Type of Home: House Home Access: Stairs to enter Entrance Stairs-Rails: Right Entrance Stairs-Number of Steps: 6 Home Layout: Two level;Bed/bath upstairs Home Equipment: Walker - 2 wheels;Shower seat Additional  Comments: pt was living alone prior to most recent admission in mid Feb; admitted from SNF this admission    Prior Function Level of Independence: Needs assistance   Gait / Transfers Assistance Needed: Used RW with assistance  ADL's / Homemaking Assistance Needed: Required assistance for bathing & dressing tasks        Hand Dominance   Dominant Hand: Right    Extremity/Trunk Assessment   Upper Extremity  Assessment: Defer to OT evaluation           Lower Extremity Assessment: LLE deficits/detail   LLE Deficits / Details: Grossly decr AROM and strength, limited by pain postop     Communication   Communication: No difficulties  Cognition Arousal/Alertness: Lethargic;Suspect due to medications Behavior During Therapy: [facility] for tasks assessed/performed Overall Cognitive Status: Impaired/Different from baseline Area of Impairment: Memory;Problem solving;Safety/judgement;Following commands       Following Commands: Follows one step commands consistently Safety/Judgement: Decreased awareness of safety;Decreased awareness of deficits   Problem Solving: Slow processing;Requires verbal cues General Comments: Family reports patient had a stroke ~2 weeks ago, since stroke decrease in cognition    General Comments General comments (skin integrity, edema, etc.): Tachycardic during session, HR range 131-143; O2 sats remained greater than or equal to 91%; Pt with low-pitched wheeze/snore sounding respirations; started O2 1L for comfort and notified RN    Exercises        Assessment/Plan    PT Assessment Patient needs continued PT services  PT Diagnosis Difficulty walking;Generalized weakness;Acute pain   PT Problem List Decreased strength;Decreased activity tolerance;Decreased balance;Decreased mobility;Decreased knowledge of use of DME;Pain;Decreased safety awareness;Decreased knowledge of precautions  PT Treatment Interventions DME instruction;Gait training;Functional mobility training;Therapeutic activities;Patient/family education;Balance training   PT Goals (Current goals can be found in the Care Plan section) Acute Rehab PT Goals Patient Stated Goal: Daughter stated she wants pt to go to rehab here PT Goal Formulation: With patient Time For Goal Achievement: 04/02/14 Potential to Achieve Goals: Good    Frequency Min 5X/week   Barriers to discharge        Co-evaluation                End of Session Equipment Utilized During Treatment: Gait belt Activity Tolerance: Patient limited by fatigue Patient left: in chair;with call bell/phone within reach Nurse Communication: Mobility status         Time: 6004-5997 PT Time Calculation (min) (ACUTE ONLY): 15 min   Charges:   PT Evaluation $Initial PT Evaluation Tier I: 1 Procedure     PT G CodesRoney Marion Hamff 03/19/2014, 11:17 AM  Roney Marion, PT  Acute Rehabilitation Services Pager [phone] Office [phone]

## 2014-03-19 NOTE — Progress Notes (Signed)
   PATIENT ID: Cheryl Nielsen   1 Day Post-Op Procedure(s) (LRB): LEFT HEMI-ARTHROPLASTY HIP (Left)  Subjective: no complaints, no pain at all at this point in her hip. No chest pain dizziness or shortness of breath.  Objective:  Filed Vitals:   03/19/14 0544  BP: 91/61  Pulse: 133  Temp: 98.2 F (36.8 C)  Resp:      Awake and alert.  Left hip dressing clean dry and intact. Intact DF/PF ankle ,toes. KI intact.  Labs:   Recent Labs  03/18/14 0142 03/18/14 2230 03/19/14 0610  HGB 12.4 8.4* 7.1*   Recent Labs  03/18/14 2230 03/19/14 0610  WBC 31.4* 13.2*  RBC 2.72* 2.32*  HCT 24.8* 21.0*  PLT 180 147*   Recent Labs  03/18/14 0142 03/18/14 2230  NA 130*  --   K 4.2  --   CL 94*  --   CO2 27  --   BUN 22  --   CREATININE 1.13* 1.19*  GLUCOSE 118*  --   CALCIUM 9.0  --     Assessment and Plan:POD1 s/p left hip hemi TDWB LLE ABLA: asymptomatic, recheck in am   VTE proph: lovenox 40 qd

## 2014-03-19 NOTE — Progress Notes (Signed)
Patient would like foley to remain until after first PT evaluation this morning.  In regards to the infiltrated iv, patient would like her port a cath accessed instead of receiving another iv.  Will have on coming shift to follow up.

## 2014-03-20 ENCOUNTER — Encounter: Payer: Self-pay | Admitting: Internal Medicine

## 2014-03-20 ENCOUNTER — Ambulatory Visit: Payer: Medicare Other

## 2014-03-20 LAB — CBC
HCT: 24.2 % — ABNORMAL LOW (ref 36.0–46.0)
Hemoglobin: 8.5 g/dL — ABNORMAL LOW (ref 12.0–15.0)
MCH: 30.1 pg (ref 26.0–34.0)
MCHC: 35.1 g/dL (ref 30.0–36.0)
MCV: 85.8 fL (ref 78.0–100.0)
PLATELETS: 121 10*3/uL — AB (ref 150–400)
RBC: 2.82 MIL/uL — ABNORMAL LOW (ref 3.87–5.11)
RDW: 16.8 % — ABNORMAL HIGH (ref 11.5–15.5)
WBC: 9.3 10*3/uL (ref 4.0–10.5)

## 2014-03-20 LAB — BASIC METABOLIC PANEL
ANION GAP: 6 (ref 5–15)
BUN: 14 mg/dL (ref 6–23)
CHLORIDE: 99 mmol/L (ref 96–112)
CO2: 23 mmol/L (ref 19–32)
CREATININE: 0.83 mg/dL (ref 0.50–1.10)
Calcium: 7.9 mg/dL — ABNORMAL LOW (ref 8.4–10.5)
GFR calc Af Amer: 79 mL/min — ABNORMAL LOW (ref >90)
GFR calc non Af Amer: 68 mL/min — ABNORMAL LOW (ref >90)
Glucose, Bld: 88 mg/dL (ref 70–99)
POTASSIUM: 3.5 mmol/L (ref 3.5–5.1)
Sodium: 128 mmol/L — ABNORMAL LOW (ref 135–145)

## 2014-03-20 MED ORDER — ENOXAPARIN SODIUM 40 MG/0.4ML ~~LOC~~ SOLN: 40.0000 mg | SUBCUTANEOUS | Status: AC

## 2014-03-20 MED ORDER — HYDROCODONE-ACETAMINOPHEN 5-325 MG PO TABS: 1.0000 | ORAL_TABLET | Freq: Four times a day (QID) | ORAL | Status: DC | PRN

## 2014-03-20 MED ORDER — ENSURE COMPLETE PO LIQD
237.0000 mL | Freq: Two times a day (BID) | ORAL | Status: DC
  Administered 2014-03-21: 237 mL via ORAL

## 2014-03-20 MED ORDER — DEXAMETHASONE 4 MG PO TABS
2.0000 mg | ORAL_TABLET | Freq: Two times a day (BID) | ORAL | Status: DC
  Administered 2014-03-20 – 2014-03-22 (×5): 2 mg via ORAL
  Filled 2014-03-20 (×5): qty 1

## 2014-03-20 MED ORDER — BOOST / RESOURCE BREEZE PO LIQD: 1.0000 | Freq: Every day | ORAL | Status: DC

## 2014-03-20 NOTE — Progress Notes (Signed)
   PATIENT ID: Andria Frames   2 Days Post-Op Procedure(s) (LRB): LEFT HEMI-ARTHROPLASTY HIP (Left)  Subjective: Doing well, minimal pain left hip. Transferred from bed to chair yesterday with PT. No other complaints or concerns.  Objective:  Filed Vitals:   03/20/14 0603  BP: 114/64  Pulse: 103  Temp: 98.4 F (36.9 C)  Resp: 12     Awake, alert, orientated Dressing saturated, changed today Incision benign Wiggles toes, distally NVI Knee immobilizer in place  Labs:   Recent Labs  03/18/14 0142 03/18/14 2230 03/19/14 0610 03/20/14 0531  HGB 12.4 8.4* 7.1* 8.5*   Recent Labs  03/19/14 0610 03/20/14 0531  WBC 13.2* 9.3  RBC 2.32* 2.82*  HCT 21.0* 24.2*  PLT 147* 121*   Recent Labs  03/19/14 0610 03/20/14 0531  NA 130* 128*  K 5.0 3.5  CL 97 99  CO2 24 23  BUN 19 14  CREATININE 1.09 0.83  GLUCOSE 120* 88  CALCIUM 8.0* 7.9*    Assessment and Plan: 1 day s/p left hip hemi Post hip precautions, cont knee immbolizer Touchdown weightbearing, up with PT today  DME walker, 3 in 1 ordered ABLA- expected, improving to 8.5 today, will continue to monitor D/c to SNF when medically stable per primary team Minimize narcotics, norco only if needed Continue lovenox daily x 3 weeks, script in chart for d/c  VTE proph: lovenox, SCDs

## 2014-03-20 NOTE — Clinical Social Work Note (Signed)
Clinical Social Worker met with patient and presented bed offers. CSW later discovered Bundling RNCM also met wit patient and spoke with pt's dtr, [name] HCPOA. Per Bundling RNCM, pt and pt's family chooses bed at [facility] and will complete admissions paperwork tomorrow. CSW confirmed discharge plans with facility.   CSW will continue to follow for continued support and to facilitate discharge plans once medically stable.   FL-2 on chart for MD signature.   Glendon Axe, MSW, LCSWA [phone] 03/20/2014 11:32 AM

## 2014-03-20 NOTE — Progress Notes (Signed)
Physical Therapy Treatment Patient Details Name: Cheryl Nielsen MRN: [id number] DOB: 03-02-1940 Today's Date: 03/20/2014    History of Present Illness LEFT HEMI-ARTHROPLASTY HIP secondary to recent fall. Hx of lung Ca, undergoing chemo, brain mets    PT Comments    Less tachycardic today , and pt able to tolerate much more activity, including gait training trial; Extremely difficult to maintain TWB LLE due to general upper body weakness; have added more PT goals aimed at manageable functional transfers, and updated equipment recommendations;    Follow Up Recommendations  SNF     Equipment Recommendations  Rolling walker with 5" wheels;3in1 (PT);Wheelchair (measurements PT);Wheelchair cushion (measurements PT);Other (comment);Hospital bed (drop-arm [facility])    Recommendations for Other Services       Precautions / Restrictions Precautions Precautions: Fall;Posterior Hip Precaution Booklet Issued: Yes (comment) Precaution Comments: Hx of falls Required Braces or Orthoses:  (KI in ORtho note, but not in order set) Restrictions Weight Bearing Restrictions: Yes LLE Weight Bearing: Touchdown weight bearing    Mobility  Bed Mobility                  Transfers Overall transfer level: Needs assistance Equipment used: Rolling walker (2 wheeled);2 person hand held assist Transfers: Sit to/from Stand;Stand Pivot Transfers Sit to Stand: +2 safety/equipment;+2 physical assistance;Mod assist Stand pivot transfers: +2 safety/equipment;Mod assist       General transfer comment: Cues for safety, hand placement, and technique with posterior hip prec; heavy moderate assist for lift and close guard to keep weight off of LLE  Ambulation/Gait Ambulation/Gait assistance: +2 safety/equipment;+2 physical assistance;Mod assist Ambulation Distance (Feet): 5 Feet Assistive device: Rolling walker (2 wheeled) Gait Pattern/deviations: Step-to pattern     General Gait Details: Cues for pushing  inot RW to keep TWB LLE during R stepping; pt had a lot of difficulty keeping TWB   Stairs            Wheelchair Mobility    Modified Rankin (Stroke Patients Only)       Balance             Standing balance-Leahy Scale: Poor                      Cognition Arousal/Alertness: Awake/alert Behavior During Therapy: WFL for tasks assessed/performed Overall Cognitive Status: Impaired/Different from baseline Area of Impairment: Memory;Problem solving;Safety/judgement;Following commands     Memory: Decreased short-term memory Following Commands: Follows one step commands consistently     Problem Solving: Slow processing;Requires verbal cues      Exercises      General Comments General comments (skin integrity, edema, etc.): HR 103 and O2 sats 100% during spot check in session; on Room Air      Pertinent Vitals/Pain Pain Assessment: Faces Faces Pain Scale: Hurts even more Pain Location: L hip with transitional movements Pain Descriptors / Indicators: Aching;Grimacing Pain Intervention(s): Monitored during session;Repositioned    Home Living                      Prior Function            PT Goals (current goals can now be found in the care plan section) Acute Rehab PT Goals Patient Stated Goal: wants to be able to do more tings for hersefl PT Goal Formulation: With patient Time For Goal Achievement: 04/02/14 Potential to Achieve Goals: Good Additional Goals Additional Goal #1: Pt will be able to give instructions to caregiver re: wheelchair  parts management Progress towards PT goals: Progressing toward goals (added wheelchair and bed<>chair transfer goals)    Frequency  Min 5X/week    PT Plan Discharge plan needs to be updated;Frequency needs to be updated    Co-evaluation             End of Session Equipment Utilized During Treatment: Gait belt Activity Tolerance: Patient limited by fatigue Patient left: in chair;with call  bell/phone within reach;with family/visitor present     Time: 1119-1150 PT Time Calculation (min) (ACUTE ONLY): 31 min  Charges:  $Gait Training: 8-22 mins $Therapeutic Activity: 8-22 mins                    G Codes:      Quin Hoop 03/20/2014, 12:16 PM  Roney Marion, [place] Pager [phone] Office [phone]

## 2014-03-20 NOTE — Progress Notes (Signed)
NUTRITION FOLLOW UP  Pt meets criteria for SEVERE MALNUTRITION in the context of chronic illness as evidenced by severe fat and muscle mass loss.  DOCUMENTATION CODES Per approved criteria  -Severe malnutrition in the context of chronic illness   INTERVENTION: Provide Resource Breeze po once daily, each supplement provides 250 kcal and 9 grams of protein.  Provide Ensure Complete po BID, each supplement provides 350 kcal and 13 grams of protein.  Encourage adequate PO intake.  NUTRITION DIAGNOSIS: Increased nutrient needs related to cancer and s/p surgery as evidenced by estimated nutrition needs; ongoing  Goal: Pt to meet >/= 90% of their estimated nutrition needs; not met  Monitor:  PO intake, weight trends, labs, I/O's  74 y.o. 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 Patient was found to have a left femoral neck fracture.   Procedure(2/29) and Anesthesia Type: LEFT HEMI-ARTHROPLASTY HIP - General  Pt has been advanced to a regular diet. Meal completion has been 50%. Pt usually drinks Ensure at home 1-2 times daily. RD to order Ensure to aid in caloric and protein needs. PT encouraged to eat her food at meals and to drink her supplements  Labs: Low sodium, calcium, and GFR.  Height: Ht Readings from Last 1 Encounters:  03/18/14 5' 2.5" (1.588 m)    Weight: Wt Readings from Last 1 Encounters:  03/18/14 109 lb (49.442 kg)    BMI:  Body mass index is 19.61 kg/(m^2).  Re-Estimated Nutritional Needs: Kcal: 1750-2000 Protein: 75-95 grams Fluid: 1.75 - 2 L/day  Skin: Incision left hip  Diet Order: Diet regular    Intake/Output Summary (Last 24 hours) at 03/20/14 1352 Last data filed at 03/20/14 0900  Gross per 24 hour  Intake    880 ml  Output    750  ml  Net    130 ml    Last BM: 2/29  Labs:   Recent Labs Lab 03/18/14 0142 03/18/14 2230 03/19/14 0610 03/20/14 0531  NA 130*  --  130* 128*  K 4.2  --  5.0 3.5  CL 94*  --  97 99  CO2 27  --  24 23  BUN 22  --  19 14  CREATININE 1.13* 1.19* 1.09 0.83  CALCIUM 9.0  --  8.0* 7.9*  GLUCOSE 118*  --  120* 88    CBG (last 3)  No results for input(s): GLUCAP in the last 72 hours.  Scheduled Meds: . sodium chloride   Intravenous Once  . dexamethasone  2 mg Oral BID  . docusate sodium  100 mg Oral BID  . enoxaparin (LOVENOX) injection  40 mg Subcutaneous Q24H  . feeding supplement (RESOURCE BREEZE)  1 Container Oral TID BM    Continuous Infusions: . sodium chloride 50 mL/hr at 03/20/14 1019    Past Medical History  Diagnosis Date  . Pneumonia, organism unspecified   . Status post chemotherapy  10/11/2012     carboplatin  . S/P radiation therapy 08/24/2012-09/11/2012    Whole Brain  / 32.5 Gy in 13 fractions  . 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 S/P radiation therapy  07/24/2012-08/10/2012      Mediastinum and Left Hilum / 35 Gy in 14 fractions    . Hypertension     Past  Surgical History  Procedure Laterality Date  . Nasal sinus surgery    . Video bronchoscopy Bilateral 07/05/2012    Procedure: VIDEO BRONCHOSCOPY WITHOUT FLUORO;  Surgeon: Tanda Rockers, MD;  Location: [facility];  Service: Cardiopulmonary;  Laterality: Bilateral;  . Portacath placement    . Hip arthroplasty Left 03/18/2014    Procedure: LEFT HEMI-ARTHROPLASTY HIP;  Surgeon: Nita Sells, MD;  Location: [place];  Service: Orthopedics;  Laterality: Left;    Kallie Locks, MS, RD, LDN Pager # [phone] After hours/ weekend pager # [phone]

## 2014-03-20 NOTE — Progress Notes (Signed)
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 Patient was recently admitted at [facility] 2/14-2/20 for acute encephalopathy. MRI had shown acute punctate left basal ganglia lacunar infarct, no new or progressive brain metastases were identified.  Patient was at her baseline fairly active, walking with walker, woke up in the night to go to the restroom and while walking back suddenly lost her balance and fell on the floor. The patient was found to have a left femoral neck fracture. Patient was transferred to [facility] for further management.  Assessment/Plan: Principal Problem:  Left Femoral neck fracture - Postop day #2, status post left hemiarthroplasty  - Pain management and DVT prophylaxis per orthopedic surgery  Active Problems: Anemia: Acute on chronic anemia - Hemoglobin improved to 8.5 after 2 units packed RBC transfusion   Hypotension with tachycardia -Continue to hold beta blocker, BP borderline but stable today    Small cell lung cancer, Brain metastases - Continue Decadron, patient is following Dr. Julien Nordmann outpatient - I discussed in detail with Dr. Julien Nordmann who recommended to continue with Decadron, can wean to 2 mg BID    Chronic  Hyponatremia - Currently at baseline, patient starting to eat today, decrease IV fluids to 50 mL an hour, KVO once patient tolerating oral diet    Protein-calorie malnutrition, severe - Placed a nutrition consult  DVT Prophylaxis: Lovenox  Code Status: DO NOT RESUSCITATE  Family Communication: Discussed with patient's son at the bedside   Disposition: Discussed in  detail with the patient's son at the bedside, requested different facility. Updated case management.  Consultants:  Orthopedics  Neurology  Procedures:  none  Antibiotics:  none    Subjective: Patient seen and examined, feels a lot better, hoarseness resolved, pain controlled, feeling a lot better from yesterday  Objective: Weight change:   Intake/Output Summary (Last 24 hours) at 03/20/14 1120 Last data filed at 03/20/14 0900  Gross per 24 hour  Intake    880 ml  Output    750 ml  Net    130 ml   Blood pressure 114/64, pulse 103, temperature 98.4 F (36.9 C), temperature source Oral, resp. rate 14, height 5' 2.5" (1.588 m), weight 49.442 kg (109 lb), SpO2 99 %.  Physical Exam: General: Alert and awake, oriented x3, not in any acute distress, normal voice today CVS: S1-S2 clear, no murmur rubs or gallops Chest: clear to auscultation bilaterally, no wheezing, rales or rhonchi Abdomen: soft nontender, nondistended, normal bowel sounds  Extremities: no cyanosis, clubbing or edema noted bilaterally   Lab Results: Basic Metabolic Panel:  Recent Labs Lab 03/19/14 0610 03/20/14 0531  NA 130* 128*  K 5.0 3.5  CL 97 99  CO2 24 23  GLUCOSE 120* 88  BUN 19 14  CREATININE 1.09 0.83  CALCIUM 8.0* 7.9*   Liver Function Tests: No results for input(s): AST, ALT, ALKPHOS, BILITOT, PROT, ALBUMIN in the last 168 hours. No results for input(s): LIPASE, AMYLASE in the last 168 hours. No results for input(s): AMMONIA in the last 168 hours. CBC:  Recent Labs Lab 03/18/14 0142  03/19/14 0610 03/20/14 0531  WBC 12.9*  < >  13.2* 9.3  NEUTROABS 11.3*  --   --   --   HGB 12.4  < > 7.1* 8.5*  HCT 37.2  < > 21.0* 24.2*  MCV 91.4  < > 90.5 85.8  PLT 265  < > 147* 121*  < > = values in this interval not displayed. Cardiac Enzymes: No results for input(s): CKTOTAL, CKMB, CKMBINDEX, TROPONINI in the last 168 hours. BNP: Invalid input(s): POCBNP CBG: No results for  input(s): GLUCAP in the last 168 hours.   Micro Results: Recent Results (from the past 240 hour(s))  Surgical pcr screen     Status: None   Collection Time: 03/18/14  8:50 PM  Result Value Ref Range Status   MRSA, PCR NEGATIVE NEGATIVE Final   Staphylococcus aureus NEGATIVE NEGATIVE Final    Comment:        The Xpert SA Assay (FDA approved for NASAL specimens in patients over 28 years of age), is one component of a comprehensive surveillance program.  Test performance has been validated by [facility] for patients greater than or equal to 4 year old. It is not intended to diagnose infection nor to guide or monitor treatment.     Studies/Results: Ct Abdomen Pelvis Wo Contrast  02/22/2014   CLINICAL DATA:  Current history of left-sided small-cell lung cancer.  EXAM: CT CHEST, ABDOMEN AND PELVIS WITHOUT CONTRAST  TECHNIQUE: Multidetector CT imaging of the chest, abdomen and pelvis was performed following the standard protocol without IV contrast.  COMPARISON:  CT scan of January 04, 2014.  FINDINGS: CT CHEST FINDINGS  No pneumothorax or significant pleural effusion is noted. Irregular mass density measuring 25 x 14 mm is noted laterally in the left upper lobe with adjacent 18 x 15 mm pleural-based abnormality. These are not significantly changed compared to prior exam. Stable 10 x 7 mm lingular nodule is noted. Stable scarring or post radiation changes are noted in the left upper lobe. Stable right apical scarring is noted. Stable small left lower lobe nodules are noted. No new nodules or masses are seen.  Anterior superior mediastinal adenopathy measuring 4.4 x 2.5 cm is noted which is not significantly changed compared to prior exam. Right internal jugular Port-A-Cath is noted with distal tip at the cavoatrial junction. Minimal pericardial effusion is noted. Sclerotic densities are noted in the T8 and T9 vertebral bodies which are unchanged compared to prior exam and consistent with  metastatic disease.  CT ABDOMEN AND PELVIS FINDINGS  Large right hepatic metastatic lesion is again noted measuring 6.0 x 5.5 cm in size, not significantly changed compared to prior exam. No gallstones are noted. Spleen and pancreas appear normal. Kidneys appear normal. No hydronephrosis or renal obstruction is noted. Stable bilateral adrenal masses are noted consistent with metastatic disease. Mild atherosclerotic calcifications of abdominal aorta are noted without aneurysm formation. The appendix appears normal. There is no evidence of bowel obstruction. No abnormal fluid collection is noted. Urinary bladder and uterus appear normal. Ovaries appear normal. No significant adenopathy is noted. No significant adenopathy is noted.  Stable sclerotic lesions are noted in the proximal right femur, right superior acetabulum, right sacrum and left acetabulum compared to prior exam consistent with metastatic disease.  IMPRESSION: Stable left upper lobe lung masses are noted consistent with malignancy. Stable mediastinal adenopathy is noted. Stable lingular nodule is noted as well as several left lower lobe nodules.  Stable large right hepatic metastatic lesion.  Stable bilateral adrenal masses consistent with metastatic disease.  Stable osseous  metastases as described above.   Electronically Signed   By: Sabino Dick M.D.   On: 02/22/2014 14:17   Dg Chest 2 View  03/01/2014   CLINICAL DATA:  Chest pain that started yesterday. Shortness of breath. Patient currently undergoing radiation therapy. History lung cancer.  EXAM: CHEST  2 VIEW  COMPARISON:  Chest CT - 02/22/2014  FINDINGS: Grossly unchanged cardiac silhouette and mediastinal contours with left-sided volume loss and mild deviation of the cardiomediastinal structures to the left. Stable positioning of support apparatus. Grossly unchanged nodular heterogeneous airspace opacities within the left mid lung. The lungs remain hyperexpanded. No new focal airspace  opacities. No pleural effusion or pneumothorax. No evidence of edema. No acute osseus abnormalities.  IMPRESSION: Grossly unchanged left mid lung heterogeneous opacities and volume loss compatible with provided history of lung cancer and undergoing radiation. No definite superimposed acute cardiopulmonary disease.   Electronically Signed   By: Sandi Mariscal M.D.   On: 03/01/2014 15:16   Ct Head Wo Contrast  03/01/2014   CLINICAL DATA:  74 year old with confusion. History of lung cancer with chest pain. History of intracranial metastatic disease.  EXAM: CT HEAD WITHOUT CONTRAST  TECHNIQUE: Contiguous axial images were obtained from the base of the skull through the vertex without contrast.  COMPARISON:  Brain MRI 02/12/2014  FINDINGS: There is diffuse low density throughout the periventricular and subcortical white matter which is similar to the prior examination. Slightly asymmetric cerebral atrophy. No evidence for acute hemorrhage, large mass lesion, midline shift, hydrocephalus or large new infarct. Visualized sinuses are clear.  Patient has known calvarial metastatic lesions. Again noted is a 1.5 cm lucent lesion in the occipital bone. There is also a subtle bone lesion near the left middle cranial fossa.  IMPRESSION: No acute intracranial abnormality.  Patient has known multiple small brain metastasis which are not well appreciated on this examination. Evidence for osseous metastatic lesions.  Diffuse white matter disease is similar to the previous examination.   Electronically Signed   By: Markus Daft M.D.   On: 03/01/2014 16:23   Ct Head W Wo Contrast  03/04/2014   CLINICAL DATA:  Weakness, change in behavior. History of metastatic lung cancer with treated brain metastasis.  EXAM: CT HEAD WITHOUT AND WITH CONTRAST  TECHNIQUE: Contiguous axial images were obtained from the base of the skull through the vertex without and with intravenous contrast  CONTRAST:  70mL OMNIPAQUE IOHEXOL 300 MG/ML  SOLN   COMPARISON:  CT of the head March 01, 2014 and MRI of the head October 08, 2013  FINDINGS: Moderate ventriculomegaly, likely on the basis of global parenchymal brain volume loss as there is overall commensurate enlargement of cerebral sulci and cerebellar folia, unchanged. No intraparenchymal hemorrhage, mass effect, midline shift. Confluent supratentorial white matter hypodensities, similar to prior CT. No acute large vascular territory infarct. Faint enhancing 4 mm lesion in RIGHT frontal gray-white matter junction, axial 13 of 31, and appears new from prior MRI. Patient's known treated metastasis are difficult to discern on CT.  No abnormal extra-axial fluid collections or extra-axial enhancement, no definite extra-axial masses.  No paranasal sinus air-fluid levels. Mastoid air cells are well aerated. No skull fracture. Again noted is the lytic lesion within paracentral occipital calvarium. Ocular globes and orbital contents are nonsuspicious.  IMPRESSION: Enhancing 4 mm RIGHT frontal lobe lesion, concerning for new metastasis though, MRI of the brain with contrast would be more sensitive. Re- demonstration of osseous metastasis.  Moderate global parenchymal brain  volume loss. Severe white matter changes, similar to prior examination may reflect sequelae of prior radiation.   Electronically Signed   By: Elon Alas   On: 03/04/2014 00:14   Ct Chest Wo Contrast  02/22/2014   CLINICAL DATA:  Current history of left-sided small-cell lung cancer.  EXAM: CT CHEST, ABDOMEN AND PELVIS WITHOUT CONTRAST  TECHNIQUE: Multidetector CT imaging of the chest, abdomen and pelvis was performed following the standard protocol without IV contrast.  COMPARISON:  CT scan of January 04, 2014.  FINDINGS: CT CHEST FINDINGS  No pneumothorax or significant pleural effusion is noted. Irregular mass density measuring 25 x 14 mm is noted laterally in the left upper lobe with adjacent 18 x 15 mm pleural-based abnormality. These  are not significantly changed compared to prior exam. Stable 10 x 7 mm lingular nodule is noted. Stable scarring or post radiation changes are noted in the left upper lobe. Stable right apical scarring is noted. Stable small left lower lobe nodules are noted. No new nodules or masses are seen.  Anterior superior mediastinal adenopathy measuring 4.4 x 2.5 cm is noted which is not significantly changed compared to prior exam. Right internal jugular Port-A-Cath is noted with distal tip at the cavoatrial junction. Minimal pericardial effusion is noted. Sclerotic densities are noted in the T8 and T9 vertebral bodies which are unchanged compared to prior exam and consistent with metastatic disease.  CT ABDOMEN AND PELVIS FINDINGS  Large right hepatic metastatic lesion is again noted measuring 6.0 x 5.5 cm in size, not significantly changed compared to prior exam. No gallstones are noted. Spleen and pancreas appear normal. Kidneys appear normal. No hydronephrosis or renal obstruction is noted. Stable bilateral adrenal masses are noted consistent with metastatic disease. Mild atherosclerotic calcifications of abdominal aorta are noted without aneurysm formation. The appendix appears normal. There is no evidence of bowel obstruction. No abnormal fluid collection is noted. Urinary bladder and uterus appear normal. Ovaries appear normal. No significant adenopathy is noted. No significant adenopathy is noted.  Stable sclerotic lesions are noted in the proximal right femur, right superior acetabulum, right sacrum and left acetabulum compared to prior exam consistent with metastatic disease.  IMPRESSION: Stable left upper lobe lung masses are noted consistent with malignancy. Stable mediastinal adenopathy is noted. Stable lingular nodule is noted as well as several left lower lobe nodules.  Stable large right hepatic metastatic lesion.  Stable bilateral adrenal masses consistent with metastatic disease.  Stable osseous metastases  as described above.   Electronically Signed   By: Sabino Dick M.D.   On: 02/22/2014 14:17   [name] Contrast  03/04/2014   CLINICAL DATA:  74 year old female with metastatic lung cancer status post radiation therapy. Restaging. Subsequent encounter.  EXAM: MRI HEAD WITH CONTRAST  TECHNIQUE: Multiplanar, multiecho pulse sequences of the brain and surrounding structures were obtained with intravenous contrast.  COMPARISON:  Head CT 01/01/2015.  Brain MRI 02/12/2014.  CONTRAST:  94mL MULTIHANCE GADOBENATE DIMEGLUMINE 529 MG/ML IV SOLN  FINDINGS: Today's study a 1.5 Tesla using affect her post-contrast images than the 3 Tesla study on 07/08/2014.  Widely scattered small brain metastases re- identified. No definite new or progressed brain metastasis identified. Several of the smallest metastases identified previously may have mildly regressed column a or may be less visible due to technical factors on today's study.  No associated intracranial mass affect. Confluent cerebral white matter T2 and FLAIR hyperintensity suggesting sequelae of prior whole brain radiation is stable. No new  or increased cerebral edema.  Osseous metastatic disease to the skull not significantly changed. No dural thickening or hyper enhancement identified.  There is a punctate focus of restricted diffusion without enhancement at the anterior inferior left basal ganglia seen on series 5 image 21 and series 6, image 26. There is mild associated T2 and FLAIR hyperintensity which is new. Series 8, image 11. No other diffusion abnormality.  Major intracranial vascular flow voids are stable. No ventriculomegaly. Stable small micro hemorrhage at the right vertex. No acute intracranial hemorrhage identified. Negative pituitary. Negative visualized spinal cord. Orbits, paranasal sinuses, and visualized internal auditory structures appear stable. Visualized scalp soft tissues are within normal limits.  IMPRESSION: 1. Punctate nonenhancing focus of  restricted diffusion in the inferior left basal ganglia, presumed acute lacunar infarct. No mass effect or hemorrhage. 2. No new or progressive brain metastasis identified. Stable or slight regression of metastatic Brain disease since 02/12/2014 allowing for technical differences between this exam and the prior. 3. No significant change in osseous metastatic disease to the skull.   Electronically Signed   By: Genevie Ann M.D.   On: 03/04/2014 14:50   Pelvis Portable  03/18/2014   CLINICAL DATA:  Status post left hip fracture  EXAM: PORTABLE PELVIS 1-2 VIEWS  COMPARISON:  None.  FINDINGS: Left unipolar hip arthroplasty.  No fracture or dislocation.  IMPRESSION: No complication following left hip arthroplasty.   Electronically Signed   By: Suzy Bouchard M.D.   On: 03/18/2014 19:00   Dg Chest Port 1 View  03/18/2014   CLINICAL DATA:  Status post fall; concern for chest injury. Initial encounter.  EXAM: PORTABLE CHEST - 1 VIEW  COMPARISON:  Chest radiograph performed 03/07/2014  FINDINGS: Vascular congestion has mildly improved from the prior study. Mild bilateral atelectasis is noted, with persistent elevation of the left hemidiaphragm. Left upper lobe opacity is grossly stable in appearance, reflecting the patient's known malignancy, with left-sided volume loss. No pleural effusion or pneumothorax is seen. Additional left-sided pulmonary nodules are better characterized on recent CT.  The cardiomediastinal silhouette is mildly enlarged. A right-sided chest port is noted ending about the distal SVC. No acute osseous abnormalities are identified.  IMPRESSION: 1. No displaced rib fracture seen. 2. Vascular congestion and mild cardiomegaly. Mild bilateral atelectasis, with persistent elevation of the left hemidiaphragm. Stable appearance to left upper lobe opacity, reflecting the patient's known malignancy, with left-sided volume loss.   Electronically Signed   By: Garald Balding M.D.   On: 03/18/2014 02:28   Dg  Chest Port 1 View  03/07/2014   CLINICAL DATA:  Dyspnea.  Left-sided lung cancer.  EXAM: PORTABLE CHEST - 1 VIEW  COMPARISON:  03/01/2014  FINDINGS: Right sided PowerPort tip overlies the level of superior vena cava. Heart size is enlarged. There is left upper lobe opacity unchanged compared with prior studies. There is left-sided volume loss. Heart size normal. There are no new consolidations or pleural effusions. Stable bronchitic changes.  IMPRESSION: 1. Stable appearance of left upper lobe opacity and left lung volume loss. 2. Stable bronchitic changes.   Electronically Signed   By: Nolon Nations M.D.   On: 03/07/2014 12:02   Ct Angio Chest Aorta W/cm &/or Wo/cm  03/01/2014   CLINICAL DATA:  Shortness of breath with chest pain and diaphoresis common known history of carcinoma lung  EXAM: CT ANGIOGRAPHY CHEST WITH CONTRAST  TECHNIQUE: Multidetector CT imaging of the chest was performed using the standard protocol during bolus administration of  intravenous contrast. Multiplanar CT image reconstructions and MIPs were obtained to evaluate the vascular anatomy.  CONTRAST:  175mL OMNIPAQUE IOHEXOL 350 MG/ML SOLN  COMPARISON:  02/22/2014  FINDINGS: The lungs are well aerated bilaterally. Scarring is again noted in the right upper lobe. No other significant changes noted within the right lung. There are changes in the left upper lobe consistent with the given clinical history of lung carcinoma. The overall appearance is stable from the recent exam. A few small nodules are noted in the left lower lobe also stable from the prior exam. No sizable effusion or pneumothorax is noted.  Considerable lymphadenopathy is noted encasing the vascular structures just above the aortic arch. These changes are stable in appearance from the prior study. No significant hilar adenopathy is noted. The thoracic aorta shows no findings to suggest dissection or aneurysmal dilatation. Minimal atherosclerotic calcifications are seen.  Although not optimized for pulmonary emboli evaluation no large central embolus is seen.  The upper abdomen there again noted changes consistent with hepatic metastatic disease. The dominant lesion in the right lobe is stable. There is suggestion of a smaller lesion in the lateral segment of left lobe of the liver posteriorly. Enlargement of the adrenal glands is noted bilaterally consistent with metastatic disease. Additionally large retrocrural lymph nodes are seen also consistent with localized metastatic disease. Sclerotic foci are again noted in the T8 and T9 vertebral bodies consistent with metastatic disease. Similar findings are noted in the L1, L3 and L4 vertebral bodies.  Review of the MIP images confirms the above findings.  IMPRESSION: No evidence of thoracic aortic dissection. No definitive pulmonary embolism is seen.  Changes consistent with the known history of left upper lobe lung carcinoma with metastatic disease to the mediastinal and retrocrural lymph nodes as well as multiple thoracic and lumbar vertebra, adrenal glands and liver. Multiple small nodules are again noted in the left lower lobe.  No acute abnormality is noted.   Electronically Signed   By: Inez Catalina M.D.   On: 03/01/2014 16:27   Dg Hip Unilat With Pelvis 2-3 Views Left  03/18/2014   CLINICAL DATA:  Status post fall onto hard tile floor. Left hip pain. Initial encounter.  EXAM: LEFT HIP (WITH PELVIS) 2-3 VIEWS  COMPARISON:  None.  FINDINGS: There appears to be a basicervical fracture through the left femoral neck, with mild rotation of the distal femur. The left femoral head remains seated at the acetabulum. The right hip joint is unremarkable. No significant degenerative change is appreciated. The sacroiliac joints are unremarkable in appearance.  The visualized bowel gas pattern is grossly unremarkable in appearance. Scattered phleboliths are noted within the pelvis.  IMPRESSION: Basicervical fracture through the left femoral  neck, with mild rotation of the distal femur.   Electronically Signed   By: Garald Balding M.D.   On: 03/18/2014 02:25    Medications: Scheduled Meds: . sodium chloride   Intravenous Once  . dexamethasone  2 mg Oral BID  . docusate sodium  100 mg Oral BID  . enoxaparin (LOVENOX) injection  40 mg Subcutaneous Q24H  . feeding supplement (RESOURCE BREEZE)  1 Container Oral TID BM   Time spent 25 minutes   LOS: 2 days   RAI,RIPUDEEP M.D. [facility] 03/20/2014, 11:20 AM Pager: [phone]  If 7PM-7AM, please contact night-coverage [email or web address] Password [email or web address]

## 2014-03-21 ENCOUNTER — Ambulatory Visit: Payer: Medicare Other

## 2014-03-21 ENCOUNTER — Encounter (HOSPITAL_COMMUNITY): Payer: Self-pay | Admitting: Orthopedic Surgery

## 2014-03-21 LAB — CBC
HEMATOCRIT: 21 % — AB (ref 36.0–46.0)
HEMOGLOBIN: 7.2 g/dL — AB (ref 12.0–15.0)
MCH: 29.8 pg (ref 26.0–34.0)
MCHC: 34.3 g/dL (ref 30.0–36.0)
MCV: 86.8 fL (ref 78.0–100.0)
Platelets: 126 10*3/uL — ABNORMAL LOW (ref 150–400)
RBC: 2.42 MIL/uL — ABNORMAL LOW (ref 3.87–5.11)
RDW: 17.3 % — ABNORMAL HIGH (ref 11.5–15.5)
WBC: 10.2 10*3/uL (ref 4.0–10.5)

## 2014-03-21 LAB — HEMOGLOBIN AND HEMATOCRIT, BLOOD
HCT: 24.3 % — ABNORMAL LOW (ref 36.0–46.0)
HEMOGLOBIN: 8.5 g/dL — AB (ref 12.0–15.0)

## 2014-03-21 LAB — PREPARE RBC (CROSSMATCH)

## 2014-03-21 MED ORDER — POLYETHYLENE GLYCOL 3350 17 G PO PACK
17.0000 g | PACK | Freq: Every day | ORAL | Status: DC
  Administered 2014-03-21: 17 g via ORAL
  Filled 2014-03-21: qty 1

## 2014-03-21 MED ORDER — SODIUM CHLORIDE 0.9 % IV SOLN: Freq: Once | INTRAVENOUS | Status: DC

## 2014-03-21 MED ORDER — BISACODYL 10 MG RE SUPP
10.0000 mg | Freq: Once | RECTAL | Status: AC
  Administered 2014-03-21: 10 mg via RECTAL
  Filled 2014-03-21: qty 1

## 2014-03-21 NOTE — Progress Notes (Signed)
Physical Therapy Treatment Patient Details Name: Cheryl Nielsen MRN: [id number] DOB: [birth year] Today's Date: 03/21/2014    History of Present Illness LEFT HEMI-ARTHROPLASTY HIP secondary to recent fall. Hx of lung Ca, undergoing chemo, brain mets    PT Comments    Pt. With Hgb of 7.2 today however did not have any dizziness. HR at rest and with activity in 110 to 116 range.  Still needing assist and cues for TDWB.  This is difficult for her due to overall and generalized weakness.  Follow Up Recommendations  SNF     Equipment Recommendations  Rolling walker with 5" wheels;3in1 (PT);Wheelchair (measurements PT);Wheelchair cushion (measurements PT);Other (comment);Hospital bed    Recommendations for Other Services       Precautions / Restrictions Precautions Precaution Comments: Hx of falls Required Braces or Orthoses:  (KI in ortho note but not in order set) Restrictions Weight Bearing Restrictions: Yes LLE Weight Bearing: Touchdown weight bearing    Mobility  Bed Mobility Overal bed mobility:  (pt. up in recliner)                Transfers Overall transfer level: Needs assistance Equipment used: Rolling walker (2 wheeled) Transfers: Sit to/from Stand Sit to Stand: +2 physical assistance;+2 safety/equipment;Min assist         General transfer comment: cues for safety, hand placement and technique with emphasis  on limiting WB L LE  Ambulation/Gait Ambulation/Gait assistance: +2 safety/equipment;Mod assist Ambulation Distance (Feet): 5 Feet (5' x 2 trials with seated rest) Assistive device: Rolling walker (2 wheeled) Gait Pattern/deviations: Step-to pattern Gait velocity: decreased   General Gait Details: Cues and manual assist for maintaining TDWB   Stairs            Wheelchair Mobility    Modified Rankin (Stroke Patients Only)       Balance                                    Cognition Arousal/Alertness: Awake/alert Behavior  During Therapy: WFL for tasks assessed/performed Overall Cognitive Status: Impaired/Different from baseline Area of Impairment: Memory;Problem solving;Safety/judgement;Following commands     Memory: Decreased short-term memory Following Commands: Follows one step commands consistently Safety/Judgement: Decreased awareness of safety;Decreased awareness of deficits     General Comments: Family reports patient had a stroke ~2 weeks ago, since stroke decrease in cognition    Exercises General Exercises - Lower Extremity Ankle Circles/Pumps: AROM;Both;10 reps    General Comments        Pertinent Vitals/Pain Pain Assessment: No/denies pain    Home Living                      Prior Function            PT Goals (current goals can now be found in the care plan section) Progress towards PT goals: Progressing toward goals    Frequency  Min 5X/week    PT Plan Current plan remains appropriate    Co-evaluation             End of Session Equipment Utilized During Treatment: Gait belt Activity Tolerance: Patient limited by fatigue Patient left: in chair;with call bell/phone within reach;with family/visitor present     Time: 0814-4818 PT Time Calculation (min) (ACUTE ONLY): 20 min  Charges:  $Gait Training: 8-22 mins  G CodesLadona Ridgel 03/21/2014, 10:27 AM Gerlean Ren PT Acute Rehab Services [phone] Beeper [phone]

## 2014-03-21 NOTE — Progress Notes (Signed)
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 Patient was recently admitted at [facility] 2/14-2/20 for acute encephalopathy. MRI had shown acute punctate left basal ganglia lacunar infarct, no new or progressive brain metastases were identified.  Patient was at her baseline fairly active, walking with walker, woke up in the night to go to the restroom and while walking back suddenly lost her balance and fell on the floor. The patient was found to have a left femoral neck fracture. Patient was transferred to [facility] for further management.  Assessment/Plan: Principal Problem:  Left Femoral neck fracture - Postop day #3, status post left hemiarthroplasty  - Pain management and DVT prophylaxis per orthopedic surgery  Active Problems: Anemia: Acute on chronic anemia - Hemoglobin improved to 8.5 after 2 units packed RBC transfusion, again down to 7.2 today - Patient feeling weak, dizzy, transfuse 1 more unit of packed RBC   Hypotension with tachycardia -Continue to hold beta blocker, BP borderline but stable today    Small cell lung cancer, Brain metastases - Continue Decadron, patient is following Dr. Julien Nordmann outpatient - I discussed in detail with Dr. Julien Nordmann who recommended to continue with Decadron, can wean to 2 mg BID   - I spoke with Dr. Julien Nordmann at the daughter's request, per recommendations, chemotherapy to stay on hold until Ms Mcgrory is recovering from her hip surgery, outpatient follow-up in the office to discuss further.  Chronic Hyponatremia - Recheck BMET, stop IV fluids, patient tolerating oral diet      Protein-calorie malnutrition, severe - Continue Ensure, boost  DVT Prophylaxis: Lovenox  Code Status: DO NOT RESUSCITATE  Family Communication: Discussed with patient's daughter at the bedside   Disposition: Discussed in detail with the patient's son at the bedside, requested different facility.   Consultants:  Orthopedics  Neurology  Procedures:  none  Antibiotics:  none    Subjective: Patient seen and examined, feeling pain and muscle spasms, had started PT  Objective: Weight change:   Intake/Output Summary (Last 24 hours) at 03/21/14 1349 Last data filed at 03/21/14 1245  Gross per 24 hour  Intake 1804.17 ml  Output      0 ml  Net 1804.17 ml   Blood pressure 131/72, pulse 99, temperature 98.1 F (36.7 C), temperature source Oral, resp. rate 17, height 5' 2.5" (1.588 m), weight 49.442 kg (109 lb), SpO2 100 %.  Physical Exam: General: Alert and awake, oriented x3, NAD, uncomfortable  CVS: S1-S2 clear, no murmur rubs or gallops Chest: clear to auscultation bilaterally, no wheezing, rales or rhonchi Abdomen: soft nontender, nondistended, normal bowel sounds  Extremities: no cyanosis, clubbing or edema noted bilaterally   Lab Results: Basic Metabolic Panel:  Recent Labs Lab 03/19/14 0610 03/20/14 0531  NA 130* 128*  K 5.0 3.5  CL 97 99  CO2 24 23  GLUCOSE 120* 88  BUN 19 14  CREATININE 1.09 0.83  CALCIUM 8.0* 7.9*   Liver Function Tests: No results for input(s): AST, ALT, ALKPHOS, BILITOT, PROT, ALBUMIN in the last 168 hours. No results for input(s): LIPASE, AMYLASE in the last 168 hours. No results for input(s): AMMONIA  in the last 168 hours. CBC:  Recent Labs Lab 03/18/14 0142  03/20/14 0531 03/21/14 0500  WBC 12.9*  < > 9.3 10.2  NEUTROABS 11.3*  --   --   --   HGB 12.4  < > 8.5* 7.2*  HCT 37.2  < > 24.2* 21.0*  MCV 91.4  < > 85.8 86.8  PLT 265  < > 121* 126*  < > = values in this interval not displayed. Cardiac Enzymes: No  results for input(s): CKTOTAL, CKMB, CKMBINDEX, TROPONINI in the last 168 hours. BNP: Invalid input(s): POCBNP CBG: No results for input(s): GLUCAP in the last 168 hours.   Micro Results: Recent Results (from the past 240 hour(s))  Surgical pcr screen     Status: None   Collection Time: 03/18/14  8:50 PM  Result Value Ref Range Status   MRSA, PCR NEGATIVE NEGATIVE Final   Staphylococcus aureus NEGATIVE NEGATIVE Final    Comment:        The Xpert SA Assay (FDA approved for NASAL specimens in patients over 61 years of age), is one component of a comprehensive surveillance program.  Test performance has been validated by [facility] for patients greater than or equal to 37 year old. It is not intended to diagnose infection nor to guide or monitor treatment.     Studies/Results: Ct Abdomen Pelvis Wo Contrast  02/22/2014   CLINICAL DATA:  Current history of left-sided small-cell lung cancer.  EXAM: CT CHEST, ABDOMEN AND PELVIS WITHOUT CONTRAST  TECHNIQUE: Multidetector CT imaging of the chest, abdomen and pelvis was performed following the standard protocol without IV contrast.  COMPARISON:  CT scan of January 04, 2014.  FINDINGS: CT CHEST FINDINGS  No pneumothorax or significant pleural effusion is noted. Irregular mass density measuring 25 x 14 mm is noted laterally in the left upper lobe with adjacent 18 x 15 mm pleural-based abnormality. These are not significantly changed compared to prior exam. Stable 10 x 7 mm lingular nodule is noted. Stable scarring or post radiation changes are noted in the left upper lobe. Stable right apical scarring is noted. Stable small left lower lobe nodules are noted. No new nodules or masses are seen.  Anterior superior mediastinal adenopathy measuring 4.4 x 2.5 cm is noted which is not significantly changed compared to prior exam. Right internal jugular Port-A-Cath is noted with distal tip at the cavoatrial junction. Minimal pericardial effusion is  noted. Sclerotic densities are noted in the T8 and T9 vertebral bodies which are unchanged compared to prior exam and consistent with metastatic disease.  CT ABDOMEN AND PELVIS FINDINGS  Large right hepatic metastatic lesion is again noted measuring 6.0 x 5.5 cm in size, not significantly changed compared to prior exam. No gallstones are noted. Spleen and pancreas appear normal. Kidneys appear normal. No hydronephrosis or renal obstruction is noted. Stable bilateral adrenal masses are noted consistent with metastatic disease. Mild atherosclerotic calcifications of abdominal aorta are noted without aneurysm formation. The appendix appears normal. There is no evidence of bowel obstruction. No abnormal fluid collection is noted. Urinary bladder and uterus appear normal. Ovaries appear normal. No significant adenopathy is noted. No significant adenopathy is noted.  Stable sclerotic lesions are noted in the proximal right femur, right superior acetabulum, right sacrum and left acetabulum compared to prior exam consistent with metastatic disease.  IMPRESSION: Stable left upper lobe lung masses are noted consistent with malignancy. Stable mediastinal adenopathy is noted. Stable lingular nodule is noted as well as several  left lower lobe nodules.  Stable large right hepatic metastatic lesion.  Stable bilateral adrenal masses consistent with metastatic disease.  Stable osseous metastases as described above.   Electronically Signed   By: Sabino Dick M.D.   On: 02/22/2014 14:17   Dg Chest 2 View  03/01/2014   CLINICAL DATA:  Chest pain that started yesterday. Shortness of breath. Patient currently undergoing radiation therapy. History lung cancer.  EXAM: CHEST  2 VIEW  COMPARISON:  Chest CT - 02/22/2014  FINDINGS: Grossly unchanged cardiac silhouette and mediastinal contours with left-sided volume loss and mild deviation of the cardiomediastinal structures to the left. Stable positioning of support apparatus. Grossly  unchanged nodular heterogeneous airspace opacities within the left mid lung. The lungs remain hyperexpanded. No new focal airspace opacities. No pleural effusion or pneumothorax. No evidence of edema. No acute osseus abnormalities.  IMPRESSION: Grossly unchanged left mid lung heterogeneous opacities and volume loss compatible with provided history of lung cancer and undergoing radiation. No definite superimposed acute cardiopulmonary disease.   Electronically Signed   By: Sandi Mariscal M.D.   On: 03/01/2014 15:16   Ct Head Wo Contrast  03/01/2014   CLINICAL DATA:  74 year old with confusion. History of lung cancer with chest pain. History of intracranial metastatic disease.  EXAM: CT HEAD WITHOUT CONTRAST  TECHNIQUE: Contiguous axial images were obtained from the base of the skull through the vertex without contrast.  COMPARISON:  Brain MRI 02/12/2014  FINDINGS: There is diffuse low density throughout the periventricular and subcortical white matter which is similar to the prior examination. Slightly asymmetric cerebral atrophy. No evidence for acute hemorrhage, large mass lesion, midline shift, hydrocephalus or large new infarct. Visualized sinuses are clear.  Patient has known calvarial metastatic lesions. Again noted is a 1.5 cm lucent lesion in the occipital bone. There is also a subtle bone lesion near the left middle cranial fossa.  IMPRESSION: No acute intracranial abnormality.  Patient has known multiple small brain metastasis which are not well appreciated on this examination. Evidence for osseous metastatic lesions.  Diffuse white matter disease is similar to the previous examination.   Electronically Signed   By: Markus Daft M.D.   On: 03/01/2014 16:23   Ct Head W Wo Contrast  03/04/2014   CLINICAL DATA:  Weakness, change in behavior. History of metastatic lung cancer with treated brain metastasis.  EXAM: CT HEAD WITHOUT AND WITH CONTRAST  TECHNIQUE: Contiguous axial images were obtained from the base  of the skull through the vertex without and with intravenous contrast  CONTRAST:  31mL OMNIPAQUE IOHEXOL 300 MG/ML  SOLN  COMPARISON:  CT of the head March 01, 2014 and MRI of the head October 08, 2013  FINDINGS: Moderate ventriculomegaly, likely on the basis of global parenchymal brain volume loss as there is overall commensurate enlargement of cerebral sulci and cerebellar folia, unchanged. No intraparenchymal hemorrhage, mass effect, midline shift. Confluent supratentorial white matter hypodensities, similar to prior CT. No acute large vascular territory infarct. Faint enhancing 4 mm lesion in RIGHT frontal gray-white matter junction, axial 13 of 31, and appears new from prior MRI. Patient's known treated metastasis are difficult to discern on CT.  No abnormal extra-axial fluid collections or extra-axial enhancement, no definite extra-axial masses.  No paranasal sinus air-fluid levels. Mastoid air cells are well aerated. No skull fracture. Again noted is the lytic lesion within paracentral occipital calvarium. Ocular globes and orbital contents are nonsuspicious.  IMPRESSION: Enhancing 4 mm RIGHT frontal lobe lesion, concerning for  new metastasis though, MRI of the brain with contrast would be more sensitive. Re- demonstration of osseous metastasis.  Moderate global parenchymal brain volume loss. Severe white matter changes, similar to prior examination may reflect sequelae of prior radiation.   Electronically Signed   By: Elon Alas   On: 03/04/2014 00:14   Ct Chest Wo Contrast  02/22/2014   CLINICAL DATA:  Current history of left-sided small-cell lung cancer.  EXAM: CT CHEST, ABDOMEN AND PELVIS WITHOUT CONTRAST  TECHNIQUE: Multidetector CT imaging of the chest, abdomen and pelvis was performed following the standard protocol without IV contrast.  COMPARISON:  CT scan of January 04, 2014.  FINDINGS: CT CHEST FINDINGS  No pneumothorax or significant pleural effusion is noted. Irregular mass density  measuring 25 x 14 mm is noted laterally in the left upper lobe with adjacent 18 x 15 mm pleural-based abnormality. These are not significantly changed compared to prior exam. Stable 10 x 7 mm lingular nodule is noted. Stable scarring or post radiation changes are noted in the left upper lobe. Stable right apical scarring is noted. Stable small left lower lobe nodules are noted. No new nodules or masses are seen.  Anterior superior mediastinal adenopathy measuring 4.4 x 2.5 cm is noted which is not significantly changed compared to prior exam. Right internal jugular Port-A-Cath is noted with distal tip at the cavoatrial junction. Minimal pericardial effusion is noted. Sclerotic densities are noted in the T8 and T9 vertebral bodies which are unchanged compared to prior exam and consistent with metastatic disease.  CT ABDOMEN AND PELVIS FINDINGS  Large right hepatic metastatic lesion is again noted measuring 6.0 x 5.5 cm in size, not significantly changed compared to prior exam. No gallstones are noted. Spleen and pancreas appear normal. Kidneys appear normal. No hydronephrosis or renal obstruction is noted. Stable bilateral adrenal masses are noted consistent with metastatic disease. Mild atherosclerotic calcifications of abdominal aorta are noted without aneurysm formation. The appendix appears normal. There is no evidence of bowel obstruction. No abnormal fluid collection is noted. Urinary bladder and uterus appear normal. Ovaries appear normal. No significant adenopathy is noted. No significant adenopathy is noted.  Stable sclerotic lesions are noted in the proximal right femur, right superior acetabulum, right sacrum and left acetabulum compared to prior exam consistent with metastatic disease.  IMPRESSION: Stable left upper lobe lung masses are noted consistent with malignancy. Stable mediastinal adenopathy is noted. Stable lingular nodule is noted as well as several left lower lobe nodules.  Stable large right  hepatic metastatic lesion.  Stable bilateral adrenal masses consistent with metastatic disease.  Stable osseous metastases as described above.   Electronically Signed   By: Sabino Dick M.D.   On: 02/22/2014 14:17   [name] Contrast  03/04/2014   CLINICAL DATA:  74 year old female with metastatic lung cancer status post radiation therapy. Restaging. Subsequent encounter.  EXAM: MRI HEAD WITH CONTRAST  TECHNIQUE: Multiplanar, multiecho pulse sequences of the brain and surrounding structures were obtained with intravenous contrast.  COMPARISON:  Head CT 01/01/2015.  Brain MRI 02/12/2014.  CONTRAST:  41mL MULTIHANCE GADOBENATE DIMEGLUMINE 529 MG/ML IV SOLN  FINDINGS: Today's study a 1.5 Tesla using affect her post-contrast images than the 3 Tesla study on 07/08/2014.  Widely scattered small brain metastases re- identified. No definite new or progressed brain metastasis identified. Several of the smallest metastases identified previously may have mildly regressed column a or may be less visible due to technical factors on today's study.  No  associated intracranial mass affect. Confluent cerebral white matter T2 and FLAIR hyperintensity suggesting sequelae of prior whole brain radiation is stable. No new or increased cerebral edema.  Osseous metastatic disease to the skull not significantly changed. No dural thickening or hyper enhancement identified.  There is a punctate focus of restricted diffusion without enhancement at the anterior inferior left basal ganglia seen on series 5 image 21 and series 6, image 26. There is mild associated T2 and FLAIR hyperintensity which is new. Series 8, image 11. No other diffusion abnormality.  Major intracranial vascular flow voids are stable. No ventriculomegaly. Stable small micro hemorrhage at the right vertex. No acute intracranial hemorrhage identified. Negative pituitary. Negative visualized spinal cord. Orbits, paranasal sinuses, and visualized internal auditory  structures appear stable. Visualized scalp soft tissues are within normal limits.  IMPRESSION: 1. Punctate nonenhancing focus of restricted diffusion in the inferior left basal ganglia, presumed acute lacunar infarct. No mass effect or hemorrhage. 2. No new or progressive brain metastasis identified. Stable or slight regression of metastatic Brain disease since 02/12/2014 allowing for technical differences between this exam and the prior. 3. No significant change in osseous metastatic disease to the skull.   Electronically Signed   By: Genevie Ann M.D.   On: 03/04/2014 14:50   Pelvis Portable  03/18/2014   CLINICAL DATA:  Status post left hip fracture  EXAM: PORTABLE PELVIS 1-2 VIEWS  COMPARISON:  None.  FINDINGS: Left unipolar hip arthroplasty.  No fracture or dislocation.  IMPRESSION: No complication following left hip arthroplasty.   Electronically Signed   By: Suzy Bouchard M.D.   On: 03/18/2014 19:00   Dg Chest Port 1 View  03/18/2014   CLINICAL DATA:  Status post fall; concern for chest injury. Initial encounter.  EXAM: PORTABLE CHEST - 1 VIEW  COMPARISON:  Chest radiograph performed 03/07/2014  FINDINGS: Vascular congestion has mildly improved from the prior study. Mild bilateral atelectasis is noted, with persistent elevation of the left hemidiaphragm. Left upper lobe opacity is grossly stable in appearance, reflecting the patient's known malignancy, with left-sided volume loss. No pleural effusion or pneumothorax is seen. Additional left-sided pulmonary nodules are better characterized on recent CT.  The cardiomediastinal silhouette is mildly enlarged. A right-sided chest port is noted ending about the distal SVC. No acute osseous abnormalities are identified.  IMPRESSION: 1. No displaced rib fracture seen. 2. Vascular congestion and mild cardiomegaly. Mild bilateral atelectasis, with persistent elevation of the left hemidiaphragm. Stable appearance to left upper lobe opacity, reflecting the patient's  known malignancy, with left-sided volume loss.   Electronically Signed   By: Garald Balding M.D.   On: 03/18/2014 02:28   Dg Chest Port 1 View  03/07/2014   CLINICAL DATA:  Dyspnea.  Left-sided lung cancer.  EXAM: PORTABLE CHEST - 1 VIEW  COMPARISON:  03/01/2014  FINDINGS: Right sided PowerPort tip overlies the level of superior vena cava. Heart size is enlarged. There is left upper lobe opacity unchanged compared with prior studies. There is left-sided volume loss. Heart size normal. There are no new consolidations or pleural effusions. Stable bronchitic changes.  IMPRESSION: 1. Stable appearance of left upper lobe opacity and left lung volume loss. 2. Stable bronchitic changes.   Electronically Signed   By: Nolon Nations M.D.   On: 03/07/2014 12:02   Ct Angio Chest Aorta W/cm &/or Wo/cm  03/01/2014   CLINICAL DATA:  Shortness of breath with chest pain and diaphoresis common known history of carcinoma lung  EXAM:  CT ANGIOGRAPHY CHEST WITH CONTRAST  TECHNIQUE: Multidetector CT imaging of the chest was performed using the standard protocol during bolus administration of intravenous contrast. Multiplanar CT image reconstructions and MIPs were obtained to evaluate the vascular anatomy.  CONTRAST:  122mL OMNIPAQUE IOHEXOL 350 MG/ML SOLN  COMPARISON:  02/22/2014  FINDINGS: The lungs are well aerated bilaterally. Scarring is again noted in the right upper lobe. No other significant changes noted within the right lung. There are changes in the left upper lobe consistent with the given clinical history of lung carcinoma. The overall appearance is stable from the recent exam. A few small nodules are noted in the left lower lobe also stable from the prior exam. No sizable effusion or pneumothorax is noted.  Considerable lymphadenopathy is noted encasing the vascular structures just above the aortic arch. These changes are stable in appearance from the prior study. No significant hilar adenopathy is noted. The  thoracic aorta shows no findings to suggest dissection or aneurysmal dilatation. Minimal atherosclerotic calcifications are seen. Although not optimized for pulmonary emboli evaluation no large central embolus is seen.  The upper abdomen there again noted changes consistent with hepatic metastatic disease. The dominant lesion in the right lobe is stable. There is suggestion of a smaller lesion in the lateral segment of left lobe of the liver posteriorly. Enlargement of the adrenal glands is noted bilaterally consistent with metastatic disease. Additionally large retrocrural lymph nodes are seen also consistent with localized metastatic disease. Sclerotic foci are again noted in the T8 and T9 vertebral bodies consistent with metastatic disease. Similar findings are noted in the L1, L3 and L4 vertebral bodies.  Review of the MIP images confirms the above findings.  IMPRESSION: No evidence of thoracic aortic dissection. No definitive pulmonary embolism is seen.  Changes consistent with the known history of left upper lobe lung carcinoma with metastatic disease to the mediastinal and retrocrural lymph nodes as well as multiple thoracic and lumbar vertebra, adrenal glands and liver. Multiple small nodules are again noted in the left lower lobe.  No acute abnormality is noted.   Electronically Signed   By: Inez Catalina M.D.   On: 03/01/2014 16:27   Dg Hip Unilat With Pelvis 2-3 Views Left  03/18/2014   CLINICAL DATA:  Status post fall onto hard tile floor. Left hip pain. Initial encounter.  EXAM: LEFT HIP (WITH PELVIS) 2-3 VIEWS  COMPARISON:  None.  FINDINGS: There appears to be a basicervical fracture through the left femoral neck, with mild rotation of the distal femur. The left femoral head remains seated at the acetabulum. The right hip joint is unremarkable. No significant degenerative change is appreciated. The sacroiliac joints are unremarkable in appearance.  The visualized bowel gas pattern is grossly  unremarkable in appearance. Scattered phleboliths are noted within the pelvis.  IMPRESSION: Basicervical fracture through the left femoral neck, with mild rotation of the distal femur.   Electronically Signed   By: Garald Balding M.D.   On: 03/18/2014 02:25    Medications: Scheduled Meds: . sodium chloride   Intravenous Once  . sodium chloride   Intravenous Once  . bisacodyl  10 mg Rectal Once  . dexamethasone  2 mg Oral BID  . docusate sodium  100 mg Oral BID  . enoxaparin (LOVENOX) injection  40 mg Subcutaneous Q24H  . feeding supplement (ENSURE COMPLETE)  237 mL Oral BID BM  . feeding supplement (RESOURCE BREEZE)  1 Container Oral Q1500  . polyethylene glycol  17 g  Oral Daily   Time spent 25 minutes   LOS: 3 days   Dynisha Due M.D. [facility] 03/21/2014, 1:49 PM Pager: [phone]  If 7PM-7AM, please contact night-coverage [email or web address] Password [email or web address]

## 2014-03-21 NOTE — Care Management Note (Signed)
CARE MANAGEMENT NOTE 03/21/2014  Patient:  Cheryl Nielsen, Cheryl Nielsen   Account Number:  [id number]  Date Initiated:  03/21/2014  Documentation initiated by:  Ricki Miller  Subjective/Objective Assessment:   74 yr old female admitted s/p fall with left hip fracture. Patient had a left total hip arthroplasty. Pt. has hx. of lung CA with mets.     Action/Plan:   Patient will go to [facility] for shortterm rehab.Social worker is aware.   Anticipated DC Date:  03/22/2014   Anticipated DC Plan:  SKILLED NURSING FACILITY  In-house referral  Clinical Social Worker      DC Planning Services  CM consult      [facility] Choice  NA   Choice offered to / List presented to:     DME arranged  NA        [place] arranged  NA      Status of service:  Completed, signed off Medicare Important Message given?  YES (If response is "NO", the following Medicare IM given date fields will be blank) Date Medicare IM given:  03/21/2014 Medicare IM given by:  Ricki Miller Date Additional Medicare IM given:   Additional Medicare IM given by:    Discharge Disposition:  [place]  Per UR Regulation:  Reviewed for med. necessity/level of care/duration of stay  If discussed at [place] of Stay Meetings, dates discussed:    Comments:

## 2014-03-21 NOTE — Progress Notes (Signed)
   PATIENT ID: Cheryl Nielsen   3 Days Post-Op Procedure(s) (LRB): LEFT HEMI-ARTHROPLASTY HIP (Left)  Subjective: No pain in left hip, comfortable in bed. Per PT has general weakness making TDWB difficult. Per SW likely d/c to SNF today and patient is agreeable.   Objective:  Filed Vitals:   03/21/14 0449  BP: 118/76  Pulse: 87  Temp: 97.7 F (36.5 C)  Resp: 17     Awake,alert, orientated L hip dressing saturated Wiggles toes, distally NVI Knee immbolizer in good position  Labs:   Recent Labs  03/18/14 2230 03/19/14 0610 03/20/14 0531 03/21/14 0500  HGB 8.4* 7.1* 8.5* 7.2*   Recent Labs  03/20/14 0531 03/21/14 0500  WBC 9.3 10.2  RBC 2.82* 2.42*  HCT 24.2* 21.0*  PLT 121* 126*   Recent Labs  03/19/14 0610 03/20/14 0531  NA 130* 128*  K 5.0 3.5  CL 97 99  CO2 24 23  BUN 19 14  CREATININE 1.09 0.83  GLUCOSE 120* 88  CALCIUM 8.0* 7.9*    Assessment and Plan: 2 day s/p left hip hemi Post hip precautions, cont knee immbolizer Will change dressing with Dr. Tamera Punt before noon Touchdown weightbearing, continue with PT DME hospital bed, wheelchair ordered ABLA- expected,  Down to 7.2 today, will likely need another transfusion prior to d/c, per primary team D/c to SNF when medically stable per primary team, likely today Minimize narcotics, norco only if needed Continue lovenox daily x 3 weeks, script in chart for d/c  VTE proph: lovenox, SCDs

## 2014-03-21 NOTE — Clinical Social Work Note (Signed)
Patient has a bed at [facility] once stable for discharge.   CSW will continue to follow for continued support and to facilitate pt's discharge needs once medically stable.  FL-2 and DNR form on chart for MD signature.   Glendon Axe, MSW, LCSWA [phone] 03/21/2014 2:46 PM

## 2014-03-22 LAB — TYPE AND SCREEN
ABO/RH(D): O POS
Antibody Screen: NEGATIVE
UNIT DIVISION: 0
Unit division: 0
Unit division: 0

## 2014-03-22 LAB — CBC
HEMATOCRIT: 24.5 % — AB (ref 36.0–46.0)
HEMOGLOBIN: 8.5 g/dL — AB (ref 12.0–15.0)
MCH: 29.2 pg (ref 26.0–34.0)
MCHC: 34.7 g/dL (ref 30.0–36.0)
MCV: 84.2 fL (ref 78.0–100.0)
Platelets: 138 10*3/uL — ABNORMAL LOW (ref 150–400)
RBC: 2.91 MIL/uL — AB (ref 3.87–5.11)
RDW: 17.5 % — ABNORMAL HIGH (ref 11.5–15.5)
WBC: 9.9 10*3/uL (ref 4.0–10.5)

## 2014-03-22 LAB — BASIC METABOLIC PANEL
Anion gap: 6 (ref 5–15)
BUN: 12 mg/dL (ref 6–23)
CALCIUM: 8.3 mg/dL — AB (ref 8.4–10.5)
CO2: 28 mmol/L (ref 19–32)
CREATININE: 0.79 mg/dL (ref 0.50–1.10)
Chloride: 98 mmol/L (ref 96–112)
GFR calc Af Amer: >90 mL/min (ref >90)
GFR calc non Af Amer: 80 mL/min — ABNORMAL LOW (ref >90)
GLUCOSE: 106 mg/dL — AB (ref 70–99)
Potassium: 3.9 mmol/L (ref 3.5–5.1)
Sodium: 132 mmol/L — ABNORMAL LOW (ref 135–145)

## 2014-03-22 MED ORDER — METHOCARBAMOL 500 MG PO TABS: 500.0000 mg | ORAL_TABLET | Freq: Four times a day (QID) | ORAL | Status: DC | PRN

## 2014-03-22 MED ORDER — HEPARIN SOD (PORK) LOCK FLUSH 100 UNIT/ML IV SOLN: 500.0000 [IU] | INTRAVENOUS | Status: DC | PRN

## 2014-03-22 MED ORDER — BOOST / RESOURCE BREEZE PO LIQD: 1.0000 | Freq: Every day | ORAL | Status: DC

## 2014-03-22 MED ORDER — ENSURE COMPLETE PO LIQD: 237.0000 mL | Freq: Two times a day (BID) | ORAL | Status: DC

## 2014-03-22 MED ORDER — DEXAMETHASONE 2 MG PO TABS: 2.0000 mg | ORAL_TABLET | Freq: Two times a day (BID) | ORAL | Status: DC

## 2014-03-22 NOTE — Progress Notes (Signed)
Report called to RN at [facility].

## 2014-03-22 NOTE — Discharge Planning (Signed)
Patient will discharge today per MD order. Patient will discharge to [facility] RN to call report prior to transportation to [phone] Transportation: [facility]- called at 11:44am  CSW sent discharge summary to SNF for review.  Packet is complete.  RN, patient and family aware of discharge plans.  Nonnie Done, [place] [phone]  Psychiatric & Orthopedics (5N 1-16) Clinical Social Worker

## 2014-03-22 NOTE — Progress Notes (Signed)
Physical Therapy Treatment Patient Details Name: Cheryl Nielsen MRN: [id number] DOB: 1940/09/03 Today's Date: 03/22/2014    History of Present Illness LEFT HEMI-ARTHROPLASTY HIP secondary to recent fall. Hx of lung Ca, undergoing chemo, brain mets    PT Comments    Pt slowly progressing. 2 person (A) for safety. Cont to recommend SNF for post acute rehab.   Follow Up Recommendations  SNF     Equipment Recommendations  Rolling walker with 5" wheels;3in1 (PT);Wheelchair (measurements PT);Wheelchair cushion (measurements PT);Other (comment);Hospital bed    Recommendations for Other Services       Precautions / Restrictions Precautions Precautions: Fall;Posterior Hip Precaution Comments: Hx of falls; pt able to independently recall 2/3 hip precautions Required Braces or Orthoses: Knee Immobilizer - Left Knee Immobilizer - Left: Other (comment) (in bed) Restrictions Weight Bearing Restrictions: Yes LLE Weight Bearing: Touchdown weight bearing    Mobility  Bed Mobility Overal bed mobility: Needs Assistance Bed Mobility: Supine to Sit     Supine to sit: Min assist;HOB elevated     General bed mobility comments: (A) to bring Lt LE off EOB; max cues for safety and sequencing   Transfers Overall transfer level: Needs assistance Equipment used: Rolling walker (2 wheeled) Transfers: Sit to/from Stand Sit to Stand: +2 physical assistance;+2 safety/equipment;Min assist         General transfer comment: pt with difficulty maintaining TDWB status; 2 person for safety; cues for technique and sequencing  Ambulation/Gait Ambulation/Gait assistance: +2 safety/equipment;Min assist Ambulation Distance (Feet): 10 Feet Assistive device: Rolling walker (2 wheeled) Gait Pattern/deviations: Step-to pattern Gait velocity: decreased Gait velocity interpretation: Below normal speed for age/gender General Gait Details: pt taking incr step with Rt LE; cues for shortened step for safety and  cues to maintain TDWB status   Stairs            Wheelchair Mobility    Modified Rankin (Stroke Patients Only)       Balance                                    Cognition Arousal/Alertness: Awake/alert Behavior During Therapy: WFL for tasks assessed/performed Overall Cognitive Status: Impaired/Different from baseline Area of Impairment: Memory;Problem solving;Safety/judgement;Following commands     Memory: Decreased recall of precautions Following Commands: Follows one step commands consistently Safety/Judgement: Decreased awareness of safety;Decreased awareness of deficits   Problem Solving: Slow processing;Requires verbal cues General Comments: Family reports patient had a stroke ~2 weeks ago, since stroke decrease in cognition    Exercises General Exercises - Lower Extremity Ankle Circles/Pumps: AROM;Both;10 reps Long Arc Quad: AROM;Both;10 reps    General Comments        Pertinent Vitals/Pain Pain Assessment: 0-10 Pain Score: 6  Pain Location: Lt hip Pain Descriptors / Indicators: Sore Pain Intervention(s): Premedicated before session;Monitored during session;Repositioned    Home Living                      Prior Function            PT Goals (current goals can now be found in the care plan section) Acute Rehab PT Goals Patient Stated Goal: to get looking right PT Goal Formulation: With patient Time For Goal Achievement: 04/02/14 Potential to Achieve Goals: Good Progress towards PT goals: Progressing toward goals    Frequency  Min 3X/week    PT Plan Current plan remains appropriate;Frequency needs to be  updated    Co-evaluation             End of Session Equipment Utilized During Treatment: Gait belt Activity Tolerance: Patient limited by fatigue Patient left: in chair;with call bell/phone within reach     Time: 0922-0935 PT Time Calculation (min) (ACUTE ONLY): 13 min  Charges:  $Gait Training: 8-22  mins                    G CodesElie Nielsen Cheryl Nielsen, Cheryl Nielsen 03/22/2014, 11:25 AM

## 2014-03-22 NOTE — Progress Notes (Signed)
Pt with incontinent episodes throughout shift. Pt requested to wear Depends. Education provided regarding policy. Pt stated that she had her own Depends and would be more comfortable wearing them as she wears them "at home."

## 2014-03-22 NOTE — Progress Notes (Signed)
   PATIENT ID: Cheryl Nielsen   4 Days Post-Op Procedure(s) (LRB): LEFT HEMI-ARTHROPLASTY HIP (Left)  Subjective: Lying in bed today, more comfortable than yesterday. Reports she tolerated PT yesterday well, walking around the room. No other complaints or concerns.  Objective:  Filed Vitals:   03/22/14 0600  BP: 153/79  Pulse: 89  Temp: 97.6 F (36.4 C)  Resp: 14     L hip dressing with scant serous fluid, intact L knee immobilizer in place Wiggles toes distally NVI  Labs:   Recent Labs  03/20/14 0531 03/21/14 0500 03/21/14 2100 03/22/14 0500  HGB 8.5* 7.2* 8.5* 8.5*   Recent Labs  03/21/14 0500 03/21/14 2100 03/22/14 0500  WBC 10.2  --  9.9  RBC 2.42*  --  2.91*  HCT 21.0* 24.3* 24.5*  PLT 126*  --  138*   Recent Labs  03/20/14 0531 03/22/14 0500  NA 128* 132*  K 3.5 3.9  CL 99 98  CO2 23 28  BUN 14 12  CREATININE 0.83 0.79  GLUCOSE 88 106*  CALCIUM 7.9* 8.3*    Assessment and Plan: 3 days s/p left hip hemi Post hip precautions, cont knee immbolizer New compression dressing is working,drainage slowing Touchdown weightbearing, continue with PT ABLA- expected,8.5 today improved after transfusion Talked to patient about hgb, bleeding and VTE prophylaxis, she is allergic to ASA and given high risk of VTE with cancer hx we will continue lovenox as VTE prophylaxis D/c to SNF when medically stable per primary team, likely today FU Dr. Tamera Punt in 2 weeks Minimize narcotics, norco only if needed Continue lovenox daily x 3 weeks, script in chart for d/c  VTE proph: lovenox, SCDs

## 2014-03-22 NOTE — Discharge Instructions (Signed)
Discharge Instructions after Hip Hemiarthplasty   Touchdown weight bearing on affective leg Observe posterior hip precautions Use ice on the hip intermittently over the first 48 hours after surgery.  Pain medicine has been prescribed for you.  Use your medicine liberally over the first 48 hours, and then you can begin to taper your use. You may take Extra Strength Tylenol or Tylenol only in place of the pain pills. DO NOT take ANY nonsteroidal anti-inflammatory pain medications: Advil, Motrin, Ibuprofen, Aleve, Naproxen or Naprosyn.  Take aspirin or anticoagulant medication as prescribed for 3 weeks after surgery. Please notify if allergic or sensitivity to aspirin. You may remove your dressing after two days or once bleeding stops. You may shower 5 days after surgery. The incisions CANNOT get wet prior to 5 days. Simply allow the water to wash over the site and then pat dry. Do not rub the incisions.    Please call [phone] during normal business hours or [phone] after hours for any problems. Including the following:  - excessive redness of the incisions - drainage for more than 4 days - fever of more than 101.5 F  *Please note that pain medications will not be refilled after hours or on weekends.

## 2014-03-22 NOTE — Discharge Summary (Signed)
Physician Discharge Summary  Patient ID: Cheryl Nielsen MRN: [id number] DOB/AGE: 75-16-1942 75 y.o.  Admit date: 03/18/2014 Discharge date: 03/22/2014  Primary Care Physician:  Lujean Amel, MD  Discharge Diagnoses:    . Femoral neck fracture postop day #4  . 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 Brain metastases . Hyponatremia . Small cell lung cancer . Protein-calorie malnutrition, severe . Basal ganglia infarction  Consults  Orthopedics, Dr. Tamera Punt   Recommendations for Outpatient Follow-up:   Orthopedics postop instructions Post hip precautions, cont knee immbolizer  Touchdown weightbearing, continue with PT  DME hospital bed, wheelchair ordered   Minimize narcotics, norco only if needed  Continue lovenox daily x 3 weeks   TESTS THAT NEED FOLLOW-UP CBC, BMET   DIET: regular diet   Allergies:   Allergies  Allergen Reactions  . Asa [Aspirin]     GI upset  . Codeine     syncope  . Tramadol Nausea And Vomiting     Discharge Medications:   Medication List    STOP taking these medications        azithromycin 250 MG tablet  Commonly known as:  ZITHROMAX     oxyCODONE-acetaminophen 5-325 MG per tablet  Commonly known as:  PERCOCET/ROXICET      TAKE these medications        acetaminophen 500 MG tablet  Commonly known as:  TYLENOL  Take 1,000 mg by mouth every 6 (six) hours as needed (For headache.).     dexamethasone 2 MG tablet  Commonly known as:  DECADRON  Take 1 tablet (2 mg total) by mouth 2 (two) times daily.     diphenoxylate-atropine 2.5-0.025 MG per tablet  Commonly known as:  LOMOTIL  Take 1 tablet by mouth 2 (two) times daily as needed for diarrhea or loose stools.     docusate sodium 100 MG capsule  Commonly known as:  COLACE  Take 1 capsule (100 mg total) by mouth 2 (two) times daily. If taking pain medications daily. Otherwise take as needed for constipation     enoxaparin 40 MG/0.4ML  injection  Commonly known as:  LOVENOX  Inject 0.4 mLs (40 mg total) into the skin daily.     feeding supplement (ENSURE COMPLETE) Liqd  Take 237 mLs by mouth 2 (two) times daily between meals.     feeding supplement (RESOURCE BREEZE) Liqd  Take 1 Container by mouth daily at 3 pm.     GLUCOSAMINE HCL PO  Take 1 tablet by mouth daily.     HYDROcodone-acetaminophen 5-325 MG per tablet  Commonly known as:  NORCO  Take 1 tablet by mouth every 6 (six) hours as needed for moderate pain.     lidocaine-prilocaine cream  Commonly known as:  EMLA  Apply 1 application topically daily as needed (Applies to port-a-cath.).     methocarbamol 500 MG tablet  Commonly known as:  ROBAXIN  Take 1 tablet (500 mg total) by mouth every 6 (six) hours as needed for muscle spasms.     metoprolol tartrate 25 MG tablet  Commonly known as:  LOPRESSOR  Take 1 tablet (25 mg total) by mouth 2 (two) times daily.     multivitamin with minerals Tabs tablet  Take 1 tablet by mouth daily with lunch. She takes Dance movement psychotherapist Adult 50+.     MYRBETRIQ 25 MG Tb24 tablet  Generic drug:  mirabegron ER  Take 25 mg by mouth daily.     ondansetron 8 MG  tablet  Commonly known as:  ZOFRAN  Take 1 tablet (8 mg total) by mouth every 8 (eight) hours as needed for nausea or vomiting.     polyethylene glycol packet  Commonly known as:  MIRALAX / GLYCOLAX  Take 17 g by mouth daily as needed.     prochlorperazine 10 MG tablet  Commonly known as:  COMPAZINE  Take 1 tablet (10 mg total) by mouth every 6 (six) hours as needed for nausea or vomiting.     senna 8.6 MG Tabs tablet  Commonly known as:  SENOKOT  Take 1 tablet by mouth daily as needed for mild constipation.         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 Patient was recently admitted at [facility] 2/14-2/20 for acute encephalopathy. MRI had shown acute punctate left basal ganglia lacunar infarct, no new or progressive brain metastases were identified. Patient was at her baseline fairly active, walking with walker, woke up in the night to go to the restroom and while walking back suddenly lost her balance and fell on the floor. The patient was found to have a left femoral neck fracture. Patient was transferred to [facility] for further management.  Hospital Course:  Left Femoral neck fracture after a mechanical fall. Patient was admitted and orthopedics was consulted. Patient underwent left hemiarthroplasty on 03/18/14. Postop day #4 Postoperatively, hemoglobin was down to 7.1 and received 2 units pRBC's and then 7.2 on 3/3, patient received 1 unit of packed RBC transfusion. Hemoglobin has remained stable at 8.5 at discharge.  Per orthopedics, continue Lovenox for 3 weeks for DVT prophylaxis  Anemia: Acute on chronic anemia, acute blood loss after the surgery however patient has chronic anemia due to small cell lung cancer, on chemotherapy - Hemoglobin has improved to 8.5 after 3 units total packed RBC transfusions during the hospitalization.   Hypotension with tachycardia Improved, likely due to anemia and surgery. Restart metoprolol.   Small cell lung cancer, Brain metastases - Continue Decadron, patient is following Dr. Julien Nordmann outpatient - I discussed in detail with Dr. Julien Nordmann who recommended to continue with Decadron, can wean to 2 mg BID.  I spoke with Dr. Julien Nordmann at the daughter's request, per recommendations, chemotherapy to stay on hold until Cheryl Nielsen is recovering from her hip surgery, outpatient follow-up in the office to discuss further.  Chronic Hyponatremia -Improving, patient is tolerating oral diet    Protein-calorie malnutrition, severe - Continue Ensure, boost   Day of Discharge BP 153/79 mmHg  Pulse 89  Temp(Src) 97.6 F (36.4 C) (Oral)  Resp 14  Ht 5' 2.5" (1.588 m)  Wt 49.442 kg (109 lb)  BMI 19.61 kg/m2  SpO2 99%  Physical Exam: General: Alert and awake oriented x3 not in any acute distress. CVS: S1-S2 clear no murmur rubs or gallops Chest: clear to auscultation bilaterally, no wheezing rales or rhonchi Abdomen: soft nontender, nondistended, normal bowel sounds Extremities: no cyanosis, clubbing or edema noted bilaterally Neuro: Cranial nerves II-XII intact, no focal neurological deficits   The results of significant diagnostics from this hospitalization (including imaging, microbiology, ancillary and laboratory) are listed below for reference.    LAB RESULTS: Basic Metabolic Panel:  Recent Labs Lab 03/20/14 0531 03/22/14 0500  NA 128* 132*  K 3.5 3.9  CL 99 98  CO2 23 28  GLUCOSE 88 106*  BUN 14 12  CREATININE 0.83 0.79  CALCIUM 7.9* 8.3*   Liver Function Tests: No results for input(s): AST, ALT, ALKPHOS, BILITOT, PROT, ALBUMIN in the last 168 hours. No results for input(s): LIPASE, AMYLASE in the last 168 hours. No results for input(s): AMMONIA in the last 168 hours. CBC:  Recent Labs Lab 03/18/14 0142  03/21/14 0500 03/21/14 2100 03/22/14 0500  WBC 12.9*  < > 10.2  --  9.9  NEUTROABS 11.3*  --   --   --   --   HGB 12.4  < > 7.2* 8.5* 8.5*  HCT 37.2  < > 21.0* 24.3* 24.5*  MCV 91.4  < > 86.8  --  84.2  PLT 265  < > 126*  --  138*  < > = values in this interval not displayed. Cardiac Enzymes: No results for input(s): CKTOTAL, CKMB, CKMBINDEX, TROPONINI in the last 168 hours. BNP: Invalid input(s): POCBNP CBG: No results for input(s): GLUCAP in the last 168 hours.  Significant Diagnostic Studies:  Pelvis Portable  03/18/2014   CLINICAL DATA:  Status post left hip fracture  EXAM: PORTABLE PELVIS 1-2 VIEWS  COMPARISON:  None.  FINDINGS:  Left unipolar hip arthroplasty.  No fracture or dislocation.  IMPRESSION: No complication following left hip arthroplasty.   Electronically Signed   By: Suzy Bouchard M.D.   On: 03/18/2014 19:00   Dg Chest Port 1 View  03/18/2014   CLINICAL DATA:  Status post fall; concern for chest injury. Initial encounter.  EXAM: PORTABLE CHEST - 1 VIEW  COMPARISON:  Chest radiograph performed 03/07/2014  FINDINGS: Vascular congestion has mildly improved from the prior study. Mild bilateral atelectasis is noted, with persistent elevation of the left hemidiaphragm. Left upper lobe opacity is grossly stable in appearance, reflecting the patient's known malignancy, with left-sided volume loss. No pleural effusion or pneumothorax is seen. Additional left-sided pulmonary nodules are better characterized on recent CT.  The cardiomediastinal silhouette is mildly enlarged. A right-sided chest port is noted ending about the distal SVC. No acute osseous abnormalities are identified.  IMPRESSION: 1. No displaced rib fracture seen. 2. Vascular congestion and mild cardiomegaly. Mild bilateral atelectasis, with persistent elevation of the left hemidiaphragm. Stable appearance to left upper lobe opacity, reflecting the patient's known malignancy, with left-sided volume loss.   Electronically Signed   By: Garald Balding M.D.   On: 03/18/2014 02:28   Dg Hip Unilat With Pelvis 2-3 Views Left  03/18/2014   CLINICAL DATA:  Status post fall onto hard tile floor. Left hip pain. Initial encounter.  EXAM: LEFT HIP (WITH PELVIS) 2-3 VIEWS  COMPARISON:  None.  FINDINGS: There appears to be a basicervical fracture through the left femoral neck, with mild rotation of the distal femur. The left femoral head remains seated at the acetabulum. The right hip joint is unremarkable. No significant degenerative change is appreciated. The sacroiliac joints are unremarkable in appearance.  The visualized bowel gas pattern is grossly unremarkable in  appearance. Scattered phleboliths are noted within the pelvis.  IMPRESSION: Basicervical fracture through the left femoral neck, with mild rotation of the distal femur.   Electronically Signed   By: Garald Balding M.D.   On: 03/18/2014 02:25    2D ECHO:   Disposition and Follow-up:     Discharge Instructions  Diet general    Complete by:  As directed      Increase activity slowly    Complete by:  As directed      Touch down weight bearing    Complete by:  As directed   Laterality:  left  Extremity:  Lower            DISPOSITION: Skilled nursing facility for rehabilitation  DISCHARGE FOLLOW-UP Follow-up Information    Follow up with Nita Sells, MD. Schedule an appointment as soon as possible for a visit in 2 weeks.   Specialty:  Orthopedic Surgery   Why:  for hospital follow-up   Contact information:   [address] [place] [address] [phone]       Follow up with Lujean Amel, MD. Schedule an appointment as soon as possible for a visit in 2 weeks.   Specialty:  Family Medicine   Why:  for hospital follow-up   Contact information:   [address] [place] [address] [phone]       Follow up with Eppie Gibson, MD On 04/05/2014.   Specialty:  Radiation Oncology   Why:  at 4:20PM   Contact information:   [address] [place] [address] [phone]        Time spent on Discharge: 35 mins  Signed:   Daisi Kentner M.D. [facility] 03/22/2014, 8:48 AM Pager: [phone]

## 2014-03-23 ENCOUNTER — Encounter: Payer: Self-pay | Admitting: Internal Medicine

## 2014-03-25 ENCOUNTER — Encounter: Payer: Self-pay | Admitting: Medical Oncology

## 2014-03-25 ENCOUNTER — Telehealth: Payer: Self-pay | Admitting: Medical Oncology

## 2014-03-25 ENCOUNTER — Other Ambulatory Visit: Payer: Self-pay | Admitting: Medical Oncology

## 2014-03-25 ENCOUNTER — Other Ambulatory Visit: Payer: Medicare Other

## 2014-03-25 NOTE — Telephone Encounter (Signed)
email sent about appt 3/18

## 2014-03-26 ENCOUNTER — Non-Acute Institutional Stay (SKILLED_NURSING_FACILITY): Payer: Medicare Other | Admitting: Adult Health

## 2014-03-26 DIAGNOSIS — N3281 Overactive bladder: Secondary | ICD-10-CM

## 2014-03-26 DIAGNOSIS — E43 Unspecified severe protein-calorie malnutrition: Secondary | ICD-10-CM | POA: Diagnosis not present

## 2014-03-26 DIAGNOSIS — K5900 Constipation, unspecified: Secondary | ICD-10-CM | POA: Diagnosis not present

## 2014-03-26 DIAGNOSIS — D630 Anemia in neoplastic disease: Secondary | ICD-10-CM

## 2014-03-26 DIAGNOSIS — C7931 Secondary malignant neoplasm of brain: Secondary | ICD-10-CM | POA: Diagnosis not present

## 2014-03-26 DIAGNOSIS — C3491 Malignant neoplasm of unspecified part of right bronchus or lung: Secondary | ICD-10-CM

## 2014-03-26 DIAGNOSIS — S72002S Fracture of unspecified part of neck of left femur, sequela: Secondary | ICD-10-CM

## 2014-03-26 DIAGNOSIS — I10 Essential (primary) hypertension: Secondary | ICD-10-CM | POA: Diagnosis not present

## 2014-03-27 ENCOUNTER — Encounter: Payer: Self-pay | Admitting: Adult Health

## 2014-03-27 ENCOUNTER — Non-Acute Institutional Stay (SKILLED_NURSING_FACILITY): Payer: Medicare Other | Admitting: Internal Medicine

## 2014-03-27 DIAGNOSIS — I10 Essential (primary) hypertension: Secondary | ICD-10-CM

## 2014-03-27 DIAGNOSIS — K5901 Slow transit constipation: Secondary | ICD-10-CM | POA: Diagnosis not present

## 2014-03-27 DIAGNOSIS — E43 Unspecified severe protein-calorie malnutrition: Secondary | ICD-10-CM | POA: Diagnosis not present

## 2014-03-27 DIAGNOSIS — S72002S Fracture of unspecified part of neck of left femur, sequela: Secondary | ICD-10-CM | POA: Diagnosis not present

## 2014-03-27 DIAGNOSIS — D62 Acute posthemorrhagic anemia: Secondary | ICD-10-CM

## 2014-03-27 DIAGNOSIS — C3491 Malignant neoplasm of unspecified part of right bronchus or lung: Secondary | ICD-10-CM

## 2014-03-27 NOTE — Progress Notes (Signed)
Patient ID: Cheryl Nielsen, female   DOB: 08-14-1940, 74 y.o.   MRN: [id number]     [facility]   PCP: No primary care provider on file.  Code Status: DNR  Allergies not on file  Chief Complaint  Patient presents with  . New Admit To SNF     HPI:  74 year old patient is here for short term rehabilitation post hospital admission from 03/18/14-03/22/14 with left femoral fracture. She underwent left hemiarthroplasty. She received 3 u prbc transfusion for acute blood loss anemia. She is seen in her room today. Her pain is under control with current regimen. She feels tired today post therapy session. Had a bowel movement today. Denies any concerns. She has PMH of CVA, HTN, Small cell lung cancer among others.  Review of Systems:  Constitutional: Negative for fever, chills, diaphoresis.  HENT: Negative for headache, congestion, nasal discharge Eyes: Negative for eye pain, blurred vision, double vision and discharge.  Respiratory: Negative for cough, shortness of breath and wheezing.   Cardiovascular: Negative for chest pain, palpitations, leg swelling.  Gastrointestinal: Negative for heartburn, nausea, vomiting, abdominal pain Genitourinary: Negative for dysuria Musculoskeletal: Negative for back pain, falls in facility Skin: Negative for itching, rash.  Neurological: Negative for dizziness, tingling, focal weakness Psychiatric/Behavioral: Negative for depression  PMH: HTN, CVA, small cell lung cancer  Past Surgical History  Procedure Laterality Date  . Nasal sinus surgery    . Video bronchoscopy Bilateral 07/05/2012    Procedure: VIDEO BRONCHOSCOPY WITHOUT FLUORO;  Surgeon: Tanda Rockers, MD;  Location: [facility];  Service: Cardiopulmonary;  Laterality: Bilateral;  . Portacath placement    . Hip arthroplasty Left 03/18/2014    Procedure: LEFT HEMI-ARTHROPLASTY HIP;  Surgeon: Nita Sells, MD;  Location: [facility];  Service: Orthopedics;   Laterality: Left;   Family History  Problem Relation Age of Onset  . Heart disease Mother   . Heart disease Father     PVD  . Prostate cancer Father   . Lung cancer Mother 56    was a smoker  . Colon cancer Brother    History   Social History  . Marital Status: Widowed    Spouse Name: N/A  . Number of Children: N/A  . Years of Education: N/A   Occupational History  . Hairdresser    Social History Main Topics  . Smoking status: Former Smoker -- 1.00 packs/day for 40 years    Types: Cigarettes    Quit date: 04/18/2012  . Smokeless tobacco: Never Used  . Alcohol Use: No  . Drug Use: No  . Sexual Activity: No   Other Topics Concern  . Not on file   Social History Narrative   Medications: Medication reviewed. See MAR   Physical Exam:  Filed Vitals:   03/27/14 1631  BP: 147/88  Pulse: 88  Temp: 98 F (36.7 C)  Resp: 18  Weight: 108 lb 3.2 oz (49.079 kg)  SpO2: 98%    General- elderly female, in no acute distress Head- normocephalic, atraumatic Throat- moist mucus membrane Neck- no cervical lymphadenopathy Cardiovascular- normal s1,s2, no murmurs, palpable dorsalis pedis and radial pulses, no leg edema Respiratory- bilateral clear to auscultation, no wheeze, no rhonchi, no crackles, no use of accessory muscles Abdomen- bowel sounds present, soft, non tender Musculoskeletal- able to move all 4 extremities, generalized weakness, left leg ROM limited Skin- warm and dry, surgical dressing on left hip in place Psychiatry- alert and oriented  Labs reviewed   Assessment/Plan  Left femoral fracture  S/P left hemiarthroplasty. continue Lovenox 40 mg subcutaneous daily for DVT prophylaxis. Continue Norco 5/325 mg 1 tab q6h prn for pain and robaxin 500 mg q6h prn for muscle spasm. Has follow up with orthopedics. Is touch toe weight bearing for now. To use knee immobilizer. Will have patient work with PT/OT as tolerated to regain strength and restore function.   Fall precautions are in place.  Acute blood loss anemia S/P transfusion 3 units transfusion in hospital, monitor h&h  Constipation Had bowel movement this am. Continue current regimen of Senokot-S 2 tabs bid and Miralax daily for now  Hypertension Stable reading. continue Lopressor 25 mg bid and monitor bp  Protein calorie malnutrition Monitor weight and po intake, continue supplements  Small cell lung cancer with brain metastases continue Decadron 2 mg bid and has f/u with oncologist   Goals of care: short term rehabilitation    Labs/tests ordered: cbc, bmp  Family/ staff Communication: reviewed care plan with patient and nursing supervisor    Blanchie Serve, MD  [place] [phone] (Monday-Friday 8 am - 5 pm) [phone] (afterhours)

## 2014-03-27 NOTE — Progress Notes (Signed)
Patient ID: Cheryl Nielsen, female   DOB: [birth year], 74 y.o.   MRN: [id number]   03/26/14  Facility:  [facility] Location:  [place] Room Number: [id number] LEVEL OF CARE:  SNF (31)  Routine Visit  Chief Complaint  Patient presents with  . Hospitalization Follow-up    Left femoral fracture S/P left hemiarthroplasty, anemia, small cell lung cancer, protein calorie malnutrition, chronic hyponatremia and constipation    HISTORY OF PRESENT ILLNESS: This is a 74 year old female who has been admitted to [facility] on 03/22/14 from [facility]. 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 She had a fall sustaining left femoral fracture. She had left hemi-arthroplasty on 03/18/14. She has been admitted for short-term rehabilitation.  PAST MEDICAL HISTORY:  Past Medical History  Diagnosis Date  . Pneumonia, organism unspecified   . Status post chemotherapy  10/11/2012     carboplatin  . S/P radiation therapy 08/24/2012-09/11/2012    Whole Brain  / 32.5 Gy in 13 fractions  . 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 S/P radiation therapy  07/24/2012-08/10/2012      Mediastinum and Left Hilum / 35 Gy in 14 fractions    . Hypertension     CURRENT MEDICATIONS: Reviewed per MAR/see medication list  Allergies  Allergen Reactions  . Asa [Aspirin]     GI upset  . Codeine     syncope  . Tramadol Nausea And Vomiting     REVIEW OF SYSTEMS:  GENERAL: no change in appetite, no fatigue, no weight changes, no fever, chills or weakness RESPIRATORY: no cough, SOB, DOE, wheezing, hemoptysis CARDIAC: no chest pain, edema or palpitations GI: no abdominal pain, diarrhea, heart burn, nausea or vomiting, +constipation  PHYSICAL EXAMINATION  GENERAL: no acute distress, normal body habitus EYES: conjunctivae normal, sclerae normal, normal  eye lids NECK: supple, trachea midline, no neck masses, no thyroid tenderness, no thyromegaly LYMPHATICS: no LAN in the neck, no supraclavicular LAN RESPIRATORY: breathing is even & unlabored, BS CTAB CARDIAC: RRR, no murmur,no extra heart sounds, no edema GI: abdomen soft, normal BS, no masses, no tenderness, no hepatomegaly, no splenomegaly EXTREMITIES: Able to move 4 extremities PSYCHIATRIC: the patient is alert & oriented to person, affect & behavior appropriate  LABS/RADIOLOGY: Labs reviewed: Basic Metabolic Panel:  Recent Labs  01/08/14 1458 01/15/14 1438  03/04/14 0446  03/19/14 0610 03/20/14 0531 03/22/14 0500  NA 136 135*  < > 126*  < > 130* 128* 132*  K 4.2 4.2  < > 3.6  < > 5.0 3.5 3.9  CL  --   --   < > 93*  < > 97 99 98  CO2 29 27  < > 23  < > 24 23 28   GLUCOSE 112 175*  < > 134*  < > 120* 88 106*  BUN 25.5 22.6  < > 23  < > 19 14 12   CREATININE 1.7* 1.5*  < > 1.02  < > 1.09 0.83 0.79  CALCIUM 9.6 9.6  < > 8.7  < > 8.0* 7.9* 8.3*  MG 2.4 2.4  --  1.6  --   --   --   --   PHOS  --   --   --  4.1  --   --   --   --   < > =  values in this interval not displayed. Liver Function Tests:  Recent Labs  03/02/14 0127 03/03/14 2211 03/04/14 0446  AST 28 30 26   ALT 18 46* 18  ALKPHOS 104 109 88  BILITOT 0.3 0.7 0.5  PROT 6.3 7.3 6.5  ALBUMIN 3.6 4.1 3.6     Recent Labs  03/03/14 2212  AMMONIA 13   CBC:  Recent Labs  02/20/14 1120  03/03/14 2211  03/18/14 0142  03/20/14 0531 03/21/14 0500 03/21/14 2100 03/22/14 0500  WBC 11.7*  < > 6.6  < > 12.9*  < > 9.3 10.2  --  9.9  NEUTROABS 9.8*  --  6.1  --  11.3*  --   --   --   --   --   HGB 11.1*  < > 12.8  < > 12.4  < > 8.5* 7.2* 8.5* 8.5*  HCT 34.5*  < > 37.2  < > 37.2  < > 24.2* 21.0* 24.3* 24.5*  MCV 92.8  < > 88.8  < > 91.4  < > 85.8 86.8  --  84.2  PLT 152  < > 391  < > 265  < > 121* 126*  --  138*  < > = values in this interval not displayed.  Cardiac Enzymes:  Recent Labs  03/01/14 1954  03/02/14 0127 03/02/14 0709  TROPONINI <0.03 <0.03 <0.03    Dg Chest 2 View  03/01/2014   CLINICAL DATA:  Chest pain that started yesterday. Shortness of breath. Patient currently undergoing radiation therapy. History lung cancer.  EXAM: CHEST  2 VIEW  COMPARISON:  Chest CT - 02/22/2014  FINDINGS: Grossly unchanged cardiac silhouette and mediastinal contours with left-sided volume loss and mild deviation of the cardiomediastinal structures to the left. Stable positioning of support apparatus. Grossly unchanged nodular heterogeneous airspace opacities within the left mid lung. The lungs remain hyperexpanded. No new focal airspace opacities. No pleural effusion or pneumothorax. No evidence of edema. No acute osseus abnormalities.  IMPRESSION: Grossly unchanged left mid lung heterogeneous opacities and volume loss compatible with provided history of lung cancer and undergoing radiation. No definite superimposed acute cardiopulmonary disease.   Electronically Signed   By: Sandi Mariscal M.D.   On: 03/01/2014 15:16   Ct Head Wo Contrast  03/01/2014   CLINICAL DATA:  74 year old with confusion. History of lung cancer with chest pain. History of intracranial metastatic disease.  EXAM: CT HEAD WITHOUT CONTRAST  TECHNIQUE: Contiguous axial images were obtained from the base of the skull through the vertex without contrast.  COMPARISON:  Brain MRI 02/12/2014  FINDINGS: There is diffuse low density throughout the periventricular and subcortical white matter which is similar to the prior examination. Slightly asymmetric cerebral atrophy. No evidence for acute hemorrhage, large mass lesion, midline shift, hydrocephalus or large new infarct. Visualized sinuses are clear.  Patient has known calvarial metastatic lesions. Again noted is a 1.5 cm lucent lesion in the occipital bone. There is also a subtle bone lesion near the left middle cranial fossa.  IMPRESSION: No acute intracranial abnormality.  Patient has known multiple  small brain metastasis which are not well appreciated on this examination. Evidence for osseous metastatic lesions.  Diffuse white matter disease is similar to the previous examination.   Electronically Signed   By: Markus Daft M.D.   On: 03/01/2014 16:23   Ct Head W Wo Contrast  03/04/2014   CLINICAL DATA:  Weakness, change in behavior. History of metastatic lung cancer with treated brain metastasis.  EXAM: CT HEAD WITHOUT AND WITH CONTRAST  TECHNIQUE: Contiguous axial images were obtained from the base of the skull through the vertex without and with intravenous contrast  CONTRAST:  2mL OMNIPAQUE IOHEXOL 300 MG/ML  SOLN  COMPARISON:  CT of the head March 01, 2014 and MRI of the head October 08, 2013  FINDINGS: Moderate ventriculomegaly, likely on the basis of global parenchymal brain volume loss as there is overall commensurate enlargement of cerebral sulci and cerebellar folia, unchanged. No intraparenchymal hemorrhage, mass effect, midline shift. Confluent supratentorial white matter hypodensities, similar to prior CT. No acute large vascular territory infarct. Faint enhancing 4 mm lesion in RIGHT frontal gray-white matter junction, axial 13 of 31, and appears new from prior MRI. Patient's known treated metastasis are difficult to discern on CT.  No abnormal extra-axial fluid collections or extra-axial enhancement, no definite extra-axial masses.  No paranasal sinus air-fluid levels. Mastoid air cells are well aerated. No skull fracture. Again noted is the lytic lesion within paracentral occipital calvarium. Ocular globes and orbital contents are nonsuspicious.  IMPRESSION: Enhancing 4 mm RIGHT frontal lobe lesion, concerning for new metastasis though, MRI of the brain with contrast would be more sensitive. Re- demonstration of osseous metastasis.  Moderate global parenchymal brain volume loss. Severe white matter changes, similar to prior examination may reflect sequelae of prior radiation.    Electronically Signed   By: Elon Alas   On: 03/04/2014 00:14   Mr Brain W Contrast  03/04/2014   CLINICAL DATA:  74 year old female with metastatic lung cancer status post radiation therapy. Restaging. Subsequent encounter.  EXAM: MRI HEAD WITH CONTRAST  TECHNIQUE: Multiplanar, multiecho pulse sequences of the brain and surrounding structures were obtained with intravenous contrast.  COMPARISON:  Head CT 01/01/2015.  Brain MRI 02/12/2014.  CONTRAST:  40mL MULTIHANCE GADOBENATE DIMEGLUMINE 529 MG/ML IV SOLN  FINDINGS: Today's study a 1.5 Tesla using affect her post-contrast images than the 3 Tesla study on 07/08/2014.  Widely scattered small brain metastases re- identified. No definite new or progressed brain metastasis identified. Several of the smallest metastases identified previously may have mildly regressed column a or may be less visible due to technical factors on today's study.  No associated intracranial mass affect. Confluent cerebral white matter T2 and FLAIR hyperintensity suggesting sequelae of prior whole brain radiation is stable. No new or increased cerebral edema.  Osseous metastatic disease to the skull not significantly changed. No dural thickening or hyper enhancement identified.  There is a punctate focus of restricted diffusion without enhancement at the anterior inferior left basal ganglia seen on series 5 image 21 and series 6, image 26. There is mild associated T2 and FLAIR hyperintensity which is new. Series 8, image 11. No other diffusion abnormality.  Major intracranial vascular flow voids are stable. No ventriculomegaly. Stable small micro hemorrhage at the right vertex. No acute intracranial hemorrhage identified. Negative pituitary. Negative visualized spinal cord. Orbits, paranasal sinuses, and visualized internal auditory structures appear stable. Visualized scalp soft tissues are within normal limits.  IMPRESSION: 1. Punctate nonenhancing focus of restricted diffusion in  the inferior left basal ganglia, presumed acute lacunar infarct. No mass effect or hemorrhage. 2. No new or progressive brain metastasis identified. Stable or slight regression of metastatic Brain disease since 02/12/2014 allowing for technical differences between this exam and the prior. 3. No significant change in osseous metastatic disease to the skull.   Electronically Signed   By: Genevie Ann M.D.   On: 03/04/2014 14:50  Pelvis Portable  03/18/2014   CLINICAL DATA:  Status post left hip fracture  EXAM: PORTABLE PELVIS 1-2 VIEWS  COMPARISON:  None.  FINDINGS: Left unipolar hip arthroplasty.  No fracture or dislocation.  IMPRESSION: No complication following left hip arthroplasty.   Electronically Signed   By: Suzy Bouchard M.D.   On: 03/18/2014 19:00   Dg Chest Port 1 View  03/18/2014   CLINICAL DATA:  Status post fall; concern for chest injury. Initial encounter.  EXAM: PORTABLE CHEST - 1 VIEW  COMPARISON:  Chest radiograph performed 03/07/2014  FINDINGS: Vascular congestion has mildly improved from the prior study. Mild bilateral atelectasis is noted, with persistent elevation of the left hemidiaphragm. Left upper lobe opacity is grossly stable in appearance, reflecting the patient's known malignancy, with left-sided volume loss. No pleural effusion or pneumothorax is seen. Additional left-sided pulmonary nodules are better characterized on recent CT.  The cardiomediastinal silhouette is mildly enlarged. A right-sided chest port is noted ending about the distal SVC. No acute osseous abnormalities are identified.  IMPRESSION: 1. No displaced rib fracture seen. 2. Vascular congestion and mild cardiomegaly. Mild bilateral atelectasis, with persistent elevation of the left hemidiaphragm. Stable appearance to left upper lobe opacity, reflecting the patient's known malignancy, with left-sided volume loss.   Electronically Signed   By: Garald Balding M.D.   On: 03/18/2014 02:28   Dg Chest Port 1  View  03/07/2014   CLINICAL DATA:  Dyspnea.  Left-sided lung cancer.  EXAM: PORTABLE CHEST - 1 VIEW  COMPARISON:  03/01/2014  FINDINGS: Right sided PowerPort tip overlies the level of superior vena cava. Heart size is enlarged. There is left upper lobe opacity unchanged compared with prior studies. There is left-sided volume loss. Heart size normal. There are no new consolidations or pleural effusions. Stable bronchitic changes.  IMPRESSION: 1. Stable appearance of left upper lobe opacity and left lung volume loss. 2. Stable bronchitic changes.   Electronically Signed   By: Nolon Nations M.D.   On: 03/07/2014 12:02   Ct Angio Chest Aorta W/cm &/or Wo/cm  03/01/2014   CLINICAL DATA:  Shortness of breath with chest pain and diaphoresis common known history of carcinoma lung  EXAM: CT ANGIOGRAPHY CHEST WITH CONTRAST  TECHNIQUE: Multidetector CT imaging of the chest was performed using the standard protocol during bolus administration of intravenous contrast. Multiplanar CT image reconstructions and MIPs were obtained to evaluate the vascular anatomy.  CONTRAST:  155mL OMNIPAQUE IOHEXOL 350 MG/ML SOLN  COMPARISON:  02/22/2014  FINDINGS: The lungs are well aerated bilaterally. Scarring is again noted in the right upper lobe. No other significant changes noted within the right lung. There are changes in the left upper lobe consistent with the given clinical history of lung carcinoma. The overall appearance is stable from the recent exam. A few small nodules are noted in the left lower lobe also stable from the prior exam. No sizable effusion or pneumothorax is noted.  Considerable lymphadenopathy is noted encasing the vascular structures just above the aortic arch. These changes are stable in appearance from the prior study. No significant hilar adenopathy is noted. The thoracic aorta shows no findings to suggest dissection or aneurysmal dilatation. Minimal atherosclerotic calcifications are seen. Although not  optimized for pulmonary emboli evaluation no large central embolus is seen.  The upper abdomen there again noted changes consistent with hepatic metastatic disease. The dominant lesion in the right lobe is stable. There is suggestion of a smaller lesion in the lateral segment  of left lobe of the liver posteriorly. Enlargement of the adrenal glands is noted bilaterally consistent with metastatic disease. Additionally large retrocrural lymph nodes are seen also consistent with localized metastatic disease. Sclerotic foci are again noted in the T8 and T9 vertebral bodies consistent with metastatic disease. Similar findings are noted in the L1, L3 and L4 vertebral bodies.  Review of the MIP images confirms the above findings.  IMPRESSION: No evidence of thoracic aortic dissection. No definitive pulmonary embolism is seen.  Changes consistent with the known history of left upper lobe lung carcinoma with metastatic disease to the mediastinal and retrocrural lymph nodes as well as multiple thoracic and lumbar vertebra, adrenal glands and liver. Multiple small nodules are again noted in the left lower lobe.  No acute abnormality is noted.   Electronically Signed   By: Inez Catalina M.D.   On: 03/01/2014 16:27   Dg Hip Unilat With Pelvis 2-3 Views Left  03/18/2014   CLINICAL DATA:  Status post fall onto hard tile floor. Left hip pain. Initial encounter.  EXAM: LEFT HIP (WITH PELVIS) 2-3 VIEWS  COMPARISON:  None.  FINDINGS: There appears to be a basicervical fracture through the left femoral neck, with mild rotation of the distal femur. The left femoral head remains seated at the acetabulum. The right hip joint is unremarkable. No significant degenerative change is appreciated. The sacroiliac joints are unremarkable in appearance.  The visualized bowel gas pattern is grossly unremarkable in appearance. Scattered phleboliths are noted within the pelvis.  IMPRESSION: Basicervical fracture through the left femoral neck, with  mild rotation of the distal femur.   Electronically Signed   By: Garald Balding M.D.   On: 03/18/2014 02:25    ASSESSMENT/PLAN:  Left femoral fracture S/P left hemiarthroplasty - for rehabilitation; continue Lovenox 40 mg subcutaneous daily 3 weeks for DVT prophylaxis; Norco 5/325 mg by mouth every 6 hours when necessary for pain; Robaxin 500 mg by mouth every 6 hours when necessary for muscle spasm Anemia -  S/P transfusion 3 units packed RBC; hemoglobin 8.5; monitor hemoglobin Small cell lung cancer with brain metastases - continue Decadron 2 mg by mouth twice a day; follow-up with Dr. Earlie Server, oncologist Protein calorie malnutrition, severe - continue supplementation Chronic hyponatremia - sodium 132; monitor NA Constipation - start Senokot-S 2 tabs by mouth twice a day 3 days then daily at bedtime and MiraLAX 17 g +4-6 ounces liquid by mouth twice a day 3 days then when necessary Hypertension - continue Lopressor 25 mg by mouth twice a day Overactive bladder - continue Myrbetriq 25 mg by mouth daily   Goals of care:  Short-term rehabilitation   Labs/test ordered:  none  Spent 50 minutes in patient care.    [facility], NP [employer] [phone]

## 2014-03-29 ENCOUNTER — Encounter: Payer: Self-pay | Admitting: *Deleted

## 2014-04-01 ENCOUNTER — Telehealth: Payer: Self-pay | Admitting: *Deleted

## 2014-04-01 ENCOUNTER — Telehealth: Payer: Self-pay | Admitting: Internal Medicine

## 2014-04-01 ENCOUNTER — Other Ambulatory Visit: Payer: Medicare Other

## 2014-04-01 NOTE — Telephone Encounter (Signed)
Left message to confirm appointment for 03/18.

## 2014-04-01 NOTE — Telephone Encounter (Signed)
Daughter Geni Bers called regarding change in her Mothers appointment on 04/05/14.  Pt reports her mother fell and had to have a partial hip replacement and is currently staying at a rehab facility.  Daughter is concerned due to there being a 6 hour period of time between appointments.  Contacted Santiago Glad in scheduling to see if we could adjust Dr. Lanell Persons schedule.  Pt appointment moved to 9:30 am on 04/05/14 which will allow for plenty of time for her appointment for Dr. Earlie Server. Called Daughter and left a voicemail regarding this information and to call [phone] for further questions or concerns.

## 2014-04-05 ENCOUNTER — Non-Acute Institutional Stay (SKILLED_NURSING_FACILITY): Payer: Medicare Other | Admitting: Adult Health

## 2014-04-05 ENCOUNTER — Encounter: Payer: Self-pay | Admitting: Adult Health

## 2014-04-05 ENCOUNTER — Telehealth: Payer: Self-pay | Admitting: Medical Oncology

## 2014-04-05 ENCOUNTER — Ambulatory Visit (HOSPITAL_BASED_OUTPATIENT_CLINIC_OR_DEPARTMENT_OTHER): Payer: Medicare Other | Admitting: Internal Medicine

## 2014-04-05 ENCOUNTER — Ambulatory Visit
Admission: RE | Admit: 2014-04-05 | Discharge: 2014-04-05 | Disposition: A | Discharge status: Home / Self Care | Payer: Medicare Other | Source: Ambulatory Visit | Attending: Radiation Oncology | Admitting: Radiation Oncology

## 2014-04-05 ENCOUNTER — Encounter: Payer: Self-pay | Admitting: Medical Oncology

## 2014-04-05 ENCOUNTER — Encounter: Payer: Self-pay | Admitting: Radiation Oncology

## 2014-04-05 VITALS — BP 104/76 | HR 100 | Temp 97.8°F | Resp 20 | Wt 101.0 lb

## 2014-04-05 DIAGNOSIS — C3402 Malignant neoplasm of left main bronchus: Secondary | ICD-10-CM | POA: Diagnosis not present

## 2014-04-05 DIAGNOSIS — I10 Essential (primary) hypertension: Secondary | ICD-10-CM | POA: Diagnosis not present

## 2014-04-05 DIAGNOSIS — S72002D Fracture of unspecified part of neck of left femur, subsequent encounter for closed fracture with routine healing: Secondary | ICD-10-CM

## 2014-04-05 DIAGNOSIS — C7931 Secondary malignant neoplasm of brain: Secondary | ICD-10-CM

## 2014-04-05 DIAGNOSIS — S72002S Fracture of unspecified part of neck of left femur, sequela: Secondary | ICD-10-CM | POA: Diagnosis not present

## 2014-04-05 DIAGNOSIS — D630 Anemia in neoplastic disease: Secondary | ICD-10-CM | POA: Diagnosis not present

## 2014-04-05 DIAGNOSIS — J189 Pneumonia, unspecified organism: Secondary | ICD-10-CM | POA: Diagnosis not present

## 2014-04-05 DIAGNOSIS — N3281 Overactive bladder: Secondary | ICD-10-CM | POA: Diagnosis not present

## 2014-04-05 DIAGNOSIS — C7949 Secondary malignant neoplasm of other parts of nervous system: Secondary | ICD-10-CM

## 2014-04-05 DIAGNOSIS — K5900 Constipation, unspecified: Secondary | ICD-10-CM

## 2014-04-05 DIAGNOSIS — C787 Secondary malignant neoplasm of liver and intrahepatic bile duct: Secondary | ICD-10-CM | POA: Diagnosis not present

## 2014-04-05 DIAGNOSIS — C3491 Malignant neoplasm of unspecified part of right bronchus or lung: Secondary | ICD-10-CM

## 2014-04-05 DIAGNOSIS — E43 Unspecified severe protein-calorie malnutrition: Secondary | ICD-10-CM

## 2014-04-05 DIAGNOSIS — J181 Lobar pneumonia, unspecified organism: Secondary | ICD-10-CM

## 2014-04-05 HISTORY — DX: Cerebral infarction, unspecified: I63.9

## 2014-04-05 NOTE — Telephone Encounter (Signed)
I left a message for referral to [facility].

## 2014-04-05 NOTE — Progress Notes (Signed)
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 Brain metastases 198.3 C79.31     Narrative:  The patient returns today for routine follow-up with her daughter.   She currently resides at [facility], daughter states she will go home tomorrow and "take a break from PT/OT". Pt had CVA on 03/03/14, was admitted and then d/c to [facility] where she fell. She was then admitted, had hemiarthroplasty and was d/c to [facility]. Per her daughter when she c/o pain it is in her upper chest, left upper back and shoulder area. She takes Hydrocodone prn. She is weak, fatigued, recent loss of appetite due to foods at rehab per her daughter. Per Epic she has had 7 lb weight loss in past 10 days. She has appt later today with Dr Julien Nordmann.   When asked what her main goals are, they are to be home, and to rest.   ALLERGIES:  is allergic to asa; codeine; and tramadol.  Meds: Current Outpatient Prescriptions  Medication Sig Dispense Refill  . acetaminophen (TYLENOL) 500 MG tablet Take 1,000 mg by mouth every 6 (six) hours as needed (For headache.).    Marland Kitchen dexamethasone (DECADRON) 2 MG tablet Take 1 tablet (2 mg total) by mouth 2 (two) times daily. 60 tablet 4  . docusate sodium (COLACE) 100 MG capsule Take 1 capsule (100 mg total) by mouth 2 (two) times daily. If  taking pain medications daily. Otherwise take as needed for constipation 60 capsule 0  . enoxaparin (LOVENOX) 40 MG/0.4ML injection Inject 0.4 mLs (40 mg total) into the skin daily. 21 Syringe 0  . feeding supplement, ENSURE COMPLETE, (ENSURE COMPLETE) LIQD Take 237 mLs by mouth 2 (two) times daily between meals.    . feeding supplement, RESOURCE BREEZE, (RESOURCE BREEZE) LIQD Take 1 Container by mouth daily at 3 pm.  0  . GLUCOSAMINE HCL PO Take 1 tablet by mouth daily.    Marland Kitchen HYDROcodone-acetaminophen (NORCO) 5-325 MG per tablet Take 1 tablet by mouth every 6 (six) hours as needed for moderate pain. 30 tablet 0  . lidocaine-prilocaine (EMLA) cream Apply 1 application topically daily as needed (Applies to port-a-cath.). 30 g 0  . methocarbamol (ROBAXIN) 500 MG tablet Take 1 tablet (500 mg total) by mouth every 6 (six) hours as needed for muscle spasms. 60 tablet 0  . metoprolol tartrate (LOPRESSOR) 25 MG tablet Take 1 tablet (25 mg total) by mouth 2 (two) times daily. 60 tablet 0  . mirabegron ER (MYRBETRIQ) 25 MG TB24 tablet Take 25 mg by mouth daily.    . Multiple Vitamin (MULTIVITAMIN WITH MINERALS) TABS Take 1 tablet by mouth daily with lunch. She takes Dance movement psychotherapist Adult 50+.    . ondansetron (ZOFRAN) 8 MG tablet Take 1 tablet (8 mg total) by mouth every 8 (eight) hours as needed  for nausea or vomiting. 20 tablet 0  . polyethylene glycol (MIRALAX / GLYCOLAX) packet Take 17 g by mouth daily as needed.    Marland Kitchen PRESCRIPTION MEDICATION     . prochlorperazine (COMPAZINE) 10 MG tablet Take 1 tablet (10 mg total) by mouth every 6 (six) hours as needed for nausea or vomiting. 30 tablet 0  . senna (SENOKOT) 8.6 MG TABS tablet Take 1 tablet by mouth daily as needed for mild constipation.    . diphenoxylate-atropine (LOMOTIL) 2.5-0.025 MG per tablet Take 1 tablet by mouth 2 (two) times daily as needed for diarrhea or loose stools. (Patient not taking: Reported on 04/05/2014) 60 tablet 1   No current  facility-administered medications for this encounter.   Facility-Administered Medications Ordered in Other Encounters  Medication Dose Route Frequency Provider Last Rate Last Dose  . prochlorperazine (COMPAZINE) tablet 10 mg  10 mg Oral Once Curt Bears, MD      . sodium chloride 0.9 % injection 10 mL  10 mL Intracatheter PRN Curt Bears, MD   10 mL at 01/17/14 1726    Physical Findings:     weight is 101 lb (45.813 kg). Her oral temperature is 97.8 F (36.6 C). Her blood pressure is 104/76 and her pulse is 100. Her respiration is 20 and oxygen saturation is 100%. .  Lungs are grossly CTAB. Heart RRR.  Weak, lying on examination table. ECOG = 4  0 - Asymptomatic (Fully active, able to carry on all predisease activities without restriction)  1 - Symptomatic but completely ambulatory (Restricted in physically strenuous activity but ambulatory and able to carry out work of a light or sedentary nature. For example, light housework, office work)  2 - Symptomatic, <50% in bed during the day (Ambulatory and capable of all self care but unable to carry out any work activities. Up and about more than 50% of waking hours)  3 - Symptomatic, >50% in bed, but not bedbound (Capable of only limited self-care, confined to bed or chair 50% or more of waking hours)  4 - Bedbound (Completely disabled. Cannot carry on any self-care. Totally confined to bed or chair)  5 - Death   Eustace Pen MM, Creech RH, Tormey DC, et al. [phone]). "Toxicity and response criteria of the [facility]". [place] Oncol. 5 (6): 649-55   Lab Findings: Lab Results  Component Value Date   WBC 9.9 03/22/2014   HGB 8.5* 03/22/2014   HCT 24.5* 03/22/2014   MCV 84.2 03/22/2014   PLT 138* 03/22/2014    Lab Results  Component Value Date   TSH 0.712 03/04/2014    Radiographic Findings:  no new images of brain   Impression/Plan:  We had a lengthy discussion today regarding her prognosis and  goals of care. Her ECOG PS is a 4.  She qualifies for hospice and I think that given her main goals of rest and being at home, hospice would be a good option for her.  Our social worker will see her today as well Polo Riley) and she will see Dr Julien Nordmann before any final decision is made.    I wished her the best, and will be available to see her back on a PRN basis.  _____________________________________   Eppie Gibson, MD

## 2014-04-05 NOTE — Progress Notes (Signed)
[place] [phone]   [phone]  OFFICE PROGRESS NOTE   DIAGNOSIS AND STAGE: Extensive stage small cell lung cancer with large obstructing Center left lung mass with large left pleural effusion, liver and brain metastasis diagnosed in June of 2014   PRIOR THERAPY:  1) Status post whole brain irradiation under the care of Dr. Isidore Moos completed 09/11/2012.  2) whole brain irradiation under the care of Dr. Isidore Moos expected to be completed on 09/11/2012.  3) Systemic chemotherapy with carboplatin for AUC of 5 on day 1 and etoposide 120 mg/M2 on days 1, 2 and 3 with Neulasta support on day 4, status post 4 cycles, last cycle was given on 10/11/2012 with partial response.  4) Systemic chemotherapy with cisplatin 30 mg/M2 and irinotecan 65 mg/M2 on days 1 and 8 every 3 weeks, status post 9 cycles. First dose 01/16/2013. Last dose was given 10/18/2013. 5) Temodar 150 mg/M2 on days 1-5 every 4 weeks. She started the first dose of her treatment 11/14/2013. Status post 2 months of treatment discontinued today secondary to disease progression  CURRENT THERAPY:  Systemic chemotherapy again with carboplatin for AUC of 5 on day 1 and etoposide 100 MG/M2 on days 1, 2 and 3 with Neulasta support on day 4. First cycle expected on 01/15/2014. She status post 2 cycles of treatment  CHEMOTHERAPY INTENT: Palliative  CURRENT # OF CHEMOTHERAPY CYCLES: 2 CURRENT ANTIEMETICS: Zofran, dexamethasone and Compazine  CURRENT SMOKING STATUS: Former smoker  ORAL CHEMOTHERAPY AND CONSENT: Temodar 250 mg for 5 days every 4 weeks. First dose 11/14/2013. CURRENT BISPHOSPHONATES USE: None  PAIN MANAGEMENT: 0/10 on Vicodin  NARCOTICS INDUCED CONSTIPATION: None  LIVING WILL AND CODE STATUS: no CODE BLUE   INTERVAL HISTORY: Cheryl Nielsen 74 y.o. female returns to the clinic today for follow up visit accompanied by her daughter Cheryl Nielsen. The patient had several issues happened recently  including questionable new stroke as well as fracture of her left hip after fall. She has been off treatment for several weeks. She came today on a wheelchair. She denied having any significant fever or chills. She denied having any significant chest pain, shortness of breath, cough or hemoptysis. She has no weight loss or night sweats. She is here today for evaluation and discussion of her treatment options.  MEDICAL HISTORY: Past Medical History  Diagnosis Date  . Pneumonia, organism unspecified   . Status post chemotherapy  10/11/2012     carboplatin  . S/P radiation therapy 08/24/2012-09/11/2012    Whole Brain  / 32.5 Gy in 13 fractions  . 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 S/P radiation therapy  07/24/2012-08/10/2012      Mediastinum and Left Hilum / 35 Gy in 14 fractions    . Hypertension   . Hx of radiation therapy 02/25/14- 02/28/14  . Stroke 03/03/14    per daughter     ALLERGIES:  is allergic to asa; codeine; and tramadol.  MEDICATIONS:  Current Outpatient Prescriptions  Medication Sig Dispense Refill  . acetaminophen (TYLENOL) 500 MG tablet Take 1,000 mg by mouth every 6 (six) hours as needed (For headache.).    Marland Kitchen dexamethasone (DECADRON) 2 MG tablet Take 1 tablet (2 mg total) by mouth 2 (two) times daily. 60 tablet 4  . diphenoxylate-atropine (LOMOTIL) 2.5-0.025 MG per tablet Take 1 tablet by mouth 2 (two) times daily as needed for diarrhea or loose stools. 60 tablet 1  .  docusate sodium (COLACE) 100 MG capsule Take 1 capsule (100 mg total) by mouth 2 (two) times daily. If taking pain medications daily. Otherwise take as needed for constipation 60 capsule 0  . enoxaparin (LOVENOX) 40 MG/0.4ML injection Inject 0.4 mLs (40 mg total) into the skin daily. 21 Syringe 0  . feeding supplement, ENSURE COMPLETE, (ENSURE COMPLETE) LIQD Take 237 mLs by mouth 2 (two) times daily between meals.    . feeding supplement, RESOURCE BREEZE, (RESOURCE BREEZE) LIQD  Take 1 Container by mouth daily at 3 pm.  0  . GLUCOSAMINE HCL PO Take 1 tablet by mouth daily.    Marland Kitchen HYDROcodone-acetaminophen (NORCO) 5-325 MG per tablet Take 1 tablet by mouth every 6 (six) hours as needed for moderate pain. 30 tablet 0  . lidocaine-prilocaine (EMLA) cream Apply 1 application topically daily as needed (Applies to port-a-cath.). (Patient not taking: Reported on 04/05/2014) 30 g 0  . methocarbamol (ROBAXIN) 500 MG tablet Take 1 tablet (500 mg total) by mouth every 6 (six) hours as needed for muscle spasms. 60 tablet 0  . metoprolol tartrate (LOPRESSOR) 25 MG tablet Take 1 tablet (25 mg total) by mouth 2 (two) times daily. 60 tablet 0  . mirabegron ER (MYRBETRIQ) 25 MG TB24 tablet Take 25 mg by mouth daily.    . Multiple Vitamin (MULTIVITAMIN WITH MINERALS) TABS Take 1 tablet by mouth daily with lunch. She takes Dance movement psychotherapist Adult 50+.    . ondansetron (ZOFRAN) 8 MG tablet Take 1 tablet (8 mg total) by mouth every 8 (eight) hours as needed for nausea or vomiting. 20 tablet 0  . polyethylene glycol (MIRALAX / GLYCOLAX) packet Take 17 g by mouth daily as needed.    Marland Kitchen PRESCRIPTION MEDICATION     . prochlorperazine (COMPAZINE) 10 MG tablet Take 1 tablet (10 mg total) by mouth every 6 (six) hours as needed for nausea or vomiting. 30 tablet 0  . senna (SENOKOT) 8.6 MG TABS tablet Take 1 tablet by mouth daily as needed for mild constipation.     No current facility-administered medications for this visit.   Facility-Administered Medications Ordered in Other Visits  Medication Dose Route Frequency Provider Last Rate Last Dose  . prochlorperazine (COMPAZINE) tablet 10 mg  10 mg Oral Once Curt Bears, MD      . sodium chloride 0.9 % injection 10 mL  10 mL Intracatheter PRN Curt Bears, MD   10 mL at 01/17/14 1726    SURGICAL HISTORY:  Past Surgical History  Procedure Laterality Date  . Nasal sinus surgery    . Video bronchoscopy Bilateral 07/05/2012    Procedure: VIDEO  BRONCHOSCOPY WITHOUT FLUORO;  Surgeon: Tanda Rockers, MD;  Location: [facility];  Service: Cardiopulmonary;  Laterality: Bilateral;  . Portacath placement    . Hip arthroplasty Left 03/18/2014    Procedure: LEFT HEMI-ARTHROPLASTY HIP;  Surgeon: Nita Sells, MD;  Location: [facility];  Service: Orthopedics;  Laterality: Left;    REVIEW OF SYSTEMS:  Constitutional: positive for fatigue Eyes: negative Ears, nose, mouth, throat, and face: negative Respiratory: positive for dyspnea on exertion Cardiovascular: negative Gastrointestinal: negative Genitourinary:negative Integument/breast: negative Hematologic/lymphatic: negative Musculoskeletal:negative Neurological: negative Behavioral/Psych: negative Endocrine: negative Allergic/Immunologic: negative   PHYSICAL EXAMINATION: General appearance: alert, cooperative, fatigued and no distress Head: Normocephalic, without obvious abnormality, atraumatic Neck: no adenopathy, no JVD, supple, symmetrical, trachea midline and thyroid not enlarged, symmetric, no tenderness/mass/nodules Lymph nodes: Cervical, supraclavicular, and axillary nodes normal. Resp: clear to auscultation bilaterally Back: symmetric, no  curvature. ROM normal. No CVA tenderness. Cardio: regular rate and rhythm, S1, S2 normal, no murmur, click, rub or gallop GI: soft, non-tender; bowel sounds normal; no masses,  no organomegaly Extremities: extremities normal, atraumatic, no cyanosis or edema Neurologic: Alert and oriented X 3, normal strength and tone. Normal symmetric reflexes. Normal coordination and gait  ECOG PERFORMANCE STATUS: 2 - Symptomatic, <50% confined to bed  There were no vitals taken for this visit.  LABORATORY DATA: Lab Results  Component Value Date   WBC 9.9 03/22/2014   HGB 8.5* 03/22/2014   HCT 24.5* 03/22/2014   MCV 84.2 03/22/2014   PLT 138* 03/22/2014      Chemistry      Component Value Date/Time   NA 132* 03/22/2014 0500   NA 137  02/20/2014 1120   K 3.9 03/22/2014 0500   K 4.1 02/20/2014 1120   CL 98 03/22/2014 0500   CL 93* 07/10/2012 1544   CO2 28 03/22/2014 0500   CO2 27 02/20/2014 1120   BUN 12 03/22/2014 0500   BUN 17.1 02/20/2014 1120   CREATININE 0.79 03/22/2014 0500   CREATININE 1.2* 02/20/2014 1120      Component Value Date/Time   CALCIUM 8.3* 03/22/2014 0500   CALCIUM 9.2 02/20/2014 1120   ALKPHOS 88 03/04/2014 0446   ALKPHOS 152* 02/20/2014 1120   AST 26 03/04/2014 0446   AST 21 02/20/2014 1120   ALT 18 03/04/2014 0446   ALT 23 02/20/2014 1120   BILITOT 0.5 03/04/2014 0446   BILITOT <0.20 02/20/2014 1120       RADIOGRAPHIC STUDIES:  Pelvis Portable  03/18/2014   CLINICAL DATA:  Status post left hip fracture  EXAM: PORTABLE PELVIS 1-2 VIEWS  COMPARISON:  None.  FINDINGS: Left unipolar hip arthroplasty.  No fracture or dislocation.  IMPRESSION: No complication following left hip arthroplasty.   Electronically Signed   By: Suzy Bouchard M.D.   On: 03/18/2014 19:00   Dg Chest Port 1 View  03/18/2014   CLINICAL DATA:  Status post fall; concern for chest injury. Initial encounter.  EXAM: PORTABLE CHEST - 1 VIEW  COMPARISON:  Chest radiograph performed 03/07/2014  FINDINGS: Vascular congestion has mildly improved from the prior study. Mild bilateral atelectasis is noted, with persistent elevation of the left hemidiaphragm. Left upper lobe opacity is grossly stable in appearance, reflecting the patient's known malignancy, with left-sided volume loss. No pleural effusion or pneumothorax is seen. Additional left-sided pulmonary nodules are better characterized on recent CT.  The cardiomediastinal silhouette is mildly enlarged. A right-sided chest port is noted ending about the distal SVC. No acute osseous abnormalities are identified.  IMPRESSION: 1. No displaced rib fracture seen. 2. Vascular congestion and mild cardiomegaly. Mild bilateral atelectasis, with persistent elevation of the left hemidiaphragm.  Stable appearance to left upper lobe opacity, reflecting the patient's known malignancy, with left-sided volume loss.   Electronically Signed   By: Garald Balding M.D.   On: 03/18/2014 02:28   Dg Chest Port 1 View  03/07/2014   CLINICAL DATA:  Dyspnea.  Left-sided lung cancer.  EXAM: PORTABLE CHEST - 1 VIEW  COMPARISON:  03/01/2014  FINDINGS: Right sided PowerPort tip overlies the level of superior vena cava. Heart size is enlarged. There is left upper lobe opacity unchanged compared with prior studies. There is left-sided volume loss. Heart size normal. There are no new consolidations or pleural effusions. Stable bronchitic changes.  IMPRESSION: 1. Stable appearance of left upper lobe opacity and left lung volume  loss. 2. Stable bronchitic changes.   Electronically Signed   By: Nolon Nations M.D.   On: 03/07/2014 12:02   Dg Hip Unilat With Pelvis 2-3 Views Left  03/18/2014   CLINICAL DATA:  Status post fall onto hard tile floor. Left hip pain. Initial encounter.  EXAM: LEFT HIP (WITH PELVIS) 2-3 VIEWS  COMPARISON:  None.  FINDINGS: There appears to be a basicervical fracture through the left femoral neck, with mild rotation of the distal femur. The left femoral head remains seated at the acetabulum. The right hip joint is unremarkable. No significant degenerative change is appreciated. The sacroiliac joints are unremarkable in appearance.  The visualized bowel gas pattern is grossly unremarkable in appearance. Scattered phleboliths are noted within the pelvis.  IMPRESSION: Basicervical fracture through the left femoral neck, with mild rotation of the distal femur.   Electronically Signed   By: Garald Balding M.D.   On: 03/18/2014 02:25    ASSESSMENT AND PLAN: this is a very pleasant 74 years old white female with extensive stage small cell lung cancer status post 4 cycles of systemic chemotherapy with carboplatin and etoposide with significant improvement in her disease. She was on observation for few  months and the patient had evidence for disease progression on restaging scan. She was started on second line chemotherapy with cisplatin and irinotecan status post 9 cycles. This was discontinued secondary to disease progression. She was also treated with 2 cycles of oral Temodar but discontinued today secondary to disease progression. She was started on treatment again with carboplatin and etoposide status post 2 cycles. Her treatment is currently on hold after the patient experienced several issues recently including stroke and fracture of the left hip. She is currently on home. The patient and her family are interested in considering palliative care at this point but they are not completely executing treatment in the future once the patient feels better. I recommended for them to proceed with palliative and hospice care but I'll be happy to reevaluate her in the future for treatment if she feels much better. We will make referral to the palliative care and hospice service of [place]. She was advised to call immediately if she has any concerning symptoms in the interval. The patient voices understanding of current disease status and treatment options and is in agreement with the current care plan.  Disclaimer: This note was dictated with voice recognition software. Similar sounding words can inadvertently be transcribed and may not be corrected upon review.

## 2014-04-05 NOTE — Progress Notes (Addendum)
Patient currently resides at [facility], daughter states she will go home tomorrow and "take a break from PT/OT". Pt had CVA on 03/03/14, was admitted and then d/c to [facility] where she fell. She was then admitted, had hemiarthroplasty and was d/c to [facility]. Per her daughter when she c/o pain it is in her upper chest, left upper back and shoulder area. She takes Hydrocodone prn. Pt is weak, fatigued, recent loss of appetite due to foods at rehab per her daughter. Per Epic she has had 7 lb weight loss in past 10 days. She has appt later today with Dr Julien Nordmann.   BP 104/76 mmHg  Pulse 100  Temp(Src) 97.8 F (36.6 C) (Oral)  Resp 20  Wt 101 lb (45.813 kg)  SpO2 100%

## 2014-04-05 NOTE — Progress Notes (Signed)
Patient ID: Cheryl Nielsen, female   DOB: 12-Mar-1940, 74 y.o.   MRN: [id number]   04/05/14  Facility:  Nursing Home Location:  [place] Room Number: 408-P LEVEL OF CARE:  SNF (31)   Chief Complaint  Patient presents with  . Discharge Note    Left femoral fracture S/P left hemiarthroplasty, anemia, Pneumonia, small cell lung cancer, protein calorie malnutrition, chronic hyponatremia and constipation    HISTORY OF PRESENT ILLNESS:  This is a 74 year old female who is for discharge home with hospice. DME: Hospital bed, standard wheelchair, over-the-bed rolling tray and 3-in-1 bedside commode. She has been admitted to [facility] on 03/22/14 from [facility]. 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 She had a fall sustaining left femoral fracture. She had left hemi-arthroplasty on 03/18/14.   Patient was admitted to this facility for short-term rehabilitation. Patient was seen to day by oncologist. She is too weak @ this time to resume chemotherapy. She will be discharged to home with hospice care.  PAST MEDICAL HISTORY:  Past Medical History  Diagnosis Date  . Pneumonia, organism unspecified   . Status post chemotherapy  10/11/2012     carboplatin  . S/P radiation therapy 08/24/2012-09/11/2012    Whole Brain  / 32.5 Gy in 13 fractions  . 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 S/P radiation therapy  07/24/2012-08/10/2012      Mediastinum and Left Hilum / 35 Gy in 14 fractions    . Hypertension   . Hx of radiation therapy 02/25/14- 02/28/14  . Stroke 03/03/14    per daughter     CURRENT MEDICATIONS: Reviewed per MAR/see medication list  Allergies  Allergen Reactions  . Asa [Aspirin]     GI upset  . Codeine     syncope  . Tramadol Nausea And Vomiting     REVIEW OF SYSTEMS:  GENERAL: no  fever,  chills RESPIRATORY: no cough, wheezing, hemoptysis CARDIAC: no edema or palpitations GI: no abdominal pain, diarrhea, heart burn, nausea or vomiting  PHYSICAL EXAMINATION  GENERAL: no acute distress, normal body habitus NECK: supple, trachea midline, no neck masses, no thyroid tenderness, no thyromegaly LYMPHATICS: no LAN in the neck, no supraclavicular LAN RESPIRATORY: breathing is even & unlabored, BS CTAB CARDIAC: RRR, no murmur,no extra heart sounds, no edema GI: abdomen soft, normal BS, no masses, no tenderness, no hepatomegaly, no splenomegaly EXTREMITIES: Able to move 4 extremities PSYCHIATRIC: the patient is alert & oriented to person, affect & behavior appropriate  LABS/RADIOLOGY: 04/03/14  WBC 12.6 hemoglobin 11.4 hematocrit 34.2 MCV 86.6 sodium 128 potassium 4.5 glucose 73 BUN 27 creatinine 0.83 calcium 8.9 04/02/14  bilateral chest x-ray shows mild patchy density in the left upper lung compatible with pneumonia, COPD, mild osteoporosis and degenerative arthritis Labs reviewed: Basic Metabolic Panel:  Recent Labs  01/08/14 1458 01/15/14 1438  03/04/14 0446  03/19/14 0610 03/20/14 0531 03/22/14 0500  NA 136 135*  < > 126*  < > 130* 128* 132*  K 4.2 4.2  < > 3.6  < > 5.0 3.5 3.9  CL  --   --   < > 93*  < > 97 99 98  CO2 29 27  < > 23  < > 24 23 28   GLUCOSE 112 175*  < > 134*  < > 120* 88  106*  BUN 25.5 22.6  < > 23  < > 19 14 12   CREATININE 1.7* 1.5*  < > 1.02  < > 1.09 0.83 0.79  CALCIUM 9.6 9.6  < > 8.7  < > 8.0* 7.9* 8.3*  MG 2.4 2.4  --  1.6  --   --   --   --   PHOS  --   --   --  4.1  --   --   --   --   < > = values in this interval not displayed. Liver Function Tests:  Recent Labs  03/02/14 0127 03/03/14 2211 03/04/14 0446  AST 28 30 26   ALT 18 46* 18  ALKPHOS 104 109 88  BILITOT 0.3 0.7 0.5  PROT 6.3 7.3 6.5  ALBUMIN 3.6 4.1 3.6     Recent Labs  03/03/14 2212  AMMONIA 13   CBC:  Recent Labs  02/20/14 1120  03/03/14 2211   03/18/14 0142  03/20/14 0531 03/21/14 0500 03/21/14 2100 03/22/14 0500  WBC 11.7*  < > 6.6  < > 12.9*  < > 9.3 10.2  --  9.9  NEUTROABS 9.8*  --  6.1  --  11.3*  --   --   --   --   --   HGB 11.1*  < > 12.8  < > 12.4  < > 8.5* 7.2* 8.5* 8.5*  HCT 34.5*  < > 37.2  < > 37.2  < > 24.2* 21.0* 24.3* 24.5*  MCV 92.8  < > 88.8  < > 91.4  < > 85.8 86.8  --  84.2  PLT 152  < > 391  < > 265  < > 121* 126*  --  138*  < > = values in this interval not displayed.  Cardiac Enzymes:  Recent Labs  03/01/14 1954 03/02/14 0127 03/02/14 0709  TROPONINI <0.03 <0.03 <0.03    Pelvis Portable  03/18/2014   CLINICAL DATA:  Status post left hip fracture  EXAM: PORTABLE PELVIS 1-2 VIEWS  COMPARISON:  None.  FINDINGS: Left unipolar hip arthroplasty.  No fracture or dislocation.  IMPRESSION: No complication following left hip arthroplasty.   Electronically Signed   By: Suzy Bouchard M.D.   On: 03/18/2014 19:00   Dg Chest Port 1 View  03/18/2014   CLINICAL DATA:  Status post fall; concern for chest injury. Initial encounter.  EXAM: PORTABLE CHEST - 1 VIEW  COMPARISON:  Chest radiograph performed 03/07/2014  FINDINGS: Vascular congestion has mildly improved from the prior study. Mild bilateral atelectasis is noted, with persistent elevation of the left hemidiaphragm. Left upper lobe opacity is grossly stable in appearance, reflecting the patient's known malignancy, with left-sided volume loss. No pleural effusion or pneumothorax is seen. Additional left-sided pulmonary nodules are better characterized on recent CT.  The cardiomediastinal silhouette is mildly enlarged. A right-sided chest port is noted ending about the distal SVC. No acute osseous abnormalities are identified.  IMPRESSION: 1. No displaced rib fracture seen. 2. Vascular congestion and mild cardiomegaly. Mild bilateral atelectasis, with persistent elevation of the left hemidiaphragm. Stable appearance to left upper lobe opacity, reflecting the  patient's known malignancy, with left-sided volume loss.   Electronically Signed   By: Garald Balding M.D.   On: 03/18/2014 02:28   Dg Chest Port 1 View  03/07/2014   CLINICAL DATA:  Dyspnea.  Left-sided lung cancer.  EXAM: PORTABLE CHEST - 1 VIEW  COMPARISON:  03/01/2014  FINDINGS: Right sided PowerPort  tip overlies the level of superior vena cava. Heart size is enlarged. There is left upper lobe opacity unchanged compared with prior studies. There is left-sided volume loss. Heart size normal. There are no new consolidations or pleural effusions. Stable bronchitic changes.  IMPRESSION: 1. Stable appearance of left upper lobe opacity and left lung volume loss. 2. Stable bronchitic changes.   Electronically Signed   By: Nolon Nations M.D.   On: 03/07/2014 12:02   Dg Hip Unilat With Pelvis 2-3 Views Left  03/18/2014   CLINICAL DATA:  Status post fall onto hard tile floor. Left hip pain. Initial encounter.  EXAM: LEFT HIP (WITH PELVIS) 2-3 VIEWS  COMPARISON:  None.  FINDINGS: There appears to be a basicervical fracture through the left femoral neck, with mild rotation of the distal femur. The left femoral head remains seated at the acetabulum. The right hip joint is unremarkable. No significant degenerative change is appreciated. The sacroiliac joints are unremarkable in appearance.  The visualized bowel gas pattern is grossly unremarkable in appearance. Scattered phleboliths are noted within the pelvis.  IMPRESSION: Basicervical fracture through the left femoral neck, with mild rotation of the distal femur.   Electronically Signed   By: Garald Balding M.D.   On: 03/18/2014 02:25    ASSESSMENT/PLAN:  Left femoral fracture S/P left hemiarthroplasty -  continue Lovenox 40 mg subcutaneous daily 6 more days for DVT prophylaxis; Norco 5/325 mg by mouth every 6 hours when necessary for pain; Robaxin 500 mg by mouth every 6 hours when necessary for muscle spasm Anemia -  S/P transfusion 3 units packed RBC;  hemoglobin 11.4; monitor hemoglobin Small cell lung cancer with brain metastases - continue Decadron 2 mg by mouth twice a day; for hospice care for now and will follow-up with oncology as needed Protein calorie malnutrition, severe - continue supplementation Chronic hyponatremia - sodium 128 Constipation - continue Senokot-S 2 tabs by mouth daily at bedtime and MiraLAX 17 g +4-6 ounces liquid by mouth daily when necessary Hypertension - well controlled ; continue Lopressor 25 mg by mouth twice a day Overactive bladder - continue Myrbetriq 25 mg by mouth daily Pneumonia - continue Avelox 400 mg 1 by mouth daily 5 more days     I have filled out patient's discharge paperwork and written prescriptions.  Patient will have Hospice care.  DME provided: Hospital bed, standard wheelchair, over-the-bed rolling tray and 3-in-1 bedside commode  Total discharge time: Greater than 30 minutes  Discharge time involved coordination of the discharge process with social worker, nursing staff and therapy department.      [facility], NP [employer] [phone]

## 2014-04-07 ENCOUNTER — Encounter: Payer: Self-pay | Admitting: Internal Medicine

## 2014-04-07 DIAGNOSIS — S72002A Fracture of unspecified part of neck of left femur, initial encounter for closed fracture: Secondary | ICD-10-CM | POA: Insufficient documentation

## 2014-04-08 ENCOUNTER — Encounter: Payer: Self-pay | Admitting: Internal Medicine

## 2014-04-08 ENCOUNTER — Other Ambulatory Visit: Payer: Medicare Other

## 2014-04-08 ENCOUNTER — Ambulatory Visit: Payer: Medicare Other

## 2014-04-09 ENCOUNTER — Telehealth: Payer: Self-pay | Admitting: *Deleted

## 2014-04-09 ENCOUNTER — Ambulatory Visit: Payer: Medicare Other

## 2014-04-09 NOTE — Telephone Encounter (Signed)
1) PT. HAS REQUESTED A DNR IN THE HOME. VERBAL ORDER FROM DR.MOHAMED AND THE HOSPICE PHYSICIAN WILL SIGN. 2) PT.'S DAUGHTER REQUESTED DECADRON 2MG  TWICE A DAY BE INCREASED TO 4MG  TWICE A DAY. PT. HAS LOVENOX 40MG /0.4ML IN THE HOME. IS THIS MEDICATION RELATED TO THE HOSPICE DIAGNOSIS AND SHOULD THIS MEDICATION BE CONTINUED? 4) PT. HAS A COVER OVER HER PORT A CATH. IS THIS NECESSARY? 5) WHEN WAS THE LAST TIME PT.'S PORT A CATH WAS ACCESSED? THIS NOTE WAS ROUTED TO DR.MOHAMED AND DIANE BELL,RN.

## 2014-04-09 NOTE — Telephone Encounter (Signed)
I called [name] and left message that it is okay for DNR per facility provider to sign. Port last accessed approx 2/19. I suggested [name] contact orthopedist regarding decadron dose increase and lovenox.question.

## 2014-04-09 NOTE — Telephone Encounter (Signed)
It is okay for her to increase her Decadron to 4 mg by mouth twice a day. Lovenox question would be answered by the orthopedic surgeon.

## 2014-04-10 ENCOUNTER — Ambulatory Visit: Payer: Medicare Other

## 2014-04-11 ENCOUNTER — Ambulatory Visit: Payer: Medicare Other

## 2014-04-11 NOTE — Telephone Encounter (Signed)
SPOKE TO [name] WITH HOSPICE. DR.FELDMAN ORDERED O2 AT TWO LITERS PRN VIA NASAL CANNULA. DR.FELDMAN ALSO INCREASED DECADRON TO 4MG  TWICE A DAY.

## 2014-04-12 ENCOUNTER — Ambulatory Visit: Admission: RE | Admit: 2014-04-12 | Payer: Medicare Other | Source: Ambulatory Visit | Admitting: Radiation Oncology

## 2014-04-12 ENCOUNTER — Ambulatory Visit: Payer: Medicare Other | Admitting: Radiation Oncology

## 2014-04-18 ENCOUNTER — Telehealth: Payer: Self-pay | Admitting: *Deleted

## 2014-04-18 NOTE — Telephone Encounter (Signed)
Cheryl Melnick, RN with [facility] called to say that Cheryl Nielsen is having a rapid decline.  She is bed bound , skin temp alternating between hot and cool, eating bites only, sleeping 90% of time, voice is a whisper.  She is having dysphagia and has been Norco switched to Roxynol 20 mg/ml, 5-10mg  q 4 hours prn pain, breathing difficulty. They have encouraged decadron as long as possible.

## 2014-04-22 ENCOUNTER — Telehealth: Payer: Self-pay | Admitting: *Deleted

## 2014-04-22 NOTE — Telephone Encounter (Signed)
[name] RN called reporting "patient is actively dying exhibiting change in color, temperature, O2 sats have dropped and liquid morphine has been started today

## 2014-04-24 ENCOUNTER — Telehealth: Payer: Self-pay | Admitting: *Deleted

## 2014-04-25 ENCOUNTER — Telehealth: Payer: Self-pay | Admitting: Internal Medicine

## 2014-04-25 NOTE — Telephone Encounter (Signed)
DEATH CERTIFICATE RECEIVED.

## 2014-05-19 NOTE — Telephone Encounter (Signed)
Hospice called to report Cheryl Nielsen expired today at [phone].  She was very comfortable and died peacefully.  Will notify provider and managed Care of this event.

## 2014-05-19 DEATH — deceased

## 2016-01-16 ENCOUNTER — Other Ambulatory Visit: Payer: Self-pay | Admitting: Nurse Practitioner

## 2016-10-17 IMAGING — CT CT ABD-PELV W/O CM
2 of 4 series · 16 of 46 positions shown, 18 images · non-contrast
Comparison: CT scan of January 04, 2014.

CLINICAL DATA: Current history of left-sided small-cell lung
cancer.

EXAM:
CT CHEST, ABDOMEN AND PELVIS WITHOUT CONTRAST
TECHNIQUE: Multidetector CT imaging of the chest, abdomen and pelvis was
performed following the standard protocol without IV contrast.

[Series 2: cap w/o w/o st · axial · non-contrast · 0.63mm/px · z∈[-553,-53]mm · 13 of 116 slices shown, 15 images]
[im 8/116  soft-tissue]
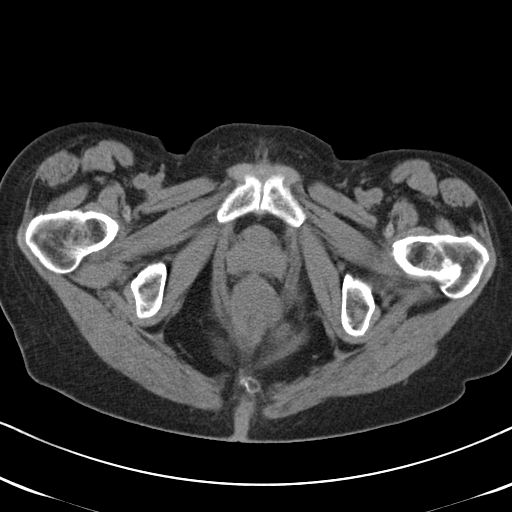
[im 8/116  bone]
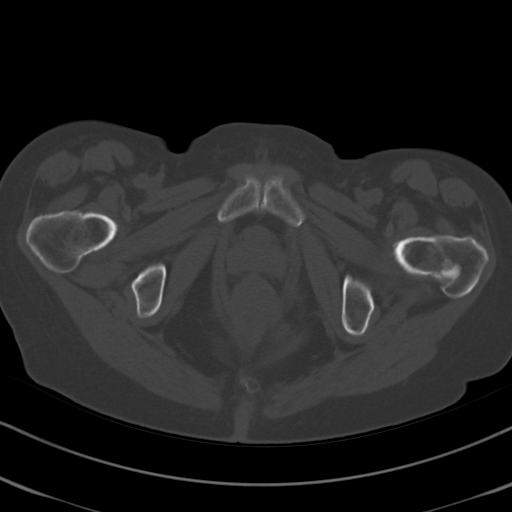
[im 16/116  soft-tissue]
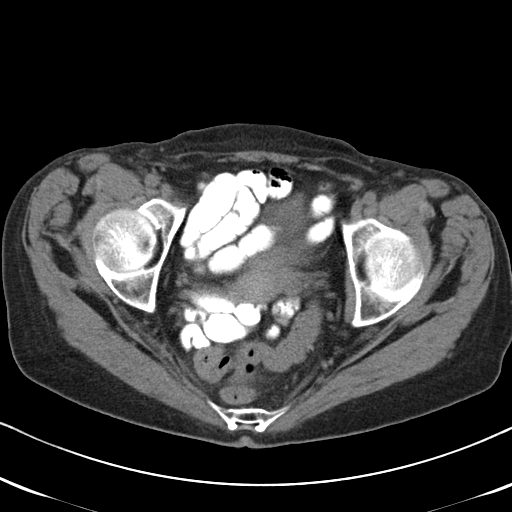
[im 24/116  soft-tissue]
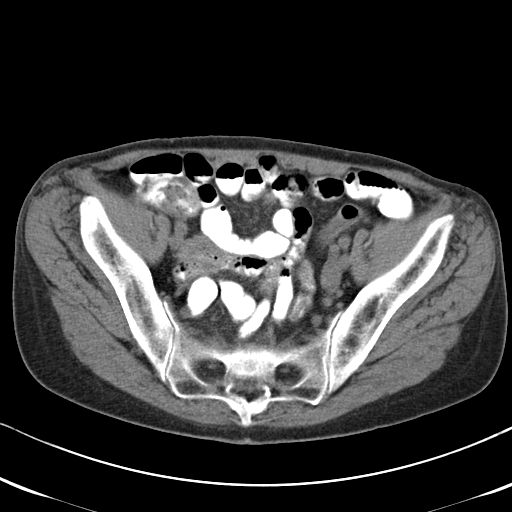
[im 31/116  soft-tissue]
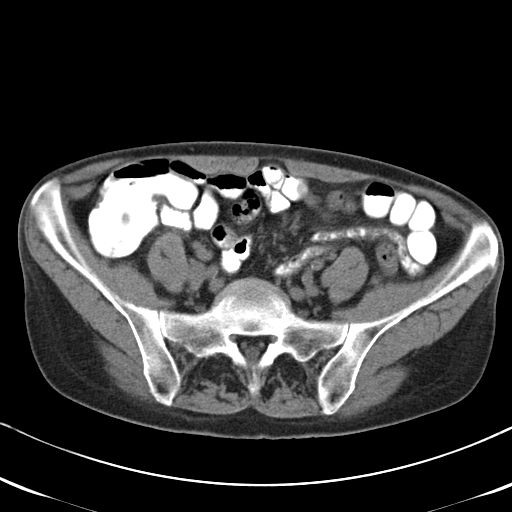
[im 39/116  soft-tissue]
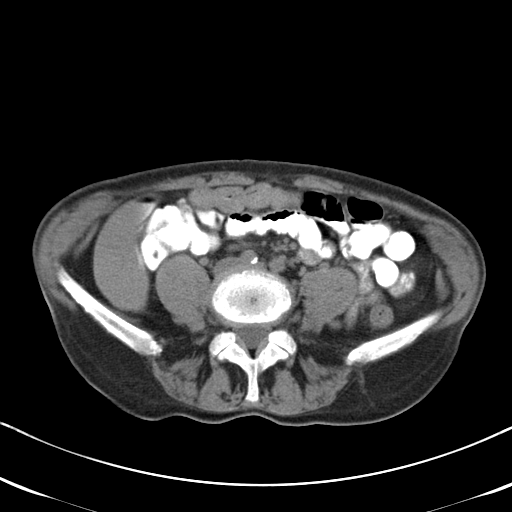
[im 47/116  soft-tissue]
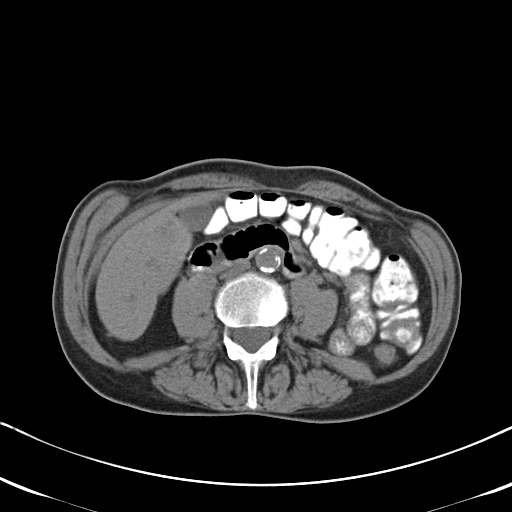
[im 62/116  soft-tissue]
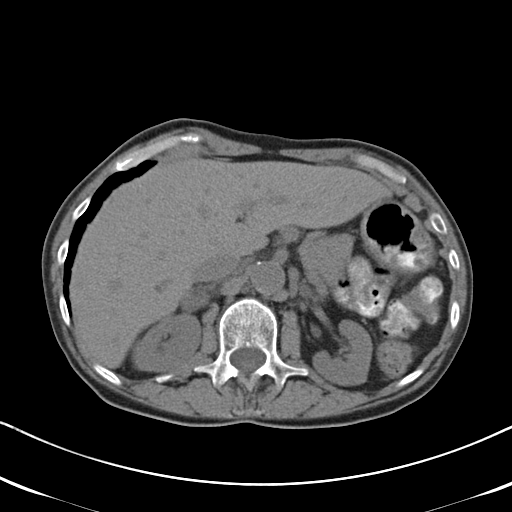
[im 70/116  soft-tissue]
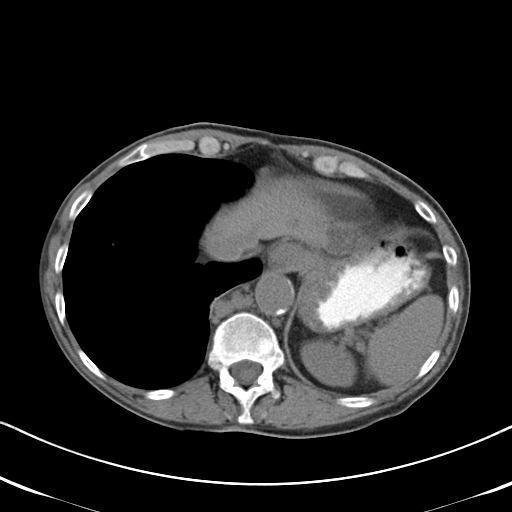
[im 77/116  soft-tissue]
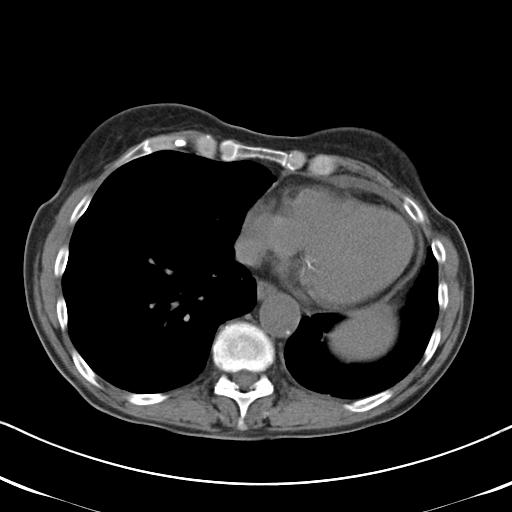
[im 77/116  bone]
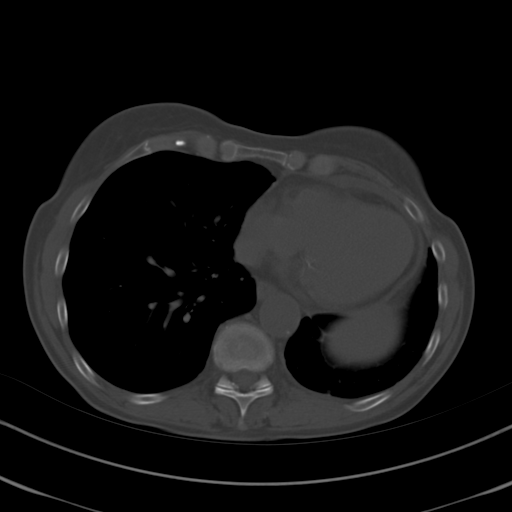
[im 85/116  soft-tissue]
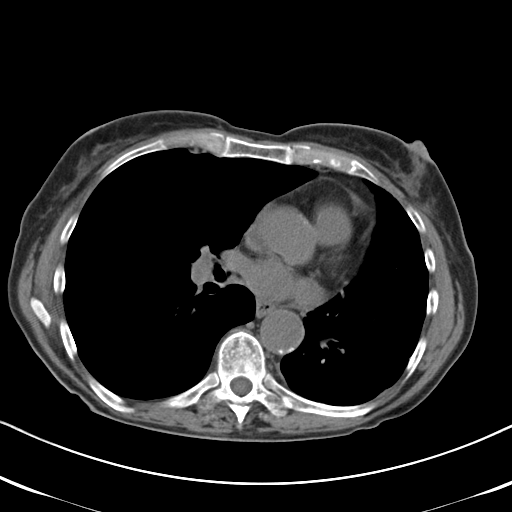
[im 93/116  soft-tissue]
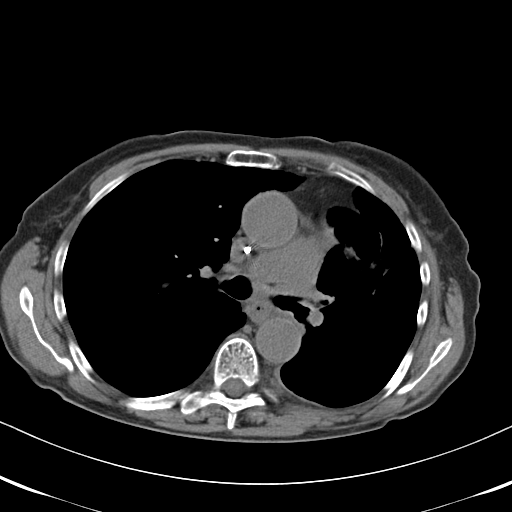
[im 100/116  soft-tissue]
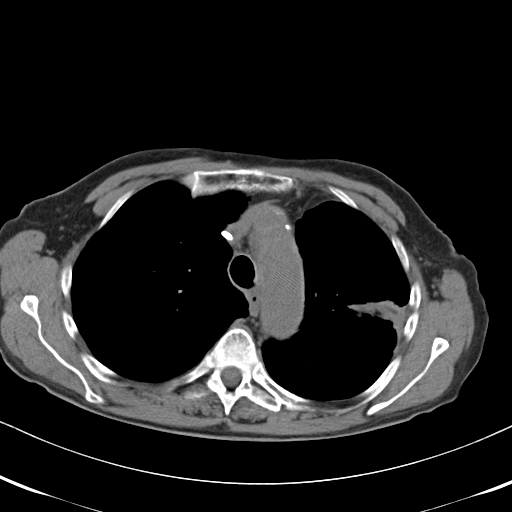
[im 108/116  soft-tissue]
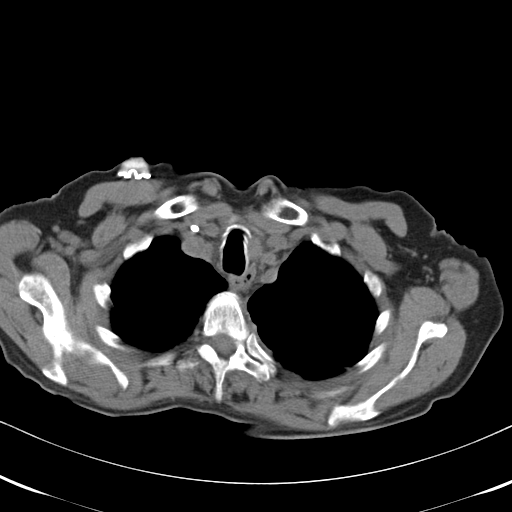

[Series 602: <mpr thick range> · coronal · 1.13mm/px · 3 of 92 slices shown]
[im 31/92  soft-tissue]
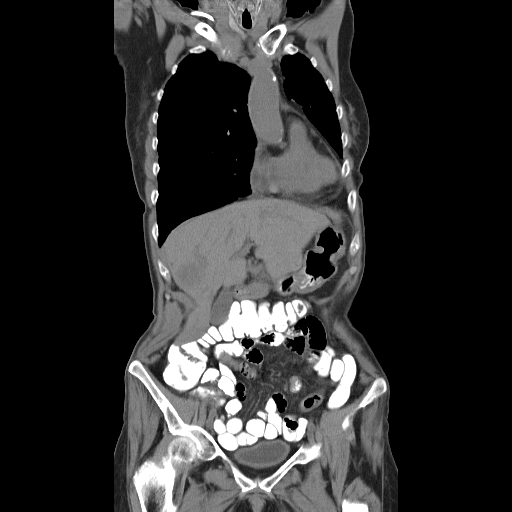
[im 41/92  soft-tissue]
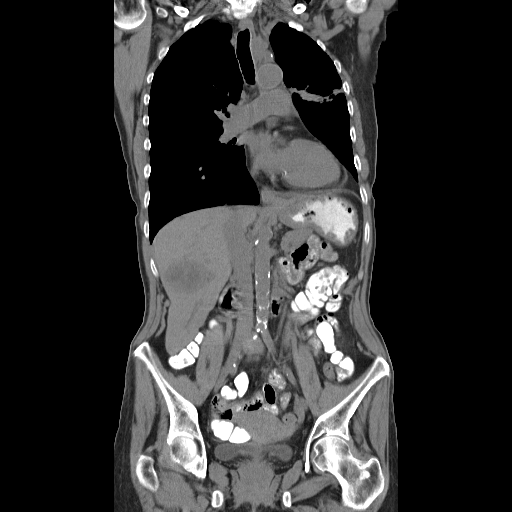
[im 51/92  soft-tissue]
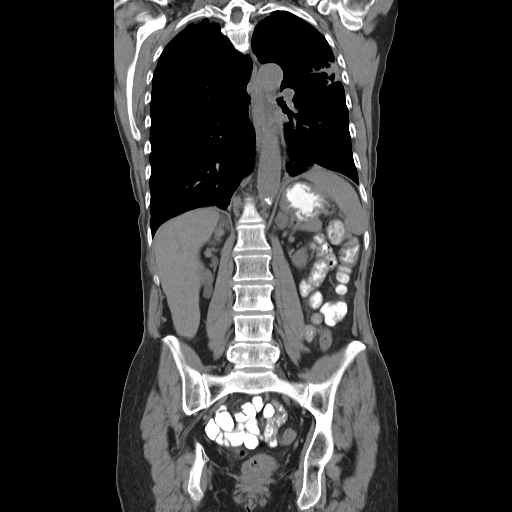

[16 of 46 positions shown; findings below may reference images not displayed]

FINDINGS: CT CHEST FINDINGS

No pneumothorax or significant pleural effusion is noted. Irregular
mass density measuring 25 x 14 mm is noted laterally in the left
upper lobe with adjacent 18 x 15 mm pleural-based abnormality. These
are not significantly changed compared to prior exam. Stable 10 x 7
mm lingular nodule is noted. Stable scarring or post radiation
changes are noted in the left upper lobe. Stable right apical
scarring is noted. Stable small left lower lobe nodules are noted.
No new nodules or masses are seen.

Anterior superior mediastinal adenopathy measuring 4.4 x 2.5 cm is
noted which is not significantly changed compared to prior exam.
Right internal jugular Port-A-Cath is noted with distal tip at the
cavoatrial junction. Minimal pericardial effusion is noted.
Sclerotic densities are noted in the T8 and T9 vertebral bodies
which are unchanged compared to prior exam and consistent with
metastatic disease.

CT ABDOMEN AND PELVIS FINDINGS

Large right hepatic metastatic lesion is again noted measuring 6.0 x
5.5 cm in size, not significantly changed compared to prior exam. No
gallstones are noted. Spleen and pancreas appear normal. Kidneys
appear normal. No hydronephrosis or renal obstruction is noted.
Stable bilateral adrenal masses are noted consistent with metastatic
disease. Mild atherosclerotic calcifications of abdominal aorta are
noted without aneurysm formation. The appendix appears normal. There
is no evidence of bowel obstruction. No abnormal fluid collection is
noted. Urinary bladder and uterus appear normal. Ovaries appear
normal. No significant adenopathy is noted. No significant
adenopathy is noted.

Stable sclerotic lesions are noted in the proximal right femur,
right superior acetabulum, right sacrum and left acetabulum compared
to prior exam consistent with metastatic disease.
IMPRESSION: Stable left upper lobe lung masses are noted consistent with
malignancy. Stable mediastinal adenopathy is noted. Stable lingular
nodule is noted as well as several left lower lobe nodules.

Stable large right hepatic metastatic lesion.

Stable bilateral adrenal masses consistent with metastatic disease.

Stable osseous metastases as described above.
